# Patient Record
Sex: Female | Born: 1995 | Hispanic: Yes | Marital: Single | Smoking: Never smoker
Health system: Southern US, Community
[De-identification: ages and names within clinical notes are randomized; demographics above are authoritative.]

## PROBLEM LIST (undated history)

## (undated) ENCOUNTER — Inpatient Hospital Stay (HOSPITAL_COMMUNITY): Payer: Self-pay

## (undated) DIAGNOSIS — F209 Schizophrenia, unspecified: Secondary | ICD-10-CM

## (undated) DIAGNOSIS — E119 Type 2 diabetes mellitus without complications: Secondary | ICD-10-CM

## (undated) DIAGNOSIS — F32A Depression, unspecified: Secondary | ICD-10-CM

## (undated) DIAGNOSIS — J45909 Unspecified asthma, uncomplicated: Secondary | ICD-10-CM

## (undated) DIAGNOSIS — F4481 Dissociative identity disorder: Secondary | ICD-10-CM

## (undated) DIAGNOSIS — F419 Anxiety disorder, unspecified: Secondary | ICD-10-CM

## (undated) DIAGNOSIS — R519 Headache, unspecified: Secondary | ICD-10-CM

## (undated) DIAGNOSIS — L509 Urticaria, unspecified: Secondary | ICD-10-CM

## (undated) DIAGNOSIS — F329 Major depressive disorder, single episode, unspecified: Secondary | ICD-10-CM

## (undated) HISTORY — DX: Depression, unspecified: F32.A

## (undated) HISTORY — DX: Schizophrenia, unspecified: F20.9

## (undated) HISTORY — DX: Dissociative identity disorder: F44.81

## (undated) HISTORY — PX: WISDOM TOOTH EXTRACTION: SHX21

## (undated) HISTORY — DX: Urticaria, unspecified: L50.9

## (undated) HISTORY — PX: NO PAST SURGERIES: SHX2092

---
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
## 2018-11-17 ENCOUNTER — Other Ambulatory Visit: Payer: Self-pay

## 2018-11-17 ENCOUNTER — Encounter (HOSPITAL_COMMUNITY): Payer: Self-pay | Admitting: *Deleted

## 2018-11-17 ENCOUNTER — Emergency Department (HOSPITAL_COMMUNITY)
Admission: EM | Admit: 2018-11-17 | Discharge: 2018-11-17 | Disposition: A | Discharge status: Home / Self Care | Payer: Medicaid Other | Attending: Emergency Medicine | Admitting: Emergency Medicine

## 2018-11-17 DIAGNOSIS — E119 Type 2 diabetes mellitus without complications: Secondary | ICD-10-CM | POA: Insufficient documentation

## 2018-11-17 DIAGNOSIS — H60332 Swimmer's ear, left ear: Secondary | ICD-10-CM | POA: Diagnosis not present

## 2018-11-17 DIAGNOSIS — H9202 Otalgia, left ear: Secondary | ICD-10-CM | POA: Diagnosis present

## 2018-11-17 HISTORY — DX: Type 2 diabetes mellitus without complications: E11.9

## 2018-11-17 MED ORDER — OFLOXACIN 0.3 % OP SOLN
5.0000 [drp] | Freq: Every day | OPHTHALMIC | Status: DC
  Administered 2018-11-17: 5 [drp] via OTIC
  Filled 2018-11-17: qty 5

## 2018-11-17 MED ORDER — OFLOXACIN 0.3 % OP SOLN
5.0000 [drp] | Freq: Every day | OPHTHALMIC | Status: DC
  Filled 2018-11-17: qty 5

## 2018-11-17 NOTE — Discharge Instructions (Signed)
Use use 5 drops of ofloxacin in affected ear twice a day for the next 7 days. Use tylenol and ibuprofen for pain.

## 2018-11-17 NOTE — ED Triage Notes (Signed)
Left ear pain for several days.  

## 2018-11-17 NOTE — ED Provider Notes (Signed)
[facility] EMERGENCY DEPARTMENT Provider Note   CSN: [id number] Arrival date & time: 11/17/18  2120     History   Chief Complaint Chief Complaint  Patient presents with  . Otalgia    HPI Melissa Gilmore is a 23 y.o. female history of diabetes presents for 5 days of worsening left ear pain with decreased hearing and jaw pain.  Patient denies any otorrhea but states it hurts to touch her ear 2 days.  Patient states pain is a 6/10.  States this is more painful than prior cases of otitis externa but feels similar in quality.  Patient states pain is sharp and pulsatile in character worse with touching her ear and states that it hurts in the front of her ear as well.  Patient denies any discharge from your ear, recent swimming, Q-tip usage.  States she has had ear infections in the past that required eardrops felt similar.  Denies any recent antibiotic usage.  States her blood sugar is well controlled with metformin and she checked this morning blood sugar was 95.   Patient denies any fevers, chills, headache, pain behind her ear, nausea or vomiting.     HPI  Past Medical History:  Diagnosis Date  . Diabetes mellitus without complication ([place])     There are no active problems to display for this patient.   History reviewed. No pertinent surgical history.   OB History   No obstetric history on file.      Home Medications    Prior to Admission medications   Not on File    Family History No family history on file.  Social History Social History   Tobacco Use  . Smoking status: Never Smoker  Substance Use Topics  . Alcohol use: Not Currently  . Drug use: Not Currently     Allergies   Patient has no allergy information on record.   Review of Systems Review of Systems  All other systems reviewed and are negative.    Physical Exam Updated Vital Signs BP (!) 138/100   Pulse 85   Temp 99.1 F (37.3 C) (Oral)   Resp 20   SpO2 99%    Physical Exam Vitals signs and nursing note reviewed.  Constitutional:      General: She is not in acute distress. HENT:     Head: Normocephalic and atraumatic.     Right Ear: Tympanic membrane, ear canal and external ear normal.     Ears:     Comments: Left ear canal with edema, macerated skin and debris present.  TM intact without erythema.   Patient has tragal and pinna tenderness.  No mastoid tenderness to palpation or percussion.  No pre-or post auricular lymphadenopathy no cervical or supraclavicular lymphadenopathy.    Nose: Nose normal. No congestion.     Mouth/Throat:     Mouth: Mucous membranes are moist.     Pharynx: No oropharyngeal exudate or posterior oropharyngeal erythema.  Eyes:     General: No scleral icterus.       Right eye: No discharge.        Left eye: No discharge.     Conjunctiva/sclera: Conjunctivae normal.  Cardiovascular:     Rate and Rhythm: Normal rate and regular rhythm.  Pulmonary:     Effort: Pulmonary effort is normal.     Breath sounds: Normal breath sounds.  Abdominal:     Palpations: Abdomen is soft.     Tenderness: There is no abdominal tenderness.  Lymphadenopathy:     Cervical: No cervical adenopathy.  Skin:    General: Skin is warm and dry.  Neurological:     Mental Status: She is alert. Mental status is at baseline.  Psychiatric:        Mood and Affect: Mood normal.        Behavior: Behavior normal.      ED Treatments / Results  Labs (all labs ordered are listed, but only abnormal results are displayed) Labs Reviewed - No data to display  EKG None  Radiology No results found.  Procedures Procedures (including critical care time)  Medications Ordered in ED Medications - No data to display   Initial Impression / Assessment and Plan / ED Course  I have reviewed the triage vital signs and the nursing notes.  Pertinent labs & imaging results that were available during my care of the patient were reviewed by me and  considered in my medical decision making (see chart for details).       Patient is 23 year old female with history of diabetes presenting for left ear pain.  Patient has acute otitis externa of the left ear placed ear wick and administered ofloxacin eardrops.  Prescribed ofloxacin 5 drops twice daily for the next 7 days instructed use Tylenol ibuprofen for pain.  Also patient on avoiding Q-tips and swimming for the next week.  Ear hygiene discussed.  Patient does not appear to have malignant otitis externa or systemic symptoms of infection.  Patient will follow-up with primary care in the next week.  Will return for any new or concerning symptoms discussed strict return precautions patient understands nature of disease all is answered.      The patient appears reasonably screened and/or stabilized for discharge and I doubt any other medical condition or other [facility] requiring further screening, evaluation, or treatment in the ED at this time prior to discharge.  Patient is hemodynamically stable, in NAD, and able to ambulate in the ED. Pain has been managed or a plan has been made for home management and has no complaints prior to discharge. Patient is comfortable with above plan and is stable for discharge at this time. All questions were answered prior to disposition. Results from the ER workup discussed with the patient face to face and all questions answered to the best of my ability. The patient is safe for discharge with strict return precautions. Patient appears safe for discharge with appropriate follow-up.  Conveyed my impression with the patient and he voiced understanding and is agreeable to plan.   An After Visit Summary was printed and given to the patient.  Portions of this note were generated with Scientist, clinical (histocompatibility and immunogenetics). Dictation errors may occur despite best attempts at proofreading.    Final Clinical Impressions(s) / ED Diagnoses   Final diagnoses:  None    ED Discharge  Orders    None       Gailen Shelter, [place] 11/17/18 2319    Virgina Norfolk, DO 11/18/18 0007

## 2018-11-28 ENCOUNTER — Other Ambulatory Visit: Payer: Self-pay | Admitting: Cardiology

## 2018-11-28 DIAGNOSIS — Z20822 Contact with and (suspected) exposure to covid-19: Secondary | ICD-10-CM

## 2018-11-30 LAB — NOVEL CORONAVIRUS, NAA: SARS-CoV-2, NAA: DETECTED — AB

## 2018-12-02 ENCOUNTER — Telehealth: Payer: Self-pay | Admitting: Critical Care Medicine

## 2018-12-02 NOTE — Telephone Encounter (Signed)
I connected with this patient who is having only symptoms of fatigue.  The symptoms began on 21 October which is when she had her Covid test.  The patient understands she must stay in isolation through October 31.  She knows the health department may be in touch with her.  She had no other questions or concerns

## 2019-07-02 ENCOUNTER — Other Ambulatory Visit: Payer: Self-pay

## 2019-07-02 ENCOUNTER — Encounter: Payer: Self-pay | Admitting: Nurse Practitioner

## 2019-07-02 ENCOUNTER — Ambulatory Visit: Payer: Medicaid Other | Attending: Nurse Practitioner | Admitting: Nurse Practitioner

## 2019-07-02 VITALS — Ht 64.0 in | Wt 220.0 lb

## 2019-07-02 DIAGNOSIS — Z7984 Long term (current) use of oral hypoglycemic drugs: Secondary | ICD-10-CM | POA: Diagnosis not present

## 2019-07-02 DIAGNOSIS — Z7689 Persons encountering health services in other specified circumstances: Secondary | ICD-10-CM

## 2019-07-02 DIAGNOSIS — N760 Acute vaginitis: Secondary | ICD-10-CM | POA: Insufficient documentation

## 2019-07-02 DIAGNOSIS — F449 Dissociative and conversion disorder, unspecified: Secondary | ICD-10-CM | POA: Diagnosis not present

## 2019-07-02 DIAGNOSIS — E1165 Type 2 diabetes mellitus with hyperglycemia: Secondary | ICD-10-CM | POA: Diagnosis not present

## 2019-07-02 MED ORDER — METFORMIN HCL 500 MG PO TABS: 500.0000 mg | ORAL_TABLET | Freq: Two times a day (BID) | ORAL | 3 refills | Status: DC

## 2019-07-02 MED ORDER — MONISTAT 1 COMBO PACK 1200 & 2 MG & % VA KIT: 1.0000 | PACK | Freq: Once | VAGINAL | 0 refills | Status: AC

## 2019-07-02 NOTE — Progress Notes (Signed)
Virtual Visit via Telephone Note Due to national recommendations of social distancing due to COVID 19, telehealth visit is felt to be most appropriate for this patient at this time.  I discussed the limitations, risks, security and privacy concerns of performing an evaluation and management service by telephone and the availability of in person appointments. I also discussed with the patient that there may be a patient responsible charge related to this service. The patient expressed understanding and agreed to proceed.    I connected with Melissa Gilmore on 07/02/19  at   1:50 PM EDT  EDT by telephone and verified that I am speaking with the correct person using two identifiers.   Consent I discussed the limitations, risks, security and privacy concerns of performing an evaluation and management service by telephone and the availability of in person appointments. I also discussed with the patient that there may be a patient responsible charge related to this service. The patient expressed understanding and agreed to proceed.   Location of Patient: Private Residence   Location of Provider: [facility]    Persons participating in Telemedicine visit:   FNP-BC YY Bien CMA Annalaya Gilmore    History of Present Illness: Telemedicine visit for: Establish Care  Moved from [place] September 28th.   DM TYPE 2 Diagnosed with DM in 2018. Currently taking metformin 500 mg BID.  She is unable to recall any recent A1c readings.   BMI of 37.7.  She had previously been on Victoza which caused severe abdominal pain and this was stopped by her PCP. She lost her meter during her travel from [place]. Had previously been checking twice per day.  Average reading 130-1 70s.  Vaginitis Endorses vaginal irritation. Onset 2 weeks ago. Previously had dysuria which has now resolved however vaginitis symptoms of itching and irritation are still present.  She is not  sexually active.  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  States her disorder sometimes causes her to revert to a 6-year-old state and a an adult needs to be present.     Past Medical History:  Diagnosis Date  . Depression   . Diabetes mellitus without complication (HCC)   . 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HCC)   . Schizophrenia (HCC)     Past Surgical History:  Procedure Laterality Date  . NO PAST SURGERIES      Family History  Problem Relation Age of Onset  . Diabetes Maternal Grandmother   . Stomach cancer Maternal Grandmother     Social History   Socioeconomic History  . Marital status: Single    Spouse name: Not on file  . Number of children: Not on file  . Years of education: Not on file  . Highest education level: Not on file  Occupational History  . Not on file  Tobacco Use  . Smoking status: Never Smoker  . Smokeless tobacco: Never Used  Substance and Sexual Activity  . Alcohol use: Not Currently  . Drug use: Not Currently  . Sexual activity: Not Currently  Other Topics Concern  . Not on file  Social History Narrative  . Not on file   Social Determinants of Health   Financial Resource Strain:   . Difficulty of Paying Living Expenses:   Food Insecurity:   . Worried About Running Out of Food in the Last Year:   . Ran Out of Food in the Last Year:   Transportation   Needs:   . Lack of Transportation (Medical):   . Lack of Transportation (Non-Medical):   Physical Activity:   . Days of Exercise per Week:   . Minutes of Exercise per Session:   Stress:   . Feeling of Stress :   Social Connections:   . Frequency of Communication with Friends and Family:   . Frequency of Social Gatherings with Friends and Family:   . Attends Religious Services:   . Active Member of Clubs or Organizations:   . Attends Club or Organization Meetings:   . Marital Status:      Observations/Objective: Awake, alert  and oriented x 3   Review of Systems  Constitutional: Negative for fever, malaise/fatigue and weight loss.  HENT: Negative.  Negative for nosebleeds.   Eyes: Negative.  Negative for blurred vision, double vision and photophobia.  Respiratory: Negative.  Negative for cough and shortness of breath.   Cardiovascular: Negative.  Negative for chest pain, palpitations and leg swelling.  Gastrointestinal: Negative.  Negative for heartburn, nausea and vomiting.  Genitourinary: Negative for dysuria, flank pain, frequency, hematuria and urgency.       See HPI  Musculoskeletal: Negative.  Negative for myalgias.  Neurological: Negative.  Negative for dizziness, focal weakness, seizures and headaches.  Psychiatric/Behavioral: Negative for suicidal ideas.       See HPI    Assessment and Plan: [name] was seen today for new patient (initial visit).  Diagnoses and all orders for this visit:  Encounter to establish care  Type 2 diabetes mellitus with hyperglycemia, without long-term current use of insulin (HCC) -     metFORMIN (GLUCOPHAGE) 500 MG tablet; Take 1 tablet (500 mg total) by mouth 2 (two) times daily with a meal. -     Microalbumin / creatinine urine ratio; Future -     Hemoglobin A1c; Future -     CMP14+EGFR; Future Continue blood sugar control as discussed in office today, low carbohydrate diet, and regular physical exercise as tolerated, 150 minutes per week (30 min each day, 5 days per week, or 50 min 3 days per week). Keep blood sugar logs with fasting goal of 90-130 mg/dl, post prandial (after you eat) less than 180.  For Hypoglycemia: BS <60 and Hyperglycemia BS >400; contact the clinic ASAP. Annual eye exams and foot exams are recommended.   Acute vaginitis -     Cancel: Cervicovaginal ancillary only -     miconazole (MONISTAT 1 COMBO PACK) kit; Place 1 each vaginally once for 1 dose. -     Cervicovaginal ancillary only; Future  Dissociative disorder Follow-up with  behavioral health as previously planned  Follow Up Instructions Return in about 3 months (around 10/02/2019).     I discussed the assessment and treatment plan with the patient. The patient was provided an opportunity to ask questions and all were answered. The patient agreed with the plan and demonstrated an understanding of the instructions.   The patient was advised to call back or seek an in-person evaluation if the symptoms worsen or if the condition fails to improve as anticipated.  I provided 16 minutes of non-face-to-face time during this encounter including median intraservice time, reviewing previous notes, labs, imaging, medications and explaining diagnosis and management.   W , FNP-BC 

## 2019-07-09 ENCOUNTER — Other Ambulatory Visit (HOSPITAL_COMMUNITY)
Admission: RE | Admit: 2019-07-09 | Discharge: 2019-07-09 | Disposition: A | Discharge status: Home / Self Care | Payer: Medicaid Other | Source: Ambulatory Visit | Attending: Nurse Practitioner | Admitting: Nurse Practitioner

## 2019-07-09 ENCOUNTER — Other Ambulatory Visit: Payer: Self-pay

## 2019-07-09 ENCOUNTER — Ambulatory Visit: Payer: Medicaid Other | Attending: Nurse Practitioner

## 2019-07-09 DIAGNOSIS — E1165 Type 2 diabetes mellitus with hyperglycemia: Secondary | ICD-10-CM

## 2019-07-09 DIAGNOSIS — N760 Acute vaginitis: Secondary | ICD-10-CM

## 2019-07-10 LAB — CMP14+EGFR
ALT: 80 IU/L — ABNORMAL HIGH (ref 0–32)
AST: 49 IU/L — ABNORMAL HIGH (ref 0–40)
Albumin/Globulin Ratio: 1.6 (ref 1.2–2.2)
Albumin: 4.4 g/dL (ref 3.9–5.0)
Alkaline Phosphatase: 103 IU/L (ref 48–121)
BUN/Creatinine Ratio: 13 (ref 9–23)
BUN: 9 mg/dL (ref 6–20)
Bilirubin Total: 0.5 mg/dL (ref 0.0–1.2)
CO2: 25 mmol/L (ref 20–29)
Calcium: 9.9 mg/dL (ref 8.7–10.2)
Chloride: 95 mmol/L — ABNORMAL LOW (ref 96–106)
Creatinine, Ser: 0.72 mg/dL (ref 0.57–1.00)
GFR calc Af Amer: 137 mL/min/{1.73_m2} (ref >59)
GFR calc non Af Amer: 119 mL/min/{1.73_m2} (ref >59)
Globulin, Total: 2.7 g/dL (ref 1.5–4.5)
Glucose: 481 mg/dL — ABNORMAL HIGH (ref 65–99)
Potassium: 3.9 mmol/L (ref 3.5–5.2)
Sodium: 134 mmol/L (ref 134–144)
Total Protein: 7.1 g/dL (ref 6.0–8.5)

## 2019-07-10 LAB — MICROALBUMIN / CREATININE URINE RATIO
Creatinine, Urine: 9.1 mg/dL
Microalbumin, Urine: 10.2 ug/mL

## 2019-07-10 LAB — CERVICOVAGINAL ANCILLARY ONLY
Bacterial Vaginitis (gardnerella): NEGATIVE
Candida Glabrata: POSITIVE — AB
Candida Vaginitis: POSITIVE — AB
Chlamydia: NEGATIVE
Comment: NEGATIVE
Comment: NEGATIVE
Comment: NEGATIVE
Comment: NEGATIVE
Comment: NEGATIVE
Comment: NORMAL
Neisseria Gonorrhea: NEGATIVE
Trichomonas: NEGATIVE

## 2019-07-10 LAB — HEMOGLOBIN A1C
Est. average glucose Bld gHb Est-mCnc: 275 mg/dL
Hgb A1c MFr Bld: 11.2 % — ABNORMAL HIGH (ref 4.8–5.6)

## 2019-07-12 ENCOUNTER — Other Ambulatory Visit: Payer: Self-pay | Admitting: Nurse Practitioner

## 2019-07-12 DIAGNOSIS — E1165 Type 2 diabetes mellitus with hyperglycemia: Secondary | ICD-10-CM

## 2019-07-12 MED ORDER — ACCU-CHEK GUIDE VI STRP: ORAL_STRIP | 12 refills | Status: DC

## 2019-07-12 MED ORDER — VICTOZA 18 MG/3ML ~~LOC~~ SOPN: PEN_INJECTOR | SUBCUTANEOUS | 6 refills | Status: DC

## 2019-07-12 MED ORDER — BD PEN NEEDLE MINI U/F 31G X 5 MM MISC: 3 refills | Status: DC

## 2019-07-12 MED ORDER — ACCU-CHEK SOFTCLIX LANCETS MISC: 3 refills | Status: DC

## 2019-07-12 MED ORDER — LISINOPRIL 5 MG PO TABS: 5.0000 mg | ORAL_TABLET | Freq: Every day | ORAL | 3 refills | Status: DC

## 2019-07-12 MED ORDER — ACCU-CHEK GUIDE ME W/DEVICE KIT: 1.0000 | PACK | Freq: Once | 0 refills | Status: AC

## 2019-07-12 MED ORDER — FLUCONAZOLE 150 MG PO TABS: 150.0000 mg | ORAL_TABLET | Freq: Once | ORAL | 1 refills | Status: AC

## 2019-07-29 ENCOUNTER — Other Ambulatory Visit: Payer: Self-pay

## 2019-07-29 ENCOUNTER — Encounter: Payer: Self-pay | Admitting: Nurse Practitioner

## 2019-07-29 ENCOUNTER — Ambulatory Visit: Payer: Medicaid Other | Attending: Nurse Practitioner | Admitting: Nurse Practitioner

## 2019-07-29 VITALS — BP 126/87 | Temp 97.7°F | Ht 65.5 in | Wt 237.0 lb

## 2019-07-29 DIAGNOSIS — Z79899 Other long term (current) drug therapy: Secondary | ICD-10-CM | POA: Insufficient documentation

## 2019-07-29 DIAGNOSIS — Z794 Long term (current) use of insulin: Secondary | ICD-10-CM | POA: Insufficient documentation

## 2019-07-29 DIAGNOSIS — Z1159 Encounter for screening for other viral diseases: Secondary | ICD-10-CM | POA: Diagnosis not present

## 2019-07-29 DIAGNOSIS — F209 Schizophrenia, unspecified: Secondary | ICD-10-CM | POA: Diagnosis not present

## 2019-07-29 DIAGNOSIS — N939 Abnormal uterine and vaginal bleeding, unspecified: Secondary | ICD-10-CM | POA: Diagnosis not present

## 2019-07-29 DIAGNOSIS — E1165 Type 2 diabetes mellitus with hyperglycemia: Secondary | ICD-10-CM | POA: Insufficient documentation

## 2019-07-29 DIAGNOSIS — Z833 Family history of diabetes mellitus: Secondary | ICD-10-CM | POA: Diagnosis not present

## 2019-07-29 DIAGNOSIS — Z114 Encounter for screening for human immunodeficiency virus [HIV]: Secondary | ICD-10-CM | POA: Diagnosis not present

## 2019-07-29 LAB — GLUCOSE, POCT (MANUAL RESULT ENTRY): POC Glucose: 197 mg/dl — AB (ref 70–99)

## 2019-07-29 LAB — POCT URINE PREGNANCY: Preg Test, Ur: NEGATIVE

## 2019-07-29 MED ORDER — NORGESTIM-ETH ESTRAD TRIPHASIC 0.18/0.215/0.25 MG-35 MCG PO TABS: 1.0000 | ORAL_TABLET | Freq: Every day | ORAL | 11 refills | Status: DC

## 2019-07-29 MED ORDER — TOUJEO MAX SOLOSTAR 300 UNIT/ML ~~LOC~~ SOPN: 5.0000 [IU] | PEN_INJECTOR | Freq: Every day | SUBCUTANEOUS | 6 refills | Status: DC

## 2019-07-29 NOTE — Progress Notes (Signed)
Assessment & Plan:  Melissa Gilmore was seen today for medication problem.  Diagnoses and all orders for this visit:  Type 2 diabetes mellitus with hyperglycemia, without long-term current use of insulin (HCC) -     Glucose (CBG) -     insulin glargine, 2 Unit Dial, (TOUJEO MAX SOLOSTAR) 300 UNIT/ML Solostar Pen; Inject 5 Units into the skin at bedtime. Continue blood sugar control as discussed in office today, low carbohydrate diet, and regular physical exercise as tolerated, 150 minutes per week (30 min each day, 5 days per week, or 50 min 3 days per week). Keep blood sugar logs with fasting goal of 90-130 mg/dl, post prandial (after you eat) less than 180.  For Hypoglycemia: BS <60 and Hyperglycemia BS >400; contact the clinic ASAP. Annual eye exams and foot exams are recommended.   Diabetes is poorly controlled. Advised patient to keep a fasting blood sugar log fast, 2 hours post lunch and bedtime which will be reviewed at the next office visit.  Abnormal uterine bleeding (AUB) -     Thyroid Panel With TSH -     Norgestimate-Ethinyl Estradiol Triphasic (ORTHO TRI-CYCLEN, 28,) 0.18/0.215/0.25 MG-35 MCG tablet; Take 1 tablet by mouth daily. -     POCT urine pregnancy  Need for hepatitis C screening test -     Hepatitis C Antibody  Encounter for screening for HIV -     HIV antibody (with reflex)    Patient has been counseled on age-appropriate routine health concerns for screening and prevention. These are reviewed and up-to-date. Referrals have been placed accordingly. Immunizations are up-to-date or declined.    Subjective:   Chief Complaint  Patient presents with  . Medication Problem    Pt. stated the medication Metformin give her stomach pain and diarhhea.    HPI Melissa Gilmore 24 y.o. female presents to office today for follow up today.   DM TYPE 2 Could not tolerate the metformin due to GI upset. Only taking victoza at this time. Currently at 0.6mg  daily. She is aware  that she will increase weekly until she achieves the final dose of 1.8 mg daily. Will also start Toujeo as she continues to endorse readings in the upper 200s postprandial and fasting.  Lab Results  Component Value Date   HGBA1C 11.2 (H) 07/09/2019    AUB Patient describes symptoms of  metrorrhagia with periods sometimes only occuring a few times per year. She has been on OCPs in the past which helped to regulate her cycle. She is not sexually active.  First menstrual cycle at the age of 26. Her only 2 menstrual cycles this year were in March and April. Both periods lasting 4-5 days.   Review of Systems  Constitutional: Negative for fever, malaise/fatigue and weight loss.  HENT: Negative.  Negative for nosebleeds.   Eyes: Negative.  Negative for blurred vision, double vision and photophobia.  Respiratory: Negative.  Negative for cough and shortness of breath.   Cardiovascular: Negative.  Negative for chest pain, palpitations and leg swelling.  Gastrointestinal: Negative.  Negative for heartburn, nausea and vomiting.  Genitourinary:       AUB  Musculoskeletal: Negative.  Negative for myalgias.  Neurological: Negative.  Negative for dizziness, focal weakness, seizures and headaches.  Psychiatric/Behavioral: Positive for depression. Negative for suicidal ideas.    Past Medical History:  Diagnosis Date  . Depression   . Diabetes mellitus without complication (HCC)   . 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HCC)   . Schizophrenia (HCC)  Past Surgical History:  Procedure Laterality Date  . NO PAST SURGERIES      Family History  Problem Relation Age of Onset  . Diabetes Maternal Grandmother   . Stomach cancer Maternal Grandmother     Social History Reviewed with no changes to be made today.   Outpatient Medications Prior to Visit  Medication Sig Dispense Refill  . Accu-Chek Softclix Lancets lancets Use as instructed. Inject into the skin once daily E11.65 100 each 3  . glucose  blood (ACCU-CHEK GUIDE) test strip Use as instructed. Check blood glucose by fingerstick twice per day. 100 each 12  . Insulin Pen Needle (B-D UF III MINI PEN NEEDLES) 31G X 5 MM MISC Use as instructed. Inject into the skin once daily 100 each 3  . liraglutide (VICTOZA) 18 MG/3ML SOPN SubQ:  Inject 0.6 mg once daily into the skin. Week 2: increase to 1.2 mg once daily; week 3: increase to 1.8 mg once daily 3 pen 6  . lisinopril (ZESTRIL) 5 MG tablet Take 1 tablet (5 mg total) by mouth daily. 90 tablet 3  . metFORMIN (GLUCOPHAGE) 500 MG tablet Take 1 tablet (500 mg total) by mouth 2 (two) times daily with a meal. 60 tablet 3   No facility-administered medications prior to visit.    No Known Allergies     Objective:    BP 126/87 (BP Location: Left Arm, Patient Position: Sitting)   Temp 97.7 F (36.5 C) (Temporal)   Ht 5' 5.5" (1.664 m)   Wt 237 lb (107.5 kg)   LMP 06/30/2019   BMI 38.84 kg/m  Wt Readings from Last 3 Encounters:  07/29/19 237 lb (107.5 kg)  07/02/19 220 lb (99.8 kg)    Physical Exam Vitals and nursing note reviewed.  Constitutional:      Appearance: She is well-developed. She is obese. She is not ill-appearing.  HENT:     Head: Normocephalic and atraumatic.  Cardiovascular:     Rate and Rhythm: Normal rate and regular rhythm.     Heart sounds: Normal heart sounds. No murmur heard.  No friction rub. No gallop.   Pulmonary:     Effort: Pulmonary effort is normal. No tachypnea or respiratory distress.     Breath sounds: Normal breath sounds. No decreased breath sounds, wheezing, rhonchi or rales.  Chest:     Chest wall: No tenderness.  Abdominal:     General: Bowel sounds are normal.     Palpations: Abdomen is soft.  Musculoskeletal:        General: Normal range of motion.     Cervical back: Normal range of motion.  Skin:    General: Skin is warm and dry.  Neurological:     Mental Status: She is alert and oriented to person, place, and time.      Coordination: Coordination normal.  Psychiatric:        Behavior: Behavior normal. Behavior is cooperative.        Thought Content: Thought content normal.        Judgment: Judgment normal.          Patient has been counseled extensively about nutrition and exercise as well as the importance of adherence with medications and regular follow-up. The patient was given clear instructions to go to ER or return to medical center if symptoms don't improve, worsen or new problems develop. The patient verbalized understanding.   Follow-up: Return in about 6 weeks (around 09/09/2019) for METER CHECK WITH [facility] then see  me in September.   Gildardo Pounds, FNP-BC [facility] and [place], [place]   07/29/2019, 2:29 PM

## 2019-07-30 LAB — THYROID PANEL WITH TSH
Free Thyroxine Index: 1.7 (ref 1.2–4.9)
T3 Uptake Ratio: 24 % (ref 24–39)
T4, Total: 7.2 ug/dL (ref 4.5–12.0)
TSH: 1.51 u[IU]/mL (ref 0.450–4.500)

## 2019-07-30 LAB — HEPATITIS C ANTIBODY: Hep C Virus Ab: <0.1 s/co ratio (ref 0.0–0.9)

## 2019-07-30 LAB — HIV ANTIBODY (ROUTINE TESTING W REFLEX): HIV Screen 4th Generation wRfx: NONREACTIVE

## 2019-08-22 ENCOUNTER — Ambulatory Visit
Admission: RE | Admit: 2019-08-22 | Discharge: 2019-08-22 | Disposition: A | Discharge status: Home / Self Care | Payer: Medicaid Other | Source: Ambulatory Visit | Attending: Emergency Medicine | Admitting: Emergency Medicine

## 2019-08-22 ENCOUNTER — Other Ambulatory Visit: Payer: Self-pay

## 2019-08-22 VITALS — BP 125/88 | HR 107 | Temp 98.6°F | Resp 18

## 2019-08-22 DIAGNOSIS — H60392 Other infective otitis externa, left ear: Secondary | ICD-10-CM | POA: Diagnosis not present

## 2019-08-22 MED ORDER — OFLOXACIN 0.3 % OP SOLN: 4.0000 [drp] | Freq: Three times a day (TID) | OPHTHALMIC | 0 refills | Status: AC

## 2019-08-22 NOTE — ED Triage Notes (Signed)
Pt c/o left ear pain x 4 weeks, states pillow has drainage on it when she wakes up

## 2019-08-22 NOTE — Discharge Instructions (Signed)
Use eardrops as prescribed for the next week. Return for worsening ear pain, swelling, discharge, bleeding, decreased hearing, development of jaw pain/swelling, fever.  Do NOT use Q-tips as these can cause your ear wax to get stuck, the tips may break off and become a foreign body requiring additional medical care, or puncture your eardrum.  Helpful prevention tip: Use a solution of equal parts isopropyl (rubbing) alcohol and white vinegar (acetic acid) in both ears after swimming. 

## 2019-08-23 NOTE — ED Provider Notes (Signed)
[facility]    CSN: [id number] Arrival date & time: 08/22/19  1854      History   Chief Complaint Chief Complaint  Patient presents with  . Otalgia    HPI Melissa Gilmore is a 24 y.o. female with history of schizophrenia, diabetes presenting for left ear pain x4 weeks.  States she has noted nonbloody drainage.  No change in hearing, trauma, fever.   Past Medical History:  Diagnosis Date  . Depression   . Diabetes mellitus without complication (HCC)   . 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HCC)   . Schizophrenia (HCC)     There are no problems to display for this patient.   Past Surgical History:  Procedure Laterality Date  . NO PAST SURGERIES      OB History   No obstetric history on file.      Home Medications    Prior to Admission medications   Medication Sig Start Date End Date Taking? Authorizing Provider  Accu-Chek Softclix Lancets lancets Use as instructed. Inject into the skin once daily E11.65 07/12/19   Claiborne Rigg, NP  glucose blood (ACCU-CHEK GUIDE) test strip Use as instructed. Check blood glucose by fingerstick twice per day. 07/12/19   Claiborne Rigg, NP  insulin glargine, 2 Unit Dial, (TOUJEO MAX SOLOSTAR) 300 UNIT/ML Solostar Pen Inject 5 Units into the skin at bedtime. 07/29/19 10/27/19  Claiborne Rigg, NP  Insulin Pen Needle (B-D UF III MINI PEN NEEDLES) 31G X 5 MM MISC Use as instructed. Inject into the skin once daily 07/12/19   Claiborne Rigg, NP  liraglutide (VICTOZA) 18 MG/3ML SOPN SubQ:  Inject 0.6 mg once daily into the skin. Week 2: increase to 1.2 mg once daily; week 3: increase to 1.8 mg once daily 07/12/19   Claiborne Rigg, NP  lisinopril (ZESTRIL) 5 MG tablet Take 1 tablet (5 mg total) by mouth daily. 07/12/19   Claiborne Rigg, NP  Norgestimate-Ethinyl Estradiol Triphasic (ORTHO TRI-CYCLEN, 28,) 0.18/0.215/0.25 MG-35 MCG tablet Take 1 tablet by mouth daily. 07/29/19   Claiborne Rigg, NP  ofloxacin (OCUFLOX) 0.3 %  ophthalmic solution Place 4 drops into the left ear in the morning, at noon, and at bedtime for 7 days. 08/22/19 08/29/19  Hall-Potvin, [place], PA-C    Family History Family History  Problem Relation Age of Onset  . Diabetes Maternal Grandmother   . Stomach cancer Maternal Grandmother     Social History Social History   Tobacco Use  . Smoking status: Never Smoker  . Smokeless tobacco: Never Used  Substance Use Topics  . Alcohol use: Not Currently  . Drug use: Not Currently     Allergies   Patient has no known allergies.   Review of Systems As per HPI   Physical Exam Triage Vital Signs ED Triage Vitals  Enc Vitals Group     BP 08/22/19 1911 125/88     Pulse Rate 08/22/19 1911 (!) 107     Resp 08/22/19 1911 18     Temp 08/22/19 1911 98.6 F (37 C)     Temp src --      SpO2 08/22/19 1911 97 %     Weight --      Height --      Head Circumference --      Peak Flow --      Pain Score 08/22/19 1909 7     Pain Loc --      Pain Edu? --  Excl. in GC? --    No data found.  Updated Vital Signs BP 125/88   Pulse (!) 107   Temp 98.6 F (37 C)   Resp 18   LMP 08/22/2019   SpO2 97%   Visual Acuity Right Eye Distance:   Left Eye Distance:   Bilateral Distance:    Right Eye Near:   Left Eye Near:    Bilateral Near:     Physical Exam Constitutional:      General: She is not in acute distress. HENT:     Head: Normocephalic and atraumatic.     Jaw: There is normal jaw occlusion. No tenderness or pain on movement.     Right Ear: Hearing, tympanic membrane, ear canal and external ear normal. No tenderness. No mastoid tenderness.     Left Ear: Hearing and tympanic membrane normal. No tenderness. No mastoid tenderness.     Ears:     Comments: Left ear with positive tragal tenderness and EAC swelling, discharge.    Nose: No nasal deformity, septal deviation or nasal tenderness.     Right Turbinates: Not swollen or pale.     Left Turbinates: Not swollen or  pale.     Right Sinus: No maxillary sinus tenderness or frontal sinus tenderness.     Left Sinus: No maxillary sinus tenderness or frontal sinus tenderness.     Mouth/Throat:     Lips: Pink. No lesions.     Mouth: Mucous membranes are moist. No injury.     Pharynx: Oropharynx is clear. Uvula midline. No posterior oropharyngeal erythema or uvula swelling.     Comments: no tonsillar exudate or hypertrophy Cardiovascular:     Rate and Rhythm: Normal rate.  Pulmonary:     Effort: Pulmonary effort is normal.  Musculoskeletal:     Cervical back: Normal range of motion and neck supple. No muscular tenderness.  Lymphadenopathy:     Cervical: No cervical adenopathy.  Neurological:     Mental Status: She is alert and oriented to person, place, and time.      UC Treatments / Results  Labs (all labs ordered are listed, but only abnormal results are displayed) Labs Reviewed - No data to display  EKG   Radiology No results found.  Procedures Procedures (including critical care time)  Medications Ordered in UC Medications - No data to display  Initial Impression / Assessment and Plan / UC Course  I have reviewed the triage vital signs and the nursing notes.  Pertinent labs & imaging results that were available during my care of the patient were reviewed by me and considered in my medical decision making (see chart for details).     H&P consistent with otitis externa.  Will start ofloxacin as outlined below.  Patient has tolerated this well in the past.  Return precautions discussed, pt verbalized understanding and is agreeable to plan. Final Clinical Impressions(s) / UC Diagnoses   Final diagnoses:  Infective otitis externa of left ear     Discharge Instructions     Use eardrops as prescribed for the next week. Return for worsening ear pain, swelling, discharge, bleeding, decreased hearing, development of jaw pain/swelling, fever.  Do NOT use Q-tips as these can cause your  ear wax to get stuck, the tips may break off and become a foreign body requiring additional medical care, or puncture your eardrum.  Helpful prevention tip: Use a solution of equal parts isopropyl (rubbing) alcohol and white vinegar (acetic acid) in both ears  after swimming.    ED Prescriptions    Medication Sig Dispense Auth. Provider   ofloxacin (OCUFLOX) 0.3 % ophthalmic solution Place 4 drops into the left ear in the morning, at noon, and at bedtime for 7 days. 5 mL Hall-Potvin, [place], PA-C     PDMP not reviewed this encounter.   Odette Fraction [place], [place] 08/23/19 [phone]

## 2019-09-02 ENCOUNTER — Ambulatory Visit: Payer: Medicaid Other | Admitting: Pharmacist

## 2019-09-03 ENCOUNTER — Ambulatory Visit: Payer: Medicaid Other | Attending: Family Medicine | Admitting: Pharmacist

## 2019-09-03 ENCOUNTER — Encounter: Payer: Self-pay | Admitting: Pharmacist

## 2019-09-03 ENCOUNTER — Other Ambulatory Visit: Payer: Self-pay

## 2019-09-03 DIAGNOSIS — E1165 Type 2 diabetes mellitus with hyperglycemia: Secondary | ICD-10-CM

## 2019-09-03 LAB — GLUCOSE, POCT (MANUAL RESULT ENTRY): POC Glucose: 303 mg/dl — AB (ref 70–99)

## 2019-09-03 MED ORDER — LANTUS SOLOSTAR 100 UNIT/ML ~~LOC~~ SOPN: 10.0000 [IU] | PEN_INJECTOR | Freq: Every day | SUBCUTANEOUS | 2 refills | Status: DC

## 2019-09-03 NOTE — Progress Notes (Signed)
    S:     PCP: Bertram Denver, NP  Patient arrives in good spirits. Presents for diabetes evaluation, education, and management. Patient was referred and last seen by Primary Care Provider on 07/29/19. At that visit, patient started on Toujeo 5 units daily and continued on Victoza. Patient could not tolerate metformin due to GI upset.    Patient reports diabetes was diagnosed in 2018.   Today, patient reports she has not picked up Toujeo from the pharmacy due to insurance coverage issues, and has missed 3 doses of Victoza 1.8 mg within the last month including last night's dose; denies any side-effects. Reports sugars at home have been higher in the morning (120-190s fasting) and 150, 160 in the evenings.    Family/Social History: -FHx: Diabetes (MGP) -Tobacco user: never  Merchant navy officer affordability: Medicaid  Current diabetes medications include: Toujeo 5 units at bedtime (not taking), Victoza 1.8 mg daily; cannot tolerate metformin d/t GI side effects.  Current hyperlipidemia medications include: none  Patient denies hypoglycemic events.  Patient reported dietary habits: Eats 4 tortillas (down from 8 daily), rice, fruits, not a big of vegetables, occasionally eats red meats; drinks water, rarely drink soda, sometimes juice  Patient-reported exercise habits: not currently    Patient denies nocturia (nighttime urination).  Patient reports neuropathy (nerve pain). Patient denies visual changes.  O:  POCT: 303 (post-prandial)  Lab Results  Component Value Date   HGBA1C 11.2 (H) 07/09/2019   There were no vitals filed for this visit.  Lipid Panel  No results found for: CHOL, TRIG, HDL, CHOLHDL, VLDL, LDLCALC, LDLDIRECT  Home fasting blood sugars: 120-190s  2 hour post-meal/random blood sugars: 150, 160.  Clinical Atherosclerotic Cardiovascular Disease (ASCVD): No  The ASCVD Risk score Denman George DC Jr., et al., 2013) failed to calculate for the following  reasons:   The 2013 ASCVD risk score is only valid for ages 51 to 76   A/P: Diabetes longstanding currently uncontrolled with A1C 11.2%. Control is suboptimal due to medication non-adherence. Post-prandial POCT elevated at 303 mg/dL (missed victoza dose last night). However, home sugars have improved but are still above goal (not on Toujeo). Additionally, Toujeo is a non-preferred medication within medicaid formulary; will switch to Lantus (preferred). Discussed proper administration technique.   -Discontinued Toujeo -Started Lantus 10 units daily -Continue Victoza 1.8 mg daily -Extensively discussed pathophysiology of diabetes, recommended lifestyle interventions, dietary effects on blood sugar control -Counseled on s/sx of and management of hypoglycemia -Next A1C anticipated 10/2019.   Written patient instructions provided. Total time in face to face counseling 25 minutes.   Follow up Pharmacist Clinic Visit in 2 weeks.     Fabio Neighbors, PharmD PGY2 Ambulatory Care Resident [facility]  [facility]arm[facility]

## 2019-09-17 ENCOUNTER — Other Ambulatory Visit: Payer: Self-pay

## 2019-09-17 ENCOUNTER — Encounter: Payer: Self-pay | Admitting: Pharmacist

## 2019-09-17 ENCOUNTER — Ambulatory Visit: Payer: Medicaid Other | Attending: Nurse Practitioner | Admitting: Pharmacist

## 2019-09-17 DIAGNOSIS — Z794 Long term (current) use of insulin: Secondary | ICD-10-CM | POA: Diagnosis not present

## 2019-09-17 DIAGNOSIS — E1165 Type 2 diabetes mellitus with hyperglycemia: Secondary | ICD-10-CM

## 2019-09-17 NOTE — Progress Notes (Signed)
    S:     PCP: Bertram Denver, NP  Patient arrives in good spirits. Presents for diabetes evaluation, education, and management. Patient was referred and last seen by Primary Care Provider on 07/29/19. Pharmacy saw her 09/03/2019 and stopped Toujeo. Lantus was started d/t insurance preference.   Patient reports diabetes was diagnosed in 2018.   Today, patient reports that she is taking her medications daily. Denies missed doses since last visit. She reports that her mental health provider started an injectable medication last week. She is unable to name the medication but states she receives this monthly. Unfortunately, she developed insomnia and has not been checking her sugars this week.   Family/Social History: -FHx: Diabetes (MGP) -Tobacco user: never  Merchant navy officer affordability: Medicaid  Current diabetes medications include: Lantus 10 units at bedtime, Victoza 1.8 mg daily Current hyperlipidemia medications include: none  Patient denies hypoglycemic events.  Patient reported dietary habits: Eats 4 tortillas (down from 8 daily), rice, fruits, not a big of vegetables, occasionally eats red meats; drinks water, rarely drinks soda, sometimes juice  Patient-reported exercise habits: not currently    Patient denies nocturia (nighttime urination).  Patient denies neuropathy (nerve pain). Patient reports some blurred vision.  Patient denies visual changes.  O:  POCT: 133 (post-prandial)  Lab Results  Component Value Date   HGBA1C 11.2 (H) 07/09/2019   There were no vitals filed for this visit.  Lipid Panel  No results found for: CHOL, TRIG, HDL, CHOLHDL, VLDL, LDLCALC, LDLDIRECT  Home fasting blood sugars: not checking since last visit  2 hour post-meal/random blood sugars: not checking since last visit  Clinical Atherosclerotic Cardiovascular Disease (ASCVD): No  The ASCVD Risk score Denman George DC Jr., et al., 2013) failed to calculate for the following  reasons:   The 2013 ASCVD risk score is only valid for ages 72 to 9   A/P: Diabetes longstanding currently uncontrolled with A1C 11.2%. Sugar today in clinic at goal (down from >300 at previous visit). Patient endorses medication adherence but is not checking blood sugar at home. She was able to contact and discuss with her mental health provider her issue with her medication. She is amenable to begin checking blood sugar at home again. She will follow-up in 2-3 weeks with more home CBG data. For now, will hold off on making changes.  -Continue current regimen.  -Extensively discussed pathophysiology of diabetes, recommended lifestyle interventions, dietary effects on blood sugar control -Counseled on s/sx of and management of hypoglycemia -Next A1C anticipated 10/2019.   Written patient instructions provided. Total time in face to face counseling 25 minutes.   Follow up Pharmacist Clinic Visit in 2 weeks.     Butch Penny, PharmD, CPP Clinical Pharmacist [facility] & [facility] [phone]

## 2019-09-19 ENCOUNTER — Telehealth: Payer: Medicaid Other | Admitting: Nurse Practitioner

## 2019-10-20 ENCOUNTER — Ambulatory Visit: Payer: Medicaid Other | Attending: Nurse Practitioner | Admitting: Pharmacist

## 2019-10-20 ENCOUNTER — Encounter: Payer: Self-pay | Admitting: Pharmacist

## 2019-10-20 ENCOUNTER — Other Ambulatory Visit: Payer: Self-pay

## 2019-10-20 DIAGNOSIS — Z794 Long term (current) use of insulin: Secondary | ICD-10-CM

## 2019-10-20 DIAGNOSIS — E1165 Type 2 diabetes mellitus with hyperglycemia: Secondary | ICD-10-CM

## 2019-10-20 NOTE — Progress Notes (Signed)
   Virtual Visit via Telephone Note Due to national recommendations of social distancing due to COVID 19, telehealth visit is felt to be most appropriate for this patient at this time. I discussed the limitations, risks, security and privacy concerns of performing an evaluation and management service by telephone and the availability of in person appointments. I also discussed with the patient that there may be a patient responsible charge related to this service. The patient expressed understanding and agreed to proceed.   I connected withJessica Derrell Gilmore on 09/13/21at 2:30 PM EDTEDT by telephoneand verified that I am speaking with the correct person using two identifiers.  Consent I discussed the limitations, risks, security and privacy concerns of performing an evaluation and management service by telephone and the availability of in person appointments. I also discussed with the patient that there may be a patient responsible charge related to this service. The patient expressed understanding and agreed to proceed.  Location of Patient: PrivateResidence  [facility] Provider[facility]   Persons participating in Telemedicine visit: Butch Penny, PharmD, CPP Patient   S:     PCP: Bertram Denver, NP  Patient in good spirits. Presents for diabetes evaluation, education, and management. Patient was referred and last seen by Primary Care Provider on 07/29/19. Pharmacy saw her 09/17/2019. No changes were made.   Patient reports diabetes was diagnosed in 2018.   Today, patient reports that she is taking her medications daily. Denies missed doses since last visit.   Family/Social History: -FHx: Diabetes (MGP) -Tobacco user: never  Merchant navy officer affordability: Medicaid  Current diabetes medications include: Lantus 10 units at bedtime, Victoza 1.8 mg daily Current hyperlipidemia medications include: none  Patient  denies hypoglycemic events.  Patient reported dietary habits: Eats 2-3 tortillas (down from 4 daily), rice, fruits, not a lot of vegetables, occasionally eats red meats; drinks water  Patient-reported exercise habits: walking as a means of travel; also walks for exercise - at least 10 minutes sometimes up to an hour daily    Patient denies nocturia (nighttime urination).  Patient reports some neuropathy - endorses a feeling that her arm falls asleep. Happens occasionally.  Patient reports improvement blurred vision.  Patient denies visual changes.  O:   Lab Results  Component Value Date   HGBA1C 11.2 (H) 07/09/2019   There were no vitals filed for this visit.  Lipid Panel  No results found for: CHOL, TRIG, HDL, CHOLHDL, VLDL, LDLCALC, LDLDIRECT  Home fasting blood sugars: 100 - 125  2 hour post-meal/random blood sugars: not checking since last visit  Clinical Atherosclerotic Cardiovascular Disease (ASCVD): No  The ASCVD Risk score Denman George DC Jr., et al., 2013) failed to calculate for the following reasons:   The 2013 ASCVD risk score is only valid for ages 46 to 34   A/P: Diabetes longstanding currently uncontrolled with A1C 11.2%, however, reported fasting CBGs at home are at goal. Patient endorses medication compliance. She denies hypoglycemia but verbalizes appropriate management plan. Hyperglycemic symptoms are improving. Lifestyle improving.  -Continue current regimen.  -Extensively discussed pathophysiology of diabetes, recommended lifestyle interventions, dietary effects on blood sugar control -Counseled on s/sx of and management of hypoglycemia -Next A1C anticipated 10/2019.   Written patient instructions provided. Total time in face to face counseling 25 minutes.   Follow up PCP Clinic Visit 10/29/2019.     Butch Penny, PharmD, CPP Clinical Pharmacist [facility] [phone]

## 2019-10-29 ENCOUNTER — Ambulatory Visit: Payer: Medicaid Other | Attending: Nurse Practitioner | Admitting: Nurse Practitioner

## 2019-10-29 ENCOUNTER — Encounter: Payer: Self-pay | Admitting: Nurse Practitioner

## 2019-10-29 DIAGNOSIS — Z79899 Other long term (current) drug therapy: Secondary | ICD-10-CM | POA: Insufficient documentation

## 2019-10-29 DIAGNOSIS — E1165 Type 2 diabetes mellitus with hyperglycemia: Secondary | ICD-10-CM | POA: Insufficient documentation

## 2019-10-29 DIAGNOSIS — Z794 Long term (current) use of insulin: Secondary | ICD-10-CM | POA: Insufficient documentation

## 2019-10-29 DIAGNOSIS — Z833 Family history of diabetes mellitus: Secondary | ICD-10-CM | POA: Diagnosis not present

## 2019-10-29 DIAGNOSIS — F209 Schizophrenia, unspecified: Secondary | ICD-10-CM | POA: Insufficient documentation

## 2019-10-29 DIAGNOSIS — Z8742 Personal history of other diseases of the female genital tract: Secondary | ICD-10-CM | POA: Diagnosis not present

## 2019-10-29 NOTE — Addendum Note (Signed)
Addended byMemory Dance on: 10/29/2019 02:05 PM   Modules accepted: Orders

## 2019-10-29 NOTE — Progress Notes (Signed)
Virtual Visit via Telephone Note Due to national recommendations of social distancing due to COVID 19, telehealth visit is felt to be most appropriate for this patient at this time.  I discussed the limitations, risks, security and privacy concerns of performing an evaluation and management service by telephone and the availability of in person appointments. I also discussed with the patient that there may be a patient responsible charge related to this service. The patient expressed understanding and agreed to proceed.    I connected with Angle Dirusso on 10/29/19  at   1:30 PM EDT  EDT by telephone and verified that I am speaking with the correct person using two identifiers.   Consent I discussed the limitations, risks, security and privacy concerns of performing an evaluation and management service by telephone and the availability of in person appointments. I also discussed with the patient that there may be a patient responsible charge related to this service. The patient expressed understanding and agreed to proceed.   Location of Patient: Private Residence   [facility] of Provider: [facility]    Persons participating in Telemedicine visit: Bertram Denver FNP-BC [place] CMA Rhianna Raulerson    History of Present Illness: Telemedicine visit for: F/U  has a past medical history of Depression, Diabetes mellitus without complication [facility]), 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HCC), and Schizophrenia (HCC).   DM TYPE 2 She only checks her blood glucose levels in 130-140s fasting.  I recommended she also monitor her blood glucose levels postprandial daily.  Current medications include Lantus 10 units daily and Victoza 1.8 mg daily.  She is on a renal dose lisinopril at 5 mg.  Awaiting lipid panel results at this time to start statin. Denies chest pain, shortness of breath, palpitations, lightheadedness, dizziness, headaches or BLE edema.  Lab Results  Component  Value Date   HGBA1C 11.2 (H) 07/09/2019      Past Medical History:  Diagnosis Date  . Depression   . Diabetes mellitus without complication (HCC)   . 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HCC)   . Schizophrenia [facility])     Past Surgical History:  Procedure Laterality Date  . NO PAST SURGERIES      Family History  Problem Relation Age of Onset  . Diabetes Maternal Grandmother   . Stomach cancer Maternal Grandmother     Social History   Socioeconomic History  . Marital status: Single    Spouse name: Not on file  . Number of children: Not on file  . Years of education: Not on file  . Highest education level: Not on file  Occupational History  . Not on file  Tobacco Use  . Smoking status: Never Smoker  . Smokeless tobacco: Never Used  Substance and Sexual Activity  . Alcohol use: Not Currently  . Drug use: Not Currently  . Sexual activity: Not Currently  Other Topics Concern  . Not on file  Social History Narrative  . Not on file   Social Determinants of Health   Financial Resource Strain:   . Difficulty of Paying Living Expenses: Not on file  Food Insecurity:   . Worried About Programme researcher, broadcasting/film/video in the Last Year: Not on file  . Ran Out of Food in the Last Year: Not on file  Transportation Needs:   . Lack of Transportation (Medical): Not on file  . Lack of Transportation (Non-Medical): Not on file  Physical Activity:   . Days of Exercise per Week: Not on file  .  Minutes of Exercise per Session: Not on file  Stress:   . Feeling of Stress : Not on file  Social Connections:   . Frequency of Communication with Friends and Family: Not on file  . Frequency of Social Gatherings with Friends and Family: Not on file  . Attends Religious Services: Not on file  . Active Member of Clubs or Organizations: Not on file  . Attends Banker Meetings: Not on file  . Marital Status: Not on file     Observations/Objective: Awake, alert and oriented x 3   Review of  Systems  Constitutional: Negative for fever, malaise/fatigue and weight loss.  HENT: Negative.  Negative for nosebleeds.   Eyes: Negative.  Negative for blurred vision, double vision and photophobia.  Respiratory: Negative.  Negative for cough and shortness of breath.   Cardiovascular: Negative.  Negative for chest pain, palpitations and leg swelling.  Gastrointestinal: Negative.  Negative for heartburn, nausea and vomiting.  Musculoskeletal: Negative.  Negative for myalgias.  Neurological: Negative.  Negative for dizziness, focal weakness, seizures and headaches.  Psychiatric/Behavioral: Negative for suicidal ideas.    Assessment and Plan: Melissa Gilmore was seen today for follow-up.  Diagnoses and all orders for this visit:  Type 2 diabetes mellitus with hyperglycemia, with long-term current use of insulin (HCC) -     Hemoglobin A1c; Future -     Lipid panel; Future -     Basic metabolic panel; Future Continue blood sugar control as discussed in office today, low carbohydrate diet, and regular physical exercise as tolerated, 150 minutes per week (30 min each day, 5 days per week, or 50 min 3 days per week). Keep blood sugar logs with fasting goal of 90-130 mg/dl, post prandial (after you eat) less than 180.  For Hypoglycemia: BS <60 and Hyperglycemia BS >400; contact the clinic ASAP. Annual eye exams and foot exams are recommended.   History of abnormal uterine bleeding -     CBC; Future     Follow Up Instructions Return in about 3 months (around 01/28/2020).     I discussed the assessment and treatment plan with the patient. The patient was provided an opportunity to ask questions and all were answered. The patient agreed with the plan and demonstrated an understanding of the instructions.   The patient was advised to call back or seek an in-person evaluation if the symptoms worsen or if the condition fails to improve as anticipated.  I provided 13 minutes of non-face-to-face time  during this encounter including median intraservice time, reviewing previous notes, labs, imaging, medications and explaining diagnosis and management.  Claiborne Rigg, FNP-BC

## 2019-10-30 LAB — CBC
Hematocrit: 40.9 % (ref 34.0–46.6)
Hemoglobin: 14 g/dL (ref 11.1–15.9)
MCH: 29.7 pg (ref 26.6–33.0)
MCHC: 34.2 g/dL (ref 31.5–35.7)
MCV: 87 fL (ref 79–97)
Platelets: 282 10*3/uL (ref 150–450)
RBC: 4.72 x10E6/uL (ref 3.77–5.28)
RDW: 12.7 % (ref 11.7–15.4)
WBC: 8.7 10*3/uL (ref 3.4–10.8)

## 2019-10-30 LAB — LIPID PANEL
Chol/HDL Ratio: 5.4 ratio — ABNORMAL HIGH (ref 0.0–4.4)
Cholesterol, Total: 227 mg/dL — ABNORMAL HIGH (ref 100–199)
HDL: 42 mg/dL (ref >39)
LDL Chol Calc (NIH): 161 mg/dL — ABNORMAL HIGH (ref 0–99)
Triglycerides: 133 mg/dL (ref 0–149)
VLDL Cholesterol Cal: 24 mg/dL (ref 5–40)

## 2019-10-30 LAB — HEMOGLOBIN A1C
Est. average glucose Bld gHb Est-mCnc: 186 mg/dL
Hgb A1c MFr Bld: 8.1 % — ABNORMAL HIGH (ref 4.8–5.6)

## 2019-10-30 LAB — BASIC METABOLIC PANEL
BUN/Creatinine Ratio: 13 (ref 9–23)
BUN: 8 mg/dL (ref 6–20)
CO2: 25 mmol/L (ref 20–29)
Calcium: 9.7 mg/dL (ref 8.7–10.2)
Chloride: 99 mmol/L (ref 96–106)
Creatinine, Ser: 0.64 mg/dL (ref 0.57–1.00)
GFR calc Af Amer: 146 mL/min/{1.73_m2} (ref >59)
GFR calc non Af Amer: 127 mL/min/{1.73_m2} (ref >59)
Glucose: 129 mg/dL — ABNORMAL HIGH (ref 65–99)
Potassium: 4.4 mmol/L (ref 3.5–5.2)
Sodium: 138 mmol/L (ref 134–144)

## 2019-12-03 ENCOUNTER — Encounter: Payer: Self-pay | Admitting: Nurse Practitioner

## 2019-12-03 ENCOUNTER — Other Ambulatory Visit: Payer: Self-pay

## 2019-12-03 ENCOUNTER — Ambulatory Visit: Payer: Medicaid Other | Attending: Nurse Practitioner | Admitting: Nurse Practitioner

## 2019-12-03 VITALS — BP 115/74 | HR 83 | Temp 97.7°F | Ht 65.5 in | Wt 244.0 lb

## 2019-12-03 DIAGNOSIS — Z23 Encounter for immunization: Secondary | ICD-10-CM | POA: Diagnosis not present

## 2019-12-03 DIAGNOSIS — E1165 Type 2 diabetes mellitus with hyperglycemia: Secondary | ICD-10-CM | POA: Diagnosis not present

## 2019-12-03 DIAGNOSIS — Z0000 Encounter for general adult medical examination without abnormal findings: Secondary | ICD-10-CM

## 2019-12-03 DIAGNOSIS — L732 Hidradenitis suppurativa: Secondary | ICD-10-CM

## 2019-12-03 DIAGNOSIS — Z794 Long term (current) use of insulin: Secondary | ICD-10-CM

## 2019-12-03 LAB — POCT URINALYSIS DIP (CLINITEK)
Bilirubin, UA: NEGATIVE
Blood, UA: NEGATIVE
Glucose, UA: >=1000 mg/dL — AB
Ketones, POC UA: NEGATIVE mg/dL
Leukocytes, UA: NEGATIVE
Nitrite, UA: NEGATIVE
POC PROTEIN,UA: NEGATIVE
Spec Grav, UA: 1.01 (ref 1.010–1.025)
Urobilinogen, UA: 0.2 E.U./dL
pH, UA: 5.5 (ref 5.0–8.0)

## 2019-12-03 LAB — GLUCOSE, POCT (MANUAL RESULT ENTRY)
POC Glucose: 424 mg/dl — AB (ref 70–99)
POC Glucose: 430 mg/dl — AB (ref 70–99)

## 2019-12-03 MED ORDER — INSULIN ASPART 100 UNIT/ML ~~LOC~~ SOLN
20.0000 [IU] | Freq: Once | SUBCUTANEOUS | Status: AC
  Administered 2019-12-03: 20 [IU] via SUBCUTANEOUS

## 2019-12-03 MED ORDER — HIBICLENS 4 % EX LIQD: Freq: Every day | CUTANEOUS | 2 refills | Status: DC | PRN

## 2019-12-03 MED ORDER — CLINDAMYCIN PHOSPHATE 1 % EX SOLN: Freq: Two times a day (BID) | CUTANEOUS | 0 refills | Status: DC

## 2019-12-03 NOTE — Progress Notes (Signed)
Assessment & Plan:  Melissa Gilmore was seen today for annual exam.  Diagnoses and all orders for this visit:  Encounter for annual physical exam  Need for immunization against influenza -     Flu Vaccine QUAD 6+ mos PF IM (Fluarix Quad PF)  Type 2 diabetes mellitus with hyperglycemia, with long-term current use of insulin (HCC) -     Cancel: Glucose (CBG) -     insulin aspart (novoLOG) injection 20 Units -     Glucose (CBG) -     POCT URINALYSIS DIP (CLINITEK) -     Glucose (CBG) Diabetes is poorly controlled. Advised patient to keep a fasting blood sugar log fast, 2 hours post lunch and bedtime which will be reviewed at the next office visit.  Continue blood sugar control as discussed in office today, low carbohydrate diet, and regular physical exercise as tolerated, 150 minutes per week (30 min each day, 5 days per week, or 50 min 3 days per week). Keep blood sugar logs with fasting goal of 90-130 mg/dl, post prandial (after you eat) less than 180.  For Hypoglycemia: BS <60 and Hyperglycemia BS >400; contact the clinic ASAP. Annual eye exams and foot exams are recommended.   Hidradenitis suppurativa -     chlorhexidine (HIBICLENS) 4 % external liquid; Apply topically daily as needed. After clindamycin is completed -     clindamycin (CLEOCIN-T) 1 % external solution; Apply topically 2 (two) times daily. Apply to underarms as directed    Patient has been counseled on age-appropriate routine health concerns for screening and prevention. These are reviewed and up-to-date. Referrals have been placed accordingly. Immunizations are up-to-date or declined.    Subjective:   Chief Complaint  Patient presents with   Annual Exam    Pt. is here for a physical.   HPI Melissa Gilmore 24 y.o. female presents to office today for physical. Diabetes is poorly controlled. Glucose >400 today. Urine negative for ketones.  She is asymptomatic. I have encouraged her to get her diabetes under control  due to risk of long term complications. Encouraged her to work on her weight and diet.    Hidradenitis Located in both axilla regions. She has one lesion in the right axilla that has been draining purulent fluid. Tender to touch.    Review of Systems  Constitutional: Negative.  Negative for chills, fever, malaise/fatigue and weight loss.  HENT: Negative.  Negative for nosebleeds.   Eyes: Negative.  Negative for blurred vision, double vision and photophobia.  Respiratory: Negative.  Negative for cough, sputum production, shortness of breath and wheezing.   Cardiovascular: Negative.  Negative for chest pain, palpitations and leg swelling.  Gastrointestinal: Negative.  Negative for abdominal pain, blood in stool, constipation, diarrhea, heartburn, melena, nausea and vomiting.  Genitourinary: Negative.   Musculoskeletal: Negative.  Negative for myalgias.  Skin: Negative.  Negative for rash.  Neurological: Negative.  Negative for dizziness, tremors, speech change, focal weakness, seizures and headaches.  Psychiatric/Behavioral: Negative.  Negative for depression and suicidal ideas. The patient is not nervous/anxious and does not have insomnia.     Past Medical History:  Diagnosis Date   Depression    Diabetes mellitus without complication (HCC)    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HCC)    Schizophrenia (HCC)     Past Surgical History:  Procedure Laterality Date   NO PAST SURGERIES      Family History  Problem Relation Age of Onset   Diabetes Maternal Grandmother    Stomach  cancer Maternal Grandmother     Social History Reviewed with no changes to be made today.   Outpatient Medications Prior to Visit  Medication Sig Dispense Refill   Accu-Chek Softclix Lancets lancets Use as instructed. Inject into the skin once daily E11.65 100 each 3   glucose blood (ACCU-CHEK GUIDE) test strip Use as instructed. Check blood glucose by fingerstick twice per day. 100 each 12    insulin glargine (LANTUS SOLOSTAR) 100 UNIT/ML Solostar Pen Inject 10 Units into the skin daily. 15 mL 2   Insulin Pen Needle (B-D UF III MINI PEN NEEDLES) 31G X 5 MM MISC Use as instructed. Inject into the skin once daily 100 each 3   liraglutide (VICTOZA) 18 MG/3ML SOPN SubQ:  Inject 0.6 mg once daily into the skin. Week 2: increase to 1.2 mg once daily; week 3: increase to 1.8 mg once daily 3 pen 6   lisinopril (ZESTRIL) 5 MG tablet Take 1 tablet (5 mg total) by mouth daily. 90 tablet 3   Norgestimate-Ethinyl Estradiol Triphasic (ORTHO TRI-CYCLEN, 28,) 0.18/0.215/0.25 MG-35 MCG tablet Take 1 tablet by mouth daily. 28 tablet 11   No facility-administered medications prior to visit.    No Known Allergies     Objective:    BP 115/74 (BP Location: Left Arm, Patient Position: Sitting, Cuff Size: Large)    Pulse 83    Temp 97.7 F (36.5 C) (Temporal)    Ht 5' 5.5" (1.664 m)    Wt 244 lb (110.7 kg)    SpO2 97%    BMI 39.99 kg/m  Wt Readings from Last 3 Encounters:  12/03/19 244 lb (110.7 kg)  07/29/19 237 lb (107.5 kg)  07/02/19 220 lb (99.8 kg)    Physical Exam Constitutional:      Appearance: She is well-developed.  HENT:     Head: Normocephalic and atraumatic.     Right Ear: External ear normal.     Left Ear: External ear normal.     Nose: Nose normal.     Mouth/Throat:     Pharynx: No oropharyngeal exudate.  Eyes:     General: No scleral icterus.       Right eye: No discharge.     Conjunctiva/sclera: Conjunctivae normal.     Pupils: Pupils are equal, round, and reactive to light.  Neck:     Thyroid: No thyromegaly.     Trachea: No tracheal deviation.  Cardiovascular:     Rate and Rhythm: Normal rate and regular rhythm.     Heart sounds: Normal heart sounds. No murmur heard.  No friction rub.  Pulmonary:     Effort: Pulmonary effort is normal. No accessory muscle usage or respiratory distress.     Breath sounds: Normal breath sounds. No decreased breath sounds,  wheezing, rhonchi or rales.  Chest:     Breasts:        Left: No inverted nipple.    Abdominal:     General: Bowel sounds are normal. There is no distension.     Palpations: Abdomen is soft. There is no mass.     Tenderness: There is no abdominal tenderness. There is no guarding or rebound.  Musculoskeletal:        General: No tenderness or deformity. Normal range of motion.     Cervical back: Normal range of motion and neck supple.  Lymphadenopathy:     Cervical: No cervical adenopathy.  Skin:    General: Skin is warm and dry.  Findings: No erythema.  Neurological:     Mental Status: She is alert and oriented to person, place, and time.     Cranial Nerves: No cranial nerve deficit.     Coordination: Coordination normal.     Deep Tendon Reflexes: Reflexes are normal and symmetric.  Psychiatric:        Speech: Speech normal.        Behavior: Behavior normal.        Thought Content: Thought content normal.        Judgment: Judgment normal.          Patient has been counseled extensively about nutrition and exercise as well as the importance of adherence with medications and regular follow-up. The patient was given clear instructions to go to ER or return to medical center if symptoms don't improve, worsen or new problems develop. The patient verbalized understanding.   Follow-up: Return in about 9 weeks (around 02/04/2020).   Claiborne Rigg, FNP-BC [facility] [place], [place] [phone]   12/03/2019, 3:27 PM

## 2019-12-03 NOTE — Patient Instructions (Signed)
Hidradenitis Suppurativa Hidradenitis suppurativa is a long-term (chronic) skin disease. It is similar to a severe form of acne, but it affects areas of the body where acne would be unusual, especially areas of the body where skin rubs against skin and becomes moist. These include:  Underarms.  Groin.  Genital area.  Buttocks.  Upper thighs.  Breasts. Hidradenitis suppurativa may start out as small lumps or pimples caused by blocked sweat glands or hair follicles. Pimples may develop into deep sores that break open (rupture) and drain pus. Over time, affected areas of skin may thicken and become scarred. This condition is rare and does not spread from person to person (non-contagious). What are the causes? The exact cause of this condition is not known. It may be related to:  Female and female hormones.  An overactive disease-fighting system (immune system). The immune system may over-react to blocked hair follicles or sweat glands and cause swelling and pus-filled sores. What increases the risk? You are more likely to develop this condition if you:  Are female.  Are 11-55 years old.  Have a family history of hidradenitis suppurativa.  Have a personal history of acne.  Are overweight.  Smoke.  Take the medicine lithium. What are the signs or symptoms? The first symptoms are usually painful bumps in the skin, similar to pimples. The condition may get worse over time (progress), or it may only cause mild symptoms. If the disease progresses, symptoms may include:  Skin bumps getting bigger and growing deeper into the skin.  Bumps rupturing and draining pus.  Itchy, infected skin.  Skin getting thicker and scarred.  Tunnels under the skin (fistulas) where pus drains from a bump.  Pain during daily activities, such as pain during walking if your groin area is affected.  Emotional problems, such as stress or depression. This condition may affect your appearance and your  ability or willingness to wear certain clothes or do certain activities. How is this diagnosed? This condition is diagnosed by a health care provider who specializes in skin diseases (dermatologist). You may be diagnosed based on:  Your symptoms and medical history.  A physical exam.  Testing a pus sample for infection.  Blood tests. How is this treated? Your treatment will depend on how severe your symptoms are. The same treatment will not work for everybody with this condition. You may need to try several treatments to find what works best for you. Treatment may include:  Cleaning and bandaging (dressing) your wounds as needed.  Lifestyle changes, such as new skin care routines.  Taking medicines, such as: ? Antibiotics. ? Acne medicines. ? Medicines to reduce the activity of the immune system. ? A diabetes medicine (metformin). ? Birth control pills, for women. ? Steroids to reduce swelling and pain.  Working with a mental health care provider, if you experience emotional distress due to this condition. If you have severe symptoms that do not get better with medicine, you may need surgery. Surgery may involve:  Using a laser to clear the skin and remove hair follicles.  Opening and draining deep sores.  Removing the areas of skin that are diseased and scarred. Follow these instructions at home: Medicines   Take over-the-counter and prescription medicines only as told by your health care provider.  If you were prescribed an antibiotic medicine, take it as told by your health care provider. Do not stop taking the antibiotic even if your condition improves. Skin care  If you have open wounds, cover   them with a clean dressing as told by your health care provider. Keep wounds clean by washing them gently with soap and water when you bathe.  Do not shave the areas where you get hidradenitis suppurativa.  Do not wear deodorant.  Wear loose-fitting clothes.  Try to avoid  getting overheated or sweaty. If you get sweaty or wet, change into clean, dry clothes as soon as you can.  To help relieve pain and itchiness, cover sore areas with a warm, clean washcloth (warm compress) for 5-10 minutes as often as needed.  If told by your health care provider, take a bleach bath twice a week: ? Fill your bathtub halfway with water. ? Pour in  cup of unscented household bleach. ? Soak in the tub for 5-10 minutes. ? Only soak from the neck down. Avoid water on your face and hair. ? Shower to rinse off the bleach from your skin. General instructions  Learn as much as you can about your disease so that you have an active role in your treatment. Work closely with your health care provider to find treatments that work for you.  If you are overweight, work with your health care provider to lose weight as recommended.  Do not use any products that contain nicotine or tobacco, such as cigarettes and e-cigarettes. If you need help quitting, ask your health care provider.  If you struggle with living with this condition, talk with your health care provider or work with a mental health care provider as recommended.  Keep all follow-up visits as told by your health care provider. This is important. Where to find more information  Hidradenitis Suppurativa Foundation, Inc.: https://www.hs-foundation.org/ Contact a health care provider if you have:  A flare-up of hidradenitis suppurativa.  A fever or chills.  Trouble controlling your symptoms at home.  Trouble doing your daily activities because of your symptoms.  Trouble dealing with emotional problems related to your condition. Summary  Hidradenitis suppurativa is a long-term (chronic) skin disease. It is similar to a severe form of acne, but it affects areas of the body where acne would be unusual.  The first symptoms are usually painful bumps in the skin, similar to pimples. The condition may get worse over time  (progress), or it may only cause mild symptoms.  If you have open wounds, cover them with a clean dressing as told by your health care provider. Keep wounds clean by washing them gently with soap and water when you bathe.  Besides skin care, treatment may include medicines, laser treatment, and surgery. This information is not intended to replace advice given to you by your health care provider. Make sure you discuss any questions you have with your health care provider. Document Revised: 01/31/2017 Document Reviewed: 01/31/2017 Elsevier Patient Education  2020 [employer].  

## 2020-01-28 ENCOUNTER — Ambulatory Visit: Payer: Medicaid Other | Admitting: Nurse Practitioner

## 2020-06-23 ENCOUNTER — Encounter (HOSPITAL_COMMUNITY): Payer: Self-pay | Admitting: *Deleted

## 2020-06-23 ENCOUNTER — Emergency Department (HOSPITAL_COMMUNITY)
Admission: EM | Admit: 2020-06-23 | Discharge: 2020-06-23 | Disposition: A | Discharge status: Home / Self Care | Payer: Medicaid Other | Attending: Emergency Medicine | Admitting: Emergency Medicine

## 2020-06-23 ENCOUNTER — Other Ambulatory Visit: Payer: Self-pay

## 2020-06-23 DIAGNOSIS — E119 Type 2 diabetes mellitus without complications: Secondary | ICD-10-CM | POA: Insufficient documentation

## 2020-06-23 DIAGNOSIS — Z794 Long term (current) use of insulin: Secondary | ICD-10-CM | POA: Insufficient documentation

## 2020-06-23 DIAGNOSIS — J069 Acute upper respiratory infection, unspecified: Secondary | ICD-10-CM | POA: Insufficient documentation

## 2020-06-23 DIAGNOSIS — Z20822 Contact with and (suspected) exposure to covid-19: Secondary | ICD-10-CM | POA: Insufficient documentation

## 2020-06-23 DIAGNOSIS — R059 Cough, unspecified: Secondary | ICD-10-CM | POA: Diagnosis present

## 2020-06-23 LAB — RESP PANEL BY RT-PCR (FLU A&B, COVID) ARPGX2
Influenza A by PCR: NEGATIVE
Influenza B by PCR: NEGATIVE
SARS Coronavirus 2 by RT PCR: NEGATIVE

## 2020-06-23 NOTE — Discharge Instructions (Signed)
Home to quarantine pending COVID test results, follow up in your MyChart account.  Recheck with your doctor as needed, return to the ER for severe or concerning symptoms.

## 2020-06-23 NOTE — ED Triage Notes (Signed)
Pt reports having cough, sore throat and bodyaches for several days. Airway intact at triage.

## 2020-06-23 NOTE — ED Provider Notes (Signed)
[name]   CSN: [id number] Arrival date & time: 06/23/20  1701     History Chief Complaint  Patient presents with  . Cough    Melissa Gilmore is a 25 y.o. female.  25 yo female with sore throat onset Friday, cough, body aches, headaches. No fever, exposed to sister with similar symptoms.  Non smoker, had asthma as a younger child.  Has had COVID vaccines, no booster.  Melissa Gilmore was evaluated in Emergency Department on 06/23/2020 for the symptoms described in the history of present illness. She was evaluated in the context of the global COVID-19 pandemic, which necessitated consideration that the patient might be at risk for infection with the SARS-CoV-2 virus that causes COVID-19. Institutional protocols and algorithms that pertain to the evaluation of patients at risk for COVID-19 are in a state of rapid change based on information released by regulatory bodies including the CDC and federal and state organizations. These policies and algorithms were followed during the patient's care in the ED.         Past Medical History:  Diagnosis Date  . Depression   . Diabetes mellitus without complication (HCC)   . 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HCC)   . Schizophrenia (HCC)     There are no problems to display for this patient.   Past Surgical History:  Procedure Laterality Date  . NO PAST SURGERIES       OB History   No obstetric history on file.     Family History  Problem Relation Age of Onset  . Diabetes Maternal Grandmother   . Stomach cancer Maternal Grandmother     Social History   Tobacco Use  . Smoking status: Never Smoker  . Smokeless tobacco: Never Used  Substance Use Topics  . Alcohol use: Not Currently  . Drug use: Not Currently    Home Medications Prior to Admission medications   Medication Sig Start Date End Date Taking? Authorizing Provider  Accu-Chek Softclix Lancets lancets Use  as instructed. Inject into the skin once daily E11.65 07/12/19   Claiborne Rigg, NP  chlorhexidine (HIBICLENS) 4 % external liquid Apply topically daily as needed. After clindamycin is completed 12/03/19   Claiborne Rigg, NP  clindamycin (CLEOCIN-T) 1 % external solution Apply topically 2 (two) times daily. Apply to underarms as directed 12/03/19   Claiborne Rigg, NP  glucose blood (ACCU-CHEK GUIDE) test strip Use as instructed. Check blood glucose by fingerstick twice per day. 07/12/19   Claiborne Rigg, NP  insulin glargine (LANTUS SOLOSTAR) 100 UNIT/ML Solostar Pen Inject 10 Units into the skin daily. 09/03/19   Hoy Register, MD  Insulin Pen Needle (B-D UF III MINI PEN NEEDLES) 31G X 5 MM MISC Use as instructed. Inject into the skin once daily 07/12/19   Claiborne Rigg, NP  liraglutide (VICTOZA) 18 MG/3ML SOPN SubQ:  Inject 0.6 mg once daily into the skin. Week 2: increase to 1.2 mg once daily; week 3: increase to 1.8 mg once daily 07/12/19   Claiborne Rigg, NP  lisinopril (ZESTRIL) 5 MG tablet Take 1 tablet (5 mg total) by mouth daily. 07/12/19   Claiborne Rigg, NP  Norgestimate-Ethinyl Estradiol Triphasic (ORTHO TRI-CYCLEN, 28,) 0.18/0.215/0.25 MG-35 MCG tablet Take 1 tablet by mouth daily. 07/29/19   Claiborne Rigg, NP    Allergies    Patient has no known allergies.  Review of Systems   Review of Systems  Constitutional: Negative for  fever.  HENT: Positive for congestion and sore throat.   Eyes: Positive for redness.  Respiratory: Positive for cough.   Gastrointestinal: Negative for diarrhea, nausea and vomiting.  Genitourinary: Negative for dysuria.  Musculoskeletal: Positive for arthralgias and myalgias.  Skin: Negative for rash and wound.  Allergic/Immunologic: Positive for immunocompromised state.  Neurological: Negative for weakness.  Hematological: Negative for adenopathy.  Psychiatric/Behavioral: Negative for confusion.  All other systems reviewed and are  negative.   Physical Exam Updated Vital Signs BP (!) 156/102 (BP Location: Left Arm)   Pulse (!) 102   Temp 99.7 F (37.6 C) (Oral)   Resp 18   SpO2 100%   Physical Exam Vitals and nursing note reviewed.  Constitutional:      General: She is not in acute distress.    Appearance: She is well-developed. She is obese. She is not diaphoretic.  HENT:     Head: Normocephalic and atraumatic.     Right Ear: Tympanic membrane and ear canal normal.     Left Ear: Tympanic membrane and ear canal normal.     Nose: Congestion present.     Mouth/Throat:     Mouth: Mucous membranes are moist.     Pharynx: Posterior oropharyngeal erythema present. No oropharyngeal exudate.  Eyes:     General:        Right eye: No discharge.        Left eye: No discharge.     Conjunctiva/sclera:     Right eye: Right conjunctiva is injected.     Left eye: Left conjunctiva is injected.     Pupils: Pupils are equal, round, and reactive to light.  Cardiovascular:     Rate and Rhythm: Normal rate and regular rhythm.     Heart sounds: Normal heart sounds.  Pulmonary:     Effort: Pulmonary effort is normal.     Breath sounds: Normal breath sounds.  Musculoskeletal:     Cervical back: Neck supple. No tenderness.     Right lower leg: No edema.     Left lower leg: No edema.  Lymphadenopathy:     Cervical: No cervical adenopathy.  Skin:    General: Skin is warm and dry.     Findings: No erythema or rash.  Neurological:     Mental Status: She is alert and oriented to person, place, and time.     Gait: Gait normal.  Psychiatric:        Behavior: Behavior normal.     ED Results / Procedures / Treatments   Labs (all labs ordered are listed, but only abnormal results are displayed) Labs Reviewed  RESP PANEL BY RT-PCR (FLU A&B, COVID) [id number]    EKG None  Radiology No results found.  Procedures Procedures   Medications Ordered in ED Medications - No data to display  ED Course  I have reviewed  the triage vital signs and the nursing notes.  Pertinent labs & imaging results that were available during my care of the patient were reviewed by me and considered in my medical decision making (see chart for details).  Clinical Course as of 06/23/20 1902  Wed Jun 23, 2020  3156 25 year old female with history of diabetes presents with UR symptoms x 5 days, exposed to sister with similar symptoms. Patient is taking OTC cough medication at home. On exam, has frequent dry cough, mild conjunctival injection, lungs clear, respirations even and unlabored. Patient's blood pressure is mildly elevated at 156/102 on recheck, possibly related  to OTC cold medications. Advised to dc use and use Coricidin HBP instead. Will send out COVID test, if positive may treat with antiviral due to history of diabetes.  [LM]    Clinical Course User Index [LM] Alden Hipp   MDM Rules/Calculators/A&P                          Final Clinical Impression(s) / ED Diagnoses Final diagnoses:  Viral URI with cough    Rx / DC Orders ED Discharge Orders    None       Alden Hipp 06/23/20 1902    Koleen Distance, MD 06/23/20 2113

## 2020-08-06 ENCOUNTER — Ambulatory Visit: Payer: Medicaid Other | Attending: Nurse Practitioner | Admitting: Nurse Practitioner

## 2020-08-06 ENCOUNTER — Other Ambulatory Visit (HOSPITAL_COMMUNITY)
Admission: RE | Admit: 2020-08-06 | Discharge: 2020-08-06 | Disposition: A | Discharge status: Home / Self Care | Payer: Medicaid Other | Source: Ambulatory Visit | Attending: Nurse Practitioner | Admitting: Nurse Practitioner

## 2020-08-06 ENCOUNTER — Other Ambulatory Visit: Payer: Self-pay

## 2020-08-06 ENCOUNTER — Telehealth: Payer: Self-pay | Admitting: Nurse Practitioner

## 2020-08-06 ENCOUNTER — Encounter: Payer: Self-pay | Admitting: Nurse Practitioner

## 2020-08-06 VITALS — BP 133/83 | HR 70 | Ht 65.5 in | Wt 233.2 lb

## 2020-08-06 DIAGNOSIS — Z793 Long term (current) use of hormonal contraceptives: Secondary | ICD-10-CM | POA: Insufficient documentation

## 2020-08-06 DIAGNOSIS — N760 Acute vaginitis: Secondary | ICD-10-CM | POA: Diagnosis not present

## 2020-08-06 DIAGNOSIS — Z79899 Other long term (current) drug therapy: Secondary | ICD-10-CM | POA: Insufficient documentation

## 2020-08-06 DIAGNOSIS — Z114 Encounter for screening for human immunodeficiency virus [HIV]: Secondary | ICD-10-CM | POA: Diagnosis not present

## 2020-08-06 DIAGNOSIS — E785 Hyperlipidemia, unspecified: Secondary | ICD-10-CM

## 2020-08-06 DIAGNOSIS — Z8742 Personal history of other diseases of the female genital tract: Secondary | ICD-10-CM

## 2020-08-06 DIAGNOSIS — Z113 Encounter for screening for infections with a predominantly sexual mode of transmission: Secondary | ICD-10-CM | POA: Insufficient documentation

## 2020-08-06 DIAGNOSIS — Z794 Long term (current) use of insulin: Secondary | ICD-10-CM | POA: Diagnosis not present

## 2020-08-06 DIAGNOSIS — Z7251 High risk heterosexual behavior: Secondary | ICD-10-CM | POA: Insufficient documentation

## 2020-08-06 DIAGNOSIS — E1165 Type 2 diabetes mellitus with hyperglycemia: Secondary | ICD-10-CM

## 2020-08-06 DIAGNOSIS — F331 Major depressive disorder, recurrent, moderate: Secondary | ICD-10-CM | POA: Insufficient documentation

## 2020-08-06 DIAGNOSIS — Z3202 Encounter for pregnancy test, result negative: Secondary | ICD-10-CM

## 2020-08-06 LAB — POCT GLYCOSYLATED HEMOGLOBIN (HGB A1C): HbA1c, POC (controlled diabetic range): 8.9 % — AB (ref 0.0–7.0)

## 2020-08-06 LAB — POCT URINE PREGNANCY: Preg Test, Ur: NEGATIVE

## 2020-08-06 LAB — GLUCOSE, POCT (MANUAL RESULT ENTRY): POC Glucose: 150 mg/dl — AB (ref 70–99)

## 2020-08-06 MED ORDER — ACCU-CHEK GUIDE ME W/DEVICE KIT: PACK | 0 refills | Status: DC

## 2020-08-06 MED ORDER — ACCU-CHEK GUIDE VI STRP: ORAL_STRIP | 12 refills | Status: DC

## 2020-08-06 MED ORDER — ACCU-CHEK SOFTCLIX LANCETS MISC: 3 refills | Status: DC

## 2020-08-06 MED ORDER — BD PEN NEEDLE MINI U/F 31G X 5 MM MISC: 3 refills | Status: DC

## 2020-08-06 MED ORDER — ACCU-CHEK GUIDE ME W/DEVICE KIT: 1.0000 | PACK | Freq: Once | 0 refills | Status: DC

## 2020-08-06 MED ORDER — LANTUS SOLOSTAR 100 UNIT/ML ~~LOC~~ SOPN: 20.0000 [IU] | PEN_INJECTOR | Freq: Every day | SUBCUTANEOUS | 1 refills | Status: DC

## 2020-08-06 NOTE — Telephone Encounter (Signed)
Melissa Gilmore from [employer] in need of clarity regarding glucose blood (ACCU-CHEK GUIDE) test strip and Accu-Chek Softclix Lancets directions. Should patient test once or twice daily.  Please call pharmacist at [phone]

## 2020-08-06 NOTE — Telephone Encounter (Signed)
Pt should test twice daily. I have resent her supplies to indicate this.

## 2020-08-06 NOTE — Progress Notes (Signed)
Assessment & Plan:  Melissa Gilmore was seen today for diabetes.  Diagnoses and all orders for this visit:  Type 2 diabetes mellitus with hyperglycemia, with long-term current use of insulin (HCC) -     POCT glucose (manual entry) -     POCT glycosylated hemoglobin (Hb A1C) -     CMP14+EGFR -     insulin glargine (LANTUS SOLOSTAR) 100 UNIT/ML Solostar Pen; Inject 20 Units into the skin daily. -     Insulin Pen Needle (B-D UF III MINI PEN NEEDLES) 31G X 5 MM MISC; Use as instructed. Inject into the skin once daily  Dyslipidemia, goal LDL below 70 -     Lipid panel INSTRUCTIONS: Work on a low fat, heart healthy diet and participate in regular aerobic exercise program by working out at least 150 minutes per week; 5 days a week-30 minutes per day. Avoid red meat/beef/steak,  fried foods. junk foods, sodas, sugary drinks, unhealthy snacking, alcohol and smoking.  Drink at least 80 oz of water per day and monitor your carbohydrate intake daily.    History of abnormal uterine bleeding -     CBC  Encounter for screening for HIV -     HIV antibody (with reflex)  High risk heterosexual behavior -     Cervicovaginal ancillary only -     POCT urine pregnancy  Moderate episode of recurrent major depressive disorder ([place]) -     Ambulatory referral to Psychiatry.   Patient has been counseled on age-appropriate routine health concerns for screening and prevention. These are reviewed and up-to-date. Referrals have been placed accordingly. Immunizations are up-to-date or declined.    Subjective:   Chief Complaint  Patient presents with   Diabetes   HPI Melissa Gilmore 25 y.o. female presents to office today for follow up to DM.   DM 2 Lantus increased today to 20 units. Diabetes is poorly controlled. She will continue on victoza 1.8 mg daily. She is on renal dose ACE.  Lab Results  Component Value Date   HGBA1C 8.9 (A) 08/06/2020    Would like STD testing today. She is currently in a new  relationship and is sexually active. She is taking birth control pills as prescribed.    Depression and Anxiety She is currently seeing a therapist but does not feel her therapy is improving her depressive state. She has been seeing the same therapist for 2 years which has not been very beneficial.  Will place referral to new therapist today. Depression screen [facility] 2/9 08/06/2020 07/29/2019 07/02/2019  Decreased Interest 2 1 0  Down, Depressed, Hopeless _0 PHQ - 2 Score _1 Altered sleeping _2 Tired, decreased energy 3 2 0  Change in appetite 1 1 0  Feeling bad or failure about yourself  3 3 0  Trouble concentrating 0 0 0  Moving slowly or fidgety/restless 3 2 0  Suicidal thoughts 0 0 0  PHQ-9 Score _3 GAD 7 : Generalized Anxiety Score 08/06/2020 07/29/2019 07/02/2019  Nervous, Anxious, on Edge _4 Control/stop worrying _5 Worry too much - different things _6 Trouble relaxing _7 Restless 1 0 0  Easily annoyed or irritable _8 Afraid - awful might happen 3 1 0  Total GAD 7 Score _9 Review of Systems  Constitutional:  Negative for fever,  malaise/fatigue and weight loss.  HENT: Negative.  Negative for nosebleeds.   Eyes: Negative.  Negative for blurred vision, double vision and photophobia.  Respiratory: Negative.  Negative for cough and shortness of breath.   Cardiovascular: Negative.  Negative for chest pain, palpitations and leg swelling.  Gastrointestinal: Negative.  Negative for heartburn, nausea and vomiting.  Musculoskeletal: Negative.  Negative for myalgias.  Neurological: Negative.  Negative for dizziness, focal weakness, seizures and headaches.  Psychiatric/Behavioral:  Positive for depression. Negative for suicidal ideas. The patient is nervous/anxious.    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   Outpatient Medications Prior to Visit  Medication Sig Dispense Refill   chlorhexidine (HIBICLENS) 4 % external liquid Apply topically daily as needed. After clindamycin is completed 240 mL 2   clindamycin (CLEOCIN-T) 1 % external solution Apply topically 2 (two) times daily. Apply to underarms as directed 60 mL 0   liraglutide (VICTOZA) 18 MG/3ML SOPN SubQ:  Inject 0.6 mg once daily into the skin. Week 2: increase to 1.2 mg once daily; week 3: increase to 1.8 mg once daily 3 pen 6   lisinopril (ZESTRIL) 5 MG tablet Take 1 tablet (5 mg total) by mouth daily. 90 tablet 3   Norgestimate-Ethinyl Estradiol Triphasic (ORTHO TRI-CYCLEN, 28,) 0.18/0.215/0.25 MG-35 MCG tablet Take 1 tablet by mouth daily. 28 tablet 11   Accu-Chek Softclix Lancets lancets Use as instructed. Inject into the skin once daily E11.65 100 each 3   glucose blood (ACCU-CHEK GUIDE) test strip Use as instructed. Check blood glucose by fingerstick twice per day. 100 each 12   insulin glargine (LANTUS SOLOSTAR) 100 UNIT/ML Solostar Pen Inject 10 Units into the skin daily. 15 mL 2   Insulin Pen Needle (B-D UF III MINI PEN NEEDLES) 31G X 5 MM MISC Use as instructed. Inject into the skin once daily 100 each 3   No facility-administered medications prior to visit.    No Known Allergies     Objective:    BP 133/83   Pulse 70   Ht 5' 5.5" (1.664 m)   Wt 233 lb 3.2 oz (105.8 kg)   SpO2 100%   BMI 38.22 kg/m  Wt Readings from Last 3 Encounters:  08/06/20 233 lb 3.2 oz (105.8 kg)  12/03/19 244 lb (110.7 kg)  07/29/19 237 lb (107.5 kg)    Physical Exam Vitals and nursing note reviewed.  Constitutional:      Appearance: She is well-developed.  HENT:     Head:  Normocephalic and atraumatic.  Cardiovascular:     Rate and Rhythm: Normal rate and regular rhythm.     Heart sounds: Normal heart sounds. No murmur heard.   No friction rub. No gallop.  Pulmonary:     Effort: Pulmonary effort is normal. No tachypnea or respiratory distress.     Breath sounds: Normal breath sounds. No decreased breath sounds, wheezing, rhonchi or rales.  Chest:     Chest wall: No tenderness.  Abdominal:     General: Bowel sounds are normal.  Palpations: Abdomen is soft.  Musculoskeletal:        General: Normal range of motion.     Cervical back: Normal range of motion.  Skin:    General: Skin is warm and dry.  Neurological:     Mental Status: She is alert and oriented to person, place, and time.     Coordination: Coordination normal.  Psychiatric:        Behavior: Behavior normal. Behavior is cooperative.        Thought Content: Thought content normal.        Judgment: Judgment normal.         Patient has been counseled extensively about nutrition and exercise as well as the importance of adherence with medications and regular follow-up. The patient was given clear instructions to go to ER or return to medical center if symptoms don't improve, worsen or new problems develop. The patient verbalized understanding.   Follow-up: Return in about 3 months (around 11/06/2020).   Gildardo Pounds, FNP-BC [facility] [place], [place]   08/06/2020, 2:02 PM

## 2020-08-07 LAB — CMP14+EGFR
ALT: 27 IU/L (ref 0–32)
AST: 26 IU/L (ref 0–40)
Albumin/Globulin Ratio: 1.5 (ref 1.2–2.2)
Albumin: 4.3 g/dL (ref 3.9–5.0)
Alkaline Phosphatase: 81 IU/L (ref 44–121)
BUN/Creatinine Ratio: 15 (ref 9–23)
BUN: 9 mg/dL (ref 6–20)
Bilirubin Total: 0.3 mg/dL (ref 0.0–1.2)
CO2: 23 mmol/L (ref 20–29)
Calcium: 9.5 mg/dL (ref 8.7–10.2)
Chloride: 100 mmol/L (ref 96–106)
Creatinine, Ser: 0.59 mg/dL (ref 0.57–1.00)
Globulin, Total: 2.9 g/dL (ref 1.5–4.5)
Glucose: 144 mg/dL — ABNORMAL HIGH (ref 65–99)
Potassium: 4.2 mmol/L (ref 3.5–5.2)
Sodium: 137 mmol/L (ref 134–144)
Total Protein: 7.2 g/dL (ref 6.0–8.5)
eGFR: 130 mL/min/{1.73_m2} (ref >59)

## 2020-08-07 LAB — CBC
Hematocrit: 39.7 % (ref 34.0–46.6)
Hemoglobin: 13.3 g/dL (ref 11.1–15.9)
MCH: 29.1 pg (ref 26.6–33.0)
MCHC: 33.5 g/dL (ref 31.5–35.7)
MCV: 87 fL (ref 79–97)
Platelets: 313 10*3/uL (ref 150–450)
RBC: 4.57 x10E6/uL (ref 3.77–5.28)
RDW: 12.7 % (ref 11.7–15.4)
WBC: 8.9 10*3/uL (ref 3.4–10.8)

## 2020-08-07 LAB — LIPID PANEL
Chol/HDL Ratio: 4 ratio (ref 0.0–4.4)
Cholesterol, Total: 194 mg/dL (ref 100–199)
HDL: 49 mg/dL (ref >39)
LDL Chol Calc (NIH): 112 mg/dL — ABNORMAL HIGH (ref 0–99)
Triglycerides: 188 mg/dL — ABNORMAL HIGH (ref 0–149)
VLDL Cholesterol Cal: 33 mg/dL (ref 5–40)

## 2020-08-07 LAB — HIV ANTIBODY (ROUTINE TESTING W REFLEX): HIV Screen 4th Generation wRfx: NONREACTIVE

## 2020-08-08 ENCOUNTER — Other Ambulatory Visit: Payer: Self-pay | Admitting: Nurse Practitioner

## 2020-08-08 DIAGNOSIS — N939 Abnormal uterine and vaginal bleeding, unspecified: Secondary | ICD-10-CM

## 2020-08-08 NOTE — Telephone Encounter (Signed)
Requested Prescriptions  Pending Prescriptions Disp Refills  . TRI-SPRINTEC 0.18/0.215/0.25 MG-35 MCG tablet [Pharmacy Med Name: Tri-Sprintec 0.18/0.215/0.25 MG-35 MCG Oral Tablet] 84 tablet 3    Sig: Take 1 tablet by mouth daily     OB/GYN:  Contraceptives Passed - 08/08/2020  4:04 PM      Passed - Last BP in normal range    BP Readings from Last 1 Encounters:  08/06/20 133/83         Passed - Valid encounter within last 12 months    Recent Outpatient Visits          2 days ago Type 2 diabetes mellitus with hyperglycemia, with long-term current use of insulin [facility])   [place] [facility] [place], Shea Stakes, NP   8 months ago Encounter for annual physical exam   [facility] [place], [place] W, NP   9 months ago Type 2 diabetes mellitus with hyperglycemia, with long-term current use of insulin [facility])   [place] [facility] [place], [place] W, NP   9 months ago Type 2 diabetes mellitus with hyperglycemia, with long-term current use of insulin [facility])   [place] [facility] [place], Jeannett Senior L, RPH-CPP   10 months ago Type 2 diabetes mellitus with hyperglycemia, with long-term current use of insulin [facility])   [facility] [place], Cornelius Moras, RPH-CPP      Future Appointments            In 3 months Claiborne Rigg, NP [facility]           . liraglutide (VICTOZA) 18 MG/3ML SOPN [Pharmacy Med Name: Victoza 18 MG/3ML Subcutaneous Solution Pen-injector] 9 mL 0    Sig: WEEK 1: INJECT 0.6 MG ONCE DAILY INTO THE SKIN, DURING WEEK 2 :INCREASE TO 1.2 MG ONCE DAILY, WEEK 3: INCREASE TO 1.8 MG ONCE DAILY.     Endocrinology:  Diabetes - GLP-1 Receptor Agonists Failed - 08/08/2020  4:04 PM      Failed - HBA1C is between 0 and 7.9 and within 180 days    HbA1c, POC (controlled diabetic range)  Date Value Ref Range Status  08/06/2020 8.9 (A) 0.0 - 7.0  % Final         Passed - Valid encounter within last 6 months    Recent Outpatient Visits          2 days ago Type 2 diabetes mellitus with hyperglycemia, with long-term current use of insulin [facility]   [place] [facility] [place], Shea Stakes, NP   8 months ago Encounter for annual physical exam   [facility] [place], [place] W, NP   9 months ago Type 2 diabetes mellitus with hyperglycemia, with long-term current use of insulin [facility])   [place] [facility] [place], [place] W, NP   9 months ago Type 2 diabetes mellitus with hyperglycemia, with long-term current use of insulin [facility])   [place] [facility] [place], Jeannett Senior L, RPH-CPP   10 months ago Type 2 diabetes mellitus with hyperglycemia, with long-term current use of insulin [facility])   [facility] [place], Cornelius Moras, RPH-CPP      Future Appointments            In 3 months Claiborne Rigg, NP [employer] [facility]

## 2020-08-10 ENCOUNTER — Other Ambulatory Visit: Payer: Self-pay | Admitting: Nurse Practitioner

## 2020-08-10 DIAGNOSIS — N939 Abnormal uterine and vaginal bleeding, unspecified: Secondary | ICD-10-CM

## 2020-08-10 LAB — CERVICOVAGINAL ANCILLARY ONLY
Bacterial Vaginitis (gardnerella): NEGATIVE
Candida Glabrata: NEGATIVE
Candida Vaginitis: NEGATIVE
Chlamydia: NEGATIVE
Comment: NEGATIVE
Comment: NEGATIVE
Comment: NEGATIVE
Comment: NEGATIVE
Comment: NEGATIVE
Comment: NORMAL
Neisseria Gonorrhea: NEGATIVE
Trichomonas: NEGATIVE

## 2020-08-10 MED ORDER — VICTOZA 18 MG/3ML ~~LOC~~ SOPN: 1.8000 mg | PEN_INJECTOR | Freq: Every day | SUBCUTANEOUS | 2 refills | Status: DC

## 2020-08-10 NOTE — Telephone Encounter (Signed)
  Notes to clinic:  Needs PA    Requested Prescriptions  Pending Prescriptions Disp Refills   liraglutide (VICTOZA) 18 MG/3ML SOPN 9 mL 0    Sig: WEEK 1: INJECT 0.6 MG ONCE DAILY INTO THE SKIN, DURING WEEK 2 :INCREASE TO 1.2 MG ONCE DAILY, WEEK 3: INCREASE TO 1.8 MG ONCE DAILY.      Endocrinology:  Diabetes - GLP-1 Receptor Agonists Failed - 08/10/2020  9:39 AM      Failed - HBA1C is between 0 and 7.9 and within 180 days    HbA1c, POC (controlled diabetic range)  Date Value Ref Range Status  08/06/2020 8.9 (A) 0.0 - 7.0 % Final          Passed - Valid encounter within last 6 months    Recent Outpatient Visits           4 days ago Type 2 diabetes mellitus with hyperglycemia, with long-term current use of insulin [facility])   [place] [facility] [place], Shea Stakes, NP   8 months ago Encounter for annual physical exam   [facility] [place], [place] W, NP   9 months ago Type 2 diabetes mellitus with hyperglycemia, with long-term current use of insulin [facility])   [place] [facility] [place], [place] W, NP   9 months ago Type 2 diabetes mellitus with hyperglycemia, with long-term current use of insulin [facility])   [facility] [place], Jeannett Senior L, RPH-CPP   10 months ago Type 2 diabetes mellitus with hyperglycemia, with long-term current use of insulin [facility])   [facility] [place], Cornelius Moras, RPH-CPP       Future Appointments             In 3 months Claiborne Rigg, NP [employer] [facility]

## 2020-08-10 NOTE — Telephone Encounter (Signed)
Copied from CRM [id number]. Topic: Quick Communication - Rx Refill/Question >> Aug 10, 2020  9:33 AM Gaetana Michaelis A wrote: Medication: TRI-SPRINTEC 0.18/0.215/0.25 MG-35 MCG tablet   liraglutide (VICTOZA) 18 MG/3ML SOPN   Has the patient contacted their pharmacy? Yes.   (Agent: If no, request that the patient contact the pharmacy for the refill.) (Agent: If yes, when and what did the pharmacy advise?)  Preferred Pharmacy (with phone number or street name): [id number] - [place] ([place]), [place] - [address]  Phone:  [phone] Fax:  [phone]  Agent: Please be advised that RX refills may take up to 3 business days. We ask that you follow-up with your pharmacy.

## 2020-08-11 ENCOUNTER — Telehealth: Payer: Self-pay

## 2020-08-11 NOTE — Telephone Encounter (Signed)
PA for Victoza approved until 08/11/2021

## 2020-08-21 ENCOUNTER — Emergency Department (HOSPITAL_COMMUNITY)
Admission: EM | Admit: 2020-08-21 | Discharge: 2020-08-21 | Disposition: A | Discharge status: Home / Self Care | Payer: Medicaid Other | Attending: Emergency Medicine | Admitting: Emergency Medicine

## 2020-08-21 ENCOUNTER — Other Ambulatory Visit: Payer: Self-pay

## 2020-08-21 ENCOUNTER — Encounter (HOSPITAL_COMMUNITY): Payer: Self-pay | Admitting: Emergency Medicine

## 2020-08-21 DIAGNOSIS — E119 Type 2 diabetes mellitus without complications: Secondary | ICD-10-CM | POA: Insufficient documentation

## 2020-08-21 DIAGNOSIS — N939 Abnormal uterine and vaginal bleeding, unspecified: Secondary | ICD-10-CM

## 2020-08-21 DIAGNOSIS — Z794 Long term (current) use of insulin: Secondary | ICD-10-CM | POA: Insufficient documentation

## 2020-08-21 DIAGNOSIS — R1030 Lower abdominal pain, unspecified: Secondary | ICD-10-CM | POA: Diagnosis not present

## 2020-08-21 LAB — CBC WITH DIFFERENTIAL/PLATELET
Abs Immature Granulocytes: 0.07 10*3/uL (ref 0.00–0.07)
Basophils Absolute: 0.1 10*3/uL (ref 0.0–0.1)
Basophils Relative: 0 %
Eosinophils Absolute: 0.1 10*3/uL (ref 0.0–0.5)
Eosinophils Relative: 1 %
HCT: 37.7 % (ref 36.0–46.0)
Hemoglobin: 12.5 g/dL (ref 12.0–15.0)
Immature Granulocytes: 1 %
Lymphocytes Relative: 21 %
Lymphs Abs: 3 10*3/uL (ref 0.7–4.0)
MCH: 28.7 pg (ref 26.0–34.0)
MCHC: 33.2 g/dL (ref 30.0–36.0)
MCV: 86.5 fL (ref 80.0–100.0)
Monocytes Absolute: 0.7 10*3/uL (ref 0.1–1.0)
Monocytes Relative: 5 %
Neutro Abs: 10.6 10*3/uL — ABNORMAL HIGH (ref 1.7–7.7)
Neutrophils Relative %: 72 %
Platelets: 332 10*3/uL (ref 150–400)
RBC: 4.36 MIL/uL (ref 3.87–5.11)
RDW: 12.4 % (ref 11.5–15.5)
WBC: 14.5 10*3/uL — ABNORMAL HIGH (ref 4.0–10.5)
nRBC: 0 % (ref 0.0–0.2)

## 2020-08-21 LAB — COMPREHENSIVE METABOLIC PANEL
ALT: 26 U/L (ref 0–44)
AST: 25 U/L (ref 15–41)
Albumin: 3.7 g/dL (ref 3.5–5.0)
Alkaline Phosphatase: 67 U/L (ref 38–126)
Anion gap: 13 (ref 5–15)
BUN: 9 mg/dL (ref 6–20)
CO2: 23 mmol/L (ref 22–32)
Calcium: 9.4 mg/dL (ref 8.9–10.3)
Chloride: 101 mmol/L (ref 98–111)
Creatinine, Ser: 0.78 mg/dL (ref 0.44–1.00)
GFR, Estimated: >60 mL/min (ref >60)
Glucose, Bld: 198 mg/dL — ABNORMAL HIGH (ref 70–99)
Potassium: 3.8 mmol/L (ref 3.5–5.1)
Sodium: 137 mmol/L (ref 135–145)
Total Bilirubin: 0.5 mg/dL (ref 0.3–1.2)
Total Protein: 7 g/dL (ref 6.5–8.1)

## 2020-08-21 LAB — WET PREP, GENITAL
Clue Cells Wet Prep HPF POC: NONE SEEN
Sperm: NONE SEEN
Trich, Wet Prep: NONE SEEN
Yeast Wet Prep HPF POC: NONE SEEN

## 2020-08-21 LAB — RPR: RPR Ser Ql: NONREACTIVE

## 2020-08-21 LAB — I-STAT BETA HCG BLOOD, ED (MC, WL, AP ONLY): I-stat hCG, quantitative: <5 m[IU]/mL (ref <5)

## 2020-08-21 LAB — HIV ANTIBODY (ROUTINE TESTING W REFLEX): HIV Screen 4th Generation wRfx: NONREACTIVE

## 2020-08-21 MED ORDER — ONDANSETRON 4 MG PO TBDP
4.0000 mg | ORAL_TABLET | Freq: Once | ORAL | Status: AC
  Administered 2020-08-21: 4 mg via ORAL
  Filled 2020-08-21: qty 1

## 2020-08-21 MED ORDER — SODIUM CHLORIDE 0.9 % IV BOLUS
1000.0000 mL | Freq: Once | INTRAVENOUS | Status: AC
  Administered 2020-08-21: 1000 mL via INTRAVENOUS

## 2020-08-21 MED ORDER — KETOROLAC TROMETHAMINE 15 MG/ML IJ SOLN
15.0000 mg | Freq: Once | INTRAMUSCULAR | Status: AC
  Administered 2020-08-21: 15 mg via INTRAVENOUS
  Filled 2020-08-21: qty 1

## 2020-08-21 MED ORDER — NAPROXEN 500 MG PO TABS: 500.0000 mg | ORAL_TABLET | Freq: Two times a day (BID) | ORAL | 0 refills | Status: DC | PRN

## 2020-08-21 NOTE — Discharge Instructions (Addendum)
You were seen in the emergency department today for vaginal bleeding.  Your labs were overall reassuring.  Your pregnancy test was negative.  We are sending you home with naproxen to help with pain.  - Naproxen is a nonsteroidal anti-inflammatory medication that will help with pain and swelling. Be sure to take this medication as prescribed with food, 1 pill every 12 hours,  It should be taken with food, as it can cause stomach upset, and more seriously, stomach bleeding. Do not take other nonsteroidal anti-inflammatory medications with this such as Advil, Motrin, Aleve, Mobic, Goodie Powder, or Motrin.    You make take Tylenol per over the counter dosing with these medications.   We have prescribed you new medication(s) today. Discuss the medications prescribed today with your pharmacist as they can have adverse effects and interactions with your other medicines including over the counter and prescribed medications. Seek medical evaluation if you start to experience new or abnormal symptoms after taking one of these medicines, seek care immediately if you start to experience difficulty breathing, feeling of your throat closing, facial swelling, or rash as these could be indications of a more serious allergic reaction  Please follow-up with OB/GYN within 3 days for a recheck of your bleeding and your symptoms.  Return to the emergency department for new or worsening symptoms including but not limited to worsening bleeding, lightheadedness, dizziness, passing out, new or worsening pain, fever, or any other concerns.

## 2020-08-21 NOTE — ED Provider Notes (Signed)
[place] EMERGENCY DEPARTMENT Provider Note   CSN: [id number] Arrival date & time: 08/21/20  6384     History Chief Complaint  Patient presents with   Vaginal Bleeding    Melissa Gilmore is a 25 y.o. female with a history of schizophrenia, DJD, depression, and diabetes mellitus who presents to the emergency department with complaints of vaginal bleeding that began around 9 PM.  Patient states that the bleeding started shortly after having intercourse with her significant other, she started passing clots which concerned her.  She is having some associated generalized abdominal cramping.  No alleviating or aggravating factors to her symptoms.  She states that she has only had intercourse 3 times and has had bleeding each time afterwards, this was worse however though.  Her last menstrual period was at the beginning of this month, she is on birth control, she typically has very light periods with minimal bleeding.  She has a little bit of nausea.  She denies fever, chills, dizziness, syncope, vaginal discharge, or dysuria.  She denies concern for STDs.  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   Past Surgical History:  Procedure Laterality Date   NO PAST SURGERIES       OB History   No obstetric history on file.     Family History  Problem Relation Age of Onset   Diabetes Maternal Grandmother    Stomach cancer Maternal Grandmother     Social History   Tobacco Use   Smoking status: Never   Smokeless tobacco: Never  Substance Use Topics   Alcohol use: Not Currently   Drug use: Not Currently    Home Medications Prior to Admission medications   Medication Sig Start Date End Date Taking? Authorizing Provider  Accu-Chek Softclix Lancets lancets Use to check blood  sugar twice daily. Dx E11.65 08/06/20   Charlott Rakes, MD  Blood Glucose Monitoring Suppl (ACCU-CHEK GUIDE ME) w/Device KIT Use to check blood sugar twice daily. Dx E11.65 08/06/20   Charlott Rakes, MD  chlorhexidine (HIBICLENS) 4 % external liquid Apply topically daily as needed. After clindamycin is completed 12/03/19   Gildardo Pounds, NP  clindamycin (CLEOCIN-T) 1 % external solution Apply topically 2 (two) times daily. Apply to underarms as directed 12/03/19   Gildardo Pounds, NP  glucose blood (ACCU-CHEK GUIDE) test strip Use to check blood sugar twice daily. Dx E11.65 08/06/20   Charlott Rakes, MD  insulin glargine (LANTUS SOLOSTAR) 100 UNIT/ML Solostar Pen Inject 20 Units into the skin daily. 08/06/20 11/04/20  Gildardo Pounds, NP  Insulin Pen Needle (B-D UF III MINI PEN NEEDLES) 31G X 5 MM MISC Use as instructed. Inject into the skin once daily 08/06/20   Gildardo Pounds, NP  liraglutide (VICTOZA) 18 MG/3ML SOPN Inject 1.8 mg into the skin daily. 08/10/20   Charlott Rakes, MD  lisinopril (ZESTRIL) 5 MG tablet Take 1 tablet (5 mg total) by mouth daily. 07/12/19   Gildardo Pounds, NP  TRI-SPRINTEC 0.18/0.215/0.25 MG-35 MCG tablet Take 1 tablet by mouth daily 08/08/20   Gildardo Pounds, NP    Allergies    Patient has no known allergies.  Review of Systems   Review of Systems  Constitutional:  Negative for chills and fever.  Respiratory:  Negative for shortness of breath.   Cardiovascular:  Negative for chest pain.  Gastrointestinal:  Positive for abdominal pain and nausea. Negative for diarrhea and vomiting.  Genitourinary:  Positive for vaginal bleeding. Negative for vaginal discharge.  Neurological:  Negative for dizziness and syncope.  All other systems reviewed and are negative.  Physical Exam Updated Vital Signs BP 112/67   Pulse 87   Temp 98 F (36.7 C) (Oral)   Resp (!) 9   Ht '5\' 4"'  (1.626 m)   Wt 115 kg   LMP 08/07/2020   SpO2 98%   BMI 43.52 kg/m   Physical  Exam Vitals and nursing note reviewed. Exam conducted with a chaperone present.  Constitutional:      General: She is not in acute distress.    Appearance: She is well-developed. She is not toxic-appearing.  HENT:     Head: Normocephalic and atraumatic.  Eyes:     General:        Right eye: No discharge.        Left eye: No discharge.     Conjunctiva/sclera: Conjunctivae normal.  Cardiovascular:     Rate and Rhythm: Normal rate and regular rhythm.  Pulmonary:     Effort: Pulmonary effort is normal. No respiratory distress.     Breath sounds: Normal breath sounds. No wheezing, rhonchi or rales.  Abdominal:     General: There is no distension.     Palpations: Abdomen is soft.     Tenderness: There is no abdominal tenderness (mild suprapubic tenderness.). There is no guarding or rebound.  Genitourinary:    Comments: No external wounds noted. Speculum exam with blood clots present in vaginal canal.  There is no visible area of active bleeding.  No visualized wounds, specifically no large lacerations.  Exam is slightly limited with body habitus, however patient did not have significant pain or bleeding with speculum exam.  No cervical motion tenderness or palpable masses on bimanual exam. Musculoskeletal:     Cervical back: Neck supple.  Skin:    General: Skin is warm and dry.     Findings: No rash.  Neurological:     Mental Status: She is alert.     Comments: Clear speech.   Psychiatric:        Behavior: Behavior normal.    ED Results / Procedures / Treatments   Labs (all labs ordered are listed, but only abnormal results are displayed) Labs Reviewed  WET PREP, GENITAL - Abnormal; Notable for the following components:      Result Value   WBC, Wet Prep HPF POC FEW (*)    All other components within normal limits  CBC WITH DIFFERENTIAL/PLATELET - Abnormal; Notable for the following components:   WBC 14.5 (*)    Neutro Abs 10.6 (*)    All other components within normal limits   COMPREHENSIVE METABOLIC PANEL - Abnormal; Notable for the following components:   Glucose, Bld 198 (*)    All other components within normal limits  URINALYSIS, ROUTINE W REFLEX MICROSCOPIC  RPR  HIV ANTIBODY (ROUTINE TESTING W REFLEX)  I-STAT BETA HCG BLOOD, ED ([facility], [facility], [facility] ONLY)  GC/CHLAMYDIA PROBE AMP ([place]) NOT AT [facility]    EKG EKG Interpretation  Date/Time:  Saturday August 21 2020 01:06:42 EDT Ventricular Rate:  92 PR Interval:  166 QRS Duration: 81 QT Interval:  357 QTC Calculation: 442 R Axis:   83 Text Interpretation: Sinus rhythm Normal ECG No old tracing to  compare Confirmed by Delora Fuel ([id number]) on 08/21/2020 6:01:14 AM  Radiology No results found.  Procedures Procedures   Medications Ordered in ED Medications  ondansetron (ZOFRAN-ODT) disintegrating tablet 4 mg (4 mg Oral Given 08/21/20 0110)  sodium chloride 0.9 % bolus 1,000 mL (0 mLs Intravenous Stopped 08/21/20 0138)  ketorolac (TORADOL) 15 MG/ML injection 15 mg (15 mg Intravenous Given 08/21/20 0454)    ED Course  I have reviewed the triage vital signs and the nursing notes.  Pertinent labs & imaging results that were available during my care of the patient were reviewed by me and considered in my medical decision making (see chart for details).    MDM Rules/Calculators/A&P                          Patient presents to the ED with complaints of vaginal bleeding and abdominal cramping after intercourse tonight.  Patient is nontoxic, resting comfortably, vitals on my assessment are within normal limits.  Mild suprapubic tenderness.  Pelvic exam was performed with chaperone present, blood clots present in vaginal canal, without visible active bleeding, no visualized large lacerations or wounds.   Additional history obtained:  Additional history obtained from chart review & nursing note review.   Lab Tests:  I Ordered, reviewed, and interpreted labs, which included:  CBC: Mild leukocytosis.  No  significant anemia.  Hemoglobin and hematocrit are within normal limits. CMP: Hyperglycemia without acidosis or anion gap elevation. Pregnancy test: Negative Wet prep: Few WBCs, no sperm, clue cells, trichomoniasis, or yeast. STD testing pending.  ED Course:  Patient presents to the ED with complaints of vaginal bleeding and pelvic cramping after intercourse tonight.  This has happened previously.  On exam patient does have some clots present in her vaginal canal, however there is no active/brisk bleeding present.  No visualized large lacerations or pulsatile/arterial bleeding.  Pregnancy test is negative therefore do not suspect ectopic pregnancy or spontaneous abortion.  She has no urinary symptoms to raise concern for UTI therefore urinalysis was deferred.  No unilateral pain to raise concern for ovarian torsion.  No peritoneal signs on abdominal exam to raise concern for acute surgical abdomen.  Patient is feeling better following fluids and Toradol.  She states that the bleeding has significantly slowed down.  Vital signs are overall reassuring, she has 1 heart rate documented of 134, however since my assessment of the patient she has remained with heart rates in the 70s to 90s.  Will discharge home with naproxen and instructions to follow-up very closely with OB/GYN with strict return precautions.  I discussed results, treatment plan, need for follow-up, and return precautions with the patient. Provided opportunity for questions, patient confirmed understanding and is in agreement with plan.   Findings and plan of care discussed with supervising physician Dr. Roxanne Mins who has provided guidance & is in agreement.   Portions of this note were generated with [employer]. Dictation errors may occur despite best attempts at proofreading.  Final Clinical Impression(s) / ED Diagnoses Final diagnoses:  Vaginal bleeding    Rx / DC Orders ED Discharge Orders          Ordered     naproxen (NAPROSYN) 500 MG tablet  2 times daily PRN        08/21/20 0637             Amaryllis Dyke, PA-C 35/78/97 [id number]    Delora Fuel, MD 41/28/20 [id number]

## 2020-08-21 NOTE — ED Triage Notes (Signed)
Patient reports heavy vaginal bleeding this evening with low abdominal cramping , no emesis or diarrhea , denies fever or chills .

## 2020-08-23 LAB — GC/CHLAMYDIA PROBE AMP (~~LOC~~) NOT AT ARMC
Chlamydia: NEGATIVE
Comment: NEGATIVE
Comment: NORMAL
Neisseria Gonorrhea: NEGATIVE

## 2020-10-15 ENCOUNTER — Other Ambulatory Visit: Payer: Self-pay

## 2020-10-15 ENCOUNTER — Encounter: Payer: Self-pay | Admitting: Emergency Medicine

## 2020-10-15 ENCOUNTER — Ambulatory Visit
Admission: EM | Admit: 2020-10-15 | Discharge: 2020-10-15 | Disposition: A | Discharge status: Home / Self Care | Payer: Medicaid Other | Attending: Physician Assistant | Admitting: Physician Assistant

## 2020-10-15 DIAGNOSIS — R0981 Nasal congestion: Secondary | ICD-10-CM

## 2020-10-15 DIAGNOSIS — R059 Cough, unspecified: Secondary | ICD-10-CM

## 2020-10-15 DIAGNOSIS — J069 Acute upper respiratory infection, unspecified: Secondary | ICD-10-CM | POA: Diagnosis not present

## 2020-10-15 DIAGNOSIS — H9202 Otalgia, left ear: Secondary | ICD-10-CM

## 2020-10-15 DIAGNOSIS — Z20822 Contact with and (suspected) exposure to covid-19: Secondary | ICD-10-CM

## 2020-10-15 DIAGNOSIS — H6992 Unspecified Eustachian tube disorder, left ear: Secondary | ICD-10-CM

## 2020-10-15 MED ORDER — FLUTICASONE PROPIONATE 50 MCG/ACT NA SUSP: 1.0000 | Freq: Every day | NASAL | 0 refills | Status: DC

## 2020-10-15 NOTE — ED Triage Notes (Signed)
Left ear pain x 2 days. States she gets recurrent ear infections. Also complaining of nasal congestion, mild cough and sore throat starting at onset of ear pain.

## 2020-10-15 NOTE — ED Provider Notes (Signed)
[facility]    CSN: [id number] Arrival date & time: 10/15/20  1236      History   Chief Complaint Chief Complaint  Patient presents with   Otalgia   Sore Throat    HPI Melissa Gilmore is a 25 y.o. female.   Patient presents today with a 2-day history of URI symptoms.  Reports sore throat, nasal congestion, cough, left otalgia.  Denies any chest pain, fever, nausea, vomiting, shortness of breath.  Denies any known sick contacts.  She does have a history of recurrent ear infections and states current symptoms are similar to previous episodes of this condition.  Denies any antibiotic use in the past 3 months.  She has tried over-the-counter multisymptom medication without improvement of symptoms.  She is up-to-date on vaccinations and has had COVID vaccines but not yet had booster.  She denies history of asthma or allergies.   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   Past Surgical History:  Procedure Laterality Date   NO PAST SURGERIES      OB History   No obstetric history on file.      Home Medications    Prior to Admission medications   Medication Sig Start Date End Date Taking? Authorizing Provider  fluticasone (FLONASE) 50 MCG/ACT nasal spray Place 1 spray into both nostrils daily. 10/15/20  Yes Quadarius Henton K, PA-C  Accu-Chek Softclix Lancets lancets Use to check blood sugar twice daily. Dx E11.65 08/06/20   Charlott Rakes, MD  Blood Glucose Monitoring Suppl (ACCU-CHEK GUIDE ME) w/Device KIT Use to check blood sugar twice daily. Dx E11.65 08/06/20   Charlott Rakes, MD  chlorhexidine (HIBICLENS) 4 % external liquid Apply topically daily as needed. After clindamycin is completed 12/03/19   Gildardo Pounds, NP  clindamycin (CLEOCIN-T) 1 % external solution Apply topically 2 (two)  times daily. Apply to underarms as directed 12/03/19   Gildardo Pounds, NP  glucose blood (ACCU-CHEK GUIDE) test strip Use to check blood sugar twice daily. Dx E11.65 08/06/20   Charlott Rakes, MD  insulin glargine (LANTUS SOLOSTAR) 100 UNIT/ML Solostar Pen Inject 20 Units into the skin daily. 08/06/20 11/04/20  Gildardo Pounds, NP  Insulin Pen Needle (B-D UF III MINI PEN NEEDLES) 31G X 5 MM MISC Use as instructed. Inject into the skin once daily 08/06/20   Gildardo Pounds, NP  liraglutide (VICTOZA) 18 MG/3ML SOPN Inject 1.8 mg into the skin daily. 08/10/20   Charlott Rakes, MD  lisinopril (ZESTRIL) 5 MG tablet Take 1 tablet (5 mg total) by mouth daily. 07/12/19   Gildardo Pounds, NP  naproxen (NAPROSYN) 500 MG tablet Take 1 tablet (500 mg total) by mouth 2 (two) times daily as needed for moderate pain. 08/21/20   Petrucelli, Glynda Jaeger, PA-C  TRI-SPRINTEC 0.18/0.215/0.25 MG-35 MCG tablet Take 1 tablet by mouth daily 08/08/20   Gildardo Pounds, NP    Family History Family History  Problem Relation Age of Onset   Diabetes Maternal Grandmother    Stomach cancer Maternal Grandmother     Social History Social History   Tobacco Use   Smoking status: Never   Smokeless tobacco: Never  Substance Use Topics   Alcohol use: Not Currently   Drug use: Not Currently     Allergies  Patient has no known allergies.   Review of Systems Review of Systems  Constitutional:  Positive for activity change. Negative for appetite change, fatigue and fever.  HENT:  Positive for congestion, ear pain, postnasal drip and sore throat. Negative for sinus pressure and sneezing.   Respiratory:  Positive for cough. Negative for shortness of breath.   Cardiovascular:  Negative for chest pain.  Gastrointestinal:  Negative for abdominal pain, diarrhea, nausea and vomiting.  Musculoskeletal:  Negative for arthralgias and myalgias.  Neurological:  Positive for headaches. Negative for dizziness and light-headedness.     Physical Exam Triage Vital Signs ED Triage Vitals [10/15/20 1345]  Enc Vitals Group     BP (!) 151/90     Pulse Rate 74     Resp 16     Temp 98.4 F (36.9 C)     Temp Source Oral     SpO2 97 %     Weight      Height      Head Circumference      Peak Flow      Pain Score 7     Pain Loc      Pain Edu?      Excl. in [place]?    No data found.  Updated Vital Signs BP (!) 151/90 (BP Location: Left Arm)   Pulse 74   Temp 98.4 F (36.9 C) (Oral)   Resp 16   SpO2 97%   Visual Acuity Right Eye Distance:   Left Eye Distance:   Bilateral Distance:    Right Eye Near:   Left Eye Near:    Bilateral Near:     Physical Exam Vitals reviewed.  Constitutional:      General: She is awake. She is not in acute distress.    Appearance: Normal appearance. She is not ill-appearing.     Comments: Very pleasant female appears stated age no acute distress sitting comfortably in exam room  HENT:     Head: Normocephalic and atraumatic.     Right Ear: Tympanic membrane, ear canal and external ear normal. Tympanic membrane is not erythematous or bulging.     Left Ear: Ear canal and external ear normal. Tympanic membrane is retracted.     Nose:     Right Sinus: No maxillary sinus tenderness or frontal sinus tenderness.     Left Sinus: No maxillary sinus tenderness or frontal sinus tenderness.     Mouth/Throat:     Pharynx: Uvula midline. Posterior oropharyngeal erythema present. No oropharyngeal exudate.     Comments: Moderate erythema posterior pharynx Cardiovascular:     Rate and Rhythm: Normal rate and regular rhythm.     Heart sounds: Normal heart sounds, S1 normal and S2 normal. Heart sounds not distant. No murmur heard. Pulmonary:     Effort: Pulmonary effort is normal.     Breath sounds: Normal breath sounds. No wheezing, rhonchi or rales.     Comments: Clear to auscultation bilaterally Psychiatric:        Behavior: Behavior is cooperative.     UC Treatments / Results   Labs (all labs ordered are listed, but only abnormal results are displayed) Labs Reviewed  NOVEL CORONAVIRUS, NAA    EKG   Radiology No results found.  Procedures Procedures (including critical care time)  Medications Ordered in UC Medications - No data to display  Initial Impression / Assessment and Plan / UC Course  I have reviewed the triage vital signs and the nursing notes.  Pertinent labs & imaging results that were available during my care of the patient were reviewed by me and considered in my medical decision making (see chart for details).      No evidence of acute infection that warrant initiation of antibiotics at this time.  Discussed likely viral etiology.  COVID test is pending.  Patient was encouraged use over-the-counter medications including Mucinex, Tylenol, Flonase for additional symptom relief.  Discussed that ear does not appear to be infected and retracted position is most likely related to eustachian tube dysfunction.  Discussed the importance of using Flonase regularly to help manage this patient develops any worsening symptoms she should be reevaluated.  She was provided school excuse note with current CDC return to school guidelines.  Discussed that if she has any worsening or persistent symptoms she is to return for reevaluation.  Strict return precautions given to which patient expressed understanding.  Final Clinical Impressions(s) / UC Diagnoses   Final diagnoses:  Encounter for screening laboratory testing for COVID-19 virus  Upper respiratory tract infection, unspecified type  Nasal congestion  Cough  Acute otalgia, left  Disorder of left eustachian tube     Discharge Instructions      Your ear does not appear infected at this time.  I believe that you have a problem with your eustachian tube and so I think Flonase is the best treatment at this time.  I believe that you have a virus.  We will contact you if your COVID test is positive.   Please use over-the-counter medications including Tylenol, Mucinex, Flonase for symptom relief.  If anything worsens please return for reevaluation.  Follow-up with your primary care provider within a week to ensure symptom improvement.     ED Prescriptions     Medication Sig Dispense Auth. Provider   fluticasone (FLONASE) 50 MCG/ACT nasal spray Place 1 spray into both nostrils daily. 16 g Kenniya Westrich K, PA-C      PDMP not reviewed this encounter.   Terrilee Croak, PA-C 10/15/20 1406

## 2020-10-15 NOTE — Discharge Instructions (Addendum)
Your ear does not appear infected at this time.  I believe that you have a problem with your eustachian tube and so I think Flonase is the best treatment at this time.  I believe that you have a virus.  We will contact you if your COVID test is positive.  Please use over-the-counter medications including Tylenol, Mucinex, Flonase for symptom relief.  If anything worsens please return for reevaluation.  Follow-up with your primary care provider within a week to ensure symptom improvement.

## 2020-10-16 LAB — SARS-COV-2, NAA 2 DAY TAT

## 2020-10-16 LAB — NOVEL CORONAVIRUS, NAA: SARS-CoV-2, NAA: NOT DETECTED

## 2020-10-20 ENCOUNTER — Telehealth: Payer: Self-pay | Admitting: Emergency Medicine

## 2020-10-20 MED ORDER — FLUTICASONE PROPIONATE 50 MCG/ACT NA SUSP: 1.0000 | Freq: Every day | NASAL | 0 refills | Status: DC

## 2020-11-08 ENCOUNTER — Other Ambulatory Visit: Payer: Self-pay

## 2020-11-08 ENCOUNTER — Ambulatory Visit: Payer: Medicaid Other | Attending: Nurse Practitioner | Admitting: Nurse Practitioner

## 2020-11-08 ENCOUNTER — Encounter: Payer: Self-pay | Admitting: Nurse Practitioner

## 2020-11-08 VITALS — BP 159/100 | HR 81 | Ht 64.0 in | Wt 227.2 lb

## 2020-11-08 DIAGNOSIS — Z79899 Other long term (current) drug therapy: Secondary | ICD-10-CM | POA: Insufficient documentation

## 2020-11-08 DIAGNOSIS — I10 Essential (primary) hypertension: Secondary | ICD-10-CM

## 2020-11-08 DIAGNOSIS — Z794 Long term (current) use of insulin: Secondary | ICD-10-CM | POA: Diagnosis not present

## 2020-11-08 DIAGNOSIS — Z7985 Long-term (current) use of injectable non-insulin antidiabetic drugs: Secondary | ICD-10-CM | POA: Diagnosis not present

## 2020-11-08 DIAGNOSIS — Z833 Family history of diabetes mellitus: Secondary | ICD-10-CM | POA: Diagnosis not present

## 2020-11-08 DIAGNOSIS — D72829 Elevated white blood cell count, unspecified: Secondary | ICD-10-CM

## 2020-11-08 DIAGNOSIS — E1165 Type 2 diabetes mellitus with hyperglycemia: Secondary | ICD-10-CM | POA: Diagnosis present

## 2020-11-08 LAB — POCT GLYCOSYLATED HEMOGLOBIN (HGB A1C): Hemoglobin A1C: 8.1 % — AB (ref 4.0–5.6)

## 2020-11-08 LAB — GLUCOSE, POCT (MANUAL RESULT ENTRY): POC Glucose: 281 mg/dl — AB (ref 70–99)

## 2020-11-08 MED ORDER — LANTUS SOLOSTAR 100 UNIT/ML ~~LOC~~ SOPN: 20.0000 [IU] | PEN_INJECTOR | Freq: Every day | SUBCUTANEOUS | 1 refills | Status: DC

## 2020-11-08 MED ORDER — BD PEN NEEDLE MINI U/F 31G X 5 MM MISC: 3 refills | Status: DC

## 2020-11-08 MED ORDER — VICTOZA 18 MG/3ML ~~LOC~~ SOPN: 1.8000 mg | PEN_INJECTOR | Freq: Every day | SUBCUTANEOUS | 2 refills | Status: DC

## 2020-11-08 NOTE — Progress Notes (Signed)
Assessment & Plan:  Melissa Gilmore was seen today for diabetes.  Diagnoses and all orders for this visit:  Type 2 diabetes mellitus with hyperglycemia, with long-term current use of insulin (HCC) -     POCT glucose (manual entry) -     POCT glycosylated hemoglobin (Hb A1C) -     insulin glargine (LANTUS SOLOSTAR) 100 UNIT/ML Solostar Pen; Inject 20 Units into the skin daily. -     liraglutide (VICTOZA) 18 MG/3ML SOPN; Inject 1.8 mg into the skin daily. -     Insulin Pen Needle (B-D UF III MINI PEN NEEDLES) 31G X 5 MM MISC; Use as instructed. Inject into the skin once daily Continue blood sugar control as discussed in office today, low carbohydrate diet, and regular physical exercise as tolerated, 150 minutes per week (30 min each day, 5 days per week, or 50 min 3 days per week). Keep blood sugar logs with fasting goal of 90-130 mg/dl, post prandial (after you eat) less than 180.  For Hypoglycemia: BS <60 and Hyperglycemia BS >400; contact the clinic ASAP. Annual eye exams and foot exams are recommended.   Essential hypertension Will restart lisinopril 5 mg today   DASH/Mediterranean Diets are healthier choices for HTN.    Leukocytosis, unspecified type -     CBC with Differential   Patient has been counseled on age-appropriate routine health concerns for screening and prevention. These are reviewed and up-to-date. Referrals have been placed accordingly. Immunizations are up-to-date or declined.    Subjective:   Chief Complaint  Patient presents with   Diabetes   Diabetes Pertinent negatives for hypoglycemia include no dizziness, headaches or seizures. Pertinent negatives for diabetes include no blurred vision, no chest pain and no weight loss.  Melissa Gilmore 25 y.o. female presents to office today for follow up for DM and HTN.   She has a past medical history of Depression, Diabetes mellitus without complication, Dissociative identity disorder, and Schizophrenia.    Endorses  increased stress with school.   She is having trouble losing weight. Wants to know if she has PCOS. Currently taking OCPs.    DM is not well controlled. Her lantus is showing as expired. There may be a component of non adherence. She is also prescribed victoza 1.8 mg daily. BMI 39 LDL not at goal. Weight is down 6lbs since last office visit. She is walking daily but diet is not optimal as she is also missing meals during the day Lab Results  Component Value Date   HGBA1C 8.9 (A) 08/06/2020    Lab Results  Component Value Date   HGBA1C 8.9 (A) 08/06/2020    Lab Results  Component Value Date   LDLCALC 112 (H) 08/06/2020     She has not been taking lisinopril as prescribed. She has been instructed to start taking and will return in a few weeks for BP check. Denies chest pain, shortness of breath, palpitations, lightheadedness, dizziness, headaches or BLE edema.  Endorses feeling irritated with her mother today as she was supposed to bring her to her appointment today but instead she had to have an uber bring her because her mother forgot.  BP Readings from Last 3 Encounters:  11/08/20 (!) 159/100  10/15/20 (!) 151/90  08/21/20 129/65      Review of Systems  Constitutional:  Negative for fever, malaise/fatigue and weight loss.  HENT: Negative.  Negative for nosebleeds.   Eyes: Negative.  Negative for blurred vision, double vision and photophobia.  Respiratory: Negative.  Negative for cough and shortness of breath.   Cardiovascular: Negative.  Negative for chest pain, palpitations and leg swelling.  Gastrointestinal: Negative.  Negative for heartburn, nausea and vomiting.  Musculoskeletal: Negative.  Negative for myalgias.  Neurological: Negative.  Negative for dizziness, focal weakness, seizures and headaches.  Psychiatric/Behavioral: Negative.  Negative for suicidal ideas.    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   Outpatient Medications Prior to Visit  Medication Sig Dispense Refill   Accu-Chek Softclix Lancets lancets Use to check blood sugar twice daily. Dx E11.65 100 each 3   Blood Glucose Monitoring Suppl (ACCU-CHEK GUIDE ME) w/Device KIT Use to check blood sugar twice daily. Dx E11.65 1 kit 0   chlorhexidine (HIBICLENS) 4 % external liquid Apply topically daily as needed. After clindamycin is completed 240 mL 2   clindamycin (CLEOCIN-T) 1 % external solution Apply topically 2 (two) times daily. Apply to underarms as directed 60 mL 0   glucose blood (ACCU-CHEK GUIDE) test strip Use to check blood sugar twice daily. Dx E11.65 100 each 12   lisinopril (ZESTRIL) 5 MG tablet Take 1 tablet (5 mg total) by mouth daily. 90 tablet 3   naproxen (NAPROSYN) 500 MG tablet Take 1 tablet (500 mg total) by mouth 2 (two) times daily as needed for moderate pain. 15 tablet 0   TRI-SPRINTEC 0.18/0.215/0.25 MG-35 MCG tablet Take 1 tablet by mouth daily 84 tablet 3   Insulin Pen Needle (B-D UF III MINI PEN NEEDLES) 31G X 5 MM MISC Use as instructed. Inject into the skin once daily 100 each 3   liraglutide (VICTOZA) 18 MG/3ML SOPN Inject 1.8 mg into the skin daily. 9 mL 2   fluticasone (FLONASE) 50 MCG/ACT nasal spray Place 1 spray into both nostrils daily. (Patient not taking: Reported on 11/08/2020) 16 g 0   insulin glargine (LANTUS SOLOSTAR) 100 UNIT/ML Solostar Pen Inject 20 Units into the skin daily. 18 mL 1   No facility-administered medications prior to visit.    No Known Allergies     Objective:    BP (!) 159/100   Pulse 81   Ht '5\' 4"'  (1.626 m)   Wt 227 lb 4 oz (103.1 kg)   SpO2  100%   BMI 39.01 kg/m  Wt Readings from Last 3 Encounters:  11/08/20 227 lb 4 oz (103.1 kg)  08/21/20 253 lb 8.5 oz (115 kg)  08/06/20 233 lb 3.2 oz (105.8 kg)    Physical Exam Vitals and nursing note reviewed.  Constitutional:      Appearance: She is well-developed.  HENT:     Head: Normocephalic and atraumatic.  Cardiovascular:     Rate and Rhythm: Normal rate and regular rhythm.     Heart sounds: Normal heart sounds. No murmur heard.   No friction rub. No gallop.  Pulmonary:     Effort: Pulmonary effort is normal. No tachypnea or respiratory distress.     Breath sounds: Normal breath sounds. No decreased breath sounds, wheezing, rhonchi or rales.  Chest:  Chest wall: No tenderness.  Abdominal:     General: Bowel sounds are normal.     Palpations: Abdomen is soft.  Musculoskeletal:        General: Normal range of motion.     Cervical back: Normal range of motion.  Skin:    General: Skin is warm and dry.  Neurological:     Mental Status: She is alert and oriented to person, place, and time.     Coordination: Coordination normal.  Psychiatric:        Behavior: Behavior normal. Behavior is cooperative.        Thought Content: Thought content normal.        Judgment: Judgment normal.         Patient has been counseled extensively about nutrition and exercise as well as the importance of adherence with medications and regular follow-up. The patient was given clear instructions to go to ER or return to medical center if symptoms don't improve, worsen or new problems develop. The patient verbalized understanding.   Follow-up: Return in about 3 weeks (around 11/29/2020) for BP CHECK WITH LUKE.   Gildardo Pounds, FNP-BC [facility] [place], [place]   11/08/2020, 2:47 PM

## 2020-11-09 LAB — CBC WITH DIFFERENTIAL/PLATELET
Basophils Absolute: 0 10*3/uL (ref 0.0–0.2)
Basos: 1 %
EOS (ABSOLUTE): 0.1 10*3/uL (ref 0.0–0.4)
Eos: 2 %
Hematocrit: 38.3 % (ref 34.0–46.6)
Hemoglobin: 12.7 g/dL (ref 11.1–15.9)
Immature Grans (Abs): 0 10*3/uL (ref 0.0–0.1)
Immature Granulocytes: 0 %
Lymphocytes Absolute: 2.9 10*3/uL (ref 0.7–3.1)
Lymphs: 37 %
MCH: 27.9 pg (ref 26.6–33.0)
MCHC: 33.2 g/dL (ref 31.5–35.7)
MCV: 84 fL (ref 79–97)
Monocytes Absolute: 0.4 10*3/uL (ref 0.1–0.9)
Monocytes: 5 %
Neutrophils Absolute: 4.4 10*3/uL (ref 1.4–7.0)
Neutrophils: 55 %
Platelets: 271 10*3/uL (ref 150–450)
RBC: 4.56 x10E6/uL (ref 3.77–5.28)
RDW: 11.9 % (ref 11.7–15.4)
WBC: 7.8 10*3/uL (ref 3.4–10.8)

## 2020-11-26 ENCOUNTER — Ambulatory Visit
Admission: EM | Admit: 2020-11-26 | Discharge: 2020-11-26 | Disposition: A | Discharge status: Home / Self Care | Payer: Medicaid Other | Attending: Internal Medicine | Admitting: Internal Medicine

## 2020-11-26 ENCOUNTER — Other Ambulatory Visit: Payer: Self-pay

## 2020-11-26 DIAGNOSIS — L03213 Periorbital cellulitis: Secondary | ICD-10-CM | POA: Diagnosis not present

## 2020-11-26 MED ORDER — CEFDINIR 300 MG PO CAPS: 300.0000 mg | ORAL_CAPSULE | Freq: Two times a day (BID) | ORAL | 0 refills | Status: AC

## 2020-11-26 NOTE — ED Provider Notes (Signed)
[facility]    CSN: [id number] Arrival date & time: 11/26/20  2035      History   Chief Complaint Chief Complaint  Patient presents with   Eyelid Pain    HPI Melissa Gilmore is a 25 y.o. female.   Patient presents with right eyelid swelling, pain, erythema that started approximately 2 days ago.  Patient denies any blurry vision, trauma to the eye, any foreign bodies to the eye.  Denies any drainage from the eye.  Denies any fever or upper respiratory symptoms.  Denies any pain with extraocular movements.    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   Past Surgical History:  Procedure Laterality Date   NO PAST SURGERIES      OB History   No obstetric history on file.      Home Medications    Prior to Admission medications   Medication Sig Start Date End Date Taking? Authorizing Provider  cefdinir (OMNICEF) 300 MG capsule Take 1 capsule (300 mg total) by mouth 2 (two) times daily for 10 days. 11/26/20 12/06/20 Yes , Michele Rockers, FNP  Accu-Chek Softclix Lancets lancets Use to check blood sugar twice daily. Dx E11.65 08/06/20   Charlott Rakes, MD  Blood Glucose Monitoring Suppl (ACCU-CHEK GUIDE ME) w/Device KIT Use to check blood sugar twice daily. Dx E11.65 08/06/20   Charlott Rakes, MD  chlorhexidine (HIBICLENS) 4 % external liquid Apply topically daily as needed. After clindamycin is completed 12/03/19   Gildardo Pounds, NP  clindamycin (CLEOCIN-T) 1 % external solution Apply topically 2 (two) times daily. Apply to underarms as directed 12/03/19   Gildardo Pounds, NP  fluticasone [facility]) 50 MCG/ACT nasal spray Place 1 spray into both nostrils daily. Patient not taking: Reported on 11/08/2020 10/20/20   Raspet, Erin K, PA-C  glucose blood (ACCU-CHEK GUIDE) test strip Use to  check blood sugar twice daily. Dx E11.65 08/06/20   Charlott Rakes, MD  insulin glargine (LANTUS SOLOSTAR) 100 UNIT/ML Solostar Pen Inject 20 Units into the skin daily. 11/08/20 02/06/21  Gildardo Pounds, NP  Insulin Pen Needle (B-D UF III MINI PEN NEEDLES) 31G X 5 MM MISC Use as instructed. Inject into the skin once daily 11/08/20   Gildardo Pounds, NP  liraglutide (VICTOZA) 18 MG/3ML SOPN Inject 1.8 mg into the skin daily. 11/08/20   Gildardo Pounds, NP  lisinopril (ZESTRIL) 5 MG tablet Take 1 tablet (5 mg total) by mouth daily. 07/12/19   Gildardo Pounds, NP  naproxen (NAPROSYN) 500 MG tablet Take 1 tablet (500 mg total) by mouth 2 (two) times daily as needed for moderate pain. 08/21/20   Petrucelli, Glynda Jaeger, PA-C  TRI-SPRINTEC 0.18/0.215/0.25 MG-35 MCG tablet Take 1 tablet by mouth daily 08/08/20   Gildardo Pounds, NP    Family History Family History  Problem Relation Age of Onset   Diabetes Maternal Grandmother    Stomach cancer Maternal Grandmother     Social History Social History   Tobacco Use   Smoking status: Never   Smokeless tobacco: Never  Substance Use Topics   Alcohol use: Not Currently   Drug use: Not Currently     Allergies   Patient has no known allergies.   Review of Systems Review of Systems Per HPI  Physical Exam Triage Vital Signs ED Triage Vitals  Enc Vitals Group     BP 11/26/20 1022 (!) 147/96     Pulse Rate 11/26/20 1022 75     Resp 11/26/20 1022 14     Temp 11/26/20 1022 98.4 F (36.9 C)     Temp Source 11/26/20 1022 Oral     SpO2 11/26/20 1022 98 %     Weight --      Height --      Head Circumference --      Peak Flow --      Pain Score 11/26/20 1039 0     Pain Loc --      Pain Edu? --      Excl. in [place]? --    No data found.  Updated Vital Signs BP (!) 147/96 (BP Location: Right Arm)   Pulse 75   Temp 98.4 F (36.9 C) (Oral)   Resp 14   SpO2 98%   Visual Acuity Right Eye Distance: 20/50 Left Eye Distance: 20/70 Bilateral  Distance: 20/30  Right Eye Near:   Left Eye Near:    Bilateral Near:     Physical Exam Constitutional:      General: She is not in acute distress.    Appearance: Normal appearance. She is not toxic-appearing or diaphoretic.  HENT:     Head: Normocephalic and atraumatic.  Eyes:     General: Lids are everted, no foreign bodies appreciated. Vision grossly intact. Gaze aligned appropriately.        Right eye: No foreign body, discharge or hordeolum.        Left eye: No foreign body, discharge or hordeolum.     Extraocular Movements: Extraocular movements intact.     Conjunctiva/sclera: Conjunctivae normal.     Pupils: Pupils are equal, round, and reactive to light.     Comments: Mild erythema and swelling noted to right upper eyelid.  Pulmonary:     Effort: Pulmonary effort is normal.  Neurological:     General: No focal deficit present.     Mental Status: She is alert and oriented to person, place, and time. Mental status is at baseline.  Psychiatric:        Mood and Affect: Mood normal.        Behavior: Behavior normal.        Thought Content: Thought content normal.        Judgment: Judgment normal.     UC Treatments / Results  Labs (all labs ordered are listed, but only abnormal results are displayed) Labs Reviewed - No data to display  EKG   Radiology No results found.  Procedures Procedures (including critical care time)  Medications Ordered in UC Medications - No data to display  Initial Impression / Assessment and Plan / UC Course  I have reviewed the triage vital signs and the nursing notes.  Pertinent labs & imaging results that were available during my care of the patient were reviewed by me and considered in my medical decision making (see chart for details).     Physical exam is consistent with preseptal cellulitis.  Will treat with cefdinir x10 days.  Visual acuity is fairly normal on exam.  Do not think that abnormal visual acuity is related to  infection as unaffected eye is worse than the affected.  It appears the patient may need eyeglasses.  Advised patient to follow-up with optometrist for further evaluation and management.  No red flags seen on exam.Discussed  strict return precautions. Patient verbalized understanding and is agreeable with plan.  Final Clinical Impressions(s) / UC Diagnoses   Final diagnoses:  Preseptal cellulitis of right eye     Discharge Instructions      You have preseptal cellulitis which is an infection of your eyelid.  You have been prescribed cefdinir antibiotic to treat this.  Please take this with food.  Follow-up if symptoms persist or do not resolve.     ED Prescriptions     Medication Sig Dispense Auth. Provider   cefdinir (OMNICEF) 300 MG capsule Take 1 capsule (300 mg total) by mouth 2 (two) times daily for 10 days. 20 capsule Teodora Medici, [place]      PDMP not reviewed this encounter.   Teodora Medici, [place] 11/26/20 [id number]

## 2020-11-26 NOTE — ED Triage Notes (Signed)
Pt c/o right eyelid erythema and pain. Onset 2 days ago. Denies eye pain, itchy states only eye lid.

## 2020-11-26 NOTE — Discharge Instructions (Addendum)
You have preseptal cellulitis which is an infection of your eyelid.  You have been prescribed cefdinir antibiotic to treat this.  Please take this with food.  Follow-up if symptoms persist or do not resolve.

## 2020-11-28 ENCOUNTER — Other Ambulatory Visit: Payer: Self-pay | Admitting: Nurse Practitioner

## 2020-11-28 DIAGNOSIS — N939 Abnormal uterine and vaginal bleeding, unspecified: Secondary | ICD-10-CM

## 2020-11-29 ENCOUNTER — Telehealth: Payer: Self-pay | Admitting: *Deleted

## 2020-11-29 NOTE — Telephone Encounter (Signed)
Called patient to schedule f/u B/P check . No answer, left message on voicemail [phone]. To call clinic back to schedule appt. Last OV requesting f/u B/P check and for further refills of tri-sprintec.

## 2021-01-06 ENCOUNTER — Ambulatory Visit: Payer: Self-pay | Admitting: *Deleted

## 2021-01-06 NOTE — Telephone Encounter (Signed)
Reason for Disposition  Taking a deep breath makes pain worse  Answer Assessment - Initial Assessment Questions 1. LOCATION: "Where does it hurt?"       Center of chest  2. RADIATION: "Does the pain go anywhere else?" (e.g., into neck, jaw, arms, back)     na 3. ONSET: "When did the chest pain begin?" (Minutes, hours or days)      3 days ago  4. PATTERN "Does the pain come and go, or has it been constant since it started?"  "Does it get worse with exertion?"      Constant  5. DURATION: "How long does it last" (e.g., seconds, minutes, hours)     All of the time if laying down  6. SEVERITY: "How bad is the pain?"  (e.g., Scale 1-10; mild, moderate, or severe)    - MILD (1-3): doesn't interfere with normal activities     - MODERATE (4-7): interferes with normal activities or awakens from sleep    - SEVERE (8-10): excruciating pain, unable to do any normal activities       Uncomfortable wearing a bra pain when breathing in  7. CARDIAC RISK FACTORS: "Do you have any history of heart problems or risk factors for heart disease?" (e.g., angina, prior heart attack; diabetes, high blood pressure, high cholesterol, smoker, or strong family history of heart disease)     na 8. PULMONARY RISK FACTORS: "Do you have any history of lung disease?"  (e.g., blood clots in lung, asthma, emphysema, birth control pills)     na 9. CAUSE: "What do you think is causing the chest pain?"     "Bump" located lower left chest area 10. OTHER SYMPTOMS: "Do you have any other symptoms?" (e.g., dizziness, nausea, vomiting, sweating, fever, difficulty breathing, cough)       na 11. PREGNANCY: "Is there any chance you are pregnant?" "When was your last menstrual period?"       Regular menstrual cycle  Protocols used: Chest Pain-A-AH

## 2021-01-06 NOTE — Telephone Encounter (Signed)
C/o chest pain x 3 days . Constant pain to lower left chest area "bump" reported and causes pain. C/o pain when wearing a bra. Pain in same area happened years ago and stopped now is back. C/o taking breath in is painful. Denies difficulty breathing , sweating , dizziness . Recommended ED for evaluation due to pain taking deep breath in. Care advise given. Patient verbalized understanding of care advise and to go to ED .

## 2021-01-11 NOTE — Telephone Encounter (Signed)
Tried to reach out to patient but number on file not in service. If patient returns call offer mobile location or soonest appt.

## 2021-02-19 ENCOUNTER — Other Ambulatory Visit: Payer: Self-pay | Admitting: Nurse Practitioner

## 2021-02-19 DIAGNOSIS — N939 Abnormal uterine and vaginal bleeding, unspecified: Secondary | ICD-10-CM

## 2021-02-19 NOTE — Telephone Encounter (Signed)
Requested Prescriptions  Pending Prescriptions Disp Refills   TRI-SPRINTEC 0.18/0.215/0.25 MG-35 MCG tablet [Pharmacy Med Name: Tri-Sprintec 0.18/0.215/0.25 MG-35 MCG Oral Tablet] 84 tablet 0    Sig: Take 1 tablet by mouth once daily     OB/GYN:  Contraceptives Failed - 02/19/2021  2:08 PM      Failed - Last BP in normal range    BP Readings from Last 1 Encounters:  11/26/20 (!) 147/96         Passed - Valid encounter within last 12 months    Recent Outpatient Visits          3 months ago Type 2 diabetes mellitus with hyperglycemia, with long-term current use of insulin (HCC)   [facility] [place], [place] W, NP   6 months ago Type 2 diabetes mellitus with hyperglycemia, with long-term current use of insulin [facility])   [place] [facility] [place], Shea Stakes, NP   1 year ago Encounter for annual physical exam   [facility] [place], [place] W, NP   1 year ago Type 2 diabetes mellitus with hyperglycemia, with long-term current use of insulin [facility])   [place] [facility] [place], [place] W, NP   1 year ago Type 2 diabetes mellitus with hyperglycemia, with long-term current use of insulin [facility])   [facility] [place], Cornelius Moras, RPH-CPP

## 2021-04-25 ENCOUNTER — Ambulatory Visit: Payer: Self-pay

## 2021-04-25 NOTE — Telephone Encounter (Signed)
Pt called in wanting a same day appt due to not feeling well, pt needed advice on what to do since next opening was in April.  ? ? ?Chief Complaint: Sore throat ?Symptoms: Runny nose, fatigue ?Frequency: Started Friday ?Pertinent Negatives: Patient denies fever ?Disposition: [] ED /[] Urgent Care (no appt availability in office) / [] Appointment(In office/virtual)/ []  [place] [facility]/ [] Home Care/ [] Refused Recommended Disposition /[]  Mobile Bus/ []  Follow-up with PCP ?Additional Notes: Asking to be worked in today. Please advise.  ?Answer Assessment - Initial Assessment Questions ?1. ONSET: "When did the throat start hurting?" (Hours or days ago)  ?    Friday ?2. SEVERITY: "How bad is the sore throat?" (Scale 1-10; mild, moderate or severe) ?  - MILD (1-3):  doesn't interfere with eating or normal activities ?  - MODERATE (4-7): interferes with eating some solids and normal activities ?  - SEVERE (8-10):  excruciating pain, interferes with most normal activities ?  - SEVERE DYSPHAGIA: can't swallow liquids, drooling ?    Severe ?3. STREP EXPOSURE: "Has there been any exposure to strep within the past week?" If Yes, ask: "What type of contact occurred?"  ?    No ?4.  VIRAL SYMPTOMS: "Are there any symptoms of a cold, such as a runny nose, cough, hoarse voice or red eyes?"  ?    Runny nose,fatigue ?5. FEVER: "Do you have a fever?" If Yes, ask: "What is your temperature, how was it measured, and when did it start?" ?    No ?6. PUS ON THE TONSILS: "Is there pus on the tonsils in the back of your throat?" ?    No ?7. OTHER SYMPTOMS: "Do you have any other symptoms?" (e.g., difficulty breathing, headache, rash) ?    Cough, congestion ?8. PREGNANCY: "Is there any chance you are pregnant?" "When was your last menstrual period?" ?    No ? ?Protocols used: Sore Throat-A-AH ? ?

## 2021-04-25 NOTE — Telephone Encounter (Signed)
Scheduled apt for 04/26/2021 at 1500 ?

## 2021-04-25 NOTE — Telephone Encounter (Signed)
Called pt unable to reach , number not in service. ?

## 2021-04-26 ENCOUNTER — Telehealth (INDEPENDENT_AMBULATORY_CARE_PROVIDER_SITE_OTHER): Payer: Medicaid Other | Admitting: Physician Assistant

## 2021-04-26 ENCOUNTER — Encounter: Payer: Self-pay | Admitting: Physician Assistant

## 2021-04-26 DIAGNOSIS — R112 Nausea with vomiting, unspecified: Secondary | ICD-10-CM | POA: Diagnosis not present

## 2021-04-26 DIAGNOSIS — J0100 Acute maxillary sinusitis, unspecified: Secondary | ICD-10-CM

## 2021-04-26 MED ORDER — FLUTICASONE PROPIONATE 50 MCG/ACT NA SUSP: 2.0000 | Freq: Every day | NASAL | 6 refills | Status: DC

## 2021-04-26 MED ORDER — AZITHROMYCIN 250 MG PO TABS: ORAL_TABLET | ORAL | 0 refills | Status: DC

## 2021-04-26 MED ORDER — ONDANSETRON 8 MG PO TBDP: 8.0000 mg | ORAL_TABLET | Freq: Three times a day (TID) | ORAL | 0 refills | Status: DC | PRN

## 2021-04-26 NOTE — Patient Instructions (Signed)
?Melissa Gilmore, thank you for joining Kennieth Rad, PA-C for today's virtual visit.  While this provider is not your primary care provider (PCP), if your PCP is located in our provider database this encounter information will be shared with them immediately following your visit. ? ?Consent: ?(Patient) Melissa Gilmore provided verbal consent for this virtual visit at the beginning of the encounter. ? ?Current Medications: ? ?Current Outpatient Medications:  ?  azithromycin (ZITHROMAX) 250 MG tablet, Take 2 tabs PO day 1, then take 1 tab PO once daily, Disp: 6 tablet, Rfl: 0 ?  fluticasone (FLONASE) 50 MCG/ACT nasal spray, Place 2 sprays into both nostrils daily., Disp: 16 g, Rfl: 6 ?  ondansetron (ZOFRAN-ODT) 8 MG disintegrating tablet, Take 1 tablet (8 mg total) by mouth every 8 (eight) hours as needed for nausea or vomiting., Disp: 20 tablet, Rfl: 0 ?  Accu-Chek Softclix Lancets lancets, Use to check blood sugar twice daily. Dx E11.65, Disp: 100 each, Rfl: 3 ?  Blood Glucose Monitoring Suppl (ACCU-CHEK GUIDE ME) w/Device KIT, Use to check blood sugar twice daily. Dx E11.65, Disp: 1 kit, Rfl: 0 ?  chlorhexidine (HIBICLENS) 4 % external liquid, Apply topically daily as needed. After clindamycin is completed, Disp: 240 mL, Rfl: 2 ?  clindamycin (CLEOCIN-T) 1 % external solution, Apply topically 2 (two) times daily. Apply to underarms as directed, Disp: 60 mL, Rfl: 0 ?  glucose blood (ACCU-CHEK GUIDE) test strip, Use to check blood sugar twice daily. Dx E11.65, Disp: 100 each, Rfl: 12 ?  insulin glargine (LANTUS SOLOSTAR) 100 UNIT/ML Solostar Pen, Inject 20 Units into the skin daily., Disp: 18 mL, Rfl: 1 ?  Insulin Pen Needle (B-D UF III MINI PEN NEEDLES) 31G X 5 MM MISC, Use as instructed. Inject into the skin once daily, Disp: 100 each, Rfl: 3 ?  liraglutide (VICTOZA) 18 MG/3ML SOPN, Inject 1.8 mg into the skin daily., Disp: 9 mL, Rfl: 2 ?  lisinopril (ZESTRIL) 5 MG tablet, Take 1 tablet (5 mg total) by mouth  daily., Disp: 90 tablet, Rfl: 3 ?  naproxen (NAPROSYN) 500 MG tablet, Take 1 tablet (500 mg total) by mouth 2 (two) times daily as needed for moderate pain., Disp: 15 tablet, Rfl: 0 ?  TRI-SPRINTEC 0.18/0.215/0.25 MG-35 MCG tablet, Take 1 tablet by mouth once daily, Disp: 84 tablet, Rfl: 0  ? ?Medications ordered in this encounter:  ?Meds ordered this encounter  ?Medications  ? azithromycin (ZITHROMAX) 250 MG tablet  ?  Sig: Take 2 tabs PO day 1, then take 1 tab PO once daily  ?  Dispense:  6 tablet  ?  Refill:  0  ?  Order Specific Question:   Supervising Provider  ?  Answer:   Elsie Stain [[id number]]  ? ondansetron (ZOFRAN-ODT) 8 MG disintegrating tablet  ?  Sig: Take 1 tablet (8 mg total) by mouth every 8 (eight) hours as needed for nausea or vomiting.  ?  Dispense:  20 tablet  ?  Refill:  0  ?  Order Specific Question:   Supervising Provider  ?  Answer:   Elsie Stain [[id number]]  ? fluticasone (FLONASE) 50 MCG/ACT nasal spray  ?  Sig: Place 2 sprays into both nostrils daily.  ?  Dispense:  16 g  ?  Refill:  6  ?  Order Specific Question:   Supervising Provider  ?  Answer:   Elsie Stain [[id number]]  ?  ? ?*If you need refills on other medications prior  to your next appointment, please contact your pharmacy* ? ?Follow-Up: ?Call back or seek an in-person evaluation if the symptoms worsen or if the condition fails to improve as anticipated. ? ?Other Instructions ?I hope that you feel better soon, please let us know if there is anything else we can do for you ? ? ?Sinusitis, Adult ?Sinusitis is inflammation of your sinuses. Sinuses are hollow spaces in the bones around your face. Your sinuses are located: ?Around your eyes. ?In the middle of your forehead. ?Behind your nose. ?In your cheekbones. ?Mucus normally drains out of your sinuses. When your nasal tissues become inflamed or swollen, mucus can become trapped or blocked. This allows bacteria, viruses, and fungi to grow, which leads to infection. Most  infections of the sinuses are caused by a virus. ?Sinusitis can develop quickly. It can last for up to 4 weeks (acute) or for more than 12 weeks (chronic). Sinusitis often develops after a cold. ?What are the causes? ?This condition is caused by anything that creates swelling in the sinuses or stops mucus from draining. This includes: ?Allergies. ?Asthma. ?Infection from bacteria or viruses. ?Deformities or blockages in your nose or sinuses. ?Abnormal growths in the nose (nasal polyps). ?Pollutants, such as chemicals or irritants in the air. ?Infection from fungi (rare). ?What increases the risk? ?You are more likely to develop this condition if you: ?Have a weak body defense system (immune system). ?Do a lot of swimming or diving. ?Overuse nasal sprays. ?Smoke. ?What are the signs or symptoms? ?The main symptoms of this condition are pain and a feeling of pressure around the affected sinuses. Other symptoms include: ?Stuffy nose or congestion. ?Thick drainage from your nose. ?Swelling and warmth over the affected sinuses. ?Headache. ?Upper toothache. ?A cough that may get worse at night. ?Extra mucus that collects in the throat or the back of the nose (postnasal drip). ?Decreased sense of smell and taste. ?Fatigue. ?A fever. ?Sore throat. ?Bad breath. ?How is this diagnosed? ?This condition is diagnosed based on: ?Your symptoms. ?Your medical history. ?A physical exam. ?Tests to find out if your condition is acute or chronic. This may include: ?Checking your nose for nasal polyps. ?Viewing your sinuses using a device that has a light (endoscope). ?Testing for allergies or bacteria. ?Imaging tests, such as an MRI or CT scan. ?In rare cases, a bone biopsy may be done to rule out more serious types of fungal sinus disease. ?How is this treated? ?Treatment for sinusitis depends on the cause and whether your condition is chronic or acute. ?If caused by a virus, your symptoms should go away on their own within 10 days.  You may be given medicines to relieve symptoms. They include: ?Medicines that shrink swollen nasal passages (topical intranasal decongestants). ?Medicines that treat allergies (antihistamines). ?A spray that eases inflammation of the nostrils (topical intranasal corticosteroids). ?Rinses that help get rid of thick mucus in your nose (nasal saline washes). ?If caused by bacteria, your health care provider may recommend waiting to see if your symptoms improve. Most bacterial infections will get better without antibiotic medicine. You may be given antibiotics if you have: ?A severe infection. ?A weak immune system. ?If caused by narrow nasal passages or nasal polyps, you may need to have surgery. ?Follow these instructions at home: ?Medicines ?Take, use, or apply over-the-counter and prescription medicines only as told by your health care provider. These may include nasal sprays. ?If you were prescribed an antibiotic medicine, take it as told by your health care  provider. Do not stop taking the antibiotic even if you start to feel better. ?Hydrate and humidify ? ?Drink enough fluid to keep your urine pale yellow. Staying hydrated will help to thin your mucus. ?Use a cool mist humidifier to keep the humidity level in your home above 50%. ?Inhale steam for 10-15 minutes, 3-4 times a day, or as told by your health care provider. You can do this in the bathroom while a hot shower is running. ?Limit your exposure to cool or dry air. ?Rest ?Rest as much as possible. ?Sleep with your head raised (elevated). ?Make sure you get enough sleep each night. ?General instructions ? ?Apply a warm, moist washcloth to your face 3-4 times a day or as told by your health care provider. This will help with discomfort. ?Wash your hands often with soap and water to reduce your exposure to germs. If soap and water are not available, use hand sanitizer. ?Do not smoke. Avoid being around people who are smoking (secondhand smoke). ?Keep all  follow-up visits as told by your health care provider. This is important. ?Contact a health care provider if: ?You have a fever. ?Your symptoms get worse. ?Your symptoms do not improve within 10 days. ?Get help right

## 2021-04-26 NOTE — Progress Notes (Signed)
?Virtual Visit Consent  ? ?Montey Hora, you are scheduled for a virtual visit with a [facility] provider today.   ?  ?Just as with appointments in the office, your consent must be obtained to participate.  Your consent will be active for this visit and any virtual visit you may have with one of our providers in the next 365 days.   ?  ?If you have a MyChart account, a copy of this consent can be sent to you electronically.  All virtual visits are billed to your insurance company just like a traditional visit in the office.   ? ?As this is a virtual visit, video technology does not allow for your provider to perform a traditional examination.  This may limit your provider's ability to fully assess your condition.  If your provider identifies any concerns that need to be evaluated in person or the need to arrange testing (such as labs, EKG, etc.), we will make arrangements to do so.   ?  ?Although advances in technology are sophisticated, we cannot ensure that it will always work on either your end or our end.  If the connection with a video visit is poor, the visit may have to be switched to a telephone visit.  With either a video or telephone visit, we are not always able to ensure that we have a secure connection.    ? ?I need to obtain your verbal consent now.   Are you willing to proceed with your visit today?  ?  ?Omie Ferger has provided verbal consent on 04/26/2021 for a virtual visit (video or telephone). ?  ?Kennieth Rad, PA-C  ? ?Date: 04/26/2021 2:20 PM ? ? ?Virtual Visit via Video Note  ? ?I, Melissa Gilmore, connected with  Marina Boerner  ([id number], 11-06-1995) on 04/26/21 at  3:00 PM EDT by a video-enabled telemedicine application and verified that I am speaking with the correct person using two identifiers. ? ?Location: ?Patient: Virtual Visit Location Patient: Home ?Provider: Virtual Visit Location Provider: Office/Clinic ?  ?I discussed the limitations of evaluation and management by  telemedicine and the availability of in person appointments. The patient expressed understanding and agreed to proceed.   ? ?History of Present Illness: ?Alethia Melendrez is a 26 y.o. who identifies as a female who was assigned female at birth, and is being seen today for states that she has been experiencing a sore throat, nasal congestion with yellow discharge, pain and pressure in her sinuses, body aches, headaches, dry cough, nausea and vomiting.  States that her symptoms started Friday 5 days ago.  States that her mother and sister both had similar symptoms, states they are both currently hospitalized. ? ?States that she has not been able to keep much down, is trying to stay hydrated. ? ?States that she has been using Robitussin and NyQuil, ibuprofen without relief. ? ?States that she has not taken a home COVID test, states she does not have access to one ? ?Review of Systems  ?Constitutional:  Negative for chills and fever.  ?HENT:  Positive for congestion, sinus pain and sore throat. Negative for ear discharge.   ?Eyes: Negative.   ?Respiratory:  Positive for cough. Negative for sputum production, shortness of breath and wheezing.   ?Cardiovascular:  Negative for chest pain.  ?Gastrointestinal:  Positive for nausea and vomiting. Negative for diarrhea.  ?Genitourinary: Negative.   ?Musculoskeletal:  Positive for myalgias.  ?Skin: Negative.   ?Neurological:  Positive for headaches.  ?Endo/Heme/Allergies: Negative.   ?  Psychiatric/Behavioral: Negative.    ? ?Problems: There are no problems to display for this patient. ?  ?Allergies: No Known Allergies ?Medications:  ?Current Outpatient Medications:  ?  azithromycin (ZITHROMAX) 250 MG tablet, Take 2 tabs PO day 1, then take 1 tab PO once daily, Disp: 6 tablet, Rfl: 0 ?  fluticasone (FLONASE) 50 MCG/ACT nasal spray, Place 2 sprays into both nostrils daily., Disp: 16 g, Rfl: 6 ?  ondansetron (ZOFRAN-ODT) 8 MG disintegrating tablet, Take 1 tablet (8 mg total) by mouth  every 8 (eight) hours as needed for nausea or vomiting., Disp: 20 tablet, Rfl: 0 ?  Accu-Chek Softclix Lancets lancets, Use to check blood sugar twice daily. Dx E11.65, Disp: 100 each, Rfl: 3 ?  Blood Glucose Monitoring Suppl (ACCU-CHEK GUIDE ME) w/Device KIT, Use to check blood sugar twice daily. Dx E11.65, Disp: 1 kit, Rfl: 0 ?  chlorhexidine (HIBICLENS) 4 % external liquid, Apply topically daily as needed. After clindamycin is completed, Disp: 240 mL, Rfl: 2 ?  clindamycin (CLEOCIN-T) 1 % external solution, Apply topically 2 (two) times daily. Apply to underarms as directed, Disp: 60 mL, Rfl: 0 ?  glucose blood (ACCU-CHEK GUIDE) test strip, Use to check blood sugar twice daily. Dx E11.65, Disp: 100 each, Rfl: 12 ?  insulin glargine (LANTUS SOLOSTAR) 100 UNIT/ML Solostar Pen, Inject 20 Units into the skin daily., Disp: 18 mL, Rfl: 1 ?  Insulin Pen Needle (B-D UF III MINI PEN NEEDLES) 31G X 5 MM MISC, Use as instructed. Inject into the skin once daily, Disp: 100 each, Rfl: 3 ?  liraglutide (VICTOZA) 18 MG/3ML SOPN, Inject 1.8 mg into the skin daily., Disp: 9 mL, Rfl: 2 ?  lisinopril (ZESTRIL) 5 MG tablet, Take 1 tablet (5 mg total) by mouth daily., Disp: 90 tablet, Rfl: 3 ?  naproxen (NAPROSYN) 500 MG tablet, Take 1 tablet (500 mg total) by mouth 2 (two) times daily as needed for moderate pain., Disp: 15 tablet, Rfl: 0 ?  TRI-SPRINTEC 0.18/0.215/0.25 MG-35 MCG tablet, Take 1 tablet by mouth once daily, Disp: 84 tablet, Rfl: 0 ? ?Observations/Objective: ?Patient is well-developed, well-nourished in no acute distress.  ?Resting comfortably  at home.  ?Head is normocephalic, atraumatic.  ?No labored breathing.  ?Speech is clear and coherent with logical content.  ?Patient is alert and oriented at baseline.  ? ? ?Assessment and Plan: ?1. Acute maxillary sinusitis, recurrence not specified ?- azithromycin (ZITHROMAX) 250 MG tablet; Take 2 tabs PO day 1, then take 1 tab PO once daily  Dispense: 6 tablet; Refill: 0 ?-  fluticasone (FLONASE) 50 MCG/ACT nasal spray; Place 2 sprays into both nostrils daily.  Dispense: 16 g; Refill: 6 ? ?2. Nausea and vomiting, unspecified vomiting type ?- ondansetron (ZOFRAN-ODT) 8 MG disintegrating tablet; Take 1 tablet (8 mg total) by mouth every 8 (eight) hours as needed for nausea or vomiting.  Dispense: 20 tablet; Refill: 0 ? ?1. Acute maxillary sinusitis, recurrence not specified ?Trial azithromycin, Flonase.  Patient education given on supportive care.  Red flags for prompt reevaluation ?- azithromycin (ZITHROMAX) 250 MG tablet; Take 2 tabs PO day 1, then take 1 tab PO once daily  Dispense: 6 tablet; Refill: 0 ?- fluticasone (FLONASE) 50 MCG/ACT nasal spray; Place 2 sprays into both nostrils daily.  Dispense: 16 g; Refill: 6 ? ?2. Nausea and vomiting, unspecified vomiting type ?Trial Zofran ?- ondansetron (ZOFRAN-ODT) 8 MG disintegrating tablet; Take 1 tablet (8 mg total) by mouth every 8 (eight) hours as needed for nausea  or vomiting.  Dispense: 20 tablet; Refill: 0 ? ? ?Follow Up Instructions: ?I discussed the assessment and treatment plan with the patient. The patient was provided an opportunity to ask questions and all were answered. The patient agreed with the plan and demonstrated an understanding of the instructions.  A copy of instructions were sent to the patient via MyChart unless otherwise noted below.  ? ? ? ?The patient was advised to call back or seek an in-person evaluation if the symptoms worsen or if the condition fails to improve as anticipated. ? ?Time:  ?I spent 12 minutes with the patient via telehealth technology discussing the above problems/concerns.   ? ?Gerrad Welker S Mayers, PA-C ? ?

## 2021-05-03 ENCOUNTER — Other Ambulatory Visit: Payer: Self-pay | Admitting: Nurse Practitioner

## 2021-05-03 DIAGNOSIS — N939 Abnormal uterine and vaginal bleeding, unspecified: Secondary | ICD-10-CM

## 2021-05-10 ENCOUNTER — Encounter: Payer: Self-pay | Admitting: Nurse Practitioner

## 2021-05-10 ENCOUNTER — Telehealth (HOSPITAL_BASED_OUTPATIENT_CLINIC_OR_DEPARTMENT_OTHER): Payer: Medicaid Other | Admitting: Nurse Practitioner

## 2021-05-10 DIAGNOSIS — N939 Abnormal uterine and vaginal bleeding, unspecified: Secondary | ICD-10-CM

## 2021-05-10 MED ORDER — NORGESTIM-ETH ESTRAD TRIPHASIC 0.18/0.215/0.25 MG-35 MCG PO TABS: 1.0000 | ORAL_TABLET | Freq: Every day | ORAL | 3 refills | Status: DC

## 2021-05-10 NOTE — Progress Notes (Signed)
Virtual Visit via Telephone Note ?Due to national recommendations of social distancing due to COVID 19, telehealth visit is felt to be most appropriate for this patient at this time.  I discussed the limitations, risks, security and privacy concerns of performing an evaluation and management service by telephone and the availability of in person appointments. I also discussed with the patient that there may be a patient responsible charge related to this service. The patient expressed understanding and agreed to proceed.  ? ? ?I connected with Melissa Gilmore on 05/10/21  at  10:50 AM EDT  EDT by telephone and verified that I am speaking with the correct person using two identifiers. ? ?Location of Patient: ?Private Residence ?  ?Location of Provider: ?[employer] and [employer] [facility]  ?  ?Persons participating in Telemedicine visit: ?Bertram Denver FNP-BC ?Sherrin Stahle  ?  ?History of Present Illness: ?Telemedicine visit for: refill birth control ?She has a past medical history of Depression, DM2, Dissociative identity disorder, and Schizophrenia.  ? ?She is requesting refill of her OCPs today. She endorses adherence with taking and has not missed any doses. She is overdue for her appointment to follow up to DM and HTN. I have offered to schedule her an appt today however she states she will call the office at a later time to schedule.  ? ? ? ?Past Medical History:  ?Diagnosis Date  ? Depression   ? Diabetes mellitus without complication (HCC)   ? Dissociative identity disorder [facility])   ? Schizophrenia (HCC)   ?  ?Past Surgical History:  ?Procedure Laterality Date  ? NO PAST SURGERIES    ?  ?Family History  ?Problem Relation Age of Onset  ? Diabetes Maternal Grandmother   ? Stomach cancer Maternal Grandmother   ?  ?Social History  ? ?Socioeconomic History  ? Marital status: Single  ?  Spouse name: Not on file  ? Number of children: Not on file  ? Years of education: Not on file  ? Highest education  level: Not on file  ?Occupational History  ? Not on file  ?Tobacco Use  ? Smoking status: Never  ? Smokeless tobacco: Never  ?Substance and Sexual Activity  ? Alcohol use: Not Currently  ? Drug use: Not Currently  ? Sexual activity: Not Currently  ?Other Topics Concern  ? Not on file  ?Social History Narrative  ? Not on file  ? ?Social Determinants of Health  ? ?Financial Resource Strain: Not on file  ?Food Insecurity: Not on file  ?Transportation Needs: Not on file  ?Physical Activity: Not on file  ?Stress: Not on file  ?Social Connections: Not on file  ?  ? ?Observations/Objective: ?Awake, alert and oriented x 3 ? ? ?Review of Systems  ?Constitutional:  Negative for fever, malaise/fatigue and weight loss.  ?HENT: Negative.  Negative for nosebleeds.   ?Eyes: Negative.  Negative for blurred vision, double vision and photophobia.  ?Respiratory: Negative.  Negative for cough and shortness of breath.   ?Cardiovascular: Negative.  Negative for chest pain, palpitations and leg swelling.  ?Gastrointestinal: Negative.  Negative for heartburn, nausea and vomiting.  ?Musculoskeletal: Negative.  Negative for myalgias.  ?Neurological: Negative.  Negative for dizziness, focal weakness, seizures and headaches.  ?Psychiatric/Behavioral: Negative.  Negative for suicidal ideas.    ?Assessment and Plan: ?Diagnoses and all orders for this visit: ? ?Abnormal uterine bleeding (AUB) ?-     Norgestimate-Ethinyl Estradiol Triphasic (TRI-SPRINTEC) 0.18/0.215/0.25 MG-35 MCG tablet; Take 1 tablet by mouth daily. ? ?  ? ?  Follow Up Instructions ?Return for DM/HTN.  ? ?  ?I discussed the assessment and treatment plan with the patient. The patient was provided an opportunity to ask questions and all were answered. The patient agreed with the plan and demonstrated an understanding of the instructions. ?  ?The patient was advised to call back or seek an in-person evaluation if the symptoms worsen or if the condition fails to improve as  anticipated. ? ?I provided 11 minutes of non-face-to-face time during this encounter including median intraservice time, reviewing previous notes, labs, imaging, medications and explaining diagnosis and management. ? ?Claiborne Rigg, FNP-BC  ?

## 2021-07-14 ENCOUNTER — Ambulatory Visit
Admission: RE | Admit: 2021-07-14 | Discharge: 2021-07-14 | Disposition: A | Discharge status: Home / Self Care | Payer: Medicaid Other | Source: Ambulatory Visit | Attending: Internal Medicine | Admitting: Internal Medicine

## 2021-07-14 ENCOUNTER — Encounter (HOSPITAL_COMMUNITY): Payer: Self-pay

## 2021-07-14 ENCOUNTER — Other Ambulatory Visit: Payer: Self-pay

## 2021-07-14 ENCOUNTER — Emergency Department (HOSPITAL_COMMUNITY)
Admission: EM | Admit: 2021-07-14 | Discharge: 2021-07-15 | Disposition: A | Discharge status: Home / Self Care | Payer: Medicaid Other | Attending: Emergency Medicine | Admitting: Emergency Medicine

## 2021-07-14 VITALS — BP 145/96 | HR 80 | Temp 98.6°F | Resp 18

## 2021-07-14 DIAGNOSIS — K8020 Calculus of gallbladder without cholecystitis without obstruction: Secondary | ICD-10-CM | POA: Diagnosis not present

## 2021-07-14 DIAGNOSIS — R8290 Unspecified abnormal findings in urine: Secondary | ICD-10-CM | POA: Diagnosis not present

## 2021-07-14 DIAGNOSIS — R0981 Nasal congestion: Secondary | ICD-10-CM | POA: Diagnosis not present

## 2021-07-14 DIAGNOSIS — R197 Diarrhea, unspecified: Secondary | ICD-10-CM | POA: Diagnosis not present

## 2021-07-14 DIAGNOSIS — Z20822 Contact with and (suspected) exposure to covid-19: Secondary | ICD-10-CM | POA: Diagnosis not present

## 2021-07-14 DIAGNOSIS — R112 Nausea with vomiting, unspecified: Secondary | ICD-10-CM

## 2021-07-14 DIAGNOSIS — R109 Unspecified abdominal pain: Secondary | ICD-10-CM | POA: Diagnosis not present

## 2021-07-14 LAB — CBC
HCT: 38.2 % (ref 36.0–46.0)
Hemoglobin: 13.1 g/dL (ref 12.0–15.0)
MCH: 28.9 pg (ref 26.0–34.0)
MCHC: 34.3 g/dL (ref 30.0–36.0)
MCV: 84.3 fL (ref 80.0–100.0)
Platelets: 261 10*3/uL (ref 150–400)
RBC: 4.53 MIL/uL (ref 3.87–5.11)
RDW: 12.7 % (ref 11.5–15.5)
WBC: 6.3 10*3/uL (ref 4.0–10.5)
nRBC: 0 % (ref 0.0–0.2)

## 2021-07-14 MED ORDER — CETIRIZINE HCL 10 MG PO TABS: 10.0000 mg | ORAL_TABLET | Freq: Every day | ORAL | 0 refills | Status: DC

## 2021-07-14 MED ORDER — FLUTICASONE PROPIONATE 50 MCG/ACT NA SUSP: 1.0000 | Freq: Every day | NASAL | 0 refills | Status: DC

## 2021-07-14 NOTE — ED Triage Notes (Signed)
Pt presents to ED from home with c/o abdominal pain with N/V x 2 weeks and nasal congestion with cough that began x 2-3 days ago. Pt seen at UC earlier today.

## 2021-07-14 NOTE — ED Provider Notes (Signed)
[facility]    CSN: [id number] Arrival date & time: 07/14/21  1555      History   Chief Complaint Chief Complaint  Patient presents with   Ear Injury    Ear infection - Entered by patient   Nausea   Abdominal Pain    HPI Melissa Gilmore is a 26 y.o. female.   Patient presents with several different chief complaints today.  Patient reports lower abdominal pain, nausea, vomiting, diarrhea that has been present for approximately 2 weeks.  Patient reports approximately 4 episodes of nonbloody emesis daily since symptoms started.  Patient had 1 episode of diarrhea that was nonbloody as well.  Patient is currently having normal bowel movements now.  Last normal bowel movement was yesterday.  Abdominal pain is located across the lower abdomen and patient reports that it is crampy in nature and is severe.  Patient currently rates the pain 8/10 on pain scale but reports that it has been 10/10 on pain scale.  Denies fever, body aches, chills.  Denies any recent travel outside the [place].  Patient reports that she took several pregnancy tests at home that were all negative.  Denies any urinary symptoms including burning, frequency, vaginal discharge, hematuria.  Patient also reporting that she has had some nasal congestion and some bilateral ear pain that has been present for approximately 2 days.  Denies any known sick contacts.  Denies fever, bodies, chills, cough, sore throat.  Patient has taken Tylenol for symptoms with minimal improvement.   Abdominal Pain   Past Medical History:  Diagnosis Date   Depression    Diabetes mellitus without complication ([place])    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[place])    Schizophrenia ([place])     There are no problems to display for this patient.   Past Surgical History:  Procedure Laterality Date   NO PAST SURGERIES      OB History   No obstetric history on file.      Home Medications    Prior to Admission medications    Medication Sig Start Date End Date Taking? Authorizing Provider  cetirizine (ZYRTEC) 10 MG tablet Take 1 tablet (10 mg total) by mouth daily. 07/14/21  Yes , Michele Rockers, FNP  fluticasone (FLONASE) 50 MCG/ACT nasal spray Place 1 spray into both nostrils daily for 3 days. 07/14/21 07/17/21 Yes , Michele Rockers, FNP  Accu-Chek Softclix Lancets lancets Use to check blood sugar twice daily. Dx E11.65 08/06/20   Charlott Rakes, MD  azithromycin (ZITHROMAX) 250 MG tablet Take 2 tabs PO day 1, then take 1 tab PO once daily Patient not taking: Reported on 07/14/2021 04/26/21   Mayers, Cari S, PA-C  Blood Glucose Monitoring Suppl (ACCU-CHEK GUIDE ME) w/Device KIT Use to check blood sugar twice daily. Dx E11.65 08/06/20   Charlott Rakes, MD  chlorhexidine (HIBICLENS) 4 % external liquid Apply topically daily as needed. After clindamycin is completed 12/03/19   Gildardo Pounds, NP  clindamycin (CLEOCIN-T) 1 % external solution Apply topically 2 (two) times daily. Apply to underarms as directed Patient not taking: Reported on 07/14/2021 12/03/19   Gildardo Pounds, NP  glucose blood (ACCU-CHEK GUIDE) test strip Use to check blood sugar twice daily. Dx E11.65 08/06/20   Charlott Rakes, MD  insulin glargine (LANTUS SOLOSTAR) 100 UNIT/ML Solostar Pen Inject 20 Units into the skin daily. 11/08/20 02/06/21  Gildardo Pounds, NP  Insulin Pen Needle (B-D UF III MINI PEN NEEDLES) 31G X 5 MM MISC Use  as instructed. Inject into the skin once daily 11/08/20   Gildardo Pounds, NP  liraglutide (VICTOZA) 18 MG/3ML SOPN Inject 1.8 mg into the skin daily. 11/08/20   Gildardo Pounds, NP  lisinopril (ZESTRIL) 5 MG tablet Take 1 tablet (5 mg total) by mouth daily. 07/12/19   Gildardo Pounds, NP  naproxen (NAPROSYN) 500 MG tablet Take 1 tablet (500 mg total) by mouth 2 (two) times daily as needed for moderate pain. 08/21/20   Petrucelli, Samantha R, PA-C  Norgestimate-Ethinyl Estradiol Triphasic (TRI-SPRINTEC) 0.18/0.215/0.25 MG-35 MCG tablet Take  1 tablet by mouth daily. 05/10/21   Gildardo Pounds, NP  ondansetron (ZOFRAN-ODT) 8 MG disintegrating tablet Take 1 tablet (8 mg total) by mouth every 8 (eight) hours as needed for nausea or vomiting. 04/26/21   Mayers, Loraine Grip, PA-C    Family History Family History  Problem Relation Age of Onset   Diabetes Maternal Grandmother    Stomach cancer Maternal Grandmother     Social History Social History   Tobacco Use   Smoking status: Never   Smokeless tobacco: Never  Substance Use Topics   Alcohol use: Not Currently   Drug use: Not Currently     Allergies   Patient has no known allergies.   Review of Systems Review of Systems Per HPI  Physical Exam Triage Vital Signs ED Triage Vitals  Enc Vitals Group     BP 07/14/21 1627 (!) 145/96     Pulse Rate 07/14/21 1631 80     Resp 07/14/21 1627 18     Temp 07/14/21 1627 98.6 F (37 C)     Temp src --      SpO2 07/14/21 1627 98 %     Weight --      Height --      Head Circumference --      Peak Flow --      Pain Score 07/14/21 1625 8     Pain Loc --      Pain Edu? --      Excl. in [place]? --    No data found.  Updated Vital Signs BP (!) 145/96 (BP Location: Right Arm)   Pulse 80   Temp 98.6 F (37 C)   Resp 18   LMP 06/21/2021 (Exact Date)   SpO2 98%   Visual Acuity Right Eye Distance:   Left Eye Distance:   Bilateral Distance:    Right Eye Near:   Left Eye Near:    Bilateral Near:     Physical Exam Constitutional:      General: She is not in acute distress.    Appearance: Normal appearance. She is not toxic-appearing or diaphoretic.  HENT:     Head: Normocephalic and atraumatic.     Right Ear: Ear canal normal. A middle ear effusion is present. Tympanic membrane is not perforated, erythematous or bulging.     Left Ear: Ear canal normal. A middle ear effusion is present. Tympanic membrane is not perforated, erythematous or bulging.     Nose: Congestion present.     Mouth/Throat:     Mouth: Mucous  membranes are moist.     Pharynx: No posterior oropharyngeal erythema.  Eyes:     Extraocular Movements: Extraocular movements intact.     Conjunctiva/sclera: Conjunctivae normal.     Pupils: Pupils are equal, round, and reactive to light.  Cardiovascular:     Rate and Rhythm: Normal rate and regular rhythm.     Pulses: Normal  pulses.     Heart sounds: Normal heart sounds.  Pulmonary:     Effort: Pulmonary effort is normal. No respiratory distress.     Breath sounds: Normal breath sounds. No stridor. No wheezing, rhonchi or rales.  Abdominal:     General: Abdomen is flat. Bowel sounds are normal.     Palpations: Abdomen is soft.     Tenderness: There is abdominal tenderness in the right upper quadrant, right lower quadrant, suprapubic area and left lower quadrant.  Musculoskeletal:        General: Normal range of motion.     Cervical back: Normal range of motion.  Skin:    General: Skin is warm and dry.  Neurological:     General: No focal deficit present.     Mental Status: She is alert and oriented to person, place, and time. Mental status is at baseline.  Psychiatric:        Mood and Affect: Mood normal.        Behavior: Behavior normal.      UC Treatments / Results  Labs (all labs ordered are listed, but only abnormal results are displayed) Labs Reviewed - No data to display  EKG   Radiology No results found.  Procedures Procedures (including critical care time)  Medications Ordered in UC Medications - No data to display  Initial Impression / Assessment and Plan / UC Course  I have reviewed the triage vital signs and the nursing notes.  Pertinent labs & imaging results that were available during my care of the patient were reviewed by me and considered in my medical decision making (see chart for details).     Patient's upper respiratory symptoms appear viral in nature versus allergic rhinitis.  Will treat with cetirizine and Flonase as these medications  should be safe given the patient reports that she only takes insulin daily.  Discussed supportive care and symptom manage with patient for upper respiratory symptoms.  Patient reported severe abdominal pain and patient's symptoms have also been present for 2 weeks.  I do think this warrants further evaluation and management at the hospital as she may need CT imaging of the abdomen.  Patient is very tender on palpation to physical exam.  Patient was advised that she need to go the hospital for further evaluation and management for abdominal symptoms given limited capabilities for evaluation here in urgent care. Do not think that upper respiratory symptoms are related. Patient was agreeable with plan.  Vital sign stable at discharge. Agree with patient self transfer to the hospital. Final Clinical Impressions(s) / UC Diagnoses   Final diagnoses:  Nasal congestion  Continuous severe abdominal pain  Nausea vomiting and diarrhea     Discharge Instructions      Please go to the emergency department for further evaluation and management of your severe abdominal pain, nausea, vomiting, diarrhea.  You have been prescribed 2 medications to alleviate your upper respiratory symptoms and ear discomfort.    ED Prescriptions     Medication Sig Dispense Auth. Provider   cetirizine (ZYRTEC) 10 MG tablet Take 1 tablet (10 mg total) by mouth daily. 30 tablet [place], [place] [address], [place]   fluticasone [facility]) 50 MCG/ACT nasal spray Place 1 spray into both nostrils daily for 3 days. 16 g Teodora Medici, [place]      PDMP not reviewed this encounter.   Teodora Medici, [place] 07/14/21 [id number]

## 2021-07-14 NOTE — Discharge Instructions (Signed)
Please go to the emergency department for further evaluation and management of your severe abdominal pain, nausea, vomiting, diarrhea.  You have been prescribed 2 medications to alleviate your upper respiratory symptoms and ear discomfort.

## 2021-07-14 NOTE — ED Triage Notes (Addendum)
Patient presents to Urgent Care with complaints of abdominal pain, nausea and vomiting and diarrhea for two weeks, pt reports diarrhea is intermittent, normal bm yesterday. Pt also repost new onset sinus pressure and otalgia since 2 days ago. Patient reports tylenol for pain, recent trip swimming.  Pt reports neg pregnancy test at home and 8.10- cramping lower abdominal pain

## 2021-07-14 NOTE — ED Notes (Signed)
Patient is being discharged from the Urgent Care and sent to the Emergency Department via pov . Per mound np, patient is in need of higher level of care due to severe abd pain. Patient is aware and verbalizes understanding of plan of care.  Vitals:   07/14/21 1627 07/14/21 1631  BP: (!) 145/96   Pulse:  80  Resp: 18   Temp: 98.6 F (37 C)   SpO2: 98%

## 2021-07-15 ENCOUNTER — Emergency Department (HOSPITAL_COMMUNITY): Payer: Medicaid Other

## 2021-07-15 LAB — URINALYSIS, ROUTINE W REFLEX MICROSCOPIC
Bilirubin Urine: NEGATIVE
Glucose, UA: >=500 mg/dL — AB
Hgb urine dipstick: NEGATIVE
Ketones, ur: 5 mg/dL — AB
Nitrite: NEGATIVE
Protein, ur: 30 mg/dL — AB
Specific Gravity, Urine: 1.024 (ref 1.005–1.030)
pH: 7 (ref 5.0–8.0)

## 2021-07-15 LAB — COMPREHENSIVE METABOLIC PANEL
ALT: 25 U/L (ref 0–44)
AST: 33 U/L (ref 15–41)
Albumin: 3.5 g/dL (ref 3.5–5.0)
Alkaline Phosphatase: 62 U/L (ref 38–126)
Anion gap: 7 (ref 5–15)
BUN: 8 mg/dL (ref 6–20)
CO2: 26 mmol/L (ref 22–32)
Calcium: 8.9 mg/dL (ref 8.9–10.3)
Chloride: 105 mmol/L (ref 98–111)
Creatinine, Ser: 0.61 mg/dL (ref 0.44–1.00)
GFR, Estimated: >60 mL/min (ref >60)
Glucose, Bld: 236 mg/dL — ABNORMAL HIGH (ref 70–99)
Potassium: 3.5 mmol/L (ref 3.5–5.1)
Sodium: 138 mmol/L (ref 135–145)
Total Bilirubin: 0.2 mg/dL — ABNORMAL LOW (ref 0.3–1.2)
Total Protein: 7.1 g/dL (ref 6.5–8.1)

## 2021-07-15 LAB — LIPASE, BLOOD: Lipase: 39 U/L (ref 11–51)

## 2021-07-15 LAB — RESP PANEL BY RT-PCR (FLU A&B, COVID) ARPGX2
Influenza A by PCR: NEGATIVE
Influenza B by PCR: NEGATIVE
SARS Coronavirus 2 by RT PCR: NEGATIVE

## 2021-07-15 LAB — I-STAT BETA HCG BLOOD, ED (MC, WL, AP ONLY): I-stat hCG, quantitative: <5 m[IU]/mL (ref <5)

## 2021-07-15 MED ORDER — ONDANSETRON 4 MG PO TBDP: 4.0000 mg | ORAL_TABLET | Freq: Three times a day (TID) | ORAL | 0 refills | Status: DC | PRN

## 2021-07-15 MED ORDER — HYDROMORPHONE HCL 1 MG/ML IJ SOLN
0.5000 mg | Freq: Once | INTRAMUSCULAR | Status: AC
  Administered 2021-07-15: 0.5 mg via INTRAVENOUS
  Filled 2021-07-15: qty 1

## 2021-07-15 MED ORDER — ONDANSETRON HCL 4 MG/2ML IJ SOLN
4.0000 mg | Freq: Once | INTRAMUSCULAR | Status: AC
  Administered 2021-07-15: 4 mg via INTRAVENOUS
  Filled 2021-07-15: qty 2

## 2021-07-15 MED ORDER — HYDROCODONE-ACETAMINOPHEN 5-325 MG PO TABS: 1.0000 | ORAL_TABLET | Freq: Four times a day (QID) | ORAL | 0 refills | Status: DC | PRN

## 2021-07-15 MED ORDER — CEPHALEXIN 500 MG PO CAPS
500.0000 mg | ORAL_CAPSULE | Freq: Once | ORAL | Status: AC
  Administered 2021-07-15: 500 mg via ORAL
  Filled 2021-07-15: qty 1

## 2021-07-15 MED ORDER — CEPHALEXIN 500 MG PO CAPS: 500.0000 mg | ORAL_CAPSULE | Freq: Two times a day (BID) | ORAL | 0 refills | Status: DC

## 2021-07-15 NOTE — ED Notes (Signed)
US at bedside

## 2021-07-15 NOTE — ED Provider Notes (Signed)
[facility] Emergency Department Provider Note MRN:  [id number]  Arrival date & time: 07/15/21     Chief Complaint   Abdominal Pain   History of Present Illness   Melissa Gilmore is a 26 y.o. year-old female presents to the ED with chief complaint of abdominal pain.  She reports associated nausea and vomiting.  States she has been having symptoms for the past 2 weeks, but symptoms have been gradually worsening.  She also reports some dysuria.  Denies fevers or chills.  Was seen earlier at urgent care and sent to the ED.  Denies prior abdominal surgeries.  Denies pregnancy or breast-feeding.  History provided by patient.   Review of Systems  Pertinent review of systems noted in HPI.    Physical Exam   Vitals:   07/15/21 0145 07/15/21 0219  BP: (!) 148/99   Pulse: 65   Resp: 17   Temp:  98.5 F (36.9 C)  SpO2: 100%     CONSTITUTIONAL: Nontoxic-appearing, NAD NEURO:  Alert and oriented x 3, CN 3-12 grossly intact EYES:  eyes equal and reactive ENT/NECK:  Supple, no stridor  CARDIO:  normal rate, regular rhythm, appears well-perfused  PULM:  No respiratory distress, CTAB GI/GU:  non-distended, RUQ TTP MSK/SPINE:  No gross deformities, no edema, moves all extremities  SKIN:  no rash, atraumatic   *Additional and/or pertinent findings included in MDM below  Diagnostic and Interventional Summary    EKG Interpretation  Date/Time:    Ventricular Rate:    PR Interval:    QRS Duration:   QT Interval:    QTC Calculation:   R Axis:     Text Interpretation:         Labs Reviewed  COMPREHENSIVE METABOLIC PANEL - Abnormal; Notable for the following components:      Result Value   Glucose, Bld 236 (*)    Total Bilirubin 0.2 (*)    All other components within normal limits  URINALYSIS, ROUTINE W REFLEX MICROSCOPIC - Abnormal; Notable for the following components:   APPearance HAZY (*)    Glucose, UA >=500 (*)    Ketones, ur 5 (*)     Protein, ur 30 (*)    Leukocytes,Ua MODERATE (*)    Bacteria, UA RARE (*)    All other components within normal limits  RESP PANEL BY RT-PCR (FLU A&B, COVID) ARPGX2  LIPASE, BLOOD  CBC  I-STAT BETA HCG BLOOD, ED ([facility], [facility], [facility] ONLY)    US Abdomen Limited  Final Result      Medications  ondansetron (ZOFRAN) injection 4 mg (4 mg Intravenous Given 07/15/21 0017)  HYDROmorphone (DILAUDID) injection 0.5 mg (0.5 mg Intravenous Given 07/15/21 0017)  cephALEXin (KEFLEX) capsule 500 mg (500 mg Oral Given 07/15/21 0216)     Procedures  /  Critical Care Procedures  ED Course and Medical Decision Making  I have reviewed the triage vital signs, the nursing notes, and pertinent available records from the EMR.  Social Determinants Affecting Complexity of Care: Patient has no clinically significant social determinants affecting this chief complaint..   ED Course:   Patient here with abdominal pain and nausea and vomiting.  Top differential diagnoses include gallbladder, UTI, KS, appy. Medical Decision Making Patient here with right upper abdominal pain that has been intermittent for the past 2 weeks.  Concern for gallbladder disease.  Right upper quadrant ultrasound shows gallstones, but no sign of acute cholecystitis.  She is afebrile and has reassuring labs.  Pain is well controlled.  We will plan for outpatient general surgery follow-up.  We will also cover with Keflex for abnormal urinalysis.  Problems Addressed: Abnormal urinalysis: acute illness or injury Calculus of gallbladder without cholecystitis without obstruction: acute illness or injury  Amount and/or Complexity of Data Reviewed Labs: ordered.    Details: No leukocytosis, normal LFTs, pregnancy test negative.  Urinalysis is abnormal, based on patient's urinary frequency/dysuria, will cover with Keflex. Radiology: ordered and independent interpretation performed.    Details: Right upper quadrant ultrasound notable for  cholelithiasis, but without signs of cholecystitis  Risk Prescription drug management. Parenteral controlled substances.     Consultants: No consultations were needed in caring for this patient.   Treatment and Plan: Emergency department workup does not suggest an emergent condition requiring admission or immediate intervention beyond  what has been performed at this time. The patient is safe for discharge and has  been instructed to return immediately for worsening symptoms, change in  symptoms or any other concerns    Final Clinical Impressions(s) / ED Diagnoses     ICD-10-CM   1. Calculus of gallbladder without cholecystitis without obstruction  K80.20     2. Abnormal urinalysis  R82.90       ED Discharge Orders          Ordered    HYDROcodone-acetaminophen (NORCO/VICODIN) 5-325 MG tablet  Every 6 hours PRN        07/15/21 0212    ondansetron (ZOFRAN-ODT) 4 MG disintegrating tablet  Every 8 hours PRN        07/15/21 0212    cephALEXin (KEFLEX) 500 MG capsule  2 times daily        07/15/21 0212              Discharge Instructions Discussed with and Provided to Patient:     Discharge Instructions      You need to follow-up with the general surgery office.    Return to the ER if your symptoms change or worsen.       Roxy Horseman, PA-C 07/15/21 [id number]    Nicanor Alcon, April, MD 07/15/21 2841

## 2021-07-15 NOTE — Discharge Instructions (Addendum)
You need to follow-up with the general surgery office.    Return to the ER if your symptoms change or worsen.

## 2021-07-15 NOTE — ED Notes (Signed)
Pt ambulated to restroom independently.

## 2021-07-16 ENCOUNTER — Encounter (HOSPITAL_COMMUNITY): Payer: Self-pay | Admitting: Anesthesiology

## 2021-07-16 ENCOUNTER — Emergency Department (HOSPITAL_COMMUNITY): Payer: Medicaid Other

## 2021-07-16 ENCOUNTER — Encounter (HOSPITAL_COMMUNITY): Payer: Self-pay | Admitting: Emergency Medicine

## 2021-07-16 ENCOUNTER — Observation Stay (HOSPITAL_COMMUNITY)
Admission: EM | Admit: 2021-07-16 | Discharge: 2021-07-18 | Disposition: A | Discharge status: Home / Self Care | Payer: Medicaid Other | Attending: Surgery | Admitting: Surgery

## 2021-07-16 ENCOUNTER — Other Ambulatory Visit: Payer: Self-pay

## 2021-07-16 DIAGNOSIS — E119 Type 2 diabetes mellitus without complications: Secondary | ICD-10-CM | POA: Diagnosis not present

## 2021-07-16 DIAGNOSIS — F209 Schizophrenia, unspecified: Secondary | ICD-10-CM

## 2021-07-16 DIAGNOSIS — K8012 Calculus of gallbladder with acute and chronic cholecystitis without obstruction: Secondary | ICD-10-CM | POA: Diagnosis not present

## 2021-07-16 DIAGNOSIS — F4481 Dissociative identity disorder: Secondary | ICD-10-CM

## 2021-07-16 DIAGNOSIS — K8020 Calculus of gallbladder without cholecystitis without obstruction: Secondary | ICD-10-CM

## 2021-07-16 DIAGNOSIS — K812 Acute cholecystitis with chronic cholecystitis: Secondary | ICD-10-CM | POA: Diagnosis present

## 2021-07-16 DIAGNOSIS — K760 Fatty (change of) liver, not elsewhere classified: Secondary | ICD-10-CM | POA: Diagnosis not present

## 2021-07-16 DIAGNOSIS — E785 Hyperlipidemia, unspecified: Secondary | ICD-10-CM

## 2021-07-16 DIAGNOSIS — Z794 Long term (current) use of insulin: Secondary | ICD-10-CM | POA: Diagnosis not present

## 2021-07-16 DIAGNOSIS — Z8742 Personal history of other diseases of the female genital tract: Secondary | ICD-10-CM

## 2021-07-16 DIAGNOSIS — R1010 Upper abdominal pain, unspecified: Secondary | ICD-10-CM | POA: Diagnosis present

## 2021-07-16 DIAGNOSIS — Z6835 Body mass index (BMI) 35.0-35.9, adult: Secondary | ICD-10-CM

## 2021-07-16 DIAGNOSIS — F32A Depression, unspecified: Secondary | ICD-10-CM

## 2021-07-16 HISTORY — DX: Acute cholecystitis with chronic cholecystitis: K81.2

## 2021-07-16 LAB — COMPREHENSIVE METABOLIC PANEL
ALT: 21 U/L (ref 0–44)
AST: 26 U/L (ref 15–41)
Albumin: 3.4 g/dL — ABNORMAL LOW (ref 3.5–5.0)
Alkaline Phosphatase: 81 U/L (ref 38–126)
Anion gap: 7 (ref 5–15)
BUN: 6 mg/dL (ref 6–20)
CO2: 22 mmol/L (ref 22–32)
Calcium: 8.7 mg/dL — ABNORMAL LOW (ref 8.9–10.3)
Chloride: 105 mmol/L (ref 98–111)
Creatinine, Ser: 0.68 mg/dL (ref 0.44–1.00)
GFR, Estimated: >60 mL/min (ref >60)
Glucose, Bld: 256 mg/dL — ABNORMAL HIGH (ref 70–99)
Potassium: 3.8 mmol/L (ref 3.5–5.1)
Sodium: 134 mmol/L — ABNORMAL LOW (ref 135–145)
Total Bilirubin: 0.3 mg/dL (ref 0.3–1.2)
Total Protein: 6.7 g/dL (ref 6.5–8.1)

## 2021-07-16 LAB — URINALYSIS, ROUTINE W REFLEX MICROSCOPIC
Bilirubin Urine: NEGATIVE
Glucose, UA: >=500 mg/dL — AB
Hgb urine dipstick: NEGATIVE
Ketones, ur: NEGATIVE mg/dL
Leukocytes,Ua: NEGATIVE
Nitrite: NEGATIVE
Protein, ur: NEGATIVE mg/dL
Specific Gravity, Urine: 1.022 (ref 1.005–1.030)
pH: 8 (ref 5.0–8.0)

## 2021-07-16 LAB — CBG MONITORING, ED
Glucose-Capillary: 252 mg/dL — ABNORMAL HIGH (ref 70–99)
Glucose-Capillary: 118 mg/dL — ABNORMAL HIGH (ref 70–99)

## 2021-07-16 LAB — CBC
HCT: 39 % (ref 36.0–46.0)
Hemoglobin: 13.5 g/dL (ref 12.0–15.0)
MCH: 29.3 pg (ref 26.0–34.0)
MCHC: 34.6 g/dL (ref 30.0–36.0)
MCV: 84.8 fL (ref 80.0–100.0)
Platelets: 263 10*3/uL (ref 150–400)
RBC: 4.6 MIL/uL (ref 3.87–5.11)
RDW: 12.5 % (ref 11.5–15.5)
WBC: 7.5 10*3/uL (ref 4.0–10.5)
nRBC: 0 % (ref 0.0–0.2)

## 2021-07-16 LAB — GLUCOSE, CAPILLARY
Glucose-Capillary: 141 mg/dL — ABNORMAL HIGH (ref 70–99)
Glucose-Capillary: 160 mg/dL — ABNORMAL HIGH (ref 70–99)

## 2021-07-16 LAB — I-STAT BETA HCG BLOOD, ED (MC, WL, AP ONLY): I-stat hCG, quantitative: <5 m[IU]/mL (ref <5)

## 2021-07-16 LAB — LIPASE, BLOOD: Lipase: 128 U/L — ABNORMAL HIGH (ref 11–51)

## 2021-07-16 MED ORDER — FAMOTIDINE IN NACL 20-0.9 MG/50ML-% IV SOLN
20.0000 mg | Freq: Once | INTRAVENOUS | Status: AC
  Administered 2021-07-16: 20 mg via INTRAVENOUS
  Filled 2021-07-16: qty 50

## 2021-07-16 MED ORDER — POTASSIUM CHLORIDE IN NACL 20-0.9 MEQ/L-% IV SOLN
INTRAVENOUS | Status: DC
Start: 2021-07-16 — End: 2021-07-18
  Filled 2021-07-16 (×3): qty 1000

## 2021-07-16 MED ORDER — ONDANSETRON 4 MG PO TBDP: 4.0000 mg | ORAL_TABLET | Freq: Four times a day (QID) | ORAL | Status: DC | PRN

## 2021-07-16 MED ORDER — ENOXAPARIN SODIUM 60 MG/0.6ML IJ SOSY
50.0000 mg | PREFILLED_SYRINGE | INTRAMUSCULAR | Status: DC
  Administered 2021-07-16 – 2021-07-17 (×2): 50 mg via SUBCUTANEOUS
  Filled 2021-07-16 (×2): qty 0.6
  Filled 2021-07-16: qty 0.5

## 2021-07-16 MED ORDER — IOHEXOL 300 MG/ML  SOLN
100.0000 mL | Freq: Once | INTRAMUSCULAR | Status: AC | PRN
  Administered 2021-07-16: 100 mL via INTRAVENOUS

## 2021-07-16 MED ORDER — HYDROMORPHONE HCL 1 MG/ML IJ SOLN
0.5000 mg | Freq: Once | INTRAMUSCULAR | Status: AC
  Administered 2021-07-16: 0.5 mg via INTRAVENOUS
  Filled 2021-07-16: qty 1

## 2021-07-16 MED ORDER — DOCUSATE SODIUM 100 MG PO CAPS
100.0000 mg | ORAL_CAPSULE | Freq: Two times a day (BID) | ORAL | Status: DC
  Administered 2021-07-16 – 2021-07-18 (×3): 100 mg via ORAL
  Filled 2021-07-16 (×3): qty 1

## 2021-07-16 MED ORDER — DIPHENHYDRAMINE HCL 12.5 MG/5ML PO ELIX: 12.5000 mg | ORAL_SOLUTION | Freq: Four times a day (QID) | ORAL | Status: DC | PRN

## 2021-07-16 MED ORDER — ACETAMINOPHEN 325 MG PO TABS
650.0000 mg | ORAL_TABLET | Freq: Four times a day (QID) | ORAL | Status: DC | PRN
  Administered 2021-07-16: 650 mg via ORAL
  Filled 2021-07-16: qty 2

## 2021-07-16 MED ORDER — ONDANSETRON 4 MG PO TBDP
4.0000 mg | ORAL_TABLET | Freq: Once | ORAL | Status: AC | PRN
  Administered 2021-07-16: 4 mg via ORAL
  Filled 2021-07-16: qty 1

## 2021-07-16 MED ORDER — ACETAMINOPHEN 650 MG RE SUPP: 650.0000 mg | Freq: Four times a day (QID) | RECTAL | Status: DC | PRN

## 2021-07-16 MED ORDER — INSULIN ASPART 100 UNIT/ML IJ SOLN
0.0000 [IU] | Freq: Three times a day (TID) | INTRAMUSCULAR | Status: DC
  Administered 2021-07-16: 3 [IU] via SUBCUTANEOUS
  Administered 2021-07-17 (×2): 7 [IU] via SUBCUTANEOUS
  Administered 2021-07-18: 4 [IU] via SUBCUTANEOUS
  Administered 2021-07-18: 11 [IU] via SUBCUTANEOUS

## 2021-07-16 MED ORDER — MORPHINE SULFATE (PF) 4 MG/ML IV SOLN
4.0000 mg | Freq: Once | INTRAVENOUS | Status: AC
  Administered 2021-07-16: 4 mg via INTRAVENOUS
  Filled 2021-07-16: qty 1

## 2021-07-16 MED ORDER — INSULIN ASPART 100 UNIT/ML IJ SOLN
5.0000 [IU] | Freq: Once | INTRAMUSCULAR | Status: AC
  Administered 2021-07-16: 5 [IU] via SUBCUTANEOUS

## 2021-07-16 MED ORDER — SIMETHICONE 80 MG PO CHEW
40.0000 mg | CHEWABLE_TABLET | Freq: Four times a day (QID) | ORAL | Status: DC | PRN
  Administered 2021-07-17 – 2021-07-18 (×2): 40 mg via ORAL
  Filled 2021-07-16 (×3): qty 1

## 2021-07-16 MED ORDER — DIPHENHYDRAMINE HCL 25 MG PO CAPS
25.0000 mg | ORAL_CAPSULE | Freq: Once | ORAL | Status: AC
  Administered 2021-07-16: 25 mg via ORAL
  Filled 2021-07-16: qty 1

## 2021-07-16 MED ORDER — OXYCODONE HCL 5 MG PO TABS
5.0000 mg | ORAL_TABLET | ORAL | Status: DC | PRN
  Administered 2021-07-16 – 2021-07-17 (×3): 10 mg via ORAL
  Administered 2021-07-18: 5 mg via ORAL
  Administered 2021-07-18: 10 mg via ORAL
  Filled 2021-07-16: qty 2
  Filled 2021-07-16: qty 1
  Filled 2021-07-16 (×3): qty 2

## 2021-07-16 MED ORDER — MORPHINE SULFATE (PF) 2 MG/ML IV SOLN
1.0000 mg | INTRAVENOUS | Status: DC | PRN
  Administered 2021-07-16 – 2021-07-17 (×5): 2 mg via INTRAVENOUS
  Filled 2021-07-16 (×5): qty 1

## 2021-07-16 MED ORDER — METOCLOPRAMIDE HCL 5 MG/ML IJ SOLN
10.0000 mg | Freq: Once | INTRAMUSCULAR | Status: AC
Start: 2021-07-16 — End: 2021-07-16
  Administered 2021-07-16: 10 mg via INTRAVENOUS
  Filled 2021-07-16: qty 2

## 2021-07-16 MED ORDER — SODIUM CHLORIDE 0.9 % IV SOLN
2.0000 g | INTRAVENOUS | Status: DC
  Administered 2021-07-16 – 2021-07-17 (×2): 2 g via INTRAVENOUS
  Filled 2021-07-16 (×2): qty 20

## 2021-07-16 MED ORDER — DIPHENHYDRAMINE HCL 50 MG/ML IJ SOLN: 12.5000 mg | Freq: Four times a day (QID) | INTRAMUSCULAR | Status: DC | PRN

## 2021-07-16 MED ORDER — SODIUM CHLORIDE 0.9 % IV BOLUS
1000.0000 mL | Freq: Once | INTRAVENOUS | Status: AC
  Administered 2021-07-16: 1000 mL via INTRAVENOUS

## 2021-07-16 MED ORDER — ONDANSETRON HCL 4 MG/2ML IJ SOLN
4.0000 mg | Freq: Four times a day (QID) | INTRAMUSCULAR | Status: DC | PRN
  Administered 2021-07-16: 4 mg via INTRAVENOUS
  Filled 2021-07-16: qty 2

## 2021-07-16 NOTE — H&P (Addendum)
CC: right sided pain  Requesting provider: n/a  HPI: Melissa Gilmore is an 26 y.o. female who is here for persistent right-sided abdominal pain.  She states that she started having symptoms about 2 weeks ago.  Initially it was intermittent but for the past week it has been fairly constant.  She complains of right-sided pain rating to her side.  Associated with nausea and most recently vomiting.  No fever or chills.  She came to the emergency room on June 8 and was just found to have symptomatic cholelithiasis and was given an outpatient appointment with our office.  She came back early this morning because of persistent pain.  She has not found anything that makes the pain better or worse.  She has been having bowel movements.  No prior abdominal surgery.  No frequent NSAID use.  Is a diabetic and takes 2 diabetes medications.  No dysuria or hematuria.  No hematemesis.  Past Medical History:  Diagnosis Date   Depression    Diabetes mellitus without complication (HCC)    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HCC)    Schizophrenia (HCC)     Past Surgical History:  Procedure Laterality Date   NO PAST SURGERIES      Family History  Problem Relation Age of Onset   Diabetes Maternal Grandmother    Stomach cancer Maternal Grandmother     Social:  reports that she has never smoked. She has never used smokeless tobacco. She reports that she does not currently use alcohol. She reports that she does not currently use drugs.  Allergies: No Known Allergies  Medications: I have reviewed the patient's current medications.   ROS - all of the below systems have been reviewed with the patient and positives are indicated with bold text General: chills, fever or night sweats Eyes: blurry vision or double vision ENT: epistaxis or sore throat Allergy/Immunology: itchy/watery eyes or nasal congestion Hematologic/Lymphatic: bleeding problems, blood clots or swollen lymph nodes Endocrine:  temperature intolerance or unexpected weight changes Breast: new or changing breast lumps or nipple discharge Resp: cough, shortness of breath, or wheezing CV: chest pain or dyspnea on exertion GI: as per HPI GU: dysuria, trouble voiding, or hematuria MSK: joint pain or joint stiffness Neuro: TIA or stroke symptoms Derm: pruritus and skin lesion changes Psych: anxiety and depression  PE Blood pressure 135/74, pulse 71, temperature 98.1 F (36.7 C), temperature source Oral, resp. rate 16, last menstrual period 06/21/2021, SpO2 98 %. Constitutional: NAD; conversant; no deformities; severe obesity Eyes: Moist conjunctiva; no lid lag; anicteric; PERRL Neck: Trachea midline; no thyromegaly Lungs: Normal respiratory effort; no tactile fremitus CV: RRR; no palpable thrills; no pitting edema GI: Abd soft, nondistended, tender to palpation right upper quadrant, no guarding; no palpable hepatosplenomegaly MSK: Normal gait; no clubbing/cyanosis Psychiatric: Appropriate affect; alert and oriented x3 Lymphatic: No palpable cervical or axillary lymphadenopathy Skin: No rash, lesions or jaundice  Results for orders placed or performed during the hospital encounter of 07/16/21 (from the past 48 hour(s))  CBG monitoring, ED     Status: Abnormal   Collection Time: 07/16/21  4:47 AM  Result Value Ref Range   Glucose-Capillary 252 (H) 70 - 99 mg/dL    Comment: Glucose reference range applies only to samples taken after fasting for at least 8 hours.  Lipase, blood     Status: Abnormal   Collection Time: 07/16/21  4:56 AM  Result Value Ref Range   Lipase 128 (H) 11 - 51 U/L  Comment: Performed at [facility], [address] [place], [place] [address]  Comprehensive metabolic panel     Status: Abnormal   Collection Time: 07/16/21  4:56 AM  Result Value Ref Range   Sodium 134 (L) 135 - 145 mmol/L   Potassium 3.8 3.5 - 5.1 mmol/L   Chloride 105 98 - 111 mmol/L   CO2 22 22 - 32 mmol/L    Glucose, Bld 256 (H) 70 - 99 mg/dL    Comment: Glucose reference range applies only to samples taken after fasting for at least 8 hours.   BUN 6 6 - 20 mg/dL   Creatinine, Ser 6.040.68 0.44 - 1.00 mg/dL   Calcium 8.7 (L) 8.9 - 10.3 mg/dL   Total Protein 6.7 6.5 - 8.1 g/dL   Albumin 3.4 (L) 3.5 - 5.0 g/dL   AST 26 15 - 41 U/L   ALT 21 0 - 44 U/L   Alkaline Phosphatase 81 38 - 126 U/L   Total Bilirubin 0.3 0.3 - 1.2 mg/dL    Comment: REPEATED TO VERIFY   GFR, Estimated >60 >60 mL/min    Comment: (NOTE) Calculated using the CKD-EPI Creatinine Equation (2021)    Anion gap 7 5 - 15    Comment: Performed at [facility], [address] [place], [place] [address]  CBC     Status: None   Collection Time: 07/16/21  4:56 AM  Result Value Ref Range   WBC 7.5 4.0 - 10.5 K/uL   RBC 4.60 3.87 - 5.11 MIL/uL   Hemoglobin 13.5 12.0 - 15.0 g/dL   HCT 11.939.0 14.736.0 - 82.946.0 %   MCV 84.8 80.0 - 100.0 fL   MCH 29.3 26.0 - 34.0 pg   MCHC 34.6 30.0 - 36.0 g/dL   RDW 56.212.5 13.011.5 - 86.515.5 %   Platelets 263 150 - 400 K/uL   nRBC 0.0 0.0 - 0.2 %    Comment: Performed at [facility], [address] [place], [place] [address]  Urinalysis, Routine w reflex microscopic Urine, Clean Catch     Status: Abnormal   Collection Time: 07/16/21  5:15 AM  Result Value Ref Range   Color, Urine STRAW (A) YELLOW   APPearance CLEAR CLEAR   Specific Gravity, Urine 1.022 1.005 - 1.030   pH 8.0 5.0 - 8.0   Glucose, UA >=500 (A) NEGATIVE mg/dL   Hgb urine dipstick NEGATIVE NEGATIVE   Bilirubin Urine NEGATIVE NEGATIVE   Ketones, ur NEGATIVE NEGATIVE mg/dL   Protein, ur NEGATIVE NEGATIVE mg/dL   Nitrite NEGATIVE NEGATIVE   Leukocytes,Ua NEGATIVE NEGATIVE   RBC / HPF 0-5 0 - 5 RBC/hpf   WBC, UA 0-5 0 - 5 WBC/hpf   Bacteria, UA RARE (A) NONE SEEN   Squamous Epithelial / LPF 0-5 0 - 5    Comment: Performed at [facility], [address] [place], [place] [address]  I-Stat beta hCG blood, ED     Status: None    Collection Time: 07/16/21  5:32 AM  Result Value Ref Range   I-stat hCG, quantitative <5.0 <5 mIU/mL   Comment 3            Comment:   GEST. AGE      CONC.  (mIU/mL)   <=1 WEEK        5 - 50     2 WEEKS       50 - 500     3 WEEKS  100 - 10,000     4 WEEKS     1,000 - 30,000        FEMALE AND NON-PREGNANT FEMALE:     LESS THAN 5 mIU/mL     CT ABDOMEN PELVIS W CONTRAST  Addendum Date: 07/16/2021   ADDENDUM REPORT: 07/16/2021 10:51 ADDENDUM: 4 mm right lower lobe nodule. In this age group, this can be considered benign with no follow-up recommended. Electronically Signed   By: Amie Portland M.D.   On: 07/16/2021 10:51   Result Date: 07/16/2021 CLINICAL DATA:  Epigastric pain. EXAM: CT ABDOMEN AND PELVIS WITH CONTRAST TECHNIQUE: Multidetector CT imaging of the abdomen and pelvis was performed using the standard protocol following bolus administration of intravenous contrast. RADIATION DOSE REDUCTION: This exam was performed according to the departmental dose-optimization program which includes automated exposure control, adjustment of the mA and/or kV according to patient size and/or use of iterative reconstruction technique. CONTRAST:  OMNIPAQUE IOHEXOL 300 MG/ML  SOLN COMPARISON:  Current right upper quadrant ultrasound. FINDINGS: Lower chest: No acute findings. 4 mm nodule, right lower lobe, image 29, series 5. Hepatobiliary: Decreased liver attenuation consistent with fatty infiltration. No liver mass or focal lesion. Normal gallbladder. Stones noted on ultrasound are not defined on CT. No bile duct dilation. Pancreas: Unremarkable. No pancreatic ductal dilatation or surrounding inflammatory changes. Spleen: Normal in size without focal abnormality. Adrenals/Urinary Tract: Adrenal glands are unremarkable. Kidneys are normal, without renal calculi, focal lesion, or hydronephrosis. Bladder is unremarkable. Stomach/Bowel: Stomach is within normal limits. Appendix appears normal. No evidence  of bowel wall thickening, distention, or inflammatory changes. Vascular/Lymphatic: No significant vascular findings are present. No enlarged abdominal or pelvic lymph nodes. Reproductive: Uterus and bilateral adnexa are unremarkable. Other: No abdominal wall hernia or abnormality. No abdominopelvic ascites. Musculoskeletal: No acute or significant osseous findings. IMPRESSION: 1. No acute findings within the abdomen or pelvis. No findings to account for epigastric pain. 2. Gallstones noted on the current ultrasound are insufficiently radiopaque to be visualized by CT. 3. Hepatic steatosis. Electronically Signed: By: Amie Portland M.D. On: 07/16/2021 10:32   US Abdomen Limited RUQ (LIVER/GB)  Result Date: 07/16/2021 CLINICAL DATA:  Right upper quadrant pain EXAM: ULTRASOUND ABDOMEN LIMITED RIGHT UPPER QUADRANT COMPARISON:  Ultrasound 07/15/2021 FINDINGS: Gallbladder: Cholelithiasis with multiple intraluminal stones largest measuring up to 0.9 cm. The gallbladder is nondilated. No wall thickening or pericholecystic fluid. There is a reportedly positive sonographic Murphy sign, however the gallbladder is nondilated and thus this is nonspecific. Common bile duct: Diameter: 4.8 mm Liver: Diffusely increased liver echogenicity. Portal vein is patent on color Doppler imaging with normal direction of blood flow towards the liver. Other: None. IMPRESSION: Cholelithiasis with multiple intraluminal stones largest measuring up to 0.9 cm. No evidence of cholecystitis. Reportedly positive sonographic Murphy's is nonspecific in the setting of a nondilated gallbladder. Diffuse hepatic steatosis. Electronically Signed   By: Caprice Renshaw M.D.   On: 07/16/2021 10:17   US Abdomen Limited  Result Date: 07/15/2021 CLINICAL DATA:  Right upper quadrant pain EXAM: ULTRASOUND ABDOMEN LIMITED RIGHT UPPER QUADRANT COMPARISON:  None Available. FINDINGS: Gallbladder: Well distended with gallstones within. Negative sonographic Murphy's sign  is elicited. Common bile duct: Diameter: 4 mm. Liver: Diffusely increased in echogenicity consistent with fatty infiltration. No focal mass is noted. Portal vein is patent on color Doppler imaging with normal direction of blood flow towards the liver. Other: None. IMPRESSION: Cholelithiasis without complicating factors. Fatty liver. No other focal abnormality is noted. Electronically  Signed   By: Alcide Clever M.D.   On: 07/15/2021 01:22    Imaging: Personally reviewed  A/P: Melissa Gilmore Clotilde Dieter is an 26 y.o. female with  Symptomatic cholelithiasis possible early acute cholecystitis Elevated lipase Severe obesity Diabetes mellitus type 2  I believe the patient's symptoms are consistent with gallbladder disease.  I think the patient has failed outpatient management.  I believe she also may developing early acute cholecystitis given her persistent tenderness on exam despite being here for multiple hours.  Therefore I recommended admission, IV antibiotics and cholecystectomy  I discussed laparoscopic cholecystectomy with IOC in detail.   We discussed the risks and benefits of a laparoscopic cholecystectomy including, but not limited to bleeding, infection, injury to surrounding structures such as the intestine or liver, bile leak, retained gallstones, need to convert to an open procedure, prolonged diarrhea, blood clots such as  DVT, common bile duct injury, anesthesia risks, and possible need for additional procedures.  We discussed the typical post-operative recovery course. I explained that the likelihood of improvement of their symptoms is good.  We discussed that we will tentatively plan her surgery for tomorrow morning but if we get multiple higher acuity surgical issues we may have to bump her surgery to Monday  Clear liquid diet today N.p.o. except meds after midnight Chemical VTE prophylaxis IV antibiotic for mild cholecystitis Sliding scale insulin Repeat labs in am  Moderate  level of medical decision making.  She has worsening symptomatic cholelithiasis that is failed outpatient management.  Decision to operate.  Her severe obesity increases her risk for perioperative complications.  Mary Sella. Andrey Campanile, MD, FACS General, Bariatric, & Minimally Invasive Surgery [facility], [place]

## 2021-07-16 NOTE — Anesthesia Preprocedure Evaluation (Deleted)
Anesthesia Evaluation    Airway        Dental   Pulmonary           Cardiovascular      Neuro/Psych Depression Schizophrenia    GI/Hepatic   Endo/Other  diabetes, Insulin DependentMorbid obesity  Renal/GU      Musculoskeletal   Abdominal   Peds  Hematology   Anesthesia Other Findings   Reproductive/Obstetrics                             Anesthesia Physical Anesthesia Plan  ASA: 3  Anesthesia Plan: General   Post-op Pain Management: Tylenol PO (pre-op)*   Induction: Intravenous  PONV Risk Score and Plan: 3 and Ondansetron, Dexamethasone and Scopolamine patch - Pre-op  Airway Management Planned: Oral ETT  Additional Equipment: None  Intra-op Plan:   Post-operative Plan: Extubation in OR  Informed Consent:   Plan Discussed with:   Anesthesia Plan Comments:         Anesthesia Quick Evaluation

## 2021-07-16 NOTE — ED Provider Notes (Signed)
[facility] EMERGENCY DEPARTMENT Provider Note   CSN: [id number] Arrival date & time: 07/16/21  0442     History  Chief Complaint  Patient presents with   Abdominal Pain    Melissa Gilmore is a 26 y.o. female.   Abdominal Pain A 26 year old female presented emergency room today for abdominal pain.  She has a past medical history significant for DM 2, on insulin  Patient is presenting to the emergency room with complaint of upper abdominal pain.  She states that she has been having symptoms intermittently for the past 2 weeks but states that they have been gradually worsening.  She was seen overnight 6/8-6/9 and diagnosed with gallstones seems that her pain was controlled and she had no evidence of cholecystitis and was discharged home with treatment for UTI given that she had urinary symptoms at the time.  She is back today with continued upper abdominal pain.     Home Medications Prior to Admission medications   Medication Sig Start Date End Date Taking? Authorizing Provider  Accu-Chek Softclix Lancets lancets Use to check blood sugar twice daily. Dx E11.65 08/06/20   Charlott Rakes, MD  Blood Glucose Monitoring Suppl (ACCU-CHEK GUIDE ME) w/Device KIT Use to check blood sugar twice daily. Dx E11.65 08/06/20   Charlott Rakes, MD  cephALEXin (KEFLEX) 500 MG capsule Take 1 capsule (500 mg total) by mouth 2 (two) times daily. 07/15/21   Montine Circle, PA-C  cetirizine (ZYRTEC) 10 MG tablet Take 1 tablet (10 mg total) by mouth daily. 07/14/21   Teodora Medici, FNP  fluticasone (FLONASE) 50 MCG/ACT nasal spray Place 1 spray into both nostrils daily for 3 days. 07/14/21 07/17/21  Teodora Medici, FNP  glucose blood (ACCU-CHEK GUIDE) test strip Use to check blood sugar twice daily. Dx E11.65 08/06/20   Charlott Rakes, MD  HYDROcodone-acetaminophen (NORCO/VICODIN) 5-325 MG tablet Take 1-2 tablets by mouth every 6 (six) hours as needed. 07/15/21   Montine Circle, PA-C   insulin glargine (LANTUS SOLOSTAR) 100 UNIT/ML Solostar Pen Inject 20 Units into the skin daily. 11/08/20 07/16/22  Gildardo Pounds, NP  Insulin Pen Needle (B-D UF III MINI PEN NEEDLES) 31G X 5 MM MISC Use as instructed. Inject into the skin once daily 11/08/20   Gildardo Pounds, NP  liraglutide (VICTOZA) 18 MG/3ML SOPN Inject 1.8 mg into the skin daily. 11/08/20   Gildardo Pounds, NP  naproxen (NAPROSYN) 500 MG tablet Take 1 tablet (500 mg total) by mouth 2 (two) times daily as needed for moderate pain. 08/21/20   Petrucelli, Samantha R, PA-C  Norgestimate-Ethinyl Estradiol Triphasic (TRI-SPRINTEC) 0.18/0.215/0.25 MG-35 MCG tablet Take 1 tablet by mouth daily. 05/10/21   Gildardo Pounds, NP  ondansetron (ZOFRAN-ODT) 4 MG disintegrating tablet Take 1 tablet (4 mg total) by mouth every 8 (eight) hours as needed for nausea or vomiting. 07/15/21   Montine Circle, PA-C      Allergies    Patient has no known allergies.    Review of Systems   Review of Systems  Gastrointestinal:  Positive for abdominal pain.    Physical Exam Updated Vital Signs BP 116/75   Pulse 65   Temp 98.5 F (36.9 C) (Oral)   Resp 15   LMP 06/21/2021 (Exact Date)   SpO2 98%  Physical Exam Vitals and nursing note reviewed.  Constitutional:      General: She is not in acute distress. HENT:     Head: Normocephalic and atraumatic.  Nose: Nose normal.  Eyes:     General: No scleral icterus. Cardiovascular:     Rate and Rhythm: Normal rate and regular rhythm.     Pulses: Normal pulses.     Heart sounds: Normal heart sounds.  Pulmonary:     Effort: Pulmonary effort is normal. No respiratory distress.     Breath sounds: No wheezing.  Abdominal:     Palpations: Abdomen is soft.     Tenderness: There is abdominal tenderness in the right upper quadrant and epigastric area. Positive signs include Murphy's sign.  Musculoskeletal:     Cervical back: Normal range of motion.     Right lower leg: No edema.     Left  lower leg: No edema.  Skin:    General: Skin is warm and dry.     Capillary Refill: Capillary refill takes less than 2 seconds.  Neurological:     Mental Status: She is alert. Mental status is at baseline.  Psychiatric:        Mood and Affect: Mood normal.        Behavior: Behavior normal.     ED Results / Procedures / Treatments   Labs (all labs ordered are listed, but only abnormal results are displayed) Labs Reviewed  LIPASE, BLOOD - Abnormal; Notable for the following components:      Result Value   Lipase 128 (*)    All other components within normal limits  COMPREHENSIVE METABOLIC PANEL - Abnormal; Notable for the following components:   Sodium 134 (*)    Glucose, Bld 256 (*)    Calcium 8.7 (*)    Albumin 3.4 (*)    All other components within normal limits  URINALYSIS, ROUTINE W REFLEX MICROSCOPIC - Abnormal; Notable for the following components:   Color, Urine STRAW (*)    Glucose, UA >=500 (*)    Bacteria, UA RARE (*)    All other components within normal limits  CBG MONITORING, ED - Abnormal; Notable for the following components:   Glucose-Capillary 252 (*)    All other components within normal limits  CBC  I-STAT BETA HCG BLOOD, ED ([facility], [facility], [facility] ONLY)    EKG None  Radiology CT ABDOMEN PELVIS W CONTRAST  Addendum Date: 07/16/2021   ADDENDUM REPORT: 07/16/2021 10:51 ADDENDUM: 4 mm right lower lobe nodule. In this age group, this can be considered benign with no follow-up recommended. Electronically Signed   By: Lajean Manes M.D.   On: 07/16/2021 10:51   Result Date: 07/16/2021 CLINICAL DATA:  Epigastric pain. EXAM: CT ABDOMEN AND PELVIS WITH CONTRAST TECHNIQUE: Multidetector CT imaging of the abdomen and pelvis was performed using the standard protocol following bolus administration of intravenous contrast. RADIATION DOSE REDUCTION: This exam was performed according to the departmental dose-optimization program which includes automated exposure control,  adjustment of the mA and/or kV according to patient size and/or use of iterative reconstruction technique. CONTRAST:  195m OMNIPAQUE IOHEXOL 300 MG/ML  SOLN COMPARISON:  Current right upper quadrant ultrasound. FINDINGS: Lower chest: No acute findings. 4 mm nodule, right lower lobe, image 29, series 5. Hepatobiliary: Decreased liver attenuation consistent with fatty infiltration. No liver mass or focal lesion. Normal gallbladder. Stones noted on ultrasound are not defined on CT. No bile duct dilation. Pancreas: Unremarkable. No pancreatic ductal dilatation or surrounding inflammatory changes. Spleen: Normal in size without focal abnormality. Adrenals/Urinary Tract: Adrenal glands are unremarkable. Kidneys are normal, without renal calculi, focal lesion, or hydronephrosis. Bladder is unremarkable. Stomach/Bowel: Stomach is  within normal limits. Appendix appears normal. No evidence of bowel wall thickening, distention, or inflammatory changes. Vascular/Lymphatic: No significant vascular findings are present. No enlarged abdominal or pelvic lymph nodes. Reproductive: Uterus and bilateral adnexa are unremarkable. Other: No abdominal wall hernia or abnormality. No abdominopelvic ascites. Musculoskeletal: No acute or significant osseous findings. IMPRESSION: 1. No acute findings within the abdomen or pelvis. No findings to account for epigastric pain. 2. Gallstones noted on the current ultrasound are insufficiently radiopaque to be visualized by CT. 3. Hepatic steatosis. Electronically Signed: By: Lajean Manes M.D. On: 07/16/2021 10:32   US Abdomen Limited RUQ (LIVER/GB)  Result Date: 07/16/2021 CLINICAL DATA:  Right upper quadrant pain EXAM: ULTRASOUND ABDOMEN LIMITED RIGHT UPPER QUADRANT COMPARISON:  Ultrasound 07/15/2021 FINDINGS: Gallbladder: Cholelithiasis with multiple intraluminal stones largest measuring up to 0.9 cm. The gallbladder is nondilated. No wall thickening or pericholecystic fluid. There is a  reportedly positive sonographic Murphy sign, however the gallbladder is nondilated and thus this is nonspecific. Common bile duct: Diameter: 4.8 mm Liver: Diffusely increased liver echogenicity. Portal vein is patent on color Doppler imaging with normal direction of blood flow towards the liver. Other: None. IMPRESSION: Cholelithiasis with multiple intraluminal stones largest measuring up to 0.9 cm. No evidence of cholecystitis. Reportedly positive sonographic Murphy's is nonspecific in the setting of a nondilated gallbladder. Diffuse hepatic steatosis. Electronically Signed   By: Maurine Simmering M.D.   On: 07/16/2021 10:17   US Abdomen Limited  Result Date: 07/15/2021 CLINICAL DATA:  Right upper quadrant pain EXAM: ULTRASOUND ABDOMEN LIMITED RIGHT UPPER QUADRANT COMPARISON:  None Available. FINDINGS: Gallbladder: Well distended with gallstones within. Negative sonographic Murphy's sign is elicited. Common bile duct: Diameter: 4 mm. Liver: Diffusely increased in echogenicity consistent with fatty infiltration. No focal mass is noted. Portal vein is patent on color Doppler imaging with normal direction of blood flow towards the liver. Other: None. IMPRESSION: Cholelithiasis without complicating factors. Fatty liver. No other focal abnormality is noted. Electronically Signed   By: Inez Catalina M.D.   On: 07/15/2021 01:22    Procedures Procedures    Medications Ordered in ED Medications  ondansetron (ZOFRAN-ODT) disintegrating tablet 4 mg (4 mg Oral Given 07/16/21 0459)  morphine (PF) 4 MG/ML injection 4 mg (4 mg Intravenous Given 07/16/21 0946)  sodium chloride 0.9 % bolus 1,000 mL (1,000 mLs Intravenous Bolus 07/16/21 0945)  metoCLOPramide (REGLAN) injection 10 mg (10 mg Intravenous Given 07/16/21 0947)  diphenhydrAMINE (BENADRYL) capsule 25 mg (25 mg Oral Given 07/16/21 0943)  famotidine (PEPCID) IVPB 20 mg premix (0 mg Intravenous Stopped 07/16/21 1019)  insulin aspart (novoLOG) injection 5 Units (5 Units  Subcutaneous Given 07/16/21 1121)  iohexol (OMNIPAQUE) 300 MG/ML solution 100 mL (100 mLs Intravenous Contrast Given 07/16/21 1019)  HYDROmorphone (DILAUDID) injection 0.5 mg (0.5 mg Intravenous Given 07/16/21 1121)    ED Course/ Medical Decision Making/ A&P Clinical Course as of 07/16/21 1412  Sat Jul 16, 2021  1013 Panc/choledoco - imaging [WF]  1045 1. No acute findings within the abdomen or pelvis. No findings to account for epigastric pain. 2. Gallstones noted on the current ultrasound are insufficiently radiopaque to be visualized by CT. 3. Hepatic steatosis.   [WF]  1045 IMPRESSION: Cholelithiasis with multiple intraluminal stones largest measuring up to 0.9 cm. No evidence of cholecystitis. Reportedly positive sonographic Murphy's is nonspecific in the setting of a nondilated gallbladder.  Diffuse hepatic steatosis.   Electronically Signed   By: Maurine Simmering M.D.   On: 07/16/2021 10:17   [  WF]  1100 Pain meds / re-eval [WF]  1250 Discussed with Greer Pickerel of general surgery.  He will come evaluate pt. Pt updated [WF]    Clinical Course User Index [WF] Tedd Sias, [place]                           Medical Decision Making Amount and/or Complexity of Data Reviewed Labs: ordered. Radiology: ordered.  Risk Prescription drug management. Decision regarding hospitalization.   This patient presents to the ED for concern of upper quad abdominal pain, this involves a number of treatment options, and is a complaint that carries with it a moderate to high risk of complications and morbidity.    The causes of generalized abdominal pain include but are not limited to AAA, mesenteric ischemia, appendicitis, diverticulitis, DKA, gastritis, gastroenteritis, AMI, nephrolithiasis, pancreatitis, peritonitis, adrenal insufficiency,lead poisoning, iron toxicity, intestinal ischemia, constipation, UTI,SBO/LBO, splenic rupture, biliary disease, IBD, IBS, PUD, or hepatitis. Ectopic  pregnancy, ovarian torsion, PID.   Co morbidities: Discussed in HPI   Brief History:  A 26 year old female presented emergency room today for abdominal pain.  She has a past medical history significant for DM 2, on insulin  Patient is presenting to the emergency room with complaint of upper abdominal pain.  She states that she has been having symptoms intermittently for the past 2 weeks but states that they have been gradually worsening.  She was seen overnight 6/8-6/9 and diagnosed with gallstones seems that her pain was controlled and she had no evidence of cholecystitis and was discharged home with treatment for UTI given that she had urinary symptoms at the time.  She is back today with continued upper abdominal pain.   Given the patient is quite tender in the right upper quadrant, has known gallstones and now has a mild elevation in lipase I do have some concern for choledocholithiasis.  Or gallstone pancreatitis.  She states that she does not drink alcohol regularly last drink 7 days ago and only had 2 drinks.    EMR reviewed including pt PMHx, past surgical history and past visits to ER.   See HPI for more details   Lab Tests:   I ordered and independently interpreted labs. Labs notable for leukocytosis.  CMP with hyperglycemia otherwise unremarkable.  As hCG negative for pregnancy.  Urinalysis unremarkable.  Lipase elevated in the context of known Coley lithiasis some concern for gallstone pancreatitis versus choledocholithiasis.   Imaging Studies:  Right upper quadrant ultrasound and CT abdomen pelvis with contrast ordered and pending  Abnormal findings. I personally reviewed all imaging studies. Imaging notable for gallstones noted on right upper quadrant ultrasound.  Positive sonographic Murphy sign. Largest intraluminal gallstone was 0.9 cm.  No evidence of pancreatitis on CT abdomen pelvis with contrast   Cardiac Monitoring:  NA NA   Medicines ordered:  I  ordered medication including insulin, Pepcid, Benadryl, Reglan, morphine, 1 L normal saline for pain, nausea, dehydration Reevaluation of the patient after these medicines showed that the patient improved I have reviewed the patients home medicines and have made adjustments as needed   Critical Interventions:     Consults/Attending Physician   I discussed this case with my attending physician who cosigned this note including patient's presenting symptoms, physical exam, and planned diagnostics and interventions. Attending physician stated agreement with plan or made changes to plan which were implemented.   Reevaluation:  After the interventions noted above I re-evaluated patient and found  that they have :improved   Social Determinants of Health:      Problem List / ED Course:  Right upper quadrant abdominal pain with Percell Miller sign no other indications the patient is experiencing cholecystitis seems to be symptomatic cholelithiasis.  CT and ultrasound reviewed personally by me.  See images section for review.  Patient has multiple gallbladder stones.  Common bile duct is normal size.  No pericholecystic fluid.  No leukocytosis.  I discussed with general surgery Dr. Redmond Pulling given that this is the patient's second visit within 24 hours he is planning on evaluating patient and likely admitting.   Dispostion:  After consideration of the diagnostic results and the patients response to treatment, I feel that the patent would benefit from admission  General surgery will admit.   Final Clinical Impression(s) / ED Diagnoses Final diagnoses:  Calculus of gallbladder without cholecystitis without obstruction  Symptomatic cholelithiasis    Rx / DC Orders ED Discharge Orders     None         Tedd Sias, [place] 07/16/21 1413    Godfrey Pick, MD 07/17/21 1706

## 2021-07-16 NOTE — ED Triage Notes (Signed)
Pt brought to ED by [facility] for evaluation of RUQ abdominal pain. Pt was recently diagnosed with gallstones yesterday and states she could not afford to get medications prescribed to her. Also endorses nausea.

## 2021-07-17 ENCOUNTER — Encounter (HOSPITAL_COMMUNITY)
Admission: EM | Disposition: A | Discharge status: Home / Self Care | Payer: Self-pay | Source: Home / Self Care | Attending: Emergency Medicine

## 2021-07-17 ENCOUNTER — Encounter (HOSPITAL_COMMUNITY): Payer: Self-pay

## 2021-07-17 ENCOUNTER — Observation Stay (HOSPITAL_COMMUNITY): Payer: Medicaid Other

## 2021-07-17 ENCOUNTER — Observation Stay (HOSPITAL_BASED_OUTPATIENT_CLINIC_OR_DEPARTMENT_OTHER): Payer: Medicaid Other | Admitting: Anesthesiology

## 2021-07-17 ENCOUNTER — Other Ambulatory Visit: Payer: Self-pay

## 2021-07-17 ENCOUNTER — Observation Stay (HOSPITAL_COMMUNITY): Payer: Medicaid Other | Admitting: Anesthesiology

## 2021-07-17 DIAGNOSIS — K819 Cholecystitis, unspecified: Secondary | ICD-10-CM

## 2021-07-17 DIAGNOSIS — F4481 Dissociative identity disorder: Secondary | ICD-10-CM

## 2021-07-17 DIAGNOSIS — E785 Hyperlipidemia, unspecified: Secondary | ICD-10-CM

## 2021-07-17 DIAGNOSIS — E119 Type 2 diabetes mellitus without complications: Secondary | ICD-10-CM

## 2021-07-17 DIAGNOSIS — Z794 Long term (current) use of insulin: Secondary | ICD-10-CM

## 2021-07-17 DIAGNOSIS — K8012 Calculus of gallbladder with acute and chronic cholecystitis without obstruction: Secondary | ICD-10-CM | POA: Diagnosis not present

## 2021-07-17 DIAGNOSIS — F209 Schizophrenia, unspecified: Secondary | ICD-10-CM

## 2021-07-17 DIAGNOSIS — Z8742 Personal history of other diseases of the female genital tract: Secondary | ICD-10-CM

## 2021-07-17 DIAGNOSIS — F32A Depression, unspecified: Secondary | ICD-10-CM

## 2021-07-17 DIAGNOSIS — Z6835 Body mass index (BMI) 35.0-35.9, adult: Secondary | ICD-10-CM

## 2021-07-17 HISTORY — PX: CHOLECYSTECTOMY: SHX55

## 2021-07-17 HISTORY — DX: Hyperlipidemia, unspecified: E78.5

## 2021-07-17 HISTORY — DX: Personal history of other diseases of the female genital tract: Z87.42

## 2021-07-17 LAB — COMPREHENSIVE METABOLIC PANEL
ALT: 23 U/L (ref 0–44)
AST: 36 U/L (ref 15–41)
Albumin: 3 g/dL — ABNORMAL LOW (ref 3.5–5.0)
Alkaline Phosphatase: 47 U/L (ref 38–126)
Anion gap: 7 (ref 5–15)
BUN: 5 mg/dL — ABNORMAL LOW (ref 6–20)
CO2: 24 mmol/L (ref 22–32)
Calcium: 8 mg/dL — ABNORMAL LOW (ref 8.9–10.3)
Chloride: 107 mmol/L (ref 98–111)
Creatinine, Ser: 0.59 mg/dL (ref 0.44–1.00)
GFR, Estimated: >60 mL/min (ref >60)
Glucose, Bld: 115 mg/dL — ABNORMAL HIGH (ref 70–99)
Potassium: 3.6 mmol/L (ref 3.5–5.1)
Sodium: 138 mmol/L (ref 135–145)
Total Bilirubin: 0.4 mg/dL (ref 0.3–1.2)
Total Protein: 6.3 g/dL — ABNORMAL LOW (ref 6.5–8.1)

## 2021-07-17 LAB — GLUCOSE, CAPILLARY
Glucose-Capillary: 198 mg/dL — ABNORMAL HIGH (ref 70–99)
Glucose-Capillary: 234 mg/dL — ABNORMAL HIGH (ref 70–99)
Glucose-Capillary: 209 mg/dL — ABNORMAL HIGH (ref 70–99)
Glucose-Capillary: 174 mg/dL — ABNORMAL HIGH (ref 70–99)

## 2021-07-17 LAB — HEMOGLOBIN A1C
Hgb A1c MFr Bld: 8.3 % — ABNORMAL HIGH (ref 4.8–5.6)
Mean Plasma Glucose: 191.51 mg/dL

## 2021-07-17 LAB — LIPASE, BLOOD: Lipase: 31 U/L (ref 11–51)

## 2021-07-17 SURGERY — LAPAROSCOPIC CHOLECYSTECTOMY WITH INTRAOPERATIVE CHOLANGIOGRAM
Anesthesia: General | Site: Abdomen

## 2021-07-17 SURGERY — LAPAROSCOPIC CHOLECYSTECTOMY WITH INTRAOPERATIVE CHOLANGIOGRAM
Anesthesia: General

## 2021-07-17 MED ORDER — ROCURONIUM BROMIDE 10 MG/ML (PF) SYRINGE
PREFILLED_SYRINGE | INTRAVENOUS | Status: DC | PRN
  Administered 2021-07-17: 20 mg via INTRAVENOUS
  Administered 2021-07-17: 60 mg via INTRAVENOUS

## 2021-07-17 MED ORDER — ONDANSETRON HCL 4 MG/2ML IJ SOLN
INTRAMUSCULAR | Status: AC
  Filled 2021-07-17: qty 2

## 2021-07-17 MED ORDER — PROCHLORPERAZINE EDISYLATE 10 MG/2ML IJ SOLN: 5.0000 mg | INTRAMUSCULAR | Status: DC | PRN

## 2021-07-17 MED ORDER — LIDOCAINE HCL (PF) 2 % IJ SOLN
INTRAMUSCULAR | Status: AC
  Filled 2021-07-17: qty 5

## 2021-07-17 MED ORDER — TRAMADOL HCL 50 MG PO TABS
50.0000 mg | ORAL_TABLET | Freq: Four times a day (QID) | ORAL | Status: DC | PRN
  Administered 2021-07-18: 100 mg via ORAL
  Filled 2021-07-17: qty 2

## 2021-07-17 MED ORDER — BUPIVACAINE-EPINEPHRINE 0.25% -1:200000 IJ SOLN
INTRAMUSCULAR | Status: DC | PRN
  Administered 2021-07-17: 30 mL

## 2021-07-17 MED ORDER — PROPOFOL 10 MG/ML IV BOLUS
INTRAVENOUS | Status: AC
  Filled 2021-07-17: qty 20

## 2021-07-17 MED ORDER — FENTANYL CITRATE (PF) 100 MCG/2ML IJ SOLN
INTRAMUSCULAR | Status: DC | PRN
  Administered 2021-07-17 (×2): 50 ug via INTRAVENOUS
  Administered 2021-07-17: 100 ug via INTRAVENOUS

## 2021-07-17 MED ORDER — SODIUM CHLORIDE 0.9% FLUSH: 3.0000 mL | INTRAVENOUS | Status: DC | PRN

## 2021-07-17 MED ORDER — LIDOCAINE HCL (PF) 2 % IJ SOLN
INTRAMUSCULAR | Status: AC
  Filled 2021-07-17: qty 10

## 2021-07-17 MED ORDER — ENALAPRILAT 1.25 MG/ML IV SOLN: 0.6250 mg | Freq: Four times a day (QID) | INTRAVENOUS | Status: DC | PRN

## 2021-07-17 MED ORDER — SUGAMMADEX SODIUM 500 MG/5ML IV SOLN
INTRAVENOUS | Status: AC
  Filled 2021-07-17: qty 5

## 2021-07-17 MED ORDER — PROPOFOL 10 MG/ML IV BOLUS
INTRAVENOUS | Status: DC | PRN
  Administered 2021-07-17: 200 mg via INTRAVENOUS

## 2021-07-17 MED ORDER — FENTANYL CITRATE PF 50 MCG/ML IJ SOSY
PREFILLED_SYRINGE | INTRAMUSCULAR | Status: AC
  Filled 2021-07-17: qty 3

## 2021-07-17 MED ORDER — MIDAZOLAM HCL 2 MG/2ML IJ SOLN
INTRAMUSCULAR | Status: AC
  Filled 2021-07-17: qty 2

## 2021-07-17 MED ORDER — ONDANSETRON HCL 4 MG/2ML IJ SOLN
INTRAMUSCULAR | Status: DC | PRN
  Administered 2021-07-17: 4 mg via INTRAVENOUS

## 2021-07-17 MED ORDER — SODIUM CHLORIDE (PF) 0.9 % IJ SOLN
INTRAMUSCULAR | Status: DC | PRN
  Administered 2021-07-17: 10 mL

## 2021-07-17 MED ORDER — FENTANYL CITRATE PF 50 MCG/ML IJ SOSY
25.0000 ug | PREFILLED_SYRINGE | INTRAMUSCULAR | Status: DC | PRN
  Administered 2021-07-17 (×3): 50 ug via INTRAVENOUS

## 2021-07-17 MED ORDER — TRAMADOL HCL 50 MG PO TABS: 50.0000 mg | ORAL_TABLET | Freq: Four times a day (QID) | ORAL | 0 refills | Status: DC | PRN

## 2021-07-17 MED ORDER — BUPIVACAINE-EPINEPHRINE (PF) 0.25% -1:200000 IJ SOLN
INTRAMUSCULAR | Status: AC
  Filled 2021-07-17: qty 30

## 2021-07-17 MED ORDER — SODIUM CHLORIDE 0.9% FLUSH
3.0000 mL | Freq: Two times a day (BID) | INTRAVENOUS | Status: DC
  Administered 2021-07-17: 3 mL via INTRAVENOUS

## 2021-07-17 MED ORDER — DEXMEDETOMIDINE (PRECEDEX) IN NS 20 MCG/5ML (4 MCG/ML) IV SYRINGE
PREFILLED_SYRINGE | INTRAVENOUS | Status: DC | PRN
  Administered 2021-07-17: 20 ug via INTRAVENOUS

## 2021-07-17 MED ORDER — SCOPOLAMINE 1 MG/3DAYS TD PT72
MEDICATED_PATCH | TRANSDERMAL | Status: AC
  Filled 2021-07-17: qty 1

## 2021-07-17 MED ORDER — MENTHOL 3 MG MT LOZG: 1.0000 | LOZENGE | OROMUCOSAL | Status: DC | PRN

## 2021-07-17 MED ORDER — 0.9 % SODIUM CHLORIDE (POUR BTL) OPTIME
TOPICAL | Status: DC | PRN
  Administered 2021-07-17: 1000 mL

## 2021-07-17 MED ORDER — ALUM & MAG HYDROXIDE-SIMETH 200-200-20 MG/5ML PO SUSP: 30.0000 mL | Freq: Four times a day (QID) | ORAL | Status: DC | PRN

## 2021-07-17 MED ORDER — PHENOL 1.4 % MT LIQD: 2.0000 | OROMUCOSAL | Status: DC | PRN

## 2021-07-17 MED ORDER — MIDAZOLAM HCL 5 MG/5ML IJ SOLN
INTRAMUSCULAR | Status: DC | PRN
  Administered 2021-07-17: 2 mg via INTRAVENOUS

## 2021-07-17 MED ORDER — BUPIVACAINE LIPOSOME 1.3 % IJ SUSP
INTRAMUSCULAR | Status: AC
  Filled 2021-07-17: qty 20

## 2021-07-17 MED ORDER — SCOPOLAMINE 1 MG/3DAYS TD PT72
1.0000 | MEDICATED_PATCH | TRANSDERMAL | Status: DC
  Administered 2021-07-17: 1.5 mg via TRANSDERMAL
  Filled 2021-07-17: qty 1

## 2021-07-17 MED ORDER — BUPIVACAINE LIPOSOME 1.3 % IJ SUSP
INTRAMUSCULAR | Status: DC | PRN
  Administered 2021-07-17: 20 mL

## 2021-07-17 MED ORDER — PROPOFOL 10 MG/ML IV BOLUS
INTRAVENOUS | Status: AC
Start: 2021-07-17 — End: ?
  Filled 2021-07-17: qty 20

## 2021-07-17 MED ORDER — LIDOCAINE 2% (20 MG/ML) 5 ML SYRINGE
INTRAMUSCULAR | Status: DC | PRN
  Administered 2021-07-17: 100 mg via INTRAVENOUS

## 2021-07-17 MED ORDER — ONDANSETRON 4 MG PO TBDP: 4.0000 mg | ORAL_TABLET | Freq: Three times a day (TID) | ORAL | 2 refills | Status: DC | PRN

## 2021-07-17 MED ORDER — SODIUM CHLORIDE 0.9 % IV SOLN: 250.0000 mL | INTRAVENOUS | Status: DC | PRN

## 2021-07-17 MED ORDER — METOPROLOL TARTRATE 5 MG/5ML IV SOLN: 5.0000 mg | Freq: Four times a day (QID) | INTRAVENOUS | Status: DC | PRN

## 2021-07-17 MED ORDER — METHOCARBAMOL 1000 MG/10ML IJ SOLN: 1000.0000 mg | Freq: Four times a day (QID) | INTRAVENOUS | Status: DC | PRN

## 2021-07-17 MED ORDER — LACTATED RINGERS IV BOLUS: 1000.0000 mL | Freq: Three times a day (TID) | INTRAVENOUS | Status: DC | PRN

## 2021-07-17 MED ORDER — ACETAMINOPHEN 500 MG PO TABS
500.0000 mg | ORAL_TABLET | Freq: Four times a day (QID) | ORAL | Status: DC
  Administered 2021-07-17 – 2021-07-18 (×5): 500 mg via ORAL
  Filled 2021-07-17 (×5): qty 1

## 2021-07-17 MED ORDER — STERILE WATER FOR IRRIGATION IR SOLN
Status: DC | PRN
  Administered 2021-07-17: 1000 mL

## 2021-07-17 MED ORDER — LACTATED RINGERS IV SOLN: INTRAVENOUS | Status: DC | PRN

## 2021-07-17 MED ORDER — ROCURONIUM BROMIDE 10 MG/ML (PF) SYRINGE
PREFILLED_SYRINGE | INTRAVENOUS | Status: AC
  Filled 2021-07-17: qty 10

## 2021-07-17 MED ORDER — FENTANYL CITRATE (PF) 250 MCG/5ML IJ SOLN
INTRAMUSCULAR | Status: AC
  Filled 2021-07-17: qty 5

## 2021-07-17 MED ORDER — DEXAMETHASONE SODIUM PHOSPHATE 10 MG/ML IJ SOLN
INTRAMUSCULAR | Status: DC | PRN
  Administered 2021-07-17: 4 mg via INTRAVENOUS

## 2021-07-17 MED ORDER — CALCIUM POLYCARBOPHIL 625 MG PO TABS
625.0000 mg | ORAL_TABLET | Freq: Two times a day (BID) | ORAL | Status: DC
  Administered 2021-07-17 – 2021-07-18 (×3): 625 mg via ORAL
  Filled 2021-07-17 (×3): qty 1

## 2021-07-17 MED ORDER — BISACODYL 10 MG RE SUPP: 10.0000 mg | Freq: Two times a day (BID) | RECTAL | Status: DC | PRN

## 2021-07-17 MED ORDER — MAGIC MOUTHWASH
15.0000 mL | Freq: Four times a day (QID) | ORAL | Status: DC | PRN
  Filled 2021-07-17: qty 15

## 2021-07-17 MED ORDER — LIP MEDEX EX OINT
TOPICAL_OINTMENT | Freq: Two times a day (BID) | CUTANEOUS | Status: DC
  Filled 2021-07-17: qty 7

## 2021-07-17 MED ORDER — DEXAMETHASONE SODIUM PHOSPHATE 10 MG/ML IJ SOLN
INTRAMUSCULAR | Status: AC
  Filled 2021-07-17: qty 1

## 2021-07-17 MED ORDER — METHOCARBAMOL 500 MG PO TABS
1000.0000 mg | ORAL_TABLET | Freq: Four times a day (QID) | ORAL | Status: DC | PRN
  Administered 2021-07-17 – 2021-07-18 (×2): 1000 mg via ORAL
  Filled 2021-07-17 (×2): qty 2

## 2021-07-17 SURGICAL SUPPLY — 49 items
APL PRP STRL LF DISP 70% ISPRP (MISCELLANEOUS) ×1
APPLIER CLIP 5 13 M/L LIGAMAX5 (MISCELLANEOUS) ×2
APR CLP MED LRG 5 ANG JAW (MISCELLANEOUS) ×1
BAG COUNTER SPONGE SURGICOUNT (BAG) IMPLANT
BAG SPEC RTRVL 10 TROC 200 (ENDOMECHANICALS) ×1
BAG SPNG CNTER NS LX DISP (BAG)
CABLE HIGH FREQUENCY MONO STRZ (ELECTRODE) ×3 IMPLANT
CHLORAPREP W/TINT 26 (MISCELLANEOUS) ×3 IMPLANT
CLIP APPLIE 5 13 M/L LIGAMAX5 (MISCELLANEOUS) ×2 IMPLANT
COVER MAYO STAND STRL (DRAPES) ×3 IMPLANT
COVER SURGICAL LIGHT HANDLE (MISCELLANEOUS) ×3 IMPLANT
DRAIN CHANNEL 19F RND (DRAIN) IMPLANT
DRAPE C-ARM 42X120 X-RAY (DRAPES) ×3 IMPLANT
DRAPE WARM FLUID 44X44 (DRAPES) ×3 IMPLANT
DRSG TEGADERM 4X4.75 (GAUZE/BANDAGES/DRESSINGS) ×3 IMPLANT
ELECT REM PT RETURN 15FT ADLT (MISCELLANEOUS) ×3 IMPLANT
ENDOLOOP SUT PDS II  0 18 (SUTURE)
ENDOLOOP SUT PDS II 0 18 (SUTURE) IMPLANT
EVACUATOR SILICONE 100CC (DRAIN) IMPLANT
GAUZE SPONGE 2X2 8PLY STRL LF (GAUZE/BANDAGES/DRESSINGS) ×2 IMPLANT
GLOVE ECLIPSE 8.0 STRL XLNG CF (GLOVE) ×3 IMPLANT
GLOVE INDICATOR 8.0 STRL GRN (GLOVE) ×3 IMPLANT
GOWN STRL REUS W/ TWL XL LVL3 (GOWN DISPOSABLE) ×6 IMPLANT
GOWN STRL REUS W/TWL XL LVL3 (GOWN DISPOSABLE) ×6
IRRIG SUCT STRYKERFLOW 2 WTIP (MISCELLANEOUS) ×2
IRRIGATION SUCT STRKRFLW 2 WTP (MISCELLANEOUS) ×2 IMPLANT
KIT BASIN OR (CUSTOM PROCEDURE TRAY) ×3 IMPLANT
KIT TURNOVER KIT A (KITS) IMPLANT
NDL BIOPSY 14X6 SOFT TISS (NEEDLE) IMPLANT
NEEDLE BIOPSY 14X6 SOFT TISS (NEEDLE) ×2 IMPLANT
PAD POSITIONING PINK XL (MISCELLANEOUS) ×3 IMPLANT
PENCIL SMOKE EVACUATOR (MISCELLANEOUS) IMPLANT
POUCH RETRIEVAL ECOSAC 10 (ENDOMECHANICALS) IMPLANT
POUCH RETRIEVAL ECOSAC 10MM (ENDOMECHANICALS) ×2
PROTECTOR NERVE ULNAR (MISCELLANEOUS) IMPLANT
SCISSORS LAP 5X35 DISP (ENDOMECHANICALS) ×3 IMPLANT
SET CHOLANGIOGRAPH MIX (MISCELLANEOUS) ×3 IMPLANT
SET TUBE SMOKE EVAC HIGH FLOW (TUBING) ×3 IMPLANT
SHEARS HARMONIC ACE PLUS 36CM (ENDOMECHANICALS) ×3 IMPLANT
SPIKE FLUID TRANSFER (MISCELLANEOUS) ×3 IMPLANT
SPONGE GAUZE 2X2 STER 10/PKG (GAUZE/BANDAGES/DRESSINGS) ×1
SUT MNCRL AB 4-0 PS2 18 (SUTURE) ×3 IMPLANT
SUT PDS AB 1 CT1 27 (SUTURE) ×6 IMPLANT
SYR 20ML LL LF (SYRINGE) ×3 IMPLANT
TOWEL OR 17X26 10 PK STRL BLUE (TOWEL DISPOSABLE) ×3 IMPLANT
TOWEL OR NON WOVEN STRL DISP B (DISPOSABLE) ×3 IMPLANT
TRAY LAPAROSCOPIC (CUSTOM PROCEDURE TRAY) ×3 IMPLANT
TROCAR BLADELESS OPT 5 150 (ENDOMECHANICALS) ×3 IMPLANT
TROCAR Z-THREAD OPTICAL 5X100M (TROCAR) ×3 IMPLANT

## 2021-07-17 NOTE — Anesthesia Preprocedure Evaluation (Signed)
Anesthesia Evaluation  Patient identified by MRN, date of birth, ID band Patient awake    Reviewed: Allergy & Precautions, NPO status , Patient's Chart, lab work & pertinent test results  Airway Mallampati: II  TM Distance: >3 FB Neck ROM: Full    Dental no notable dental hx.    Pulmonary neg pulmonary ROS,    Pulmonary exam normal        Cardiovascular negative cardio ROS   Rhythm:Regular Rate:Normal     Neuro/Psych Depression Schizophrenia negative neurological ROS     GI/Hepatic Neg liver ROS, Symptomatic stones   Endo/Other  diabetes, Insulin DependentMorbid obesity  Renal/GU negative Renal ROS     Musculoskeletal   Abdominal Normal abdominal exam  (+)   Peds  Hematology Lab Results      Component                Value               Date                      WBC                      7.5                 07/16/2021                HGB                      13.5                07/16/2021                HCT                      39.0                07/16/2021                MCV                      84.8                07/16/2021                PLT                      263                 07/16/2021           Lab Results      Component                Value               Date                      NA                       138                 07/17/2021                K                        3.6  07/17/2021                CO2                      24                  07/17/2021                GLUCOSE                  115 (H)             07/17/2021                BUN                      5 (L)               07/17/2021                CREATININE               0.59                07/17/2021                CALCIUM                  8.0 (L)             07/17/2021                EGFR                     130                 08/06/2020                GFRNONAA                 >60                 07/17/2021              Anesthesia Other Findings   Reproductive/Obstetrics                             Anesthesia Physical  Anesthesia Plan  ASA: 3  Anesthesia Plan: General   Post-op Pain Management: Tylenol PO (pre-op)*   Induction: Intravenous  PONV Risk Score and Plan: 3 and Ondansetron, Dexamethasone and Scopolamine patch - Pre-op  Airway Management Planned: Oral ETT  Additional Equipment: None  Intra-op Plan:   Post-operative Plan: Extubation in OR  Informed Consent: I have reviewed the patients History and Physical, chart, labs and discussed the procedure including the risks, benefits and alternatives for the proposed anesthesia with the patient or authorized representative who has indicated his/her understanding and acceptance.     Dental advisory given  Plan Discussed with: CRNA  Anesthesia Plan Comments:         Anesthesia Quick Evaluation

## 2021-07-17 NOTE — Anesthesia Procedure Notes (Signed)
Procedure Name: Intubation Date/Time: 07/17/2021 8:49 AM  Performed by: Lavina Hamman, CRNAPre-anesthesia Checklist: Patient identified, Emergency Drugs available, Suction available, Patient being monitored and Timeout performed Patient Re-evaluated:Patient Re-evaluated prior to induction Oxygen Delivery Method: Circle system utilized Preoxygenation: Pre-oxygenation with 100% oxygen Induction Type: IV induction Ventilation: Mask ventilation without difficulty Laryngoscope Size: Mac and 3 Grade View: Grade I Tube type: Oral Tube size: 7.0 mm Number of attempts: 1 Airway Equipment and Method: Stylet Placement Confirmation: ETT inserted through vocal cords under direct vision, positive ETCO2, CO2 detector and breath sounds checked- equal and bilateral Secured at: 21 cm Tube secured with: Tape Dental Injury: Teeth and Oropharynx as per pre-operative assessment  Comments: ATOI

## 2021-07-17 NOTE — Interval H&P Note (Signed)
History and Physical Interval Note:  07/17/2021 7:45 AM  Melissa Gilmore  has presented today for surgery, with the diagnosis of SYMPTOMATIC CHOLECYSTITIS.  The various methods of treatment have been discussed with the patient and family. After consideration of risks, benefits and other options for treatment, the patient has consented to  Procedure(s): LAPAROSCOPIC CHOLECYSTECTOMY WITH INTRAOPERATIVE CHOLANGIOGRAM (N/A) as a surgical intervention.  The patient's history has been reviewed, patient examined, no change in status, stable for surgery.  I have reviewed the patient's chart and labs.  Questions were answered to the patient's satisfaction.    I have re-reviewed the the patient's records, history, medications, and allergies.  I have re-examined the patient.  I again discussed intraoperative plans and goals of post-operative recovery.  The patient agrees to proceed.  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[place])    Schizophrenia ([place])     Past Surgical History:  Procedure Laterality Date   NO PAST SURGERIES      Social History   Socioeconomic History   Marital status: Single    Spouse name: Not on file   Number of children: Not on file   Years of education: Not on file   Highest education level: Not on file  Occupational History   Not on file  Tobacco Use   Smoking status: Never   Smokeless tobacco: Never  Substance and Sexual Activity   Alcohol use: Not Currently   Drug use: Not Currently   Sexual activity: Not Currently  Other Topics Concern   Not on file  Social History Narrative   Not on file   Social Determinants of Health   Financial Resource Strain: Not on file   Food Insecurity: Not on file  Transportation Needs: Not on file  Physical Activity: Not on file  Stress: Not on file  Social Connections: Not on file  Intimate Partner Violence: Not on file    Family History  Problem Relation Age of Onset   Diabetes Maternal Grandmother    Stomach cancer Maternal Grandmother     Medications Prior to Admission  Medication Sig Dispense Refill Last Dose   acetaminophen (TYLENOL) 325 MG tablet Take 650 mg by mouth every 6 (six) hours as needed for mild pain.   07/15/2021   insulin glargine (LANTUS SOLOSTAR) 100 UNIT/ML Solostar Pen Inject 20 Units into the skin daily. 18 mL 1 Past Week   liraglutide (VICTOZA) 18 MG/3ML SOPN Inject 1.8 mg into the skin daily. 9 mL 2 Past Week   Norgestimate-Ethinyl Estradiol Triphasic (TRI-SPRINTEC) 0.18/0.215/0.25 MG-35 MCG tablet Take 1 tablet by mouth daily. 84 tablet 3 07/15/2021   Accu-Chek Softclix Lancets lancets Use to check blood sugar twice daily. Dx E11.65 100 each 3    Blood Glucose Monitoring Suppl (ACCU-CHEK GUIDE ME) w/Device KIT Use to check blood sugar twice daily. Dx E11.65 1 kit 0    cephALEXin (KEFLEX) 500 MG capsule Take 1 capsule (500 mg total) by mouth 2 (two) times daily. (Patient not taking: Reported on 07/16/2021) 14 capsule 0 Not Taking   cetirizine (ZYRTEC) 10 MG tablet Take 1 tablet (10 mg total) by mouth daily. (Patient not taking: Reported on 07/16/2021) 30 tablet 0 Not Taking  fluticasone (FLONASE) 50 MCG/ACT nasal spray Place 1 spray into both nostrils daily for 3 days. (Patient not taking: Reported on 07/16/2021) 16 g 0 Not Taking   glucose blood (ACCU-CHEK GUIDE) test strip Use to check blood sugar twice daily. Dx E11.65 100 each 12    HYDROcodone-acetaminophen (NORCO/VICODIN) 5-325 MG tablet Take 1-2 tablets by mouth every 6 (six) hours as needed. (Patient not taking: Reported on 07/16/2021) 10 tablet 0 Not Taking   Insulin Pen Needle (B-D UF III MINI PEN NEEDLES) 31G X 5 MM MISC Use as  instructed. Inject into the skin once daily 100 each 3    naproxen (NAPROSYN) 500 MG tablet Take 1 tablet (500 mg total) by mouth 2 (two) times daily as needed for moderate pain. (Patient not taking: Reported on 07/16/2021) 15 tablet 0 Not Taking   ondansetron (ZOFRAN-ODT) 4 MG disintegrating tablet Take 1 tablet (4 mg total) by mouth every 8 (eight) hours as needed for nausea or vomiting. (Patient not taking: Reported on 07/16/2021) 10 tablet 0 Not Taking    Current Facility-Administered Medications  Medication Dose Route Frequency Provider Last Rate Last Admin   0.9 % NaCl with KCl 20 mEq/ L  infusion   Intravenous Continuous Greer Pickerel, MD 50 mL/hr at 07/16/21 1630 New Bag at 07/16/21 1630   acetaminophen (TYLENOL) tablet 650 mg  650 mg Oral Q6H PRN Greer Pickerel, MD   650 mg at 07/16/21 2100   Or   acetaminophen (TYLENOL) suppository 650 mg  650 mg Rectal Q6H PRN Greer Pickerel, MD       cefTRIAXone (ROCEPHIN) 2 g in sodium chloride 0.9 % 100 mL IVPB  2 g Intravenous Q24H Greer Pickerel, MD 200 mL/hr at 07/16/21 1632 2 g at 07/16/21 1632   diphenhydrAMINE (BENADRYL) 12.5 MG/5ML elixir 12.5 mg  12.5 mg Oral Q6H PRN Greer Pickerel, MD       Or   diphenhydrAMINE (BENADRYL) injection 12.5 mg  12.5 mg Intravenous Q6H PRN Greer Pickerel, MD       docusate sodium (COLACE) capsule 100 mg  100 mg Oral BID Greer Pickerel, MD   100 mg at 07/16/21 1653   enoxaparin (LOVENOX) injection 50 mg  50 mg Subcutaneous Q24H Greer Pickerel, MD   50 mg at 07/16/21 1653   insulin aspart (novoLOG) injection 0-20 Units  0-20 Units Subcutaneous TID WC Greer Pickerel, MD   3 Units at 07/16/21 1740   morphine (PF) 2 MG/ML injection 1-2 mg  1-2 mg Intravenous Q2H PRN Greer Pickerel, MD   2 mg at 07/17/21 0605   ondansetron (ZOFRAN-ODT) disintegrating tablet 4 mg  4 mg Oral Q6H PRN Greer Pickerel, MD       Or   ondansetron [facility]) injection 4 mg  4 mg Intravenous Q6H PRN Greer Pickerel, MD   4 mg at 07/16/21 2326   oxyCODONE (Oxy  IR/ROXICODONE) immediate release tablet 5-10 mg  5-10 mg Oral Q4H PRN Greer Pickerel, MD   10 mg at 07/16/21 2100   simethicone (MYLICON) chewable tablet 40 mg  40 mg Oral Q6H PRN Greer Pickerel, MD         No Known Allergies  BP (!) 148/99 (BP Location: Left Arm)   Pulse 79   Temp 98.7 F (37.1 C) (Oral)   Resp 18   Ht '5\' 4"'  (1.626 m)   Wt 105.4 kg   LMP 06/21/2021 (Exact Date)   SpO2 100%   BMI 39.89 kg/m   Labs: Results for orders  placed or performed during the hospital encounter of 07/16/21 (from the past 48 hour(s))  CBG monitoring, ED     Status: Abnormal   Collection Time: 07/16/21  4:47 AM  Result Value Ref Range   Glucose-Capillary 252 (H) 70 - 99 mg/dL    Comment: Glucose reference range applies only to samples taken after fasting for at least 8 hours.  Lipase, blood     Status: Abnormal   Collection Time: 07/16/21  4:56 AM  Result Value Ref Range   Lipase 128 (H) 11 - 51 U/L    Comment: Performed at [facility], [place] [address], [place], [place] [address]  Comprehensive metabolic panel     Status: Abnormal   Collection Time: 07/16/21  4:56 AM  Result Value Ref Range   Sodium 134 (L) 135 - 145 mmol/L   Potassium 3.8 3.5 - 5.1 mmol/L   Chloride 105 98 - 111 mmol/L   CO2 22 22 - 32 mmol/L   Glucose, Bld 256 (H) 70 - 99 mg/dL    Comment: Glucose reference range applies only to samples taken after fasting for at least 8 hours.   BUN 6 6 - 20 mg/dL   Creatinine, Ser 0.68 0.44 - 1.00 mg/dL   Calcium 8.7 (L) 8.9 - 10.3 mg/dL   Total Protein 6.7 6.5 - 8.1 g/dL   Albumin 3.4 (L) 3.5 - 5.0 g/dL   AST 26 15 - 41 U/L   ALT 21 0 - 44 U/L   Alkaline Phosphatase 81 38 - 126 U/L   Total Bilirubin 0.3 0.3 - 1.2 mg/dL    Comment: REPEATED TO VERIFY   GFR, Estimated >60 >60 mL/min    Comment: (NOTE) Calculated using the CKD-EPI Creatinine Equation (2021)    Anion gap 7 5 - 15    Comment: Performed at [place] [address], [place], [place] [address]  CBC      Status: None   Collection Time: 07/16/21  4:56 AM  Result Value Ref Range   WBC 7.5 4.0 - 10.5 K/uL   RBC 4.60 3.87 - 5.11 MIL/uL   Hemoglobin 13.5 12.0 - 15.0 g/dL   HCT 39.0 36.0 - 46.0 %   MCV 84.8 80.0 - 100.0 fL   MCH 29.3 26.0 - 34.0 pg   MCHC 34.6 30.0 - 36.0 g/dL   RDW 12.5 11.5 - 15.5 %   Platelets 263 150 - 400 K/uL   nRBC 0.0 0.0 - 0.2 %    Comment: Performed at [facility], [place] [address], [place], [place] [address]  Urinalysis, Routine w reflex microscopic Urine, Clean Catch     Status: Abnormal   Collection Time: 07/16/21  5:15 AM  Result Value Ref Range   Color, Urine STRAW (A) YELLOW   APPearance CLEAR CLEAR   Specific Gravity, Urine 1.022 1.005 - 1.030   pH 8.0 5.0 - 8.0   Glucose, UA >=500 (A) NEGATIVE mg/dL   Hgb urine dipstick NEGATIVE NEGATIVE   Bilirubin Urine NEGATIVE NEGATIVE   Ketones, ur NEGATIVE NEGATIVE mg/dL   Protein, ur NEGATIVE NEGATIVE mg/dL   Nitrite NEGATIVE NEGATIVE   Leukocytes,Ua NEGATIVE NEGATIVE   RBC / HPF 0-5 0 - 5 RBC/hpf   WBC, UA 0-5 0 - 5 WBC/hpf   Bacteria, UA RARE (A) NONE SEEN   Squamous Epithelial / LPF 0-5 0 - 5    Comment: Performed at [facility], [address],  [place] [address]  I-Stat beta hCG blood, ED     Status: None   Collection Time: 07/16/21  5:32 AM  Result Value Ref Range   I-stat hCG, quantitative <5.0 <5 mIU/mL   Comment 3            Comment:   GEST. AGE      CONC.  (mIU/mL)   <=1 WEEK        5 - 50     2 WEEKS       50 - 500     3 WEEKS       100 - 10,000     4 WEEKS     1,000 - 30,000        FEMALE AND NON-PREGNANT FEMALE:     LESS THAN 5 mIU/mL   CBG monitoring, ED     Status: Abnormal   Collection Time: 07/16/21  2:19 PM  Result Value Ref Range   Glucose-Capillary 118 (H) 70 - 99 mg/dL    Comment: Glucose reference range applies only to samples taken after fasting for at least 8 hours.  Glucose, capillary     Status: Abnormal   Collection Time: 07/16/21  4:56 PM  Result  Value Ref Range   Glucose-Capillary 141 (H) 70 - 99 mg/dL    Comment: Glucose reference range applies only to samples taken after fasting for at least 8 hours.  Glucose, capillary     Status: Abnormal   Collection Time: 07/16/21  9:08 PM  Result Value Ref Range   Glucose-Capillary 160 (H) 70 - 99 mg/dL    Comment: Glucose reference range applies only to samples taken after fasting for at least 8 hours.  Comprehensive metabolic panel     Status: Abnormal   Collection Time: 07/17/21  5:05 AM  Result Value Ref Range   Sodium 138 135 - 145 mmol/L   Potassium 3.6 3.5 - 5.1 mmol/L   Chloride 107 98 - 111 mmol/L   CO2 24 22 - 32 mmol/L   Glucose, Bld 115 (H) 70 - 99 mg/dL    Comment: Glucose reference range applies only to samples taken after fasting for at least 8 hours.   BUN 5 (L) 6 - 20 mg/dL   Creatinine, Ser 0.59 0.44 - 1.00 mg/dL   Calcium 8.0 (L) 8.9 - 10.3 mg/dL   Total Protein 6.3 (L) 6.5 - 8.1 g/dL   Albumin 3.0 (L) 3.5 - 5.0 g/dL   AST 36 15 - 41 U/L   ALT 23 0 - 44 U/L   Alkaline Phosphatase 47 38 - 126 U/L   Total Bilirubin 0.4 0.3 - 1.2 mg/dL   GFR, Estimated >60 >60 mL/min    Comment: (NOTE) Calculated using the CKD-EPI Creatinine Equation (2021)    Anion gap 7 5 - 15    Comment: Performed at [facility], [place] [address], [place], [place] [address]  Lipase, blood     Status: None   Collection Time: 07/17/21  5:05 AM  Result Value Ref Range   Lipase 31 11 - 51 U/L    Comment: Performed at [facility], [place] [address], [place], [place] [address]    Imaging / Studies: CT ABDOMEN PELVIS W CONTRAST  Addendum Date: 07/16/2021   ADDENDUM REPORT: 07/16/2021 10:51 ADDENDUM: 4 mm right lower lobe nodule. In this age group, this can be considered benign with no follow-up recommended. Electronically Signed   By: Dedra Skeens.D.  On: 07/16/2021 10:51   Result Date: 07/16/2021 CLINICAL DATA:  Epigastric pain. EXAM: CT ABDOMEN AND PELVIS  WITH CONTRAST TECHNIQUE: Multidetector CT imaging of the abdomen and pelvis was performed using the standard protocol following bolus administration of intravenous contrast. RADIATION DOSE REDUCTION: This exam was performed according to the departmental dose-optimization program which includes automated exposure control, adjustment of the mA and/or kV according to patient size and/or use of iterative reconstruction technique. CONTRAST:  145m OMNIPAQUE IOHEXOL 300 MG/ML  SOLN COMPARISON:  Current right upper quadrant ultrasound. FINDINGS: Lower chest: No acute findings. 4 mm nodule, right lower lobe, image 29, series 5. Hepatobiliary: Decreased liver attenuation consistent with fatty infiltration. No liver mass or focal lesion. Normal gallbladder. Stones noted on ultrasound are not defined on CT. No bile duct dilation. Pancreas: Unremarkable. No pancreatic ductal dilatation or surrounding inflammatory changes. Spleen: Normal in size without focal abnormality. Adrenals/Urinary Tract: Adrenal glands are unremarkable. Kidneys are normal, without renal calculi, focal lesion, or hydronephrosis. Bladder is unremarkable. Stomach/Bowel: Stomach is within normal limits. Appendix appears normal. No evidence of bowel wall thickening, distention, or inflammatory changes. Vascular/Lymphatic: No significant vascular findings are present. No enlarged abdominal or pelvic lymph nodes. Reproductive: Uterus and bilateral adnexa are unremarkable. Other: No abdominal wall hernia or abnormality. No abdominopelvic ascites. Musculoskeletal: No acute or significant osseous findings. IMPRESSION: 1. No acute findings within the abdomen or pelvis. No findings to account for epigastric pain. 2. Gallstones noted on the current ultrasound are insufficiently radiopaque to be visualized by CT. 3. Hepatic steatosis. Electronically Signed: By: DLajean ManesM.D. On: 07/16/2021 10:32   UKoreaAbdomen Limited RUQ (LIVER/GB)  Result Date:  07/16/2021 CLINICAL DATA:  Right upper quadrant pain EXAM: ULTRASOUND ABDOMEN LIMITED RIGHT UPPER QUADRANT COMPARISON:  Ultrasound 07/15/2021 FINDINGS: Gallbladder: Cholelithiasis with multiple intraluminal stones largest measuring up to 0.9 cm. The gallbladder is nondilated. No wall thickening or pericholecystic fluid. There is a reportedly positive sonographic Murphy sign, however the gallbladder is nondilated and thus this is nonspecific. Common bile duct: Diameter: 4.8 mm Liver: Diffusely increased liver echogenicity. Portal vein is patent on color Doppler imaging with normal direction of blood flow towards the liver. Other: None. IMPRESSION: Cholelithiasis with multiple intraluminal stones largest measuring up to 0.9 cm. No evidence of cholecystitis. Reportedly positive sonographic Murphy's is nonspecific in the setting of a nondilated gallbladder. Diffuse hepatic steatosis. Electronically Signed   By: JMaurine SimmeringM.D.   On: 07/16/2021 10:17   [place]oreaAbdomen Limited  Result Date: 07/15/2021 CLINICAL DATA:  Right upper quadrant pain EXAM: ULTRASOUND ABDOMEN LIMITED RIGHT UPPER QUADRANT COMPARISON:  None Available. FINDINGS: Gallbladder: Well distended with gallstones within. Negative sonographic Murphy's sign is elicited. Common bile duct: Diameter: 4 mm. Liver: Diffusely increased in echogenicity consistent with fatty infiltration. No focal mass is noted. Portal vein is patent on color Doppler imaging with normal direction of blood flow towards the liver. Other: None. IMPRESSION: Cholelithiasis without complicating factors. Fatty liver. No other focal abnormality is noted. Electronically Signed   By: MInez CatalinaM.D.   On: 07/15/2021 01:22     .SAdin Hector M.D., F.A.C.S. [facility] [address] [place] [phone] / Paging  07/17/2021 7:45 AM    SAdin Hector

## 2021-07-17 NOTE — Transfer of Care (Signed)
Immediate Anesthesia Transfer of Care Note  Patient: Melissa Gilmore  Procedure(s) Performed: Procedure(s): SINGLE SITE LAPAROSCOPIC CHOLECYSTECTOMY WITH INTRAOPERATIVE CHOLANGIOGRAM (N/A)  Patient Location: PACU  Anesthesia Type:General  Level of Consciousness:  sedated, patient cooperative and responds to stimulation  Airway & Oxygen Therapy:Patient Spontanous Breathing and Patient connected to face mask oxgen  Post-op Assessment:  Report given to PACU RN and Post -op Vital signs reviewed and stable  Post vital signs:  Reviewed and stable  Last Vitals:  Vitals:   07/17/21 0547 07/17/21 0951  BP: (!) 148/99 129/76  Pulse: 79 83  Resp: 18 18  Temp: 37.1 C 37 C  SpO2: 100% 95%    Complications: No apparent anesthesia complications

## 2021-07-17 NOTE — Discharge Instructions (Signed)
################################################################  LAPAROSCOPIC SURGERY: POST OP INSTRUCTIONS  ######################################################################  EAT Gradually transition to a high fiber diet with a fiber supplement over the next few weeks after discharge.  Start with a pureed / full liquid diet (see below)  WALK Walk an hour a day.  Control your pain to do that.    CONTROL PAIN Control pain so that you can walk, sleep, tolerate sneezing/coughing, go up/down stairs.  HAVE A BOWEL MOVEMENT DAILY Keep your bowels regular to avoid problems.  OK to try a laxative to override constipation.  OK to use an antidairrheal to slow down diarrhea.  Call if not better after 2 tries  CALL IF YOU HAVE PROBLEMS/CONCERNS Call if you are still struggling despite following these instructions. Call if you have concerns not answered by these instructions  ######################################################################    DIET: Follow a light bland diet & liquids the first 24 hours after arrival home, such as soup, liquids, starches, etc.  Be sure to drink plenty of fluids.  Quickly advance to a usual solid diet within a few days.  Avoid fast food or heavy meals as your are more likely to get nauseated or have irregular bowels.  A low-fat, high-fiber diet for the rest of your life is ideal.  Take your usually prescribed home medications unless otherwise directed.  PAIN CONTROL: Pain is best controlled by a usual combination of three different methods TOGETHER: Ice/Heat Over the counter pain medication Prescription pain medication Most patients will experience some swelling and bruising around the incisions.  Ice packs or heating pads (30-60 minutes up to 6 times a day) will help. Use ice for the first few days to help decrease swelling and bruising, then switch to heat to help relax tight/sore spots and speed recovery.  Some people prefer to use ice alone, heat  alone, alternating between ice & heat.  Experiment to what works for you.  Swelling and bruising can take several weeks to resolve.   It is helpful to take an over-the-counter pain medication regularly for the first few weeks.  Choose one of the following that works best for you: Naproxen (Aleve, etc)  Two 220mg tabs twice a day Ibuprofen (Advil, etc) Three 200mg tabs four times a day (every meal & bedtime) Acetaminophen (Tylenol, etc) 500-650mg four times a day (every meal & bedtime) A  prescription for pain medication (such as oxycodone, hydrocodone, tramadol, gabapentin, methocarbamol, etc) should be given to you upon discharge.  Take your pain medication as prescribed.  If you are having problems/concerns with the prescription medicine (does not control pain, nausea, vomiting, rash, itching, etc), please call us [phone] to see if we need to switch you to a different pain medicine that will work better for you and/or control your side effect better. If you need a refill on your pain medication, please give us 48 hour notice.  contact your pharmacy.  They will contact our office to request authorization. Prescriptions will not be filled after 5 pm or on week-ends  Avoid getting constipated.   Between the surgery and the pain medications, it is common to experience some constipation.   Increasing fluid intake and taking a fiber supplement (such as Metamucil, Citrucel, FiberCon, MiraLax, etc) 1-2 times a day regularly will usually help prevent this problem from occurring.   A mild laxative (prune juice, Milk of Magnesia, MiraLax, etc) should be taken according to package directions if there are no bowel movements after 48 hours.   Watch out for diarrhea.     If you have many loose bowel movements, simplify your diet to bland foods & liquids for a few days.   Stop any stool softeners and decrease your fiber supplement.   Switching to mild anti-diarrheal medications (Kayopectate, Pepto Bismol) can  help.   If this worsens or does not improve, please call us.  Wash / shower every day.  You may shower over the dressings as they are waterproof.  Continue to shower over incision(s) after the dressing is off.  It is good for closed incisions and even open wounds to be washed every day.  Shower every day.  Short baths are fine.  Wash the incisions and wounds clean with soap & water.    You may leave closed incisions open to air if it is dry.   You may cover the incision with clean gauze & replace it after your daily shower for comfort.  TEGADERM:  You have clear gauze band-aid dressings over your closed incision(s).  Remove the dressings 3 days after surgery.    ACTIVITIES as tolerated:   You may resume regular (light) daily activities beginning the next day--such as daily self-care, walking, climbing stairs--gradually increasing activities as tolerated.  If you can walk 30 minutes without difficulty, it is safe to try more intense activity such as jogging, treadmill, bicycling, low-impact aerobics, swimming, etc. Save the most intensive and strenuous activity for last such as sit-ups, heavy lifting, contact sports, etc  Refrain from any heavy lifting or straining until you are off narcotics for pain control.   DO NOT PUSH THROUGH PAIN.  Let pain be your guide: If it hurts to do something, don't do it.  Pain is your body warning you to avoid that activity for another week until the pain goes down. You may drive when you are no longer taking prescription pain medication, you can comfortably wear a seatbelt, and you can safely maneuver your car and apply brakes. You may have sexual intercourse when it is comfortable.  FOLLOW UP in our office Please call [facility] at [phone] to set up an appointment to see your surgeon in the office for a follow-up appointment approximately 2-3 weeks after your surgery. Make sure that you call for this appointment the day you arrive home to insure a convenient  appointment time.  10. IF YOU HAVE DISABILITY OR FAMILY LEAVE FORMS, BRING THEM TO THE OFFICE FOR PROCESSING.  DO NOT GIVE THEM TO YOUR DOCTOR.   WHEN TO CALL US [phone]: Poor pain control Reactions / problems with new medications (rash/itching, nausea, etc)  Fever over 101.5 F (38.5 C) Inability to urinate Nausea and/or vomiting Worsening swelling or bruising Continued bleeding from incision. Increased pain, redness, or drainage from the incision   The clinic staff is available to answer your questions during regular business hours (8:30am-5pm).  Please don't hesitate to call and ask to speak to one of our nurses for clinical concerns.   If you have a medical emergency, go to the nearest emergency room or call 911.  A surgeon from [facility] is always on call at the hospitals   [facility] [address], [address], [place], [place]  [address] ? MAIN: [phone] ? TOLL FREE: [phone] ?  FAX [phone] [email or web address]  ##############################################################  

## 2021-07-17 NOTE — Op Note (Signed)
07/17/2021  PATIENT:  Melissa Gilmore  26 y.o. female  Patient Care Team: Pcp, No as PCP - General  PRE-OPERATIVE DIAGNOSIS:    Acute on Chronic Calculus Cholecystitis  POST-OPERATIVE DIAGNOSIS:   Acute on Chronic Calculus Cholecystitis Fatty steatohepatitis  PROCEDURE:  Core Liver Biopsy (CPT code 31517) & SINGLE SITE Laparoscopic cholecystectomy with intraoperative cholangiogram (CPT code 61607)  SURGEON:  Ardeth Sportsman, MD, FACS.  ASSISTANT: RNFA   ANESTHESIA:    General with endotracheal intubation Local anesthetic as a field block  EBL:  (See Anesthesia Intraoperative Record) Total I/O In: 700 [I.V.:700] Out: -   Delay start of Pharmacological VTE agent (>24hrs) due to surgical blood loss or risk of bleeding:  no  DRAINS: None   SPECIMEN: Gallbladder & Core liver biopsies    DISPOSITION OF SPECIMEN:  PATHOLOGY  COUNTS:  YES  PLAN OF CARE: Admit for overnight observation  PATIENT DISPOSITION:  PACU - hemodynamically stable.  INDICATION: Pleasant woman with episode of upper abdominal pain.  Story suspicious for biliary colic.  Persistent.  Patient concerns or condition made for cholecystectomy.  The anatomy & physiology of hepatobiliary & pancreatic function was discussed.  The pathophysiology of gallbladder dysfunction was discussed.  Natural history risks without surgery was discussed.   I feel the risks of no intervention will lead to serious problems that outweigh the operative risks; therefore, I recommended cholecystectomy to remove the pathology.  I explained laparoscopic techniques with possible need for an open approach.  Probable cholangiogram to evaluate the bilary tract was explained as well.    Risks such as bleeding, infection, abscess, leak, injury to other organs, need for further treatment, heart attack, death, and other risks were discussed.  I noted a good likelihood this will help address the problem.  Possibility that this will not  correct all abdominal symptoms was explained.  Goals of post-operative recovery were discussed as well.  We will work to minimize complications.  An educational handout further explaining the pathology and treatment options was given as well.  Questions were answered.  The patient expresses understanding & wishes to proceed with surgery.  OR FINDINGS: Some early gallbladder wall edema suspicious for early acute cholecystitis.  Numerous small stones.  Narrowed cystic duct with some stricturing at the infundibular/cystic junction.  However no strong evidence of choledocholithiasis.  Normal biliary system and pancreatic duct.  No leak or obstruction.  Liver: Probable fatty change strongly suspected.  Core liver biopsies done  DESCRIPTION:   The patient was identified & brought in the operating room. The patient was positioned supine with arms tucked. SCDs were active during the entire case. The patient underwent general anesthesia without any difficulty.  The abdomen was prepped and draped in a sterile fashion. A Surgical Timeout confirmed our plan.  I made a transverse curvilinear incision through the superior umbilical fold.  I placed a 22mm long port through the supraumbilical fascia using a modified Hassan cutdown technique with umbilical stalk fascial countertraction. I began carbon dioxide insufflation.  No change in end tidal CO2 measurement.   Camera inspection revealed no injury. There were no adhesions to the anterior abdominal wall supraumbilically.  I proceeded to continue with single site technique. I placed a #5 port in left upper aspect of the wound. I placed a 5 mm atraumatic grasper in the right inferior aspect of the wound.  I turned attention to the right upper quadrant.  Operative findings as noted above.  The gallbladder fundus was elevated  cephalad. I freed adhesions to the ventral surface of the gallbladder off carefully.  I freed the peritoneal coverings between the gallbladder and  the liver on the posteriolateral and anteriomedial walls. I alternated between Harmonic & blunt Maryland dissection to help get a good critical view of the cystic artery and cystic duct.  did further dissection to free 80%of the gallbladder off the liver bed to get a good critical view of the infundibulum and cystic duct. I dissected out the cystic artery; and, after getting a good 360 view, ligated the anterior & posterior branches of the cystic artery close on the infundibulum using the Harmonic ultrasonic dissection.  I skeletonized the cystic duct.  I placed a clip on the infundibulum. I did a partial cystic duct-otomy and ensured patency. I placed a 5 [place] cholangiocatheter through a puncture site at the right subcostal ridge of the abdominal wall and directed it into the cystic duct.  We ran a cholangiogram with dilute radio-opaque contrast and continuous fluoroscopy. Contrast flowed from a side branch consistent with cystic duct cannulization. Contrast flowed up the common hepatic duct into the right and left intrahepatic chains out to secondary radicals. Contrast flowed down the common bile duct easily across the normal ampulla into the duodenum.  This was consistent with a normal cholangiogram.  I removed the cholangiocatheter. I placed clips on the cystic duct x4.  I completed cystic duct transection. I freed the gallbladder from its remaining attachments to the liver. I ensured hemostasis on the gallbladder fossa of the liver and elsewhere.  Given the fact that she had a thickened bulky liver with probable steatohepatitis, decided proceed with liver biopsy.  Did 14-gauge Tru-Cut needle biopsies of the liver.  I did 5 passes and got 3 excellent cores.  Hemostasis ensured.  I inspected the rest of the abdomen & detected no injury nor bleeding elsewhere.  I placed the gallbladder inside and EcoSac and removed the gallbladder out the supraumbilical fascia. I closed the fascia transversely using  #1  PDS interrupted stitches. I closed the skin using 4-0 monocryl stitch.  Sterile dressing was applied. The patient was extubated & arrived in the PACU in stable condition..  I had discussed postoperative care with the patient in the holding area. I discussed operative findings, updated the patient's status, discussed probable steps to recovery, and gave postoperative recommendations to the  patient's mother .  Recommendations were made.  Questions were answered.  She expressed understanding & appreciation.  Ardeth Sportsman, M.D., F.A.C.S. Gastrointestinal and Minimally Invasive Surgery [facility] [address] [place], [place] [address] [phone] Main / Paging  07/17/2021 9:57 AM

## 2021-07-18 ENCOUNTER — Encounter (HOSPITAL_COMMUNITY): Payer: Self-pay | Admitting: Surgery

## 2021-07-18 DIAGNOSIS — K8012 Calculus of gallbladder with acute and chronic cholecystitis without obstruction: Secondary | ICD-10-CM | POA: Diagnosis not present

## 2021-07-18 LAB — GLUCOSE, CAPILLARY
Glucose-Capillary: 195 mg/dL — ABNORMAL HIGH (ref 70–99)
Glucose-Capillary: 279 mg/dL — ABNORMAL HIGH (ref 70–99)

## 2021-07-18 NOTE — Discharge Summary (Signed)
[facility] Discharge Summary   Patient ID: Makai Agostinelli MRN: [id number] DOB/AGE: Aug 12, 1995 26 y.o.  Admit date: 07/16/2021 Discharge date: 07/18/2021  Admitting Diagnosis: Acute cholecystitis   Discharge Diagnosis Acute cholecystitis s/p laparoscopic cholecystectomy   Consultants None   Imaging: DG Cholangiogram Operative  Result Date: 07/17/2021 CLINICAL DATA:  Status post cholecystectomy. EXAM: INTRAOPERATIVE CHOLANGIOGRAM TECHNIQUE: Cholangiographic images from the C-arm fluoroscopic device were submitted for interpretation post-operatively. Please see the procedural report for the amount of contrast and the fluoroscopy time utilized. FLUOROSCOPY: Radiation Exposure Index (as provided by the fluoroscopic device): 5.15 mGy Kerma COMPARISON:  CT AP 07/16/2021 FINDINGS: Postoperative change from cholecystectomy. The common bile duct appears patent with contrast flowing into the common bile duct with reflux into the main pancreatic duct and drainage into the duodenum. No suspicious filling defects identified within the common bile duct. No signs of contrast extravasation. IMPRESSION: 1. Patent common bile duct without suspicious intraluminal filling defects. 2. No extravasation of contrast material identified to suggest bowel leakage. Electronically Signed   By: Kerby Moors M.D.   On: 07/17/2021 10:48   CT ABDOMEN PELVIS W CONTRAST  Addendum Date: 07/16/2021   ADDENDUM REPORT: 07/16/2021 10:51 ADDENDUM: 4 mm right lower lobe nodule. In this age group, this can be considered benign with no follow-up recommended. Electronically Signed   By: Lajean Manes M.D.   On: 07/16/2021 10:51   Result Date: 07/16/2021 CLINICAL DATA:  Epigastric pain. EXAM: CT ABDOMEN AND PELVIS WITH CONTRAST TECHNIQUE: Multidetector CT imaging of the abdomen and pelvis was performed using the standard protocol following bolus administration of intravenous contrast. RADIATION DOSE REDUCTION:  This exam was performed according to the departmental dose-optimization program which includes automated exposure control, adjustment of the mA and/or kV according to patient size and/or use of iterative reconstruction technique. CONTRAST:  180m OMNIPAQUE IOHEXOL 300 MG/ML  SOLN COMPARISON:  Current right upper quadrant ultrasound. FINDINGS: Lower chest: No acute findings. 4 mm nodule, right lower lobe, image 29, series 5. Hepatobiliary: Decreased liver attenuation consistent with fatty infiltration. No liver mass or focal lesion. Normal gallbladder. Stones noted on ultrasound are not defined on CT. No bile duct dilation. Pancreas: Unremarkable. No pancreatic ductal dilatation or surrounding inflammatory changes. Spleen: Normal in size without focal abnormality. Adrenals/Urinary Tract: Adrenal glands are unremarkable. Kidneys are normal, without renal calculi, focal lesion, or hydronephrosis. Bladder is unremarkable. Stomach/Bowel: Stomach is within normal limits. Appendix appears normal. No evidence of bowel wall thickening, distention, or inflammatory changes. Vascular/Lymphatic: No significant vascular findings are present. No enlarged abdominal or pelvic lymph nodes. Reproductive: Uterus and bilateral adnexa are unremarkable. Other: No abdominal wall hernia or abnormality. No abdominopelvic ascites. Musculoskeletal: No acute or significant osseous findings. IMPRESSION: 1. No acute findings within the abdomen or pelvis. No findings to account for epigastric pain. 2. Gallstones noted on the current ultrasound are insufficiently radiopaque to be visualized by CT. 3. Hepatic steatosis. Electronically Signed: By: DLajean ManesM.D. On: 07/16/2021 10:32   UKoreaAbdomen Limited RUQ (LIVER/GB)  Result Date: 07/16/2021 CLINICAL DATA:  Right upper quadrant pain EXAM: ULTRASOUND ABDOMEN LIMITED RIGHT UPPER QUADRANT COMPARISON:  Ultrasound 07/15/2021 FINDINGS: Gallbladder: Cholelithiasis with multiple intraluminal stones  largest measuring up to 0.9 cm. The gallbladder is nondilated. No wall thickening or pericholecystic fluid. There is a reportedly positive sonographic Murphy sign, however the gallbladder is nondilated and thus this is nonspecific. Common bile duct: Diameter: 4.8 mm Liver: Diffusely increased liver echogenicity. Portal vein is patent on  color Doppler imaging with normal direction of blood flow towards the liver. Other: None. IMPRESSION: Cholelithiasis with multiple intraluminal stones largest measuring up to 0.9 cm. No evidence of cholecystitis. Reportedly positive sonographic Murphy's is nonspecific in the setting of a nondilated gallbladder. Diffuse hepatic steatosis. Electronically Signed   By: Maurine Simmering M.D.   On: 07/16/2021 10:17    Procedures Dr. Michael Boston (07/17/21) - Single Site Laparoscopic Cholecystectomy with IOC, Core liver biopsy  Hospital Course:  Patient is a 26 year old female who presented to the ED with abdominal pain.  Workup showed acute cholecystitis.  Patient was admitted and underwent procedure listed above.  Tolerated procedure well and was transferred to the floor.  Diet was advanced as tolerated.  On POD1, the patient was voiding well, tolerating diet, ambulating well, pain well controlled, vital signs stable, incisions c/d/i and felt stable for discharge home.  Patient will follow up in our office in 3-4 weeks and knows to call with questions or concerns.  She will call to confirm appointment date/time.    Physical Exam: General:  Alert, NAD, pleasant, comfortable Abd:  Soft, ND, mild tenderness, incision C/D/I  I or a member of my team have reviewed this patient in the Controlled Substance Database.   Allergies as of 07/18/2021   No Known Allergies      Medication List     STOP taking these medications    cephALEXin 500 MG capsule Commonly known as: KEFLEX   HYDROcodone-acetaminophen 5-325 MG tablet Commonly known as: NORCO/VICODIN   naproxen 500 MG  tablet Commonly known as: NAPROSYN       TAKE these medications    Accu-Chek Guide Me w/Device Kit Use to check blood sugar twice daily. Dx E11.65   Accu-Chek Guide test strip Generic drug: glucose blood Use to check blood sugar twice daily. Dx E11.65   Accu-Chek Softclix Lancets lancets Use to check blood sugar twice daily. Dx E11.65   acetaminophen 325 MG tablet Commonly known as: TYLENOL Take 650 mg by mouth every 6 (six) hours as needed for mild pain.   B-D UF III MINI PEN NEEDLES 31G X 5 MM Misc Generic drug: Insulin Pen Needle Use as instructed. Inject into the skin once daily   cetirizine 10 MG tablet Commonly known as: ZYRTEC Take 1 tablet (10 mg total) by mouth daily.   fluticasone 50 MCG/ACT nasal spray Commonly known as: FLONASE Place 1 spray into both nostrils daily for 3 days.   Lantus SoloStar 100 UNIT/ML Solostar Pen Generic drug: insulin glargine Inject 20 Units into the skin daily.   Norgestimate-Ethinyl Estradiol Triphasic 0.18/0.215/0.25 MG-35 MCG tablet Commonly known as: Tri-Sprintec Take 1 tablet by mouth daily.   ondansetron 4 MG disintegrating tablet Commonly known as: ZOFRAN-ODT Take 1 tablet (4 mg total) by mouth every 8 (eight) hours as needed for nausea or vomiting.   traMADol 50 MG tablet Commonly known as: ULTRAM Take 1-2 tablets (50-100 mg total) by mouth every 6 (six) hours as needed for moderate pain or severe pain.   Victoza 18 MG/3ML Sopn Generic drug: liraglutide Inject 1.8 mg into the skin daily.               Discharge Care Instructions  (From admission, onward)           Start     Ordered   07/17/21 0000  Discharge wound care:       Comments: It is good for closed incisions and even open wounds  to be washed every day.  Shower every day.  Short baths are fine.  Wash the incisions and wounds clean with soap & water.    You may leave closed incisions open to air if it is dry.   You may cover the incision with  clean gauze & replace it after your daily shower for comfort.  TEGADERM:  You have clear gauze band-aid dressings over your closed incision(s).  Remove the dressings 3 days after surgery.   07/17/21 1009              Follow-up Information     [facility], [place]. Go on 08/16/2021.   Specialty: General Surgery Why: 8:30 AM with Malachi Pro, PA-C. Please arrive 30 min prior to appointment time. Have ID and insurance card with you. Contact information: [address] [place] [address] [phone]                 Signed: Norm Parcel , [facility] 07/18/2021, 9:20 AM Please see Amion for pager number during day hours 7:00am-4:30pm

## 2021-07-18 NOTE — Progress Notes (Signed)
Patient was given discharge instructions, and all questions were answered.  Patient was stable for discharge and was taken to the main exit by wheelchair. 

## 2021-07-18 NOTE — Progress Notes (Signed)
Transition of Care [facility]) Screening Note  Patient Details  Name: Melissa Gilmore Date of Birth: [birth year]  Transition of [facility]) CM/SW Contact:    Ewing Schlein, LCSW Phone Number: 07/18/2021, 9:48 AM  Transition of Care Department [facility]) has reviewed patient and no TOC needs have been identified at this time. We will continue to monitor patient advancement through interdisciplinary progression rounds. If new patient transition needs arise, please place a TOC consult.

## 2021-07-19 NOTE — Anesthesia Postprocedure Evaluation (Signed)
Anesthesia Post Note  Patient: Melissa Gilmore  Procedure(s) Performed: SINGLE SITE LAPAROSCOPIC CHOLECYSTECTOMY WITH INTRAOPERATIVE CHOLANGIOGRAM; LIVER BIOPSY (Abdomen)     Patient location during evaluation: PACU Anesthesia Type: General Level of consciousness: awake and alert Pain management: pain level controlled Vital Signs Assessment: post-procedure vital signs reviewed and stable Respiratory status: spontaneous breathing, nonlabored ventilation, respiratory function stable and patient connected to nasal cannula oxygen Cardiovascular status: blood pressure returned to baseline and stable Postop Assessment: no apparent nausea or vomiting Anesthetic complications: no   No notable events documented.  Last Vitals:  Vitals:   07/18/21 0942 07/18/21 1227  BP: (!) 155/100 (!) 136/94  Pulse: 64 64  Resp: 14   Temp: (!) 36.4 C   SpO2: 100%     Last Pain:  Vitals:   07/18/21 1241  TempSrc:   PainSc: 4                  Carmencita Cusic P Miriya Cloer

## 2021-07-20 ENCOUNTER — Telehealth: Payer: Self-pay

## 2021-07-20 NOTE — Telephone Encounter (Signed)
Transition Care Management Follow-up Telephone Call Date of discharge and from where: [facility] 07/16/21-07/18/21 How have you been since you were released from the hospital? " I am still sore and I continue to be nauseated.  The  Zofran that was ordered doesn't seem to be helping. I can't eat but I have been able to able to keep fluids down". Any questions or concerns? Yes- Discussed patients complaints of nausea and not being able to tolerate food.  Patient is taking Zofran, without good effect. Recommended patient contact [facility] to discuss with them.  Provided phone number.  Incision is clean and dry per patient.  Items Reviewed: Did the pt receive and understand the discharge instructions provided? Yes  Medications obtained and verified? Yes  Other? No  Any new allergies since your discharge? No  Dietary orders reviewed? Yes Do you have support at home? Yes - mother and sister are helping patient  Home Care and Equipment/Supplies: Were home health services ordered? no If so, what is the name of the agency? N/A  Has the agency set up a time to come to the patient's home? not applicable Were any new equipment or medical supplies ordered?  No What is the name of the medical supply agency? N/A Were you able to get the supplies/equipment? no Do you have any questions related to the use of the equipment or supplies? No  Functional Questionnaire: (I = Independent and D = Dependent) ADLs: I  Bathing/Dressing- I  Meal Prep- I  Eating- I  Maintaining continence- I  Transferring/Ambulation- I  Managing Meds- I  Follow up appointments reviewed:  PCP Hospital f/u appt confirmed? Yes  Scheduled to see Georganna Skeans on 08/02/21 @ 10:40. Specialist Hospital f/u appt confirmed? Yes  Scheduled to see Saunders Glance, PA-C on 08/16/21 @ 08:30. Are transportation arrangements needed? No  If their condition worsens, is the pt aware to call PCP or go to the Emergency Dept.? Yes Was the  patient provided with contact information for the PCP's office or ED? Yes Was to pt encouraged to call back with questions or concerns? Yes Jodelle Gross, RN, BSN, CCM Care Management Coordinator Phone: [phone]/Fax: [phone]

## 2021-07-21 ENCOUNTER — Emergency Department (HOSPITAL_COMMUNITY)
Admission: EM | Admit: 2021-07-21 | Discharge: 2021-07-21 | Disposition: A | Discharge status: Home / Self Care | Payer: Medicaid Other | Attending: Emergency Medicine | Admitting: Emergency Medicine

## 2021-07-21 ENCOUNTER — Encounter (HOSPITAL_COMMUNITY): Payer: Self-pay | Admitting: Emergency Medicine

## 2021-07-21 ENCOUNTER — Other Ambulatory Visit: Payer: Self-pay

## 2021-07-21 ENCOUNTER — Emergency Department (HOSPITAL_COMMUNITY): Payer: Medicaid Other

## 2021-07-21 DIAGNOSIS — Z794 Long term (current) use of insulin: Secondary | ICD-10-CM | POA: Insufficient documentation

## 2021-07-21 DIAGNOSIS — G8918 Other acute postprocedural pain: Secondary | ICD-10-CM

## 2021-07-21 DIAGNOSIS — E119 Type 2 diabetes mellitus without complications: Secondary | ICD-10-CM | POA: Diagnosis not present

## 2021-07-21 DIAGNOSIS — R112 Nausea with vomiting, unspecified: Secondary | ICD-10-CM | POA: Insufficient documentation

## 2021-07-21 DIAGNOSIS — Z9889 Other specified postprocedural states: Secondary | ICD-10-CM | POA: Insufficient documentation

## 2021-07-21 LAB — COMPREHENSIVE METABOLIC PANEL
ALT: 67 U/L — ABNORMAL HIGH (ref 0–44)
AST: 68 U/L — ABNORMAL HIGH (ref 15–41)
Albumin: 3.6 g/dL (ref 3.5–5.0)
Alkaline Phosphatase: 113 U/L (ref 38–126)
Anion gap: 6 (ref 5–15)
BUN: <5 mg/dL — ABNORMAL LOW (ref 6–20)
CO2: 27 mmol/L (ref 22–32)
Calcium: 8.8 mg/dL — ABNORMAL LOW (ref 8.9–10.3)
Chloride: 103 mmol/L (ref 98–111)
Creatinine, Ser: 0.57 mg/dL (ref 0.44–1.00)
GFR, Estimated: >60 mL/min (ref >60)
Glucose, Bld: 140 mg/dL — ABNORMAL HIGH (ref 70–99)
Potassium: 3.9 mmol/L (ref 3.5–5.1)
Sodium: 136 mmol/L (ref 135–145)
Total Bilirubin: 0.6 mg/dL (ref 0.3–1.2)
Total Protein: 7.5 g/dL (ref 6.5–8.1)

## 2021-07-21 LAB — CBC WITH DIFFERENTIAL/PLATELET
Abs Immature Granulocytes: 0.05 10*3/uL (ref 0.00–0.07)
Basophils Absolute: 0 10*3/uL (ref 0.0–0.1)
Basophils Relative: 0 %
Eosinophils Absolute: 0.2 10*3/uL (ref 0.0–0.5)
Eosinophils Relative: 2 %
HCT: 42 % (ref 36.0–46.0)
Hemoglobin: 14.3 g/dL (ref 12.0–15.0)
Immature Granulocytes: 1 %
Lymphocytes Relative: 33 %
Lymphs Abs: 2.4 10*3/uL (ref 0.7–4.0)
MCH: 29.2 pg (ref 26.0–34.0)
MCHC: 34 g/dL (ref 30.0–36.0)
MCV: 85.9 fL (ref 80.0–100.0)
Monocytes Absolute: 0.5 10*3/uL (ref 0.1–1.0)
Monocytes Relative: 7 %
Neutro Abs: 4.2 10*3/uL (ref 1.7–7.7)
Neutrophils Relative %: 57 %
Platelets: 302 10*3/uL (ref 150–400)
RBC: 4.89 MIL/uL (ref 3.87–5.11)
RDW: 12.7 % (ref 11.5–15.5)
WBC: 7.3 10*3/uL (ref 4.0–10.5)
nRBC: 0 % (ref 0.0–0.2)

## 2021-07-21 LAB — LIPASE, BLOOD: Lipase: 29 U/L (ref 11–51)

## 2021-07-21 LAB — SURGICAL PATHOLOGY

## 2021-07-21 MED ORDER — SODIUM CHLORIDE 0.9 % IV BOLUS
1000.0000 mL | Freq: Once | INTRAVENOUS | Status: AC
  Administered 2021-07-21: 1000 mL via INTRAVENOUS

## 2021-07-21 MED ORDER — IOHEXOL 300 MG/ML  SOLN
100.0000 mL | Freq: Once | INTRAMUSCULAR | Status: AC | PRN
  Administered 2021-07-21: 100 mL via INTRAVENOUS

## 2021-07-21 MED ORDER — SODIUM CHLORIDE (PF) 0.9 % IJ SOLN
INTRAMUSCULAR | Status: AC
  Filled 2021-07-21: qty 50

## 2021-07-21 MED ORDER — ONDANSETRON HCL 4 MG/2ML IJ SOLN
4.0000 mg | Freq: Once | INTRAMUSCULAR | Status: AC
  Administered 2021-07-21: 4 mg via INTRAVENOUS
  Filled 2021-07-21: qty 2

## 2021-07-21 MED ORDER — METOCLOPRAMIDE HCL 10 MG PO TABS: 10.0000 mg | ORAL_TABLET | Freq: Four times a day (QID) | ORAL | 0 refills | Status: DC | PRN

## 2021-07-21 MED ORDER — FENTANYL CITRATE PF 50 MCG/ML IJ SOSY
50.0000 ug | PREFILLED_SYRINGE | Freq: Once | INTRAMUSCULAR | Status: AC
  Administered 2021-07-21: 50 ug via INTRAVENOUS
  Filled 2021-07-21: qty 1

## 2021-07-21 MED ORDER — HYDROCODONE-ACETAMINOPHEN 5-325 MG PO TABS: 1.0000 | ORAL_TABLET | Freq: Four times a day (QID) | ORAL | 0 refills | Status: DC | PRN

## 2021-07-21 NOTE — ED Provider Notes (Signed)
[facility] DEPT Provider Note: Georgena Spurling, MD, FACEP  CSN: [id number] MRN: [id number] ARRIVAL: 07/21/21 at [place]: RESB/RESB   CHIEF COMPLAINT  Post-op Problem   HISTORY OF PRESENT ILLNESS  07/21/21 5:30 AM Melissa Gilmore is a 26 y.o. female who underwent a laparoscopic cholecystectomy on 07/17/2021.  She was discharged home the next day.  Since discharge she has had nausea and vomiting and not been able to keep any food down.  She can keep liquids down but not solids.  This is causing her to feel weak.  She is having associated sharp right upper quadrant pain which she rates as an 8 out of 10.  She was prescribed Phenergan but it has not been helping.  She was also prescribed tramadol and it has not been helping.  She also states the laparoscopy incision at her umbilicus burns.   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[place])    Schizophrenia ([place])     Past Surgical History:  Procedure Laterality Date   CHOLECYSTECTOMY N/A 07/17/2021   Procedure: SINGLE SITE LAPAROSCOPIC CHOLECYSTECTOMY WITH INTRAOPERATIVE CHOLANGIOGRAM; LIVER BIOPSY;  Surgeon: Michael Boston, MD;  Location: [facility];  Service: General;  Laterality: N/A;   NO PAST SURGERIES      Family History  Problem Relation Age of Onset   Diabetes Maternal Grandmother    Stomach cancer Maternal Grandmother     Social History   Tobacco Use   Smoking status: Never   Smokeless tobacco: Never  Substance Use Topics   Alcohol use: Not Currently   Drug use: Not Currently    Prior to Admission medications   Medication Sig Start Date End Date Taking? Authorizing Provider  HYDROcodone-acetaminophen (NORCO) 5-325 MG tablet Take 1-2 tablets by mouth every 6 (six) hours as needed for severe pain. 07/21/21  Yes Alekxander Isola, Jenny Reichmann, MD  metoCLOPramide (REGLAN) 10 MG tablet Take 1 tablet (10 mg total) by mouth every 6 (six)  hours as needed for nausea or vomiting. 07/21/21  Yes Fabian Coca, MD  Accu-Chek Softclix Lancets lancets Use to check blood sugar twice daily. Dx E11.65 08/06/20   Charlott Rakes, MD  Blood Glucose Monitoring Suppl (ACCU-CHEK GUIDE ME) w/Device KIT Use to check blood sugar twice daily. Dx E11.65 08/06/20   Charlott Rakes, MD  glucose blood (ACCU-CHEK GUIDE) test strip Use to check blood sugar twice daily. Dx E11.65 08/06/20   Charlott Rakes, MD  insulin glargine (LANTUS SOLOSTAR) 100 UNIT/ML Solostar Pen Inject 20 Units into the skin daily. 11/08/20 07/16/22  Gildardo Pounds, NP  Insulin Pen Needle (B-D UF III MINI PEN NEEDLES) 31G X 5 MM MISC Use as instructed. Inject into the skin once daily 11/08/20   Gildardo Pounds, NP  liraglutide (VICTOZA) 18 MG/3ML SOPN Inject 1.8 mg into the skin daily. 11/08/20   Gildardo Pounds, NP  Norgestimate-Ethinyl Estradiol Triphasic (TRI-SPRINTEC) 0.18/0.215/0.25 MG-35 MCG tablet Take 1 tablet by mouth daily. 05/10/21   Gildardo Pounds, NP  ondansetron (ZOFRAN-ODT) 4 MG disintegrating tablet Take 1 tablet (4 mg total) by mouth every 8 (eight) hours as needed for nausea or vomiting. 07/17/21   Michael Boston, MD    Allergies Patient has no known allergies.   REVIEW OF SYSTEMS  Negative except as noted here or in the History of Present Illness.   PHYSICAL EXAMINATION  Initial Vital Signs Blood pressure (!) 135/99, pulse 72, temperature  98.9 F (37.2 C), temperature source Oral, resp. rate 18, height '5\' 4"'  (1.626 m), weight 105.2 kg, last menstrual period 06/21/2021, SpO2 98 %.  Examination General: Well-developed, high BMI female in no acute distress; appearance consistent with age of record HENT: normocephalic; atraumatic Eyes: pupils equal, round and reactive to light; extraocular muscles intact Neck: supple Heart: regular rate and rhythm Lungs: clear to auscultation bilaterally Abdomen: soft; nondistended; right upper quadrant tenderness; well-healing  laparoscopy incision at umbilicus; bowel sounds present Extremities: No deformity; full range of motion; pulses normal Neurologic: Awake, alert; motor function intact in all extremities and symmetric; no facial droop Skin: Warm and dry Psychiatric: Normal mood and affect   RESULTS  Summary of this visit's results, reviewed and interpreted by myself:   EKG Interpretation  Date/Time:    Ventricular Rate:    PR Interval:    QRS Duration:   QT Interval:    QTC Calculation:   R Axis:     Text Interpretation:         Laboratory Studies: Results for orders placed or performed during the hospital encounter of 07/21/21 (from the past 24 hour(s))  CBC with Differential     Status: None   Collection Time: 07/21/21  1:10 AM  Result Value Ref Range   WBC 7.3 4.0 - 10.5 K/uL   RBC 4.89 3.87 - 5.11 MIL/uL   Hemoglobin 14.3 12.0 - 15.0 g/dL   HCT 42.0 36.0 - 46.0 %   MCV 85.9 80.0 - 100.0 fL   MCH 29.2 26.0 - 34.0 pg   MCHC 34.0 30.0 - 36.0 g/dL   RDW 12.7 11.5 - 15.5 %   Platelets 302 150 - 400 K/uL   nRBC 0.0 0.0 - 0.2 %   Neutrophils Relative % 57 %   Neutro Abs 4.2 1.7 - 7.7 K/uL   Lymphocytes Relative 33 %   Lymphs Abs 2.4 0.7 - 4.0 K/uL   Monocytes Relative 7 %   Monocytes Absolute 0.5 0.1 - 1.0 K/uL   Eosinophils Relative 2 %   Eosinophils Absolute 0.2 0.0 - 0.5 K/uL   Basophils Relative 0 %   Basophils Absolute 0.0 0.0 - 0.1 K/uL   Immature Granulocytes 1 %   Abs Immature Granulocytes 0.05 0.00 - 0.07 K/uL  Comprehensive metabolic panel     Status: Abnormal   Collection Time: 07/21/21  1:10 AM  Result Value Ref Range   Sodium 136 135 - 145 mmol/L   Potassium 3.9 3.5 - 5.1 mmol/L   Chloride 103 98 - 111 mmol/L   CO2 27 22 - 32 mmol/L   Glucose, Bld 140 (H) 70 - 99 mg/dL   BUN <5 (L) 6 - 20 mg/dL   Creatinine, Ser 0.57 0.44 - 1.00 mg/dL   Calcium 8.8 (L) 8.9 - 10.3 mg/dL   Total Protein 7.5 6.5 - 8.1 g/dL   Albumin 3.6 3.5 - 5.0 g/dL   AST 68 (H) 15 - 41 U/L    ALT 67 (H) 0 - 44 U/L   Alkaline Phosphatase 113 38 - 126 U/L   Total Bilirubin 0.6 0.3 - 1.2 mg/dL   GFR, Estimated >60 >60 mL/min   Anion gap 6 5 - 15  Lipase, blood     Status: None   Collection Time: 07/21/21  1:10 AM  Result Value Ref Range   Lipase 29 11 - 51 U/L   Imaging Studies: CT ABDOMEN PELVIS W CONTRAST  Result Date: 07/21/2021 CLINICAL DATA:  26 year old  female with history of right upper quadrant abdominal pain, nausea and vomiting. Status post cholecystectomy on 07/17/2021. EXAM: CT ABDOMEN AND PELVIS WITH CONTRAST TECHNIQUE: Multidetector CT imaging of the abdomen and pelvis was performed using the standard protocol following bolus administration of intravenous contrast. RADIATION DOSE REDUCTION: This exam was performed according to the departmental dose-optimization program which includes automated exposure control, adjustment of the mA and/or kV according to patient size and/or use of iterative reconstruction technique. CONTRAST:  186m OMNIPAQUE IOHEXOL 300 MG/ML  SOLN COMPARISON:  CT the abdomen and pelvis 07/16/2021. FINDINGS: Lower chest: Trace bilateral pleural effusions lying dependently. Patchy areas of ground-glass attenuation and ill-defined consolidative opacities along with septal thickening noted in the lung bases. Hepatobiliary: There is a scalloped appearance adjacent to segments 4 A and 4B in the liver, the appearance of which is suggestive of a trace amount of subcapsular fluid which is low-attenuation, potentially small subcapsular seromas. The largest of these measures up to 3.1 x 0.7 cm (axial image 18 of series 2). No other suspicious hepatic lesions. No intra or extrahepatic biliary ductal dilatation. Status post cholecystectomy. Trace amount of soft tissue stranding in the fat within the gallbladder fossa, not unexpected given the recent surgery. No fluid collection in the gallbladder fossa. Pancreas: No pancreatic mass. No pancreatic ductal dilatation. No  pancreatic or peripancreatic fluid collections or inflammatory changes. Spleen: Unremarkable. Adrenals/Urinary Tract: Bilateral kidneys and adrenal glands are normal in appearance. No hydroureteronephrosis. Urinary bladder is normal in appearance. Stomach/Bowel: The appearance of the stomach is normal. There is no pathologic dilatation of small bowel or colon. Normal appendix. Vascular/Lymphatic: No significant atherosclerotic disease, aneurysm or dissection noted in the abdominal or pelvic vasculature. No lymphadenopathy noted in the abdomen or pelvis. Reproductive: Uterus and ovaries are unremarkable in appearance. Other: Trace volume of free fluid in the cul-de-sac, likely physiologic in this young female patient. No larger volume of ascites. No pneumoperitoneum. Musculoskeletal: Small amount of soft tissue thickening and haziness in the adjacent subcutaneous fat in and around the umbilicus, presumably related to recent laparoscopy port access. No well-defined fluid collection in this region to suggest postoperative abscess. There are no aggressive appearing lytic or blastic lesions noted in the visualized portions of the skeleton. IMPRESSION: 1. Postoperative findings of recent cholecystectomy. No unexpected postoperative fluid collection in the gallbladder fossa. However, there does appear to be a small amount of postoperative fluid in the subcapsular aspect of segments 4A and 4B of the liver. This may represent some small postoperative subcapsular seromas, however, the sterility of these collections can not be ascertained on today's examination and clinical correlation for signs and symptoms of infection is recommended, as the possibility of early subcapsular abscesses is not excluded. 2. Trace bilateral pleural effusions lying dependently. There also patchy opacities in the lung bases, which likely largely reflect resolving postoperative atelectasis, although mild aspiration pneumonitis could have a similar  appearance. 3. Trace volume of free fluid in the cul-de-sac, presumably physiologic in this young female patient. Electronically Signed   By: DVinnie LangtonM.D.   On: 07/21/2021 06:32    ED COURSE and MDM  Nursing notes, initial and subsequent vitals signs, including pulse oximetry, reviewed and interpreted by myself.  Vitals:   07/21/21 0200 07/21/21 0215 07/21/21 0538 07/21/21 0630  BP: (!) 135/95 (!) 135/99 (!) 137/94 132/86  Pulse: 73 72 85 80  Resp: '18 18 18 17  ' Temp:   97.9 F (36.6 C)   TempSrc:   Oral  SpO2: 99% 98% 99% 98%  Weight:      Height:       Medications  fentaNYL (SUBLIMAZE) injection 50 mcg (has no administration in time range)  sodium chloride 0.9 % bolus 1,000 mL (1,000 mLs Intravenous New Bag/Given 07/21/21 0555)  ondansetron (ZOFRAN) injection 4 mg (4 mg Intravenous Given 07/21/21 0556)  fentaNYL (SUBLIMAZE) injection 50 mcg (50 mcg Intravenous Given 07/21/21 0557)  sodium chloride (PF) 0.9 % injection (  Given by Other 07/21/21 0601)  iohexol (OMNIPAQUE) 300 MG/ML solution 100 mL (100 mLs Intravenous Contrast Given 07/21/21 6153)   6:37 AM CT scan and radiologist interpretation reviewed.  As the patient is afebrile and has no leukocytosis I doubt these represent infections seen in the liver.  I think the more likely explanation is that the patient is being treated with tramadol postoperatively which is a poor analgesic.  She also was not responding to the Phenergan she was given.  She did respond to the Zofran in the ED and we will treat her with Zofran and a stronger analgesic.  She should return for worsening symptoms or fever.   PROCEDURES  Procedures   ED DIAGNOSES     ICD-10-CM   1. Postoperative pain  G89.18     2. Postoperative nausea and vomiting  R11.2    Z98.890          Shanon Rosser, MD 07/21/21 [id number]

## 2021-07-21 NOTE — ED Triage Notes (Signed)
  Patient comes in with RUQ and emesis that has been going on for a few days.  Patient states she had her gallbladder removed on Sunday and was discharged on Monday afternoon.  Patient states since then she has been unable to tolerate any food.  Can tolerate liquids but started feeling weak.  Given prescription for phenergan but states it does not help.  Pain 8/10, sharp pain RUQ.

## 2021-08-02 ENCOUNTER — Ambulatory Visit: Payer: Medicaid Other | Admitting: Family Medicine

## 2021-08-03 ENCOUNTER — Ambulatory Visit: Payer: Medicaid Other | Admitting: Family Medicine

## 2021-09-14 ENCOUNTER — Ambulatory Visit (INDEPENDENT_AMBULATORY_CARE_PROVIDER_SITE_OTHER): Payer: Medicaid Other | Admitting: Family Medicine

## 2021-09-14 ENCOUNTER — Encounter: Payer: Self-pay | Admitting: Family Medicine

## 2021-09-14 VITALS — BP 131/86 | HR 74 | Temp 98.1°F | Resp 16 | Ht 64.0 in | Wt 223.2 lb

## 2021-09-14 DIAGNOSIS — R300 Dysuria: Secondary | ICD-10-CM | POA: Diagnosis not present

## 2021-09-14 DIAGNOSIS — Z794 Long term (current) use of insulin: Secondary | ICD-10-CM | POA: Diagnosis not present

## 2021-09-14 DIAGNOSIS — E119 Type 2 diabetes mellitus without complications: Secondary | ICD-10-CM

## 2021-09-14 DIAGNOSIS — N760 Acute vaginitis: Secondary | ICD-10-CM | POA: Diagnosis not present

## 2021-09-14 LAB — POCT URINALYSIS DIP (CLINITEK)
Glucose, UA: NEGATIVE mg/dL
Ketones, POC UA: NEGATIVE mg/dL
Nitrite, UA: NEGATIVE
POC PROTEIN,UA: 100 — AB
Spec Grav, UA: >=1.03 — AB (ref 1.010–1.025)
Urobilinogen, UA: 0.2 E.U./dL
pH, UA: 5.5 (ref 5.0–8.0)

## 2021-09-14 LAB — POCT GLYCOSYLATED HEMOGLOBIN (HGB A1C): Hemoglobin A1C: 8.3 % — AB (ref 4.0–5.6)

## 2021-09-14 MED ORDER — NITROFURANTOIN MONOHYD MACRO 100 MG PO CAPS: 100.0000 mg | ORAL_CAPSULE | Freq: Two times a day (BID) | ORAL | 0 refills | Status: DC

## 2021-09-14 MED ORDER — FLUCONAZOLE 150 MG PO TABS: 150.0000 mg | ORAL_TABLET | Freq: Once | ORAL | 0 refills | Status: AC

## 2021-09-14 MED ORDER — METRONIDAZOLE 500 MG PO TABS: 500.0000 mg | ORAL_TABLET | Freq: Two times a day (BID) | ORAL | 0 refills | Status: AC

## 2021-09-14 NOTE — Progress Notes (Signed)
Established Patient Office Visit  Subjective    Patient ID: Melissa Gilmore, female    DOB: 1995-09-11  Age: 26 y.o. MRN: [id number]  CC: No chief complaint on file.   HPI [name] presents for follow up of diabetes. She also complaints of burning with urination and vaginal discharge.    Outpatient Encounter Medications as of 09/14/2021  Medication Sig   Accu-Chek Softclix Lancets lancets Use to check blood sugar twice daily. Dx E11.65   Blood Glucose Monitoring Suppl (ACCU-CHEK GUIDE ME) w/Device KIT Use to check blood sugar twice daily. Dx E11.65   fluconazole (DIFLUCAN) 150 MG tablet Take 1 tablet (150 mg total) by mouth once for 1 dose.   glucose blood (ACCU-CHEK GUIDE) test strip Use to check blood sugar twice daily. Dx E11.65   HYDROcodone-acetaminophen (NORCO) 5-325 MG tablet Take 1-2 tablets by mouth every 6 (six) hours as needed for severe pain.   insulin glargine (LANTUS SOLOSTAR) 100 UNIT/ML Solostar Pen Inject 20 Units into the skin daily.   Insulin Pen Needle (B-D UF III MINI PEN NEEDLES) 31G X 5 MM MISC Use as instructed. Inject into the skin once daily   liraglutide (VICTOZA) 18 MG/3ML SOPN Inject 1.8 mg into the skin daily.   metoCLOPramide (REGLAN) 10 MG tablet Take 1 tablet (10 mg total) by mouth every 6 (six) hours as needed for nausea or vomiting.   metroNIDAZOLE (FLAGYL) 500 MG tablet Take 1 tablet (500 mg total) by mouth 2 (two) times daily for 7 days.   nitrofurantoin, macrocrystal-monohydrate, (MACROBID) 100 MG capsule Take 1 capsule (100 mg total) by mouth 2 (two) times daily.   Norgestimate-Ethinyl Estradiol Triphasic (TRI-SPRINTEC) 0.18/0.215/0.25 MG-35 MCG tablet Take 1 tablet by mouth daily.   ondansetron (ZOFRAN-ODT) 4 MG disintegrating tablet Take 1 tablet (4 mg total) by mouth every 8 (eight) hours as needed for nausea or vomiting.   No facility-administered encounter medications on file as of 09/14/2021.    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[place])    Schizophrenia ([place])     Past Surgical History:  Procedure Laterality Date   CHOLECYSTECTOMY N/A 07/17/2021   Procedure: SINGLE SITE LAPAROSCOPIC CHOLECYSTECTOMY WITH INTRAOPERATIVE CHOLANGIOGRAM; LIVER BIOPSY;  Surgeon: Michael Boston, MD;  Location: [facility];  Service: General;  Laterality: N/A;   NO PAST SURGERIES      Family History  Problem Relation Age of Onset   Diabetes Maternal Grandmother    Stomach cancer Maternal Grandmother     Social History   Socioeconomic History   Marital status: Single    Spouse name: Not on file   Number of children: Not on file   Years of education: Not on file   Highest education level: Not on file  Occupational History   Not on file  Tobacco Use   Smoking status: Never   Smokeless tobacco: Never  Substance and Sexual Activity   Alcohol use: Not Currently   Drug use: Not Currently   Sexual activity: Not Currently  Other Topics Concern   Not on file  Social History Narrative   Not on file   Social Determinants of Health   Financial Resource Strain: Not on file  Food Insecurity: Not on file  Transportation Needs: Not on file  Physical Activity: Not on file  Stress: Not on file  Social Connections: Not on file  Intimate Partner Violence: Not on  file    Review of Systems  Genitourinary:  Positive for dysuria.  All other systems reviewed and are negative.       Objective    BP 131/86   Pulse 74   Temp 98.1 F (36.7 C) (Oral)   Resp 16   Ht _0  (1.626 m)   Wt 223 lb 3.2 oz (101.2 kg)   SpO2 98%   BMI 38.31 kg/m   Physical Exam Vitals and nursing note reviewed.  Constitutional:      General: She is not in acute distress. Cardiovascular:     Rate and Rhythm: Normal rate and regular rhythm.  Pulmonary:     Effort: Pulmonary effort is normal.     Breath sounds: Normal breath sounds.   Abdominal:     Palpations: Abdomen is soft.     Tenderness: There is no abdominal tenderness.  Musculoskeletal:     Right lower leg: No edema.     Left lower leg: No edema.  Neurological:     General: No focal deficit present.     Mental Status: She is alert and oriented to person, place, and time.         Assessment & Plan:   1. Insulin-requiring or dependent type II diabetes mellitus (HCC) Improved A1c and at goal. Continue and monitor - POCT glycosylated hemoglobin (Hb A1C) - Microalbumin / creatinine urine ratio  2. Dysuria Macrobid prescribed - POCT URINALYSIS DIP (CLINITEK)  3. Vaginitis and vulvovaginitis Flagyl nd diflucan prescribed    No follow-ups on file.   Becky Sax, MD

## 2021-09-15 LAB — MICROALBUMIN / CREATININE URINE RATIO
Creatinine, Urine: 318.6 mg/dL
Microalb/Creat Ratio: 40 mg/g creat — ABNORMAL HIGH (ref 0–29)
Microalbumin, Urine: 126.9 ug/mL

## 2021-09-26 ENCOUNTER — Ambulatory Visit: Payer: Medicaid Other | Admitting: Nurse Practitioner

## 2021-12-15 ENCOUNTER — Ambulatory Visit (INDEPENDENT_AMBULATORY_CARE_PROVIDER_SITE_OTHER): Payer: Medicaid Other | Admitting: Family Medicine

## 2021-12-15 ENCOUNTER — Encounter: Payer: Self-pay | Admitting: Family Medicine

## 2021-12-15 VITALS — BP 128/90 | HR 89 | Temp 98.1°F | Resp 16 | Wt 223.8 lb

## 2021-12-15 DIAGNOSIS — E119 Type 2 diabetes mellitus without complications: Secondary | ICD-10-CM

## 2021-12-15 DIAGNOSIS — Z30013 Encounter for initial prescription of injectable contraceptive: Secondary | ICD-10-CM | POA: Diagnosis not present

## 2021-12-15 DIAGNOSIS — Z794 Long term (current) use of insulin: Secondary | ICD-10-CM

## 2021-12-15 DIAGNOSIS — F411 Generalized anxiety disorder: Secondary | ICD-10-CM

## 2021-12-15 DIAGNOSIS — Z23 Encounter for immunization: Secondary | ICD-10-CM

## 2021-12-15 MED ORDER — MEDROXYPROGESTERONE ACETATE 150 MG/ML IM SUSP
150.0000 mg | Freq: Once | INTRAMUSCULAR | Status: AC
  Administered 2021-12-15: 150 mg via INTRAMUSCULAR

## 2021-12-15 NOTE — Progress Notes (Signed)
Date last injection given: Thu 15 Dec 2021  Date next injection due (12 weeks): Thu 09 Mar 2022    Earliest possible date (10 weeks): Thu 23 Feb 2022  Very latest date possible (12 weeks + 5 days): Tue 14 Mar 2022

## 2021-12-18 ENCOUNTER — Telehealth: Payer: Medicaid Other

## 2021-12-19 ENCOUNTER — Ambulatory Visit
Admission: EM | Admit: 2021-12-19 | Discharge: 2021-12-19 | Disposition: A | Discharge status: Home / Self Care | Payer: Medicaid Other | Attending: Urgent Care | Admitting: Urgent Care

## 2021-12-19 ENCOUNTER — Encounter: Payer: Self-pay | Admitting: Family Medicine

## 2021-12-19 DIAGNOSIS — U071 COVID-19: Secondary | ICD-10-CM | POA: Diagnosis not present

## 2021-12-19 MED ORDER — NIRMATRELVIR/RITONAVIR (PAXLOVID)TABLET: 3.0000 | ORAL_TABLET | Freq: Two times a day (BID) | ORAL | 0 refills | Status: AC

## 2021-12-19 NOTE — Discharge Instructions (Signed)
Because of your positive COVID test and your known history of diabetes, you are considered to be high risk.  I have prescribed you Paxlovid, the antiviral. Please read the attached handout prior to starting Paxlovid to understand its risks, benefits, and indications. You must be out of work for 5 days total, may return after a 5-day quarantine wearing an N95 mask. Should you develop severe shortness of breath, chest pain, or uncontrollable fever, please had to the emergency room.

## 2021-12-19 NOTE — ED Triage Notes (Signed)
Pt c/o sore throat, body aches onset yesterday states tested covid(+) yesterday at home.

## 2021-12-19 NOTE — ED Provider Notes (Signed)
[facility]    CSN: [id number] Arrival date & time: 12/19/21  1156      History   Chief Complaint Chief Complaint  Patient presents with   covid(+)    HPI Melissa Gilmore is a 26 y.o. female.   26 year old female with a known history of diabetes mellitus presents today due to concerns of a positive home COVID test.  States that 1 week ago her mom tested positive.  She and her sister both started feeling ill yesterday and took a COVID test last evening.  They were both positive.  Patient states her current symptoms are fatigue, weakness, nasal congestion, sore throat, body aches.  Denies fever.  States she feels some tightness when taking a deep breath, but denies shortness of breath.  Denies chest pain or palpitations.  Has not taken any medication for symptoms.  States that her sugars are well controlled and are not abnormal despite her illness.  Had [place] in 2020 and states her only symptoms at that time were anosmia.      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[place]) 07/17/2021   Schizophrenia ([place]) 07/17/2021   Depression 07/17/2021   Insulin-requiring or dependent type II diabetes mellitus ([place]) 07/17/2021   Body mass index (BMI) 35.0-35.9, adult 07/17/2021   Dyslipidemia 07/17/2021   History of abnormal uterine bleeding 07/17/2021   Acute cholecystitis with chronic cholecystitis 07/16/2021    Past Surgical History:  Procedure Laterality Date   CHOLECYSTECTOMY N/A 07/17/2021   Procedure: SINGLE SITE LAPAROSCOPIC CHOLECYSTECTOMY WITH INTRAOPERATIVE CHOLANGIOGRAM; LIVER BIOPSY;  Surgeon: Michael Boston, MD;  Location: [facility];  Service: General;  Laterality: N/A;   NO PAST SURGERIES      OB History   No obstetric history on file.       Home Medications    Prior to Admission medications   Medication Sig Start Date End Date Taking? Authorizing Provider  nirmatrelvir/ritonavir EUA (PAXLOVID) 20 x 150 MG & 10 x 100MG TABS Take 3 tablets by mouth 2 (two) times daily for 5 days. Patient GFR is 60. Take nirmatrelvir (150 mg) two tablets twice daily for 5 days and ritonavir (100 mg) one tablet twice daily for 5 days. 12/19/21 12/24/21 Yes Asmaa Tirpak L, PA  Accu-Chek Softclix Lancets lancets Use to check blood sugar twice daily. Dx E11.65 08/06/20   Charlott Rakes, MD  Blood Glucose Monitoring Suppl (ACCU-CHEK GUIDE ME) w/Device KIT Use to check blood sugar twice daily. Dx E11.65 08/06/20   Charlott Rakes, MD  glucose blood (ACCU-CHEK GUIDE) test strip Use to check blood sugar twice daily. Dx E11.65 08/06/20   Charlott Rakes, MD  HYDROcodone-acetaminophen (NORCO) 5-325 MG tablet Take 1-2 tablets by mouth every 6 (six) hours as needed for severe pain. 07/21/21   Molpus, John, MD  insulin glargine (LANTUS SOLOSTAR) 100 UNIT/ML Solostar Pen Inject 20 Units into the skin daily. 11/08/20 07/16/22  Gildardo Pounds, NP  Insulin Pen Needle (B-D UF III MINI PEN NEEDLES) 31G X 5 MM MISC Use as instructed. Inject into the skin once daily 11/08/20   Gildardo Pounds, NP  liraglutide (VICTOZA) 18 MG/3ML SOPN Inject 1.8 mg into the skin daily. 11/08/20   Gildardo Pounds, NP  metoCLOPramide (REGLAN) 10 MG tablet  Take 1 tablet (10 mg total) by mouth every 6 (six) hours as needed for nausea or vomiting. 07/21/21   Molpus, Jenny Reichmann, MD  nitrofurantoin, macrocrystal-monohydrate, (MACROBID) 100 MG capsule Take 1 capsule (100 mg total) by mouth 2 (two) times daily. 09/14/21   Dorna Mai, MD  Norgestimate-Ethinyl Estradiol Triphasic (TRI-SPRINTEC) 0.18/0.215/0.25 MG-35 MCG tablet Take 1 tablet by mouth daily. 05/10/21   Gildardo Pounds, NP  ondansetron (ZOFRAN-ODT) 4 MG disintegrating tablet Take 1 tablet (4 mg total) by mouth every 8 (eight) hours as needed  for nausea or vomiting. 07/17/21   Michael Boston, MD    Family History Family History  Problem Relation Age of Onset   Diabetes Maternal Grandmother    Stomach cancer Maternal Grandmother     Social History Social History   Tobacco Use   Smoking status: Never   Smokeless tobacco: Never  Substance Use Topics   Alcohol use: Not Currently   Drug use: Not Currently     Allergies   Patient has no known allergies.   Review of Systems Review of Systems   Physical Exam Triage Vital Signs ED Triage Vitals  Enc Vitals Group     BP 12/19/21 1255 135/81     Pulse Rate 12/19/21 1254 100     Resp 12/19/21 1254 18     Temp 12/19/21 1254 98.9 F (37.2 C)     Temp Source 12/19/21 1254 Oral     SpO2 12/19/21 1254 97 %     Weight --      Height --      Head Circumference --      Peak Flow --      Pain Score 12/19/21 1254 9     Pain Loc --      Pain Edu? --      Excl. in [place]? --    No data found.  Updated Vital Signs BP 135/81   Pulse 100   Temp 98.9 F (37.2 C) (Oral)   Resp 18   SpO2 97%   Visual Acuity Right Eye Distance:   Left Eye Distance:   Bilateral Distance:    Right Eye Near:   Left Eye Near:    Bilateral Near:     Physical Exam   UC Treatments / Results  Labs (all labs ordered are listed, but only abnormal results are displayed) Labs Reviewed - No data to display  EKG   Radiology No results found.  Procedures Procedures (including critical care time)  Medications Ordered in UC Medications - No data to display  Initial Impression / Assessment and Plan / UC Course  I have reviewed the triage vital signs and the nursing notes.  Pertinent labs & imaging results that were available during my care of the patient were reviewed by me and considered in my medical decision making (see chart for details).     *** Final Clinical Impressions(s) / UC Diagnoses   Final diagnoses:  COVID-19     Discharge Instructions      Because of your  positive COVID test and your known history of diabetes, you are considered to be high risk.  I have prescribed you Paxlovid, the antiviral. Please read the attached handout prior to starting Paxlovid to understand its risks, benefits, and indications. You must be out of work for 5 days total, may return after a 5-day quarantine wearing an N95 mask. Should you develop severe shortness of breath, chest pain, or uncontrollable fever, please had to the emergency  room.    ED Prescriptions     Medication Sig Dispense Auth. Provider   nirmatrelvir/ritonavir EUA (PAXLOVID) 20 x 150 MG & 10 x 100MG TABS Take 3 tablets by mouth 2 (two) times daily for 5 days. Patient GFR is 60. Take nirmatrelvir (150 mg) two tablets twice daily for 5 days and ritonavir (100 mg) one tablet twice daily for 5 days. 30 tablet Edy Belt L, [place]      PDMP not reviewed this encounter.

## 2021-12-19 NOTE — Progress Notes (Signed)
 Established Patient Office Visit  Subjective    Patient ID: Melissa Gilmore, female    DOB: 10/18/1995  Age: 26 y.o. MRN: [id number]  CC:  Chief Complaint  Patient presents with   Follow-up    HPI Melissa Gilmore presents for contraception counseling. Patient is wanting an alternative to OCP 2/2 difficulty with compliance.    Outpatient Encounter Medications as of 12/15/2021  Medication Sig   Accu-Chek Softclix Lancets lancets Use to check blood sugar twice daily. Dx E11.65   Blood Glucose Monitoring Suppl (ACCU-CHEK GUIDE ME) w/Device KIT Use to check blood sugar twice daily. Dx E11.65   glucose blood (ACCU-CHEK GUIDE) test strip Use to check blood sugar twice daily. Dx E11.65   HYDROcodone-acetaminophen (NORCO) 5-325 MG tablet Take 1-2 tablets by mouth every 6 (six) hours as needed for severe pain.   insulin glargine (LANTUS SOLOSTAR) 100 UNIT/ML Solostar Pen Inject 20 Units into the skin daily.   Insulin Pen Needle (B-D UF III MINI PEN NEEDLES) 31G X 5 MM MISC Use as instructed. Inject into the skin once daily   liraglutide (VICTOZA) 18 MG/3ML SOPN Inject 1.8 mg into the skin daily.   metoCLOPramide (REGLAN) 10 MG tablet Take 1 tablet (10 mg total) by mouth every 6 (six) hours as needed for nausea or vomiting.   nitrofurantoin, macrocrystal-monohydrate, (MACROBID) 100 MG capsule Take 1 capsule (100 mg total) by mouth 2 (two) times daily.   Norgestimate-Ethinyl Estradiol Triphasic (TRI-SPRINTEC) 0.18/0.215/0.25 MG-35 MCG tablet Take 1 tablet by mouth daily.   ondansetron (ZOFRAN-ODT) 4 MG disintegrating tablet Take 1 tablet (4 mg total) by mouth every 8 (eight) hours as needed for nausea or vomiting.   [EXPIRED] medroxyPROGESTERone (DEPO-PROVERA) injection 150 mg    No facility-administered encounter medications on file as of 12/15/2021.    Past Medical History:  Diagnosis Date   Depression    Diabetes mellitus without complication (HCC)    Dissociative  identity disorder (HCC)    Schizophrenia (HCC)     Past Surgical History:  Procedure Laterality Date   CHOLECYSTECTOMY N/A 07/17/2021   Procedure: SINGLE SITE LAPAROSCOPIC CHOLECYSTECTOMY WITH INTRAOPERATIVE CHOLANGIOGRAM; LIVER BIOPSY;  Surgeon: Gross, Steven, MD;  Location: [facility];  Service: General;  Laterality: N/A;   NO PAST SURGERIES      Family History  Problem Relation Age of Onset   Diabetes Maternal Grandmother    Stomach cancer Maternal Grandmother     Social History   Socioeconomic History   Marital status: Single    Spouse name: Not on file   Number of children: Not on file   Years of education: Not on file   Highest education level: Not on file  Occupational History   Not on file  Tobacco Use   Smoking status: Never   Smokeless tobacco: Never  Substance and Sexual Activity   Alcohol use: Not Currently   Drug use: Not Currently   Sexual activity: Not Currently  Other Topics Concern   Not on file  Social History Narrative   Not on file   Social Determinants of Health   Financial Resource Strain: Not on file  Food Insecurity: Not on file  Transportation Needs: Not on file  Physical Activity: Not on file  Stress: Not on file  Social Connections: Not on file  Intimate Partner Violence: Not on file    Review of Systems  All other systems reviewed and are negative.       Objective      BP (!) 128/90   Pulse 89   Temp 98.1 F (36.7 C)   Resp 16   Wt 223 lb 12.8 oz (101.5 kg)   SpO2 97%   BMI 38.42 kg/m   Physical Exam Vitals and nursing note reviewed.  Constitutional:      General: She is not in acute distress. Cardiovascular:     Rate and Rhythm: Normal rate and regular rhythm.  Pulmonary:     Effort: Pulmonary effort is normal.     Breath sounds: Normal breath sounds.  Abdominal:     Palpations: Abdomen is soft.     Tenderness: There is no abdominal tenderness.  Musculoskeletal:     Right lower leg: No edema.     Left lower leg:  No edema.  Neurological:     General: No focal deficit present.     Mental Status: She is alert and oriented to person, place, and time.         Assessment & Plan:   1. Encounter for initial prescription of injectable contraceptive Patient will switch from OCP to depo-provera. moniotor - medroxyPROGESTERone (DEPO-PROVERA) injection 150 mg  2. Anxiety state Appears stable - continue  3. Insulin-requiring or dependent type II diabetes mellitus (HCC) Present management as per consultant  4. Need for immunization against influenza  - Flu Vaccine QUAD 6mo+IM (Fluarix, Fluzone & Alfiuria Quad PF)    Return in about 3 months (around 03/17/2022) for follow up.   ,  P, MD   

## 2022-01-11 ENCOUNTER — Ambulatory Visit
Admission: RE | Admit: 2022-01-11 | Discharge: 2022-01-11 | Disposition: A | Discharge status: Home / Self Care | Payer: Medicaid Other | Source: Ambulatory Visit | Attending: Emergency Medicine | Admitting: Emergency Medicine

## 2022-01-11 VITALS — BP 137/92 | HR 67 | Temp 98.8°F | Resp 18

## 2022-01-11 DIAGNOSIS — E1165 Type 2 diabetes mellitus with hyperglycemia: Secondary | ICD-10-CM | POA: Diagnosis present

## 2022-01-11 DIAGNOSIS — Z794 Long term (current) use of insulin: Secondary | ICD-10-CM | POA: Diagnosis present

## 2022-01-11 DIAGNOSIS — E119 Type 2 diabetes mellitus without complications: Secondary | ICD-10-CM | POA: Diagnosis present

## 2022-01-11 DIAGNOSIS — N760 Acute vaginitis: Secondary | ICD-10-CM | POA: Insufficient documentation

## 2022-01-11 DIAGNOSIS — I10 Essential (primary) hypertension: Secondary | ICD-10-CM | POA: Diagnosis present

## 2022-01-11 LAB — POCT URINE PREGNANCY: Preg Test, Ur: NEGATIVE

## 2022-01-11 LAB — POCT URINALYSIS DIP (MANUAL ENTRY)
Bilirubin, UA: NEGATIVE
Blood, UA: NEGATIVE
Glucose, UA: NEGATIVE mg/dL
Ketones, POC UA: NEGATIVE mg/dL
Leukocytes, UA: NEGATIVE
Nitrite, UA: NEGATIVE
Protein Ur, POC: 30 mg/dL — AB
Spec Grav, UA: 1.025 (ref 1.010–1.025)
Urobilinogen, UA: 0.2 E.U./dL
pH, UA: 5.5 (ref 5.0–8.0)

## 2022-01-11 LAB — POCT FASTING CBG KUC MANUAL ENTRY: POCT Glucose (KUC): 169 mg/dL — AB (ref 70–99)

## 2022-01-11 NOTE — Discharge Instructions (Addendum)
Your blood sugar is not well controlled which increases your risk of vaginal yeast infections.  Please be sure that you are taking all of your diabetes medications exactly as prescribed, exercising daily and following a low carbohydrate diet.  I believe that your elevated blood sugar and your elevated blood pressure are the most likely causes of your dizziness at this time.  Please follow-up with your primary care provider to discuss this issue further.  The results of your vaginal swab test which screens for BV, yeast, gonorrhea, chlamydia and trichomonas will be made posted to your MyChart account once it is complete.  This typically takes 2 to 4 days.  Please abstain from sexual intercourse of any kind, vaginal, oral or anal, until you have received the results of your STD testing.     If any of your results are abnormal, you will receive a phone call regarding treatment.  Prescriptions, if any are needed, will be provided for you at your pharmacy.     Your urine pregnancy test today is negative.   Our point-of-care analysis of your urine sample today was normal and did not reveal any concern for urinary tract infection.   If you have not had complete resolution of your symptoms after completing any needed treatment, please return for repeat evaluation.   Thank you for visiting urgent care today.  I appreciate the opportunity to participate in your care.

## 2022-01-11 NOTE — ED Triage Notes (Signed)
Pt c/o dizziness and vaginal itching that began about a week ago.   Home interventions: monistat

## 2022-01-11 NOTE — ED Provider Notes (Signed)
[facility]    CSN: [id number] Arrival date & time: 01/11/22  1850    HISTORY   Chief Complaint  Patient presents with   Vaginal Itching   Dizziness   HPI Melissa Gilmore [facility] is a pleasant, 26 y.o. female who presents to urgent care today. Patient complains of dizziness and vaginal itching that began about a week ago.  Patient states she has been using over-the-counter Monistat cream without relief of her itching.  Patient has elevated blood pressure on arrival today, vital signs are otherwise normal.  Patient ambulated independently into the clinic and from her exam room to the bathroom and back without difficulty.  First day of last menstrual period was December 14, 2021, patient was seen by her primary care provider on December 15, 2021 for initiation of injectable contraceptive.  Patient also has a history of poorly controlled insulin-dependent type 2 diabetes, A1c was 8.1 on September 14, 2021, her family medicine MD reported she was at goal.  Patient had an active prescription for Victoza at the time however her prescription expired October 2023 and has not been renewed.  Patient's blood pressure was elevated during her visit in August and it is also elevated today, patient has not been prescribed an ACE or ARB for renal protection in the setting of type 2 diabetes.  The history is provided by the patient.   Past Medical History:  Diagnosis Date   Depression    Diabetes mellitus without complication ([place])    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[place])    Schizophrenia [facility])    Patient Active Problem List   Diagnosis Date Noted   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[place]) 07/17/2021   Schizophrenia ([place]) 07/17/2021   Depression 07/17/2021   Insulin-requiring or dependent type II diabetes mellitus ([place]) 07/17/2021   Body mass index (BMI) 35.0-35.9, adult 07/17/2021   Dyslipidemia 07/17/2021   History of abnormal uterine bleeding 07/17/2021   Acute cholecystitis with chronic  cholecystitis 07/16/2021   Past Surgical History:  Procedure Laterality Date   CHOLECYSTECTOMY N/A 07/17/2021   Procedure: SINGLE SITE LAPAROSCOPIC CHOLECYSTECTOMY WITH INTRAOPERATIVE CHOLANGIOGRAM; LIVER BIOPSY;  Surgeon: Michael Boston, MD;  Location: [facility];  Service: General;  Laterality: N/A;   NO PAST SURGERIES     OB History   No obstetric history on file.    Home Medications    Prior to Admission medications   Medication Sig Start Date End Date Taking? Authorizing Provider  Accu-Chek Softclix Lancets lancets Use to check blood sugar twice daily. Dx E11.65 08/06/20   Charlott Rakes, MD  Blood Glucose Monitoring Suppl (ACCU-CHEK GUIDE ME) w/Device KIT Use to check blood sugar twice daily. Dx E11.65 08/06/20   Charlott Rakes, MD  glucose blood (ACCU-CHEK GUIDE) test strip Use to check blood sugar twice daily. Dx E11.65 08/06/20   Charlott Rakes, MD  HYDROcodone-acetaminophen (NORCO) 5-325 MG tablet Take 1-2 tablets by mouth every 6 (six) hours as needed for severe pain. 07/21/21   Molpus, John, MD  insulin glargine (LANTUS SOLOSTAR) 100 UNIT/ML Solostar Pen Inject 20 Units into the skin daily. 11/08/20 07/16/22  Gildardo Pounds, NP  Insulin Pen Needle (B-D UF III MINI PEN NEEDLES) 31G X 5 MM MISC Use as instructed. Inject into the skin once daily 11/08/20   Gildardo Pounds, NP  liraglutide (VICTOZA) 18 MG/3ML SOPN Inject 1.8 mg into the skin daily. 11/08/20   Gildardo Pounds, NP  metoCLOPramide (REGLAN) 10 MG tablet Take 1 tablet (10 mg total) by mouth  every 6 (six) hours as needed for nausea or vomiting. 07/21/21   Molpus, Jenny Reichmann, MD  nitrofurantoin, macrocrystal-monohydrate, (MACROBID) 100 MG capsule Take 1 capsule (100 mg total) by mouth 2 (two) times daily. 09/14/21   Dorna Mai, MD  Norgestimate-Ethinyl Estradiol Triphasic (TRI-SPRINTEC) 0.18/0.215/0.25 MG-35 MCG tablet Take 1 tablet by mouth daily. 05/10/21   Gildardo Pounds, NP  ondansetron (ZOFRAN-ODT) 4 MG disintegrating tablet Take 1  tablet (4 mg total) by mouth every 8 (eight) hours as needed for nausea or vomiting. 07/17/21   Michael Boston, MD    Family History Family History  Problem Relation Age of Onset   Diabetes Maternal Grandmother    Stomach cancer Maternal Grandmother    Social History Social History   Tobacco Use   Smoking status: Never   Smokeless tobacco: Never  Substance Use Topics   Alcohol use: Not Currently   Drug use: Not Currently   Allergies   Patient has no known allergies.  Review of Systems Review of Systems Pertinent findings revealed after performing a 14 point review of systems has been noted in the history of present illness.  Physical Exam Triage Vital Signs ED Triage Vitals  Enc Vitals Group     BP 12/03/20 0827 (!) 147/82     Pulse Rate 12/03/20 0827 72     Resp 12/03/20 0827 18     Temp 12/03/20 0827 98.3 F (36.8 C)     Temp Source 12/03/20 0827 Oral     SpO2 12/03/20 0827 98 %     Weight --      Height --      Head Circumference --      Peak Flow --      Pain Score 12/03/20 0826 5     Pain Loc --      Pain Edu? --      Excl. in [place]? --   No data found.  Updated Vital Signs BP (!) 137/92 (BP Location: Left Arm)   Pulse 67   Temp 98.8 F (37.1 C) (Oral)   Resp 18   LMP 12/14/2021   SpO2 99%   Physical Exam Vitals and nursing note reviewed.  Constitutional:      General: She is not in acute distress.    Appearance: Normal appearance. She is not ill-appearing.  HENT:     Head: Normocephalic and atraumatic.  Eyes:     General: Lids are normal.        Right eye: No discharge.        Left eye: No discharge.     Extraocular Movements: Extraocular movements intact.     Conjunctiva/sclera: Conjunctivae normal.     Right eye: Right conjunctiva is not injected.     Left eye: Left conjunctiva is not injected.  Neck:     Trachea: Trachea and phonation normal.  Cardiovascular:     Rate and Rhythm: Normal rate and regular rhythm.     Pulses: Normal pulses.      Heart sounds: Normal heart sounds. No murmur heard.    No friction rub. No gallop.  Pulmonary:     Effort: Pulmonary effort is normal. No accessory muscle usage, prolonged expiration or respiratory distress.     Breath sounds: Normal breath sounds. No stridor, decreased air movement or transmitted upper airway sounds. No decreased breath sounds, wheezing, rhonchi or rales.  Chest:     Chest wall: No tenderness.  Genitourinary:    Comments: Patient politely declines pelvic exam today,  patient provided a vaginal swab for testing. Musculoskeletal:        General: Normal range of motion.     Cervical back: Normal range of motion and neck supple. Normal range of motion.  Lymphadenopathy:     Cervical: No cervical adenopathy.  Skin:    General: Skin is warm and dry.     Findings: No erythema or rash.  Neurological:     General: No focal deficit present.     Mental Status: She is alert and oriented to person, place, and time.  Psychiatric:        Mood and Affect: Mood normal.        Behavior: Behavior normal.     Visual Acuity Right Eye Distance:   Left Eye Distance:   Bilateral Distance:    Right Eye Near:   Left Eye Near:    Bilateral Near:     UC Couse / Diagnostics / Procedures:     Radiology No results found.  Procedures Procedures (including critical care time) EKG  Pending results:  Labs Reviewed  POCT URINALYSIS DIP (MANUAL ENTRY) - Abnormal; Notable for the following components:      Result Value   Clarity, UA cloudy (*)    Protein Ur, POC =30 (*)    All other components within normal limits  POCT FASTING CBG [facility] MANUAL ENTRY - Abnormal; Notable for the following components:   POCT Glucose ([facility]) 169 (*)    All other components within normal limits  POCT URINE PREGNANCY  CERVICOVAGINAL ANCILLARY ONLY    Medications Ordered in UC: Medications - No data to display  UC Diagnoses / Final Clinical Impressions(s)   I have reviewed the triage vital signs  and the nursing notes.  Pertinent labs & imaging results that were available during my care of the patient were reviewed by me and considered in my medical decision making (see chart for details).    Final diagnoses:  Acute vaginitis  Insulin dependent type 2 diabetes mellitus ([place])  Uncontrolled type 2 diabetes mellitus with hyperglycemia, with long-term current use of insulin (HCC)  Essential hypertension   CBG was 169.  Blood pressure 137/92.  Patient is not currently taking ACE/ARB for renal protection type 2 diabetes.  Type 2 diabetes is not well controlled, last A1c 8.1, patient no longer taking Victoza.  Patient advised to follow-up with PCP regarding dizziness. STD screening was performed, patient advised that the results be posted to their MyChart and if any of the results are positive, they will be notified by phone, further treatment will be provided as indicated based on results of STD screening. Patient was advised to abstain from sexual intercourse until that they receive the results of their STD testing.  Patient was also advised to use condoms to protect themselves from STD exposure. Urinalysis today was normal. Urine pregnancy test was negative. Return precautions advised.     ED Prescriptions   None    PDMP not reviewed this encounter.  Disposition Upon Discharge:  Condition: stable for discharge home  Patient presented with concern for an acute illness with associated systemic symptoms and significant discomfort requiring urgent management. In my opinion, this is a condition that a prudent lay person (someone who possesses an average knowledge of health and medicine) may potentially expect to result in complications if not addressed urgently such as respiratory distress, impairment of bodily function or dysfunction of bodily organs.   As such, the patient has been evaluated and assessed, work-up was  performed and treatment was provided in alignment with urgent care  protocols and evidence based medicine.  Patient/parent/caregiver has been advised that the patient may require follow up for further testing and/or treatment if the symptoms continue in spite of treatment, as clinically indicated and appropriate.  Routine symptom specific, illness specific and/or disease specific instructions were discussed with the patient and/or caregiver at length.  Prevention strategies for avoiding STD exposure were also discussed.  The patient will follow up with their current PCP if and as advised. If the patient does not currently have a PCP we will assist them in obtaining one.   The patient may need specialty follow up if the symptoms continue, in spite of conservative treatment and management, for further workup, evaluation, consultation and treatment as clinically indicated and appropriate.  Patient/parent/caregiver verbalized understanding and agreement of plan as discussed.  All questions were addressed during visit.  Please see discharge instructions below for further details of plan.  Discharge Instructions:   Discharge Instructions      Your blood sugar is not well controlled which increases your risk of vaginal yeast infections.  Please be sure that you are taking all of your diabetes medications exactly as prescribed, exercising daily and following a low carbohydrate diet.  I believe that your elevated blood sugar and your elevated blood pressure are the most likely causes of your dizziness at this time.  Please follow-up with your primary care provider to discuss this issue further.  The results of your vaginal swab test which screens for BV, yeast, gonorrhea, chlamydia and trichomonas will be made posted to your MyChart account once it is complete.  This typically takes 2 to 4 days.  Please abstain from sexual intercourse of any kind, vaginal, oral or anal, until you have received the results of your STD testing.     If any of your results are abnormal, you  will receive a phone call regarding treatment.  Prescriptions, if any are needed, will be provided for you at your pharmacy.     Your urine pregnancy test today is negative.   Our point-of-care analysis of your urine sample today was normal and did not reveal any concern for urinary tract infection.   If you have not had complete resolution of your symptoms after completing any needed treatment, please return for repeat evaluation.   Thank you for visiting urgent care today.  I appreciate the opportunity to participate in your care.       This office note has been dictated using Museum/gallery curator.  Unfortunately, this method of dictation can sometimes lead to typographical or grammatical errors.  I apologize for your inconvenience in advance if this occurs.  Please do not hesitate to reach out to me if clarification is needed.       Lynden Oxford Scales, PA-C 01/11/22 2008

## 2022-01-12 ENCOUNTER — Ambulatory Visit (HOSPITAL_COMMUNITY): Payer: Medicaid Other

## 2022-01-13 ENCOUNTER — Telehealth: Payer: Self-pay | Admitting: Emergency Medicine

## 2022-01-13 LAB — CERVICOVAGINAL ANCILLARY ONLY
Bacterial Vaginitis (gardnerella): NEGATIVE
Candida Glabrata: NEGATIVE
Candida Vaginitis: POSITIVE — AB
Chlamydia: NEGATIVE
Comment: NEGATIVE
Comment: NEGATIVE
Comment: NEGATIVE
Comment: NEGATIVE
Comment: NEGATIVE
Comment: NORMAL
Neisseria Gonorrhea: NEGATIVE
Trichomonas: NEGATIVE

## 2022-01-13 MED ORDER — FLUCONAZOLE 200 MG PO TABS: 200.0000 mg | ORAL_TABLET | Freq: Every day | ORAL | 0 refills | Status: AC

## 2022-02-11 ENCOUNTER — Telehealth: Payer: Medicaid Other | Admitting: Family Medicine

## 2022-02-11 DIAGNOSIS — B3731 Acute candidiasis of vulva and vagina: Secondary | ICD-10-CM

## 2022-02-11 MED ORDER — FLUCONAZOLE 150 MG PO TABS: 150.0000 mg | ORAL_TABLET | Freq: Once | ORAL | 0 refills | Status: AC

## 2022-02-11 NOTE — Patient Instructions (Signed)

## 2022-02-11 NOTE — Progress Notes (Signed)
Virtual Visit Consent   Melissa Gilmore, you are scheduled for a virtual visit with a [facility] provider today. Just as with appointments in the office, your consent must be obtained to participate. Your consent will be active for this visit and any virtual visit you may have with one of our providers in the next 365 days. If you have a MyChart account, a copy of this consent can be sent to you electronically.  As this is a virtual visit, video technology does not allow for your provider to perform a traditional examination. This may limit your provider's ability to fully assess your condition. If your provider identifies any concerns that need to be evaluated in person or the need to arrange testing (such as labs, EKG, etc.), we will make arrangements to do so. Although advances in technology are sophisticated, we cannot ensure that it will always work on either your end or our end. If the connection with a video visit is poor, the visit may have to be switched to a telephone visit. With either a video or telephone visit, we are not always able to ensure that we have a secure connection.  By engaging in this virtual visit, you consent to the provision of healthcare and authorize for your insurance to be billed (if applicable) for the services provided during this visit. Depending on your insurance coverage, you may receive a charge related to this service.  I need to obtain your verbal consent now. Are you willing to proceed with your visit today? Melissa Gilmore has provided verbal consent on 02/11/2022 for a virtual visit (video or telephone). Melissa Curio, FNP  Date: 02/11/2022 8:02 AM  Virtual Visit via Video Note   I, Melissa Gilmore, connected with  Melissa Gilmore [place]  ([id number], 1995-10-25) on 02/11/22 at  8:00 AM EST by a video-enabled telemedicine application and verified that I am speaking with the correct person using two identifiers.  Location: Patient: Virtual  Visit Location Patient: Home Provider: Virtual Visit Location Provider: Home Office   I discussed the limitations of evaluation and management by telemedicine and the availability of in person appointments. The patient expressed understanding and agreed to proceed.    History of Present Illness: Melissa Gilmore is a 27 y.o. who identifies as a female who was assigned female at birth, and is being seen today for thick white discharge and itching vaginally. No odor. No abd pain or fever. Marland Kitchen  HPI: HPI  Problems:  Patient Active Problem List   Diagnosis Date Noted   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/2021   Schizophrenia (HCC) 07/17/2021   Depression 07/17/2021   Insulin-requiring or dependent type II diabetes mellitus (HCC) 07/17/2021   Body mass index (BMI) 35.0-35.9, adult 07/17/2021   Dyslipidemia 07/17/2021   History of abnormal uterine bleeding 07/17/2021   Acute cholecystitis with chronic cholecystitis 07/16/2021    Allergies: No Known Allergies Medications:  Current Outpatient Medications:    fluconazole (DIFLUCAN) 150 MG tablet, Take 1 tablet (150 mg total) by mouth once for 1 dose., Disp: 1 tablet, Rfl: 0   Accu-Chek Softclix Lancets lancets, Use to check blood sugar twice daily. Dx E11.65, Disp: 100 each, Rfl: 3   Blood Glucose Monitoring Suppl (ACCU-CHEK GUIDE ME) w/Device KIT, Use to check blood sugar twice daily. Dx E11.65, Disp: 1 kit, Rfl: 0   glucose blood (ACCU-CHEK GUIDE) test strip, Use to check blood sugar twice daily. Dx E11.65, Disp: 100 each, Rfl: 12  insulin glargine (LANTUS SOLOSTAR) 100 UNIT/ML Solostar Pen, Inject 20 Units into the skin daily., Disp: 18 mL, Rfl: 1   Insulin Pen Needle (B-D UF III MINI PEN NEEDLES) 31G X 5 MM MISC, Use as instructed. Inject into the skin once daily, Disp: 100 each, Rfl: 3   liraglutide (VICTOZA) 18 MG/3ML SOPN, Inject 1.8 mg into the skin daily., Disp: 9 mL, Rfl: 2   metoCLOPramide (REGLAN) 10 MG tablet, Take 1  tablet (10 mg total) by mouth every 6 (six) hours as needed for nausea or vomiting., Disp: 12 tablet, Rfl: 0   Norgestimate-Ethinyl Estradiol Triphasic (TRI-SPRINTEC) 0.18/0.215/0.25 MG-35 MCG tablet, Take 1 tablet by mouth daily., Disp: 84 tablet, Rfl: 3   ondansetron (ZOFRAN-ODT) 4 MG disintegrating tablet, Take 1 tablet (4 mg total) by mouth every 8 (eight) hours as needed for nausea or vomiting., Disp: 5 tablet, Rfl: 2  Observations/Objective: Patient is well-developed, well-nourished in no acute distress.  Resting comfortably  at home.  Head is normocephalic, atraumatic.  No labored breathing.  Speech is clear and coherent with logical content.  Patient is alert and oriented at baseline.    Assessment and Plan: 1. Candidiasis, vagina  Stay clean cool and dry, follow up with pcp as needed.   Follow Up Instructions: I discussed the assessment and treatment plan with the patient. The patient was provided an opportunity to ask questions and all were answered. The patient agreed with the plan and demonstrated an understanding of the instructions.  A copy of instructions were sent to the patient via MyChart unless otherwise noted below.     The patient was advised to call back or seek an in-person evaluation if the symptoms worsen or if the condition fails to improve as anticipated.  Time:  I spent 6 minutes with the patient via telehealth technology discussing the above problems/concerns.    Dellia Nims, FNP

## 2022-03-14 ENCOUNTER — Encounter (HOSPITAL_COMMUNITY): Payer: Self-pay

## 2022-03-14 ENCOUNTER — Emergency Department (HOSPITAL_COMMUNITY): Payer: Medicaid Other

## 2022-03-14 ENCOUNTER — Emergency Department (HOSPITAL_COMMUNITY)
Admission: EM | Admit: 2022-03-14 | Discharge: 2022-03-14 | Disposition: A | Discharge status: Home / Self Care | Payer: Medicaid Other | Attending: Emergency Medicine | Admitting: Emergency Medicine

## 2022-03-14 ENCOUNTER — Ambulatory Visit: Payer: Self-pay

## 2022-03-14 ENCOUNTER — Ambulatory Visit: Payer: Medicaid Other | Admitting: Family Medicine

## 2022-03-14 ENCOUNTER — Other Ambulatory Visit: Payer: Self-pay

## 2022-03-14 DIAGNOSIS — N3000 Acute cystitis without hematuria: Secondary | ICD-10-CM | POA: Diagnosis not present

## 2022-03-14 DIAGNOSIS — M545 Low back pain, unspecified: Secondary | ICD-10-CM | POA: Insufficient documentation

## 2022-03-14 DIAGNOSIS — E119 Type 2 diabetes mellitus without complications: Secondary | ICD-10-CM | POA: Diagnosis not present

## 2022-03-14 DIAGNOSIS — R55 Syncope and collapse: Secondary | ICD-10-CM | POA: Insufficient documentation

## 2022-03-14 DIAGNOSIS — Z794 Long term (current) use of insulin: Secondary | ICD-10-CM | POA: Insufficient documentation

## 2022-03-14 DIAGNOSIS — R42 Dizziness and giddiness: Secondary | ICD-10-CM | POA: Diagnosis present

## 2022-03-14 LAB — CBC WITH DIFFERENTIAL/PLATELET
Abs Immature Granulocytes: 0.03 10*3/uL (ref 0.00–0.07)
Basophils Absolute: 0 10*3/uL (ref 0.0–0.1)
Basophils Relative: 0 %
Eosinophils Absolute: 0.1 10*3/uL (ref 0.0–0.5)
Eosinophils Relative: 1 %
HCT: 40.6 % (ref 36.0–46.0)
Hemoglobin: 13.9 g/dL (ref 12.0–15.0)
Immature Granulocytes: 0 %
Lymphocytes Relative: 38 %
Lymphs Abs: 2.8 10*3/uL (ref 0.7–4.0)
MCH: 29 pg (ref 26.0–34.0)
MCHC: 34.2 g/dL (ref 30.0–36.0)
MCV: 84.6 fL (ref 80.0–100.0)
Monocytes Absolute: 0.4 10*3/uL (ref 0.1–1.0)
Monocytes Relative: 5 %
Neutro Abs: 4.2 10*3/uL (ref 1.7–7.7)
Neutrophils Relative %: 56 %
Platelets: 265 10*3/uL (ref 150–400)
RBC: 4.8 MIL/uL (ref 3.87–5.11)
RDW: 12.2 % (ref 11.5–15.5)
WBC: 7.6 10*3/uL (ref 4.0–10.5)
nRBC: 0 % (ref 0.0–0.2)

## 2022-03-14 LAB — COMPREHENSIVE METABOLIC PANEL
ALT: 25 U/L (ref 0–44)
AST: 21 U/L (ref 15–41)
Albumin: 4.2 g/dL (ref 3.5–5.0)
Alkaline Phosphatase: 70 U/L (ref 38–126)
Anion gap: 9 (ref 5–15)
BUN: 9 mg/dL (ref 6–20)
CO2: 23 mmol/L (ref 22–32)
Calcium: 8.9 mg/dL (ref 8.9–10.3)
Chloride: 104 mmol/L (ref 98–111)
Creatinine, Ser: 0.62 mg/dL (ref 0.44–1.00)
GFR, Estimated: >60 mL/min (ref >60)
Glucose, Bld: 262 mg/dL — ABNORMAL HIGH (ref 70–99)
Potassium: 3.7 mmol/L (ref 3.5–5.1)
Sodium: 136 mmol/L (ref 135–145)
Total Bilirubin: 0.6 mg/dL (ref 0.3–1.2)
Total Protein: 7.5 g/dL (ref 6.5–8.1)

## 2022-03-14 LAB — URINALYSIS, ROUTINE W REFLEX MICROSCOPIC
Bilirubin Urine: NEGATIVE
Glucose, UA: >=500 mg/dL — AB
Hgb urine dipstick: NEGATIVE
Ketones, ur: 5 mg/dL — AB
Nitrite: NEGATIVE
Protein, ur: NEGATIVE mg/dL
Specific Gravity, Urine: 1.039 — ABNORMAL HIGH (ref 1.005–1.030)
pH: 7 (ref 5.0–8.0)

## 2022-03-14 LAB — TROPONIN I (HIGH SENSITIVITY)
Troponin I (High Sensitivity): <2 ng/L (ref <18)
Troponin I (High Sensitivity): <2 ng/L (ref <18)

## 2022-03-14 LAB — I-STAT BETA HCG BLOOD, ED (MC, WL, AP ONLY): I-stat hCG, quantitative: <5 m[IU]/mL (ref <5)

## 2022-03-14 LAB — LIPASE, BLOOD: Lipase: 41 U/L (ref 11–51)

## 2022-03-14 LAB — PREGNANCY, URINE: Preg Test, Ur: NEGATIVE

## 2022-03-14 MED ORDER — SULFAMETHOXAZOLE-TRIMETHOPRIM 800-160 MG PO TABS: 1.0000 | ORAL_TABLET | Freq: Two times a day (BID) | ORAL | 0 refills | Status: AC

## 2022-03-14 MED ORDER — CYCLOBENZAPRINE HCL 10 MG PO TABS
10.0000 mg | ORAL_TABLET | Freq: Once | ORAL | Status: AC
  Administered 2022-03-14: 10 mg via ORAL
  Filled 2022-03-14: qty 1

## 2022-03-14 MED ORDER — SODIUM CHLORIDE 0.9 % IV BOLUS
1000.0000 mL | Freq: Once | INTRAVENOUS | Status: AC
  Administered 2022-03-14: 1000 mL via INTRAVENOUS

## 2022-03-14 MED ORDER — KETOROLAC TROMETHAMINE 30 MG/ML IJ SOLN
30.0000 mg | Freq: Once | INTRAMUSCULAR | Status: AC
  Administered 2022-03-14: 30 mg via INTRAVENOUS
  Filled 2022-03-14: qty 1

## 2022-03-14 MED ORDER — ONDANSETRON HCL 4 MG/2ML IJ SOLN
4.0000 mg | Freq: Once | INTRAMUSCULAR | Status: AC
  Administered 2022-03-14: 4 mg via INTRAVENOUS
  Filled 2022-03-14: qty 2

## 2022-03-14 MED ORDER — IOHEXOL 300 MG/ML  SOLN
100.0000 mL | Freq: Once | INTRAMUSCULAR | Status: AC | PRN
  Administered 2022-03-14: 100 mL via INTRAVENOUS

## 2022-03-14 MED ORDER — LIDOCAINE 5 % EX PTCH
1.0000 | MEDICATED_PATCH | CUTANEOUS | Status: DC
  Administered 2022-03-14: 1 via TRANSDERMAL
  Filled 2022-03-14: qty 1

## 2022-03-14 MED ORDER — ONDANSETRON HCL 4 MG PO TABS: 4.0000 mg | ORAL_TABLET | Freq: Three times a day (TID) | ORAL | 0 refills | Status: DC | PRN

## 2022-03-14 MED ORDER — LIDOCAINE 5 % EX PTCH: 1.0000 | MEDICATED_PATCH | Freq: Two times a day (BID) | CUTANEOUS | 0 refills | Status: DC

## 2022-03-14 MED ORDER — CYCLOBENZAPRINE HCL 10 MG PO TABS: 10.0000 mg | ORAL_TABLET | Freq: Two times a day (BID) | ORAL | 0 refills | Status: DC | PRN

## 2022-03-14 NOTE — ED Provider Notes (Signed)
[facility] Provider Note   CSN: [id number] Arrival date & time: 03/14/22  1300     History  Chief Complaint  Patient presents with   Abdominal Pain   Dizziness    Melissa Gilmore is a 27 y.o. female with PMH diabetes, depression, schizophrenia, DID presenting with 4 days of worsening back pain, abdominal pain, dizziness, and syncope starting this morning.  She states that she does have chronic back pain but that has gotten worse over the past few days.  Her pain is localized over her spine and the lumbosacral area and radiates across her flanks to her suprapubic abdomen.  She did not have any preceding trauma or other notable precipitating events.  She says her pain started suddenly and is worse when she lays flat or sits for long periods of time.  She had a PCP visit today but came to the ED because she said the pain was unbearable.  She has associated nausea but has not had any vomiting.  This morning after coming to the ED, she also started having episodes of syncope.  She states that before she passes out she feels warmth and flushing.  Her boyfriend is with her and states that this has happened approximately 10-15 times since they came to the ED this morning.  She has not had any head trauma with syncope.  She denies any dysuria, changes to urinary frequency, hematuria, vaginal discharge, diarrhea, fevers or chills, melena or other stool changes, dyspnea, chest pain.  She takes Lantus and Victoza at home for diabetes but denies taking any other medicines.  She did have multiple episodes of syncope during my evaluation which were precipitated by movement or changes in position.  When this happened, her had mild to one side.  She did not have any ear myoclonus, tongue biting, bowel or bladder incontinence or tonic posturing.  She would still respond to questions, specifically endorsing tenderness to palpation and abdomen or back.  After a  few seconds, she would regain baseline alertness.  Home Medications Prior to Admission medications   Medication Sig Start Date End Date Taking? Authorizing Provider  cyclobenzaprine (FLEXERIL) 10 MG tablet Take 1 tablet (10 mg total) by mouth 2 (two) times daily as needed for muscle spasms. 03/14/22  Yes Linus Galas, MD  lidocaine (LIDODERM) 5 % Place 1 patch onto the skin every 12 (twelve) hours. Remove & Discard patch within 12 hours or as directed by MD 03/14/22 03/14/23 Yes Linus Galas, MD  ondansetron (ZOFRAN) 4 MG tablet Take 1 tablet (4 mg total) by mouth every 8 (eight) hours as needed for nausea or vomiting. 03/14/22 03/14/23 Yes Linus Galas, MD  sulfamethoxazole-trimethoprim (BACTRIM DS) 800-160 MG tablet Take 1 tablet by mouth 2 (two) times daily for 3 days. 03/14/22 03/17/22 Yes Linus Galas, MD  Accu-Chek Softclix Lancets lancets Use to check blood sugar twice daily. Dx E11.65 08/06/20   Charlott Rakes, MD  Blood Glucose Monitoring Suppl (ACCU-CHEK GUIDE ME) w/Device KIT Use to check blood sugar twice daily. Dx E11.65 08/06/20   Charlott Rakes, MD  glucose blood (ACCU-CHEK GUIDE) test strip Use to check blood sugar twice daily. Dx E11.65 08/06/20   Charlott Rakes, MD  insulin glargine (LANTUS SOLOSTAR) 100 UNIT/ML Solostar Pen Inject 20 Units into the skin daily. 11/08/20 07/16/22  Gildardo Pounds, NP  Insulin Pen Needle (B-D UF III MINI PEN NEEDLES) 31G X 5 MM MISC Use as instructed. Inject into the  skin once daily 11/08/20   Gildardo Pounds, NP  liraglutide (VICTOZA) 18 MG/3ML SOPN Inject 1.8 mg into the skin daily. 11/08/20   Gildardo Pounds, NP  metoCLOPramide (REGLAN) 10 MG tablet Take 1 tablet (10 mg total) by mouth every 6 (six) hours as needed for nausea or vomiting. 07/21/21   Molpus, Jenny Reichmann, MD  Norgestimate-Ethinyl Estradiol Triphasic (TRI-SPRINTEC) 0.18/0.215/0.25 MG-35 MCG tablet Take 1 tablet by mouth daily. 05/10/21   Gildardo Pounds, NP       Allergies    Patient has no known allergies.    Review of Systems   See HPI  Physical Exam Updated Vital Signs BP (!) 140/115   Pulse 92   Temp 98 F (36.7 C) (Oral)   Resp (!) 28   Ht 5\' 4"  (1.626 m)   Wt 99.8 kg   SpO2 100%   BMI 37.76 kg/m  Physical Exam Constitutional:      General: She is not in acute distress. HENT:     Head: Normocephalic and atraumatic.  Eyes:     Extraocular Movements: Extraocular movements intact.     Pupils: Pupils are equal, round, and reactive to light.  Cardiovascular:     Rate and Rhythm: Normal rate and regular rhythm.     Heart sounds: Normal heart sounds.  Pulmonary:     Effort: Pulmonary effort is normal. No respiratory distress.     Breath sounds: Normal breath sounds.  Abdominal:     General: There is no distension.     Palpations: Abdomen is soft.     Tenderness: There is abdominal tenderness in the suprapubic area. There is no right CVA tenderness or left CVA tenderness.  Musculoskeletal:     Lumbar back: Tenderness present.       Back:  Neurological:     General: No focal deficit present.     Mental Status: She is alert.  Psychiatric:        Mood and Affect: Mood is anxious.     ED Results / Procedures / Treatments   Labs (all labs ordered are listed, but only abnormal results are displayed) Labs Reviewed  COMPREHENSIVE METABOLIC PANEL - Abnormal; Notable for the following components:      Result Value   Glucose, Bld 262 (*)    All other components within normal limits  URINALYSIS, ROUTINE W REFLEX MICROSCOPIC - Abnormal; Notable for the following components:   Specific Gravity, Urine 1.039 (*)    Glucose, UA >=500 (*)    Ketones, ur 5 (*)    Leukocytes,Ua LARGE (*)    Bacteria, UA RARE (*)    All other components within normal limits  CBC WITH DIFFERENTIAL/PLATELET  LIPASE, BLOOD  PREGNANCY, URINE  I-STAT BETA HCG BLOOD, ED ([facility], [facility], [facility] ONLY)  TROPONIN I (HIGH SENSITIVITY)  TROPONIN I (HIGH SENSITIVITY)     EKG EKG Interpretation  Date/Time:  Tuesday March 14 2022 13:40:44 EST Ventricular Rate:  69 PR Interval:  165 QRS Duration: 87 QT Interval:  378 QTC Calculation: 405 R Axis:   75 Text Interpretation: Sinus rhythm No significant change since last tracing Confirmed by Wandra Arthurs [phone]) on 03/14/2022 5:18:42 PM  Radiology CT HEAD WO CONTRAST (5MM)  Result Date: 03/14/2022 CLINICAL DATA:  Mental status change of unknown cause. Dizziness over the last 3 days. EXAM: CT HEAD WITHOUT CONTRAST TECHNIQUE: Contiguous axial images were obtained from the base of the skull through the vertex without intravenous contrast. RADIATION DOSE REDUCTION: This  exam was performed according to the departmental dose-optimization program which includes automated exposure control, adjustment of the mA and/or kV according to patient size and/or use of iterative reconstruction technique. COMPARISON:  None Available. FINDINGS: Brain: The brain shows a normal appearance without evidence of malformation, atrophy, old or acute small or large vessel infarction, mass lesion, hemorrhage, hydrocephalus or extra-axial collection. Vascular: No hyperdense vessel. No evidence of atherosclerotic calcification. Skull: Normal.  No traumatic finding.  No focal bone lesion. Sinuses/Orbits: Sinuses are clear. Orbits appear normal. Mastoids are clear. Other: None significant IMPRESSION: Normal head CT. Electronically Signed   By: Nelson Chimes M.D.   On: 03/14/2022 18:42   CT ABDOMEN PELVIS W CONTRAST  Result Date: 03/14/2022 CLINICAL DATA:  Lower back and abdominal pain, dizziness x3 days. EXAM: CT ABDOMEN AND PELVIS WITH CONTRAST TECHNIQUE: Multidetector CT imaging of the abdomen and pelvis was performed using the standard protocol following bolus administration of intravenous contrast. RADIATION DOSE REDUCTION: This exam was performed according to the departmental dose-optimization program which includes automated exposure control,  adjustment of the mA and/or kV according to patient size and/or use of iterative reconstruction technique. CONTRAST:  137mL OMNIPAQUE IOHEXOL 300 MG/ML  SOLN COMPARISON:  Multiple priors including most recent CT July 21, 2021. FINDINGS: Lower chest: Hypoventilatory change in the dependent lungs. Hepatobiliary: No suspicious hepatic lesion. Gallbladder surgically absent. No biliary ductal dilation. Pancreas: No pancreatic ductal dilation or evidence of acute inflammation. Spleen: No splenomegaly. Adrenals/Urinary Tract: Bilateral adrenal glands appear normal. No hydronephrosis. Kidneys demonstrate symmetric enhancement. Urinary bladder is unremarkable for degree of distension. Stomach/Bowel: Stomach is unremarkable for degree of distension. No pathologic dilation of small or large bowel. Normal appendix and terminal ileum. No evidence of acute bowel inflammation. Vascular/Lymphatic: Normal caliber abdominal aorta. Smooth IVC contours. No pathologically enlarged abdominal or pelvic lymph nodes. Reproductive: Uterus and bilateral adnexa are unremarkable. Other: No significant abdominopelvic free fluid Musculoskeletal: No acute osseous abnormality IMPRESSION: 1. No acute abnormality identified in the abdomen or pelvis. Electronically Signed   By: Dahlia Bailiff M.D.   On: 03/14/2022 18:39    Procedures Procedures    Medications Ordered in ED Medications  lidocaine (LIDODERM) 5 % 1 patch (1 patch Transdermal Patch Applied 03/14/22 2029)  sodium chloride 0.9 % bolus 1,000 mL (0 mLs Intravenous Stopped 03/14/22 1927)  ketorolac (TORADOL) 30 MG/ML injection 30 mg (30 mg Intravenous Given 03/14/22 1733)  ondansetron (ZOFRAN) injection 4 mg (4 mg Intravenous Given 03/14/22 1732)  iohexol (OMNIPAQUE) 300 MG/ML solution 100 mL (100 mLs Intravenous Contrast Given 03/14/22 1805)  cyclobenzaprine (FLEXERIL) tablet 10 mg (10 mg Oral Given 03/14/22 2029)    ED Course/ Medical Decision Making/ A&P    Medical Decision  Making Amount and/or Complexity of Data Reviewed Radiology: ordered. ECG/medicine tests: ordered.  Risk Prescription drug management.   27 year old past medical history of diabetes, depression, schizophrenia, DID presenting with 4 days of back pain radiating to suprapubic abdomen, nausea, syncope starting this morning.  She has suprapubic tenderness and lumbosacral paraspinal tenderness on exam and also had multiple syncopal episodes during evaluation.  Initial DDx for back/abdominal pain is broad and includes musculoskeletal pain, pancreatitis, nephrolithiasis, UTI or pyelonephritis.  She has had a prior presentation with vaginal candidiasis, but low concern for STI or vaginal candidiasis at this point given lack of vaginal discharge or other symptoms.  New onset syncope sounds vasovagal based off of history, possibly psychogenic given frequency of episodes during evaluation, presentation, and extensive untreated psychiatric  history.    Update: Patient symptomatically feels better after initial fluid resuscitation and treatment of pain with Toradol, Flexeril, lidocaine patch.  CT abdomen was negative for any clear etiologies.  Believe her back pain is likely musculoskeletal in nature.  Of note, she does have some leukocytes and rare bacteria in urine, but low suspicion of pyelonephritis at this time with negative CT and no CVA tenderness on exam.  Discussed UA findings with her and we will opt to go ahead and treat with short course of bactrim at this time.  As discussed above, believe her syncope is likely vasovagal or psychogenic in nature.  No cardiac abnormalities on EKG during evaluation here and no cardiac symptoms to suggest further immediate workup.  Low suspicion for stroke with no residual neurological deficits or seizure without clear seizure-like activity during episodes or postictal state.  She might benefit from cardiac monitoring in the outpatient setting and further outpatient  evaluation if she continues to have further episodes of syncope after symptomatic improvement.  Discussed these options with her and she is amenable for discharge and further outpatient follow-up.  Of note, she is hyperglycemic but without Acidosis, can continue working on diabetes treatment in the outpatient setting.    Final Clinical Impression(s) / ED Diagnoses Final diagnoses:  Vasovagal syncope  Acute cystitis without hematuria  Lumbar back pain    Rx / DC Orders ED Discharge Orders          Ordered    sulfamethoxazole-trimethoprim (BACTRIM DS) 800-160 MG tablet  2 times daily        03/14/22 2142    ondansetron (ZOFRAN) 4 MG tablet  Every 8 hours PRN        03/14/22 2142    cyclobenzaprine (FLEXERIL) 10 MG tablet  2 times daily PRN        03/14/22 2142    lidocaine (LIDODERM) 5 %  Every 12 hours        03/14/22 2142              Linus Galas, MD 03/14/22 2146    Wynona Dove A, DO 04/19/22 1507

## 2022-03-14 NOTE — ED Provider Triage Note (Signed)
Emergency Medicine Provider Triage Evaluation Note  Melissa Gilmore [facility] , a 27 y.o. female  was evaluated in triage.  Pt complains of lower back pain that radiates around to bilateral lower abdomen for the last week.  Patient states that she has a longstanding chronic history of lower back pain that has never been evaluated before.  She denies any known injury to the back or any fall.  States that she had an appointment today with her PCP for evaluation of the back pain but with her new symptoms she came to the ED to be evaluated instead.  New symptoms are associated with nausea and lightheadedness that started 2 days ago.  Patient states that she constantly feels lightheaded like she is going to pass out.  States that she has been tolerating p.o. intake well.  No vomiting, diarrhea, fever, chills, urinary symptoms, vaginal bleeding or discharge.  States that once in the past she had similar symptoms, however, at that time she was having a lot of vaginal bleeding and symptoms were attributed to anemia..  Review of Systems  Positive: See HPI Negative: See HPI  Physical Exam  BP (!) 139/103   Pulse 70   Temp 98.5 F (36.9 C) (Oral)   Resp 16   Ht '5\' 4"'$  (1.626 m)   Wt 99.8 kg   SpO2 100%   BMI 37.76 kg/m  Gen:   Awake, no distress   Resp:  Normal effort lungs clear to auscultation MSK:   Moves extremities without difficulty no lower extremity edema Other:  Mild diffuse abdominal tenderness, no rebound or guarding, abdomen is soft, no CVA tenderness  Medical Decision Making  Medically screening exam initiated at 1:38 PM.  Appropriate orders placed.  [name] [facility] was informed that the remainder of the evaluation will be completed by another provider, this initial triage assessment does not replace that evaluation, and the importance of remaining in the ED until their evaluation is complete.     [name], PA-C 03/14/22 1340

## 2022-03-14 NOTE — Telephone Encounter (Signed)
    Chief Complaint: Dizzy, nausea, abdominal pain. "I'm going to THE ED, I want the OV cancelled." Symptoms: Above Frequency: 3 days ago Pertinent Negatives: Patient denies fever Disposition: [x] ED /[] Urgent Care (no appt availability in office) / [] Appointment(In office/virtual)/ []  [facility]/ [] Home Care/ [] Refused Recommended Disposition /[] [facility]/ [x]  Follow-up with PCP Additional Notes:   Reason for Disposition  [1] MILD-MODERATE pain AND [2] constant AND [3] present > 2 hours  Answer Assessment - Initial Assessment Questions 1. LOCATION: "Where does it hurt?"      Low abdomen and back 2. RADIATION: "Does the pain shoot anywhere else?" (e.g., chest, back)     Back 3. ONSET: "When did the pain begin?" (e.g., minutes, hours or days ago)      3 days ago 4. SUDDEN: "Gradual or sudden onset?"     Gradual 5. PATTERN "Does the pain come and go, or is it constant?"    - If it comes and goes: "How long does it last?" "Do you have pain now?"     (Note: Comes and goes means the pain is intermittent. It goes away completely between bouts.)    - If constant: "Is it getting better, staying the same, or getting worse?"      (Note: Constant means the pain never goes away completely; most serious pain is constant and gets worse.)      Constant 6. SEVERITY: "How bad is the pain?"  (e.g., Scale 1-10; mild, moderate, or severe)    - MILD (1-3): Doesn't interfere with normal activities, abdomen soft and not tender to touch.     - MODERATE (4-7): Interferes with normal activities or awakens from sleep, abdomen tender to touch.     - SEVERE (8-10): Excruciating pain, doubled over, unable to do any normal activities.       10 7. RECURRENT SYMPTOM: "Have you ever had this type of stomach pain before?" If Yes, ask: "When was the last time?" and "What happened that time?"      Yes 8. CAUSE: "What do you think is causing the stomach pain?"     Unsure 9.  RELIEVING/AGGRAVATING FACTORS: "What makes it better or worse?" (e.g., antacids, bending or twisting motion, bowel movement)     No 10. OTHER SYMPTOMS: "Do you have any other symptoms?" (e.g., back pain, diarrhea, fever, urination pain, vomiting)       Nausea 11. PREGNANCY: "Is there any chance you are pregnant?" "When was your last menstrual period?"       No  Protocols used: Abdominal Pain - [facility]

## 2022-03-14 NOTE — ED Triage Notes (Addendum)
Pt arrived via POV. C/o lower back, and lower abd pain, and dizziness for 3x days. No abnormal bleeding, Bms normal. Home pregnancy test were negative.  AOx4

## 2022-03-14 NOTE — Discharge Instructions (Addendum)
Your labs and imaging in the ED were negative for any emergent causes of back or abdominal pain or any clear sources of passing out as well.  I will be discharging you with a short course of antibiotics for possible UTI as well as medicines you can use for muscle spasms.  You can use Tylenol extra strength every 6 hours and also rotate with ibuprofen up to 600 mg every 6 hours as needed.  Please also combine this with stretching, exercise, heat and ice for your lower back.  Please follow-up with your primary care doctor so they can reevaluate you in about 1 to 2 weeks.  If you continue passing out, they can do further tests in the outpatient setting.

## 2022-03-15 ENCOUNTER — Encounter (HOSPITAL_COMMUNITY): Payer: Self-pay | Admitting: *Deleted

## 2022-03-15 ENCOUNTER — Other Ambulatory Visit: Payer: Self-pay

## 2022-03-15 ENCOUNTER — Emergency Department (HOSPITAL_COMMUNITY)
Admission: EM | Admit: 2022-03-15 | Discharge: 2022-03-16 | Disposition: A | Discharge status: Home / Self Care | Payer: Medicaid Other | Attending: Emergency Medicine | Admitting: Emergency Medicine

## 2022-03-15 DIAGNOSIS — E1165 Type 2 diabetes mellitus with hyperglycemia: Secondary | ICD-10-CM | POA: Diagnosis not present

## 2022-03-15 DIAGNOSIS — R55 Syncope and collapse: Secondary | ICD-10-CM | POA: Diagnosis not present

## 2022-03-15 DIAGNOSIS — Z794 Long term (current) use of insulin: Secondary | ICD-10-CM | POA: Insufficient documentation

## 2022-03-15 DIAGNOSIS — Z7984 Long term (current) use of oral hypoglycemic drugs: Secondary | ICD-10-CM | POA: Diagnosis not present

## 2022-03-15 DIAGNOSIS — M545 Low back pain, unspecified: Secondary | ICD-10-CM | POA: Diagnosis not present

## 2022-03-15 LAB — COMPREHENSIVE METABOLIC PANEL
ALT: 22 U/L (ref 0–44)
AST: 20 U/L (ref 15–41)
Albumin: 3.3 g/dL — ABNORMAL LOW (ref 3.5–5.0)
Alkaline Phosphatase: 67 U/L (ref 38–126)
Anion gap: 9 (ref 5–15)
BUN: 12 mg/dL (ref 6–20)
CO2: 23 mmol/L (ref 22–32)
Calcium: 8.8 mg/dL — ABNORMAL LOW (ref 8.9–10.3)
Chloride: 105 mmol/L (ref 98–111)
Creatinine, Ser: 0.87 mg/dL (ref 0.44–1.00)
GFR, Estimated: >60 mL/min (ref >60)
Glucose, Bld: 304 mg/dL — ABNORMAL HIGH (ref 70–99)
Potassium: 3.8 mmol/L (ref 3.5–5.1)
Sodium: 137 mmol/L (ref 135–145)
Total Bilirubin: 0.5 mg/dL (ref 0.3–1.2)
Total Protein: 6.2 g/dL — ABNORMAL LOW (ref 6.5–8.1)

## 2022-03-15 LAB — CBC WITH DIFFERENTIAL/PLATELET
Abs Immature Granulocytes: 0.02 10*3/uL (ref 0.00–0.07)
Basophils Absolute: 0 10*3/uL (ref 0.0–0.1)
Basophils Relative: 0 %
Eosinophils Absolute: 0.1 10*3/uL (ref 0.0–0.5)
Eosinophils Relative: 2 %
HCT: 36.9 % (ref 36.0–46.0)
Hemoglobin: 12.5 g/dL (ref 12.0–15.0)
Immature Granulocytes: 0 %
Lymphocytes Relative: 46 %
Lymphs Abs: 3.1 10*3/uL (ref 0.7–4.0)
MCH: 28.8 pg (ref 26.0–34.0)
MCHC: 33.9 g/dL (ref 30.0–36.0)
MCV: 85 fL (ref 80.0–100.0)
Monocytes Absolute: 0.5 10*3/uL (ref 0.1–1.0)
Monocytes Relative: 7 %
Neutro Abs: 3.1 10*3/uL (ref 1.7–7.7)
Neutrophils Relative %: 45 %
Platelets: 232 10*3/uL (ref 150–400)
RBC: 4.34 MIL/uL (ref 3.87–5.11)
RDW: 12.1 % (ref 11.5–15.5)
WBC: 6.8 10*3/uL (ref 4.0–10.5)
nRBC: 0 % (ref 0.0–0.2)

## 2022-03-15 LAB — I-STAT BETA HCG BLOOD, ED (MC, WL, AP ONLY): I-stat hCG, quantitative: <5 m[IU]/mL (ref <5)

## 2022-03-15 LAB — CBG MONITORING, ED: Glucose-Capillary: 269 mg/dL — ABNORMAL HIGH (ref 70–99)

## 2022-03-15 LAB — LIPASE, BLOOD: Lipase: 43 U/L (ref 11–51)

## 2022-03-15 NOTE — ED Provider Notes (Signed)
Patient has passed out 3 times in triage.  Charge RN notified that patient needs a room.   Working on a room.   Montine Circle, PA-C 03/15/22 2305    Carmin Muskrat, MD 03/15/22 [phone]

## 2022-03-15 NOTE — ED Provider Triage Note (Signed)
  Emergency Medicine Provider Triage Evaluation Note  MRN:  [id number]  Arrival date & time: 03/15/22    Medically screening exam initiated at 11:39 PM.   CC:   Syncope and Back pain  HPI:  Melissa Gilmore is a 27 y.o. year-old female presents to the ED with chief complaint of back pain and syncope.  States that she has passed out twice since in triage.  Also complains of persistent low back pain for the past 4 weeks.  Seen yesterday for the same.  History provided by patient and family. ROS:  -As included in HPI PE:   Vitals:   03/15/22 2331  BP: (!) 128/90  Pulse: 74  Resp: 16  Temp: 98.3 F (36.8 C)  SpO2: 100%    Non-toxic appearing No respiratory distress Uncomfortable appearing MDM:  Patient here with syncope and back pain.   I've ordered labs and EKG in triage to expedite lab/diagnostic workup.  11:39 PM Passed twice in the waiting room.  Patient was informed that the remainder of the evaluation will be completed by another provider, this initial triage assessment does not replace that evaluation, and the importance of remaining in the ED until their evaluation is complete.    Montine Circle, PA-C 03/15/22 2339

## 2022-03-15 NOTE — ED Notes (Signed)
Pt passed out while phlebotomist was drawing blood, and came to quickly.  While attempting to put EKG leads on pt passed out again.  PA and RN was notified.

## 2022-03-15 NOTE — ED Triage Notes (Signed)
The pt has had fainting spells since yesterday  she is currently hyperventilating  hx of anxiety but I has been awhile  lmp  nov  on depo

## 2022-03-16 ENCOUNTER — Ambulatory Visit: Payer: Medicaid Other | Admitting: Internal Medicine

## 2022-03-16 ENCOUNTER — Ambulatory Visit (INDEPENDENT_AMBULATORY_CARE_PROVIDER_SITE_OTHER): Payer: Medicaid Other | Admitting: Student

## 2022-03-16 ENCOUNTER — Encounter: Payer: Self-pay | Admitting: Student

## 2022-03-16 ENCOUNTER — Emergency Department (HOSPITAL_COMMUNITY): Payer: Medicaid Other

## 2022-03-16 VITALS — BP 149/77 | HR 86 | Temp 98.9°F

## 2022-03-16 DIAGNOSIS — Z794 Long term (current) use of insulin: Secondary | ICD-10-CM

## 2022-03-16 DIAGNOSIS — R42 Dizziness and giddiness: Secondary | ICD-10-CM

## 2022-03-16 DIAGNOSIS — M5416 Radiculopathy, lumbar region: Secondary | ICD-10-CM | POA: Diagnosis not present

## 2022-03-16 DIAGNOSIS — M545 Low back pain, unspecified: Secondary | ICD-10-CM

## 2022-03-16 DIAGNOSIS — E1165 Type 2 diabetes mellitus with hyperglycemia: Secondary | ICD-10-CM

## 2022-03-16 DIAGNOSIS — R55 Syncope and collapse: Secondary | ICD-10-CM

## 2022-03-16 DIAGNOSIS — E119 Type 2 diabetes mellitus without complications: Secondary | ICD-10-CM

## 2022-03-16 LAB — URINALYSIS, ROUTINE W REFLEX MICROSCOPIC
Bilirubin Urine: NEGATIVE
Glucose, UA: >=500 mg/dL — AB
Hgb urine dipstick: NEGATIVE
Ketones, ur: NEGATIVE mg/dL
Nitrite: NEGATIVE
Protein, ur: NEGATIVE mg/dL
Specific Gravity, Urine: 1.031 — ABNORMAL HIGH (ref 1.005–1.030)
pH: 6 (ref 5.0–8.0)

## 2022-03-16 LAB — POCT URINALYSIS DIPSTICK
Bilirubin, UA: NEGATIVE
Blood, UA: NEGATIVE
Glucose, UA: POSITIVE — AB
Ketones, UA: NEGATIVE
Leukocytes, UA: NEGATIVE
Nitrite, UA: NEGATIVE
Protein, UA: NEGATIVE
Spec Grav, UA: <=1.005 — AB (ref 1.010–1.025)
Urobilinogen, UA: 0.2 E.U./dL
pH, UA: 5.5 (ref 5.0–8.0)

## 2022-03-16 LAB — POCT GLYCOSYLATED HEMOGLOBIN (HGB A1C): Hemoglobin A1C: 11.4 % — AB (ref 4.0–5.6)

## 2022-03-16 LAB — GLUCOSE, CAPILLARY: Glucose-Capillary: 558 mg/dL (ref 70–99)

## 2022-03-16 MED ORDER — NAPROXEN 500 MG PO TABS: 500.0000 mg | ORAL_TABLET | Freq: Two times a day (BID) | ORAL | 0 refills | Status: DC

## 2022-03-16 MED ORDER — MECLIZINE HCL 25 MG PO TABS: 25.0000 mg | ORAL_TABLET | Freq: Three times a day (TID) | ORAL | 0 refills | Status: DC | PRN

## 2022-03-16 MED ORDER — MECLIZINE HCL 50 MG PO TABS: 25.0000 mg | ORAL_TABLET | Freq: Three times a day (TID) | ORAL | 0 refills | Status: DC | PRN

## 2022-03-16 MED ORDER — METHYLPREDNISOLONE SODIUM SUCC 125 MG IJ SOLR
125.0000 mg | Freq: Once | INTRAMUSCULAR | Status: AC
  Administered 2022-03-16: 125 mg via INTRAVENOUS
  Filled 2022-03-16: qty 2

## 2022-03-16 MED ORDER — KETOROLAC TROMETHAMINE 30 MG/ML IJ SOLN
30.0000 mg | Freq: Once | INTRAMUSCULAR | Status: AC
  Administered 2022-03-16: 30 mg via INTRAVENOUS
  Filled 2022-03-16: qty 1

## 2022-03-16 MED ORDER — MORPHINE SULFATE (PF) 4 MG/ML IV SOLN
4.0000 mg | Freq: Once | INTRAVENOUS | Status: AC
  Administered 2022-03-16: 4 mg via INTRAVENOUS
  Filled 2022-03-16: qty 1

## 2022-03-16 MED ORDER — OXYCODONE HCL 5 MG PO TABS: 5.0000 mg | ORAL_TABLET | ORAL | 0 refills | Status: DC | PRN

## 2022-03-16 MED ORDER — PREDNISONE 50 MG PO TABS: 50.0000 mg | ORAL_TABLET | Freq: Every day | ORAL | 0 refills | Status: DC

## 2022-03-16 MED ORDER — METHOCARBAMOL 1000 MG/10ML IJ SOLN
1000.0000 mg | Freq: Once | INTRAVENOUS | Status: AC
  Administered 2022-03-16: 1000 mg via INTRAVENOUS
  Filled 2022-03-16: qty 10

## 2022-03-16 MED ORDER — ACETAMINOPHEN 325 MG PO TABS
650.0000 mg | ORAL_TABLET | Freq: Once | ORAL | Status: AC
  Administered 2022-03-16: 650 mg via ORAL
  Filled 2022-03-16: qty 2

## 2022-03-16 NOTE — Patient Instructions (Addendum)
  Thank you, Melissa Gilmore, for allowing [place] to provide your care today. Today we discussed . . .  > Back Pain       -For your back pain we think that taking the Flexeril and oxycodone you are prescribed by the ED along with the naproxen.  You have an appointment with [facility] on 03/22/2022 at 11:30 AM.  They request that you show up at 11 AM and bring your insurance card.  Please also call them tomorrow to make sure you have everything else you need before going to appointment.  Their number is [phone]. We have also set you up for an appointment with your primary care provider on Monday 2/12.  Naproxen 500 mg Oral 2 times daily oxyCODONE HCl 5 mg Oral Every 4 hours as needed Cyclobenzaprine HCl 10 mg Oral 2 times daily as needed  > Dizziness and Syncope       -We think that your symptoms are mainly related to the severe back pain and not eating and drinking as much as usual.  I think that getting the pain under better control and seeing neurosurgery will be the best next steps.  Please also ensure that you are eating and drinking adequately.  Also be careful with where you are when you feel dizzy and make sure that you are safe as possible if you were to pass out again. > High blood sugar       -We think that your acute pain and the steroids that you have been on because your sugar to go up.  We recommend that you do not pick up the prednisone from the pharmacy or if you have picked it up then you stop taking it.  Keep taking your Victoza and Lantus that you have been and monitor your sugars.   I have ordered the following labs for you:   Lab Orders         Glucose, capillary         POC Hbg A1C         POCT Urinalysis Dipstick (81002)       Tests ordered today:  None   Referrals ordered today:   Referral Orders  No referral(s) requested today      I have ordered the following medication/changed the following medications:   Stop the following  medications: There are no discontinued medications.   Start the following medications: No orders of the defined types were placed in this encounter.     Follow up:  as needed     Remember:     Should you have any questions or concerns please call the internal medicine clinic at [phone].     Melissa Gilmore, [place]

## 2022-03-16 NOTE — ED Provider Notes (Signed)
[facility] Provider Note   CSN: [id number] Arrival date & time: 03/15/22  2213     History  Chief Complaint  Patient presents with   Near Syncope    Melissa Gilmore is a 27 y.o. female.  The history is provided by the patient.  Near Syncope  She has history of diabetes, schizophrenia and comes in with ongoing problems with low back pain and passing out.  She started having back pain about 1-2 weeks ago, and it got significantly worse about 4 days ago.  When it got worse, she started having episodes of feeling dizzy and passing out.  She was evaluated in the emergency yesterday.  She continues to have severe back pain which is not relieved by acetaminophen and ibuprofen.  Pain does not radiate from her back but she does endorse numbness of both lower legs.  She denies bowel or bladder dysfunction and she denies incontinence.  With passing out episodes, she denies spit lip or tongue.  She has had back pain before, but it has always been self-limited.  She does not recall any unusual activity or trauma to precipitate this.   Home Medications Prior to Admission medications   Medication Sig Start Date End Date Taking? Authorizing Provider  Accu-Chek Softclix Lancets lancets Use to check blood sugar twice daily. Dx E11.65 08/06/20   Charlott Rakes, MD  Blood Glucose Monitoring Suppl (ACCU-CHEK GUIDE ME) w/Device KIT Use to check blood sugar twice daily. Dx E11.65 08/06/20   Charlott Rakes, MD  cyclobenzaprine (FLEXERIL) 10 MG tablet Take 1 tablet (10 mg total) by mouth 2 (two) times daily as needed for muscle spasms. 03/14/22   Linus Galas, MD  glucose blood (ACCU-CHEK GUIDE) test strip Use to check blood sugar twice daily. Dx E11.65 08/06/20   Charlott Rakes, MD  insulin glargine (LANTUS SOLOSTAR) 100 UNIT/ML Solostar Pen Inject 20 Units into the skin daily. 11/08/20 07/16/22  Gildardo Pounds, NP  Insulin Pen Needle (B-D UF III MINI  PEN NEEDLES) 31G X 5 MM MISC Use as instructed. Inject into the skin once daily 11/08/20   Gildardo Pounds, NP  lidocaine (LIDODERM) 5 % Place 1 patch onto the skin every 12 (twelve) hours. Remove & Discard patch within 12 hours or as directed by MD 03/14/22 03/14/23  Linus Galas, MD  liraglutide (VICTOZA) 18 MG/3ML SOPN Inject 1.8 mg into the skin daily. 11/08/20   Gildardo Pounds, NP  metoCLOPramide (REGLAN) 10 MG tablet Take 1 tablet (10 mg total) by mouth every 6 (six) hours as needed for nausea or vomiting. 07/21/21   Molpus, Jenny Reichmann, MD  Norgestimate-Ethinyl Estradiol Triphasic (TRI-SPRINTEC) 0.18/0.215/0.25 MG-35 MCG tablet Take 1 tablet by mouth daily. 05/10/21   Gildardo Pounds, NP  ondansetron (ZOFRAN) 4 MG tablet Take 1 tablet (4 mg total) by mouth every 8 (eight) hours as needed for nausea or vomiting. 03/14/22 03/14/23  Linus Galas, MD  sulfamethoxazole-trimethoprim (BACTRIM DS) 800-160 MG tablet Take 1 tablet by mouth 2 (two) times daily for 3 days. 03/14/22 03/17/22  Linus Galas, MD      Allergies    Patient has no known allergies.    Review of Systems   Review of Systems  Cardiovascular:  Positive for near-syncope.  All other systems reviewed and are negative.   Physical Exam Updated Vital Signs BP 130/87   Pulse 63   Temp 98.2 F (36.8 C) (Oral)   Resp 17   Ht  5\' 4"  (1.626 m)   Wt 99.8 kg   SpO2 100%   BMI 37.77 kg/m  Physical Exam Vitals and nursing note reviewed.   27 year old female, resting comfortably and in no acute distress. Vital signs are normal. Oxygen saturation is 100%, which is normal. Head is normocephalic and atraumatic. PERRLA, EOMI. Oropharynx is clear. Neck is nontender and supple without adenopathy or JVD. Back is mildly tender in the mid and lower lumbar area with minimal bilateral paralumbar spasm.  Straight leg raise is negative. Lungs are clear without rales, wheezes, or rhonchi. Chest is nontender. Heart has regular rate  and rhythm without murmur. Abdomen is soft, flat, nontender without masses or hepatosplenomegaly and peristalsis is normoactive. Extremities have no cyanosis or edema, full range of motion is present. Skin is warm and dry without rash. Neurologic: Mental status is normal, cranial nerves are intact, strength is 5/5 in all 4 extremities.  No objective sensory deficits.  ED Results / Procedures / Treatments   Labs (all labs ordered are listed, but only abnormal results are displayed) Labs Reviewed  COMPREHENSIVE METABOLIC PANEL - Abnormal; Notable for the following components:      Result Value   Glucose, Bld 304 (*)    Calcium 8.8 (*)    Total Protein 6.2 (*)    Albumin 3.3 (*)    All other components within normal limits  URINALYSIS, ROUTINE W REFLEX MICROSCOPIC - Abnormal; Notable for the following components:   APPearance HAZY (*)    Specific Gravity, Urine 1.031 (*)    Glucose, UA >=500 (*)    Leukocytes,Ua MODERATE (*)    Bacteria, UA RARE (*)    All other components within normal limits  CBG MONITORING, ED - Abnormal; Notable for the following components:   Glucose-Capillary 269 (*)    All other components within normal limits  LIPASE, BLOOD  CBC WITH DIFFERENTIAL/PLATELET  I-STAT BETA HCG BLOOD, ED ([facility], [facility], [facility] ONLY)    EKG EKG Interpretation  Date/Time:  Thursday March 16 2022 02:39:34 EST Ventricular Rate:  77 PR Interval:  184 QRS Duration: 91 QT Interval:  381 QTC Calculation: 432 R Axis:   85 Text Interpretation: Sinus rhythm Normal ECG When compared with ECG of 03/14/2022, No significant change was found Confirmed by 05/13/2022 (Dione Booze) on 03/16/2022 2:42:02 AM  Radiology MR LUMBAR SPINE WO CONTRAST  Result Date: 03/16/2022 CLINICAL DATA:  27 year old female with back pain and syncope. Four weeks of back pain. EXAM: MRI LUMBAR SPINE WITHOUT CONTRAST TECHNIQUE: Multiplanar, multisequence MR imaging of the lumbar spine was performed. No intravenous contrast was  administered. COMPARISON:  CT Abdomen and Pelvis 03/14/2022 and earlier. FINDINGS: Segmentation:  Normal on the comparison. Alignment: Stable lumbar lordosis from the recent CT, remarkable for grade 1 anterolisthesis of L5 on S1 measuring 3-4 mm. 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 And widespread mild chronic lumbar vertebral endplate irregularity. No lumbar pars fractures on the recent CT. No marrow edema or evidence of acute osseous abnormality. Visualized bone marrow signal is within normal limits. Intact visible sacrum and SI joints. Conus medullaris and cauda equina: Conus extends to the T12-L1 level. No lower spinal cord or conus signal abnormality. Paraspinal and other soft tissues: Negative. Disc levels: Negative visible lower thoracic disc spaces through L1-L2. L2-L3: Posterior disc desiccation and disc space loss with broad-based posterior disc bulging. Small superimposed paracentral disc protrusion (series 8 image 16). Borderline spinal stenosis. No lateral recess or  foraminal stenosis. L3-L4: Disc desiccation mild disc space loss. Moderate sized central and caudal disc extrusion. See series 5, image 8 and series 8, image 23). Extruded disc measures about 9 mm long axis. Superimposed mild epidural lipomatosis and mild facet hypertrophy. Moderate spinal stenosis and lateral recess stenosis on the right is moderate to severe (right L4 nerve level). No foraminal involvement or stenosis. L4-L5:  Negative disc.  Mild facet hypertrophy.  No stenosis. L5-S1: Grade 1 anterolisthesis with only minor disc/pseudo disc bulging. Mild facet hypertrophy. No significant stenosis. IMPRESSION: 1. Lumbar disc degeneration at L2-L3 and L3-L4. Moderate-sized caudal disc extrusion at the latter with moderate spinal and moderate to severe right lateral recess stenosis. Query Right L4 radiculitis. 2. Grade 1 anterolisthesis at L5-S1 with no associated pars defects or  stenosis. Electronically Signed   By: Genevie Ann M.D.   On: 03/16/2022 06:24   CT HEAD WO CONTRAST (5MM)  Result Date: 03/14/2022 CLINICAL DATA:  Mental status change of unknown cause. Dizziness over the last 3 days. EXAM: CT HEAD WITHOUT CONTRAST TECHNIQUE: Contiguous axial images were obtained from the base of the skull through the vertex without intravenous contrast. RADIATION DOSE REDUCTION: This exam was performed according to the departmental dose-optimization program which includes automated exposure control, adjustment of the mA and/or kV according to patient size and/or use of iterative reconstruction technique. COMPARISON:  None Available. FINDINGS: Brain: The brain shows a normal appearance without evidence of malformation, atrophy, old or acute small or large vessel infarction, mass lesion, hemorrhage, hydrocephalus or extra-axial collection. Vascular: No hyperdense vessel. No evidence of atherosclerotic calcification. Skull: Normal.  No traumatic finding.  No focal bone lesion. Sinuses/Orbits: Sinuses are clear. Orbits appear normal. Mastoids are clear. Other: None significant IMPRESSION: Normal head CT. Electronically Signed   By: Nelson Chimes M.D.   On: 03/14/2022 18:42   CT ABDOMEN PELVIS W CONTRAST  Result Date: 03/14/2022 CLINICAL DATA:  Lower back and abdominal pain, dizziness x3 days. EXAM: CT ABDOMEN AND PELVIS WITH CONTRAST TECHNIQUE: Multidetector CT imaging of the abdomen and pelvis was performed using the standard protocol following bolus administration of intravenous contrast. RADIATION DOSE REDUCTION: This exam was performed according to the departmental dose-optimization program which includes automated exposure control, adjustment of the mA and/or kV according to patient size and/or use of iterative reconstruction technique. CONTRAST:  162mL OMNIPAQUE IOHEXOL 300 MG/ML  SOLN COMPARISON:  Multiple priors including most recent CT July 21, 2021. FINDINGS: Lower chest: Hypoventilatory  change in the dependent lungs. Hepatobiliary: No suspicious hepatic lesion. Gallbladder surgically absent. No biliary ductal dilation. Pancreas: No pancreatic ductal dilation or evidence of acute inflammation. Spleen: No splenomegaly. Adrenals/Urinary Tract: Bilateral adrenal glands appear normal. No hydronephrosis. Kidneys demonstrate symmetric enhancement. Urinary bladder is unremarkable for degree of distension. Stomach/Bowel: Stomach is unremarkable for degree of distension. No pathologic dilation of small or large bowel. Normal appendix and terminal ileum. No evidence of acute bowel inflammation. Vascular/Lymphatic: Normal caliber abdominal aorta. Smooth IVC contours. No pathologically enlarged abdominal or pelvic lymph nodes. Reproductive: Uterus and bilateral adnexa are unremarkable. Other: No significant abdominopelvic free fluid Musculoskeletal: No acute osseous abnormality IMPRESSION: 1. No acute abnormality identified in the abdomen or pelvis. Electronically Signed   By: Dahlia Bailiff M.D.   On: 03/14/2022 18:39    Procedures Procedures  Cardiac monitor shows normal sinus rhythm, per my interpretation.  Medications Ordered in ED Medications  methocarbamol (ROBAXIN) 1,000 mg in dextrose 5 % 100 mL IVPB (has no administration in  time range)  ketorolac (TORADOL) 30 MG/ML injection 30 mg (30 mg Intravenous Given 03/16/22 0244)  acetaminophen (TYLENOL) tablet 650 mg (650 mg Oral Given 03/16/22 0243)    ED Course/ Medical Decision Making/ A&P                             Medical Decision Making Amount and/or Complexity of Data Reviewed Radiology: ordered.  Risk OTC drugs. Prescription drug management.   Ongoing low back pain which has not responded to routine measures, but no findings suggestive of severe lumbar pathology.  No evidence of radiculopathy on exam.  Episodes of syncope which sound like they may be vasovagal, also may be functional.  I have reviewed her past records, and at the  ED visit yesterday the provider who saw her mention several episodes of syncope while she was in front of him and actually she remained responsive to voice.  Yesterday she had CT of head and also CT of abdomen and pelvis both of which were negative for acute process.  I have reviewed and interpreted her ECG today and my interpretation is normal ECG.  I have reviewed and interpreted her laboratory tests, my interpretation is hyperglycemia consistent with known history of diabetes, borderline low albumin level likely nutritional, normal CBC, urinalysis significant for glucose.  I do not feel she needs any additional workup for syncope.  In light of failure of her lumbar pain did respond to simple measures, I have ordered an MRI of her lumbar spine.  I am also giving her a therapeutic trial of ketorolac, acetaminophen, intravenous methocarbamol.  As she noted no improvement with above-noted treatment.  MRI shows lumbar disc degeneration at L2-3 and L3-4 with moderate sized caudal disc extrusion with moderate spinal and moderate to severe right lateral recess stenosis.  I have independently viewed the images, and agree with the radiologist's interpretation.  I believe this is a nonsurgical lesion.  I have ordered a dose of morphine for pain and also a dose of methylprednisolone.  I am discharging her with prescriptions for prednisone, naproxen, and a small number of oxycodone tablets.  I am referring her to neurosurgery for evaluation.  Final Clinical Impression(s) / ED Diagnoses Final diagnoses:  Midline low back pain without sciatica, unspecified chronicity  Syncope, unspecified syncope type    Rx / DC Orders ED Discharge Orders          Ordered    predniSONE (DELTASONE) 50 MG tablet  Daily        03/16/22 0650    oxyCODONE (ROXICODONE) 5 MG immediate release tablet  Every 4 hours PRN        03/16/22 0650    naproxen (NAPROSYN) 500 MG tablet  2 times daily        03/16/22 8786               Delora Fuel, MD 76/72/09 [phone]

## 2022-03-16 NOTE — Progress Notes (Unsigned)
27 y.o. female with PMH diabetes, depression, schizophrenia, DID  Back pain Vasovagal syncope   MRI L spine IMPRESSION: 1. Lumbar disc degeneration at L2-L3 and L3-L4. Moderate-sized caudal disc extrusion at the latter with moderate spinal and moderate to severe right lateral recess stenosis. Query Right L4 radiculitis.   2. Grade 1 anterolisthesis at L5-S1 with no associated pars defects or stenosis.  Back pain slight pain for years, 2 weeks ago without known event Radicular to abdomne,  Numb legs and weak, right worse,   Bruning with urination, outside, itching in perineum, UA negative  Syncope 2 years ago with heavy perioid  Return of syncope Tuesday with prodrome of blood leaving the head  Preceded by low back pain with radicular  Dizzy all the time, even when going to sleep No acs like symptoms  Nausea and vomiting a week ago, not vomiting anymore   Victoza 1.8 and lantus 20 daily, for the past year   Last took insulin yesterday afternoon, usually takes it at night.  Abnorla head turn to left, left vestibular?  Since exam negative, no central cause  Straight leg negative [place] sensation on the right lower extremity and slightly decreased strength in the lower extremities right greater than left  Meclizine Has not picked up oxy Flexeril- yes Naproxen- yes   Write ddown meds to use    CC: ***  HPI:  Melissa Gilmore is a 27 y.o. 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 In no acute distress. HENT: Normocephalic, atraumatic,  Eyes:  Sclera non-icteric, PERRL, EOM intact Neck:{Exam; head & neck:17739} Cardio:Regular rate and rhythm. No murmurs, rubs, or gallops. 2+ bilateral {PulseLoc:28294} pulses. Pulm:Clear to auscultation bilaterally. Normal work of breathing on room air. Abdomen: Soft, non-tender, non-distended, positive bowel sounds. WNU:UVOZDGUY for extremity edema. Skin:Warm and dry. Neuro:Alert and oriented x3. No focal deficit noted. Psych:Pleasant mood and affect.   Assessment & Plan:   No problem-specific Assessment & Plan notes found for this encounter.    Patient {GC/GE:[id number]::"discussed with","seen with"} Dr. Orpah Clinton, [facility] Resident PGY-1 Pager: [phone]

## 2022-03-16 NOTE — Discharge Instructions (Addendum)
Apply ice to your lower back.  Ice to be applied for 30 minutes at a time, 4 times a day.  Take the naproxen which has been prescribed for you twice a day.  Take the prednisone once a day until it is gone.  You may add acetaminophen to the naproxen to get some additional pain relief.  Reserve oxycodone for pain not controlled by the combination of naproxen and acetaminophen.

## 2022-03-18 ENCOUNTER — Inpatient Hospital Stay (HOSPITAL_COMMUNITY)
Admission: EM | Admit: 2022-03-18 | Discharge: 2022-03-21 | DRG: 552 | Disposition: A | Discharge status: Home / Self Care | Payer: Medicaid Other | Attending: Internal Medicine | Admitting: Internal Medicine

## 2022-03-18 ENCOUNTER — Encounter (HOSPITAL_COMMUNITY): Payer: Self-pay | Admitting: Emergency Medicine

## 2022-03-18 ENCOUNTER — Other Ambulatory Visit: Payer: Self-pay

## 2022-03-18 DIAGNOSIS — M5136 Other intervertebral disc degeneration, lumbar region: Secondary | ICD-10-CM | POA: Diagnosis not present

## 2022-03-18 DIAGNOSIS — M545 Low back pain, unspecified: Secondary | ICD-10-CM

## 2022-03-18 DIAGNOSIS — Z833 Family history of diabetes mellitus: Secondary | ICD-10-CM

## 2022-03-18 DIAGNOSIS — M5441 Lumbago with sciatica, right side: Principal | ICD-10-CM

## 2022-03-18 DIAGNOSIS — Z800 Family history of malignant neoplasm of digestive organs: Secondary | ICD-10-CM

## 2022-03-18 DIAGNOSIS — E785 Hyperlipidemia, unspecified: Secondary | ICD-10-CM | POA: Diagnosis present

## 2022-03-18 DIAGNOSIS — E876 Hypokalemia: Secondary | ICD-10-CM | POA: Diagnosis present

## 2022-03-18 DIAGNOSIS — F209 Schizophrenia, unspecified: Secondary | ICD-10-CM | POA: Diagnosis present

## 2022-03-18 DIAGNOSIS — M5116 Intervertebral disc disorders with radiculopathy, lumbar region: Principal | ICD-10-CM | POA: Diagnosis present

## 2022-03-18 DIAGNOSIS — G8929 Other chronic pain: Secondary | ICD-10-CM | POA: Diagnosis present

## 2022-03-18 DIAGNOSIS — M4800 Spinal stenosis, site unspecified: Secondary | ICD-10-CM | POA: Diagnosis not present

## 2022-03-18 DIAGNOSIS — M48061 Spinal stenosis, lumbar region without neurogenic claudication: Secondary | ICD-10-CM | POA: Diagnosis present

## 2022-03-18 DIAGNOSIS — T380X5A Adverse effect of glucocorticoids and synthetic analogues, initial encounter: Secondary | ICD-10-CM | POA: Diagnosis present

## 2022-03-18 DIAGNOSIS — R29818 Other symptoms and signs involving the nervous system: Secondary | ICD-10-CM

## 2022-03-18 DIAGNOSIS — Z794 Long term (current) use of insulin: Secondary | ICD-10-CM

## 2022-03-18 DIAGNOSIS — Z7901 Long term (current) use of anticoagulants: Secondary | ICD-10-CM

## 2022-03-18 DIAGNOSIS — E1165 Type 2 diabetes mellitus with hyperglycemia: Secondary | ICD-10-CM | POA: Diagnosis present

## 2022-03-18 DIAGNOSIS — Z79899 Other long term (current) drug therapy: Secondary | ICD-10-CM

## 2022-03-18 HISTORY — DX: Low back pain, unspecified: M54.50

## 2022-03-18 LAB — COMPREHENSIVE METABOLIC PANEL
ALT: 34 U/L (ref 0–44)
AST: 21 U/L (ref 15–41)
Albumin: 3.6 g/dL (ref 3.5–5.0)
Alkaline Phosphatase: 91 U/L (ref 38–126)
Anion gap: 11 (ref 5–15)
BUN: 8 mg/dL (ref 6–20)
CO2: 23 mmol/L (ref 22–32)
Calcium: 8.8 mg/dL — ABNORMAL LOW (ref 8.9–10.3)
Chloride: 99 mmol/L (ref 98–111)
Creatinine, Ser: 0.82 mg/dL (ref 0.44–1.00)
GFR, Estimated: >60 mL/min (ref >60)
Glucose, Bld: 267 mg/dL — ABNORMAL HIGH (ref 70–99)
Potassium: 3.3 mmol/L — ABNORMAL LOW (ref 3.5–5.1)
Sodium: 133 mmol/L — ABNORMAL LOW (ref 135–145)
Total Bilirubin: 0.4 mg/dL (ref 0.3–1.2)
Total Protein: 6.5 g/dL (ref 6.5–8.1)

## 2022-03-18 LAB — GLUCOSE, CAPILLARY
Glucose-Capillary: 378 mg/dL — ABNORMAL HIGH (ref 70–99)
Glucose-Capillary: 288 mg/dL — ABNORMAL HIGH (ref 70–99)
Glucose-Capillary: 277 mg/dL — ABNORMAL HIGH (ref 70–99)
Glucose-Capillary: 458 mg/dL — ABNORMAL HIGH (ref 70–99)
Glucose-Capillary: 454 mg/dL — ABNORMAL HIGH (ref 70–99)

## 2022-03-18 LAB — CBC
HCT: 38.3 % (ref 36.0–46.0)
Hemoglobin: 13.5 g/dL (ref 12.0–15.0)
MCH: 29.3 pg (ref 26.0–34.0)
MCHC: 35.2 g/dL (ref 30.0–36.0)
MCV: 83.3 fL (ref 80.0–100.0)
Platelets: 249 10*3/uL (ref 150–400)
RBC: 4.6 MIL/uL (ref 3.87–5.11)
RDW: 12.3 % (ref 11.5–15.5)
WBC: 9.6 10*3/uL (ref 4.0–10.5)
nRBC: 0 % (ref 0.0–0.2)

## 2022-03-18 LAB — CBG MONITORING, ED
Glucose-Capillary: 260 mg/dL — ABNORMAL HIGH (ref 70–99)
Glucose-Capillary: 373 mg/dL — ABNORMAL HIGH (ref 70–99)

## 2022-03-18 MED ORDER — SENNOSIDES-DOCUSATE SODIUM 8.6-50 MG PO TABS
1.0000 | ORAL_TABLET | Freq: Every day | ORAL | Status: DC
  Administered 2022-03-18 – 2022-03-19 (×2): 1 via ORAL
  Filled 2022-03-18 (×2): qty 1

## 2022-03-18 MED ORDER — POTASSIUM CHLORIDE CRYS ER 20 MEQ PO TBCR
40.0000 meq | EXTENDED_RELEASE_TABLET | Freq: Once | ORAL | Status: AC
  Administered 2022-03-19: 40 meq via ORAL
  Filled 2022-03-18: qty 2

## 2022-03-18 MED ORDER — INSULIN ASPART 100 UNIT/ML IJ SOLN
0.0000 [IU] | Freq: Every day | INTRAMUSCULAR | Status: DC
  Administered 2022-03-18: 5 [IU] via SUBCUTANEOUS
  Administered 2022-03-19: 4 [IU] via SUBCUTANEOUS

## 2022-03-18 MED ORDER — HYDROMORPHONE HCL 1 MG/ML IJ SOLN
0.5000 mg | INTRAMUSCULAR | Status: DC | PRN
  Administered 2022-03-19: 0.5 mg via INTRAVENOUS
  Filled 2022-03-18: qty 0.5

## 2022-03-18 MED ORDER — INSULIN ASPART 100 UNIT/ML IJ SOLN: 0.0000 [IU] | Freq: Every day | INTRAMUSCULAR | Status: DC

## 2022-03-18 MED ORDER — INSULIN ASPART 100 UNIT/ML IJ SOLN
0.0000 [IU] | Freq: Three times a day (TID) | INTRAMUSCULAR | Status: DC
  Administered 2022-03-19: 3 [IU] via SUBCUTANEOUS
  Administered 2022-03-19: 20 [IU] via SUBCUTANEOUS
  Administered 2022-03-19: 15 [IU] via SUBCUTANEOUS
  Administered 2022-03-20: 11 [IU] via SUBCUTANEOUS
  Administered 2022-03-20: 7 [IU] via SUBCUTANEOUS
  Administered 2022-03-20: 4 [IU] via SUBCUTANEOUS
  Administered 2022-03-21: 11 [IU] via SUBCUTANEOUS
  Administered 2022-03-21: 3 [IU] via SUBCUTANEOUS

## 2022-03-18 MED ORDER — OXYCODONE HCL 5 MG PO TABS
5.0000 mg | ORAL_TABLET | ORAL | Status: DC | PRN
  Administered 2022-03-18 – 2022-03-21 (×9): 5 mg via ORAL
  Filled 2022-03-18 (×9): qty 1

## 2022-03-18 MED ORDER — NAPROXEN 250 MG PO TABS
500.0000 mg | ORAL_TABLET | Freq: Two times a day (BID) | ORAL | Status: DC
  Administered 2022-03-18 – 2022-03-19 (×3): 500 mg via ORAL
  Filled 2022-03-18 (×3): qty 2

## 2022-03-18 MED ORDER — KETOROLAC TROMETHAMINE 15 MG/ML IJ SOLN: 30.0000 mg | Freq: Once | INTRAMUSCULAR | Status: DC

## 2022-03-18 MED ORDER — INSULIN ASPART 100 UNIT/ML IJ SOLN
15.0000 [IU] | Freq: Once | INTRAMUSCULAR | Status: AC
  Administered 2022-03-18: 15 [IU] via SUBCUTANEOUS

## 2022-03-18 MED ORDER — ACETAMINOPHEN 500 MG PO TABS
1000.0000 mg | ORAL_TABLET | Freq: Three times a day (TID) | ORAL | Status: DC
  Administered 2022-03-18 – 2022-03-21 (×11): 1000 mg via ORAL
  Filled 2022-03-18 (×12): qty 2

## 2022-03-18 MED ORDER — INSULIN ASPART 100 UNIT/ML IJ SOLN: 0.0000 [IU] | Freq: Three times a day (TID) | INTRAMUSCULAR | Status: DC

## 2022-03-18 MED ORDER — INSULIN ASPART 100 UNIT/ML IJ SOLN
10.0000 [IU] | Freq: Once | INTRAMUSCULAR | Status: AC
  Administered 2022-03-18: 10 [IU] via SUBCUTANEOUS

## 2022-03-18 MED ORDER — INSULIN ASPART 100 UNIT/ML IJ SOLN
0.0000 [IU] | Freq: Three times a day (TID) | INTRAMUSCULAR | Status: DC
  Administered 2022-03-18 (×2): 8 [IU] via SUBCUTANEOUS
  Administered 2022-03-18: 15 [IU] via SUBCUTANEOUS

## 2022-03-18 MED ORDER — HYDROMORPHONE HCL 1 MG/ML IJ SOLN
1.0000 mg | Freq: Once | INTRAMUSCULAR | Status: AC
  Administered 2022-03-18: 1 mg via INTRAVENOUS
  Filled 2022-03-18: qty 1

## 2022-03-18 MED ORDER — INSULIN GLARGINE-YFGN 100 UNIT/ML ~~LOC~~ SOLN
20.0000 [IU] | Freq: Every day | SUBCUTANEOUS | Status: DC
  Administered 2022-03-18 – 2022-03-19 (×2): 20 [IU] via SUBCUTANEOUS
  Filled 2022-03-18 (×2): qty 0.2

## 2022-03-18 MED ORDER — DEXAMETHASONE SODIUM PHOSPHATE 10 MG/ML IJ SOLN
10.0000 mg | Freq: Once | INTRAMUSCULAR | Status: AC
  Administered 2022-03-18: 10 mg via INTRAVENOUS
  Filled 2022-03-18: qty 1

## 2022-03-18 MED ORDER — ENOXAPARIN SODIUM 40 MG/0.4ML IJ SOSY
40.0000 mg | PREFILLED_SYRINGE | Freq: Every day | INTRAMUSCULAR | Status: DC
  Administered 2022-03-18 – 2022-03-20 (×3): 40 mg via SUBCUTANEOUS
  Filled 2022-03-18 (×3): qty 0.4

## 2022-03-18 MED ORDER — HYDROMORPHONE HCL 1 MG/ML IJ SOLN: 1.0000 mg | Freq: Once | INTRAMUSCULAR | Status: DC

## 2022-03-18 MED ORDER — DEXAMETHASONE SODIUM PHOSPHATE 4 MG/ML IJ SOLN
4.0000 mg | Freq: Four times a day (QID) | INTRAMUSCULAR | Status: DC
  Administered 2022-03-18 – 2022-03-19 (×3): 4 mg via INTRAVENOUS
  Filled 2022-03-18 (×3): qty 1

## 2022-03-18 MED ORDER — KETOROLAC TROMETHAMINE 15 MG/ML IJ SOLN
15.0000 mg | Freq: Once | INTRAMUSCULAR | Status: AC
  Administered 2022-03-18: 15 mg via INTRAVENOUS
  Filled 2022-03-18: qty 1

## 2022-03-18 MED ORDER — POTASSIUM CHLORIDE CRYS ER 20 MEQ PO TBCR
40.0000 meq | EXTENDED_RELEASE_TABLET | Freq: Once | ORAL | Status: AC
  Administered 2022-03-18: 40 meq via ORAL
  Filled 2022-03-18: qty 2

## 2022-03-18 NOTE — H&P (Signed)
NAME:  Melissa Gilmore, MRN:  [id number], DOB:  15-Sep-1995, LOS: 0 ADMISSION DATE:  03/18/2022, Primary: Dorna Mai, MD  CHIEF COMPLAINT:  back pain   Medical Service: Internal Medicine Teaching Service         Attending Physician: Dr. Jimmye Norman, Elaina Pattee, MD    First Contact: Dr. Markus Jarvis Pager: [phone]  Second Contact: Dr. Raymondo Band Pager: [phone]       After Hours (After 5p/  First Contact Pager: [phone]  weekends / holidays): Second Contact Pager: [place] is 26yo person with type II diabetes mellitus, dyslipidemia, depression presenting to [facility] with worsening lower back pain. Patient reports intermittent chronic lower back pain that typically has been relieved with prolonged times of sitting down or laying down. However, roughly two weeks ago patient reports pain became more sharp and persisted while she was sitting down. She denies any heavy lifting, strenuous physical activity, or inciting event prior to this. The pain is midline lower back, non-radiating, does not change with position. Over the last two her pain has worsened and has affected her functional status. She describes needing a walker at home, although this has not helped her. She further describes weakness, mainly in her right leg that also makes ambulating difficult. There is no position where she currently feels relief of pain or makes it better, including leaning over. Melissa Gilmore lives in a one story home and requires one step to get into the house. She notes even this one step is very difficult to navigate with her current symptoms. She was recently seen in the ED two days ago and had a MRI completed at that time. After this, she developed numbness and paresthesias in her lower right leg. Given this and her pain continued to worsen, she returned to the ED this morning. She denies any fevers, chills, vision changes, numbness/paresthesias in upper extremities,  bowel/bladder incontinence, falls. She has not had any abdominal pain, but does note she has not had a bowel movement in almost a week.  Since her back pain acutely worsened, patient also notes intermittent episodes of syncope. She describes feeling of blood rushing through her face prior to losing consciousness for a few seconds. She also mentions a feeling of the room spinning that occurs simultaneously. Melissa Gilmore does note that these episodes tend to occur when her pain is unbearable. Her PO intake has been appropriate, mentions that she tries to drink a lot of fluid daily. She denies any preceding palpitations, chest pain, abdominal pain.  PCP: Dorna Mai, MD  ED COURSE   Patient initially arrived to [facility] hypertensive, but otherwise afebrile and hemodynamically stable sating well on room air. Initial lab work revealed mild hypokalemia and hyperglycemia. Neurosurgery was consulted, who recommended outpatient management vs admission for pain control. Given patient's pain was not controlled and she has been having difficulty ambulating, IMTS was consulted for admission.   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   HOME MEDICATIONS   Prior to Admission medications   Medication Sig Start Date End Date Taking? Authorizing Provider  cyclobenzaprine (FLEXERIL) 10 MG tablet Take 1 tablet (10 mg total) by mouth 2 (two) times daily as needed for muscle spasms. 03/14/22  Yes Linus Galas, MD  insulin glargine (LANTUS SOLOSTAR) 100 UNIT/ML Solostar Pen Inject 20 Units into the skin  daily. Patient taking differently: Inject 20 Units into the skin at bedtime. 11/08/20 07/16/22 Yes Gildardo Pounds, NP  lidocaine (LIDODERM) 5 % Place 1 patch onto the skin every 12 (twelve) hours. Remove & Discard patch within 12 hours or as directed by MD 03/14/22 03/14/23 Yes  Linus Galas, MD  liraglutide (VICTOZA) 18 MG/3ML SOPN Inject 1.8 mg into the skin daily. 11/08/20  Yes Gildardo Pounds, NP  meclizine (ANTIVERT) 25 MG tablet Take 1 tablet (25 mg total) by mouth 3 (three) times daily as needed for dizziness. 03/16/22  Yes [name], DO  medroxyPROGESTERone (DEPO-PROVERA) 150 MG/ML injection Inject 150 mg into the muscle every 3 (three) months.   Yes [provider]  naproxen (NAPROSYN) 500 MG tablet Take 1 tablet (500 mg total) by mouth 2 (two) times daily. 99991111  Yes Delora Fuel, MD  ondansetron (ZOFRAN) 4 MG tablet Take 1 tablet (4 mg total) by mouth every 8 (eight) hours as needed for nausea or vomiting. 03/14/22 03/14/23 Yes Linus Galas, MD  oxyCODONE (ROXICODONE) 5 MG immediate release tablet Take 1 tablet (5 mg total) by mouth every 4 (four) hours as needed for severe pain. [id number]  Yes Delora Fuel, MD  predniSONE (DELTASONE) 50 MG tablet Take 1 tablet (50 mg total) by mouth daily. [id number]  Yes Delora Fuel, MD  Accu-Chek Softclix Lancets lancets Use to check blood sugar twice daily. Dx E11.65 08/06/20   Charlott Rakes, MD  Blood Glucose Monitoring Suppl (ACCU-CHEK GUIDE ME) w/Device KIT Use to check blood sugar twice daily. Dx E11.65 08/06/20   Charlott Rakes, MD  glucose blood (ACCU-CHEK GUIDE) test strip Use to check blood sugar twice daily. Dx E11.65 08/06/20   Charlott Rakes, MD  Insulin Pen Needle (B-D UF III MINI PEN NEEDLES) 31G X 5 MM MISC Use as instructed. Inject into the skin once daily 11/08/20   Gildardo Pounds, NP  Norgestimate-Ethinyl Estradiol Triphasic (TRI-SPRINTEC) 0.18/0.215/0.25 MG-35 MCG tablet Take 1 tablet by mouth daily. 05/10/21   Gildardo Pounds, NP    ALLERGIES   Allergies as of 03/18/2022   (No Known Allergies)    SOCIAL HISTORY   Patient currently lives with her boyfriend in a duplex here in [place] ([place], one step to enter doorway). She can complete ADL's and IADL's independently  typically (prior to symptom onset). She denies tobacco use. Reports occasional alcohol use and THC use.   FAMILY HISTORY   Her family history includes Diabetes in her maternal grandmother; Stomach cancer in her maternal grandmother.   REVIEW OF SYSTEMS   ROS per history of present illness.  PHYSICAL EXAMINATION   Blood pressure 123/79, pulse 67, temperature 98 F (36.7 C), temperature source Oral, resp. rate 17, height 5' 4"$  (1.626 m), weight 100 kg, SpO2 94 %.    Filed Weights   03/18/22 0051  Weight: 100 kg   GENERAL: Well-appearing person laying in bed, appears uncomfortable HENT: Normocephalic, atraumatic. Dry mucous membranes. EYES: Vision grossly in tact. Normal extraocular movements. No scleral icterus or conjunctival injection. CV: Regular rate, rhythm. No murmurs appreciated. Dorsalis pedis pulses 2+ bilaterally.  PULM: Normal work of breathing on room air. Clear to auscultation bilaterally. GI: Abdomen soft, non-tender, non-distended. Normoactive bowel sounds.  MSK: Normal bulk, tone. No peripheral edema noted. SKIN: Warm, dry. No rashes or lesions appreciated. NEURO: Awake, alert, conversing appropriately. Cranial nerves in tact. Upper extremity motor strength 5/5, sensation in tact. Right lower extremity 3/5 proximal and distal strength. Sensation decreased below  patella. Left lower extremity 5/5 strength with sensation in tact. Finger to nose normal bilaterally.  PSYCH: Normal mood, affect, speech.   SIGNIFICANT DIAGNOSTIC TESTS   MRI LUMBAR SPINE (03/16/22): Lumbar disc degeneration at L2-L3 and L3-L4. Moderate-sized caudal disc extrusion at the latter with moderate spinal and moderate to severe right lateral recess stenosis. Query Right L4 radiculitis. Grade 1 anterolisthesis at L5-S1 with no associated pars defects or stenosis.  LABS      Latest Ref Rng & Units 03/18/2022    2:15 AM 03/15/2022   10:57 PM 03/14/2022    1:58 PM  CBC  WBC 4.0 - 10.5 K/uL 9.6  6.8  7.6    Hemoglobin 12.0 - 15.0 g/dL 13.5  12.5  13.9   Hematocrit 36.0 - 46.0 % 38.3  36.9  40.6   Platelets 150 - 400 K/uL 249  232  265       Latest Ref Rng & Units 03/18/2022    2:15 AM 03/15/2022   10:57 PM 03/14/2022    1:58 PM  BMP  Glucose 70 - 99 mg/dL 267  304  262   BUN 6 - 20 mg/dL 8  12  9   $ Creatinine 0.44 - 1.00 mg/dL 0.82  0.87  0.62   Sodium 135 - 145 mmol/L 133  137  136   Potassium 3.5 - 5.1 mmol/L 3.3  3.8  3.7   Chloride 98 - 111 mmol/L 99  105  104   CO2 22 - 32 mmol/L 23  23  23   $ Calcium 8.9 - 10.3 mg/dL 8.8  8.8  8.9     CONSULTS   [place] Ramirex Domenick Gong is 27yo person with type II diabetes mellitus, dyslipidemia, depression admitted 2/10 with acute on chronic spinal lumbar pain with new neurological deficits limiting her functional status.   PLAN   Principal Problem:   Acute lumbar back pain  #Acute on chronic lumbar back pain #L2-L3 disc degeneration #Spinal stenosis, R lateral recess stenosis Patient is presenting with worsening localized lumbar back pain along with new neurological deficits with decreased sensation below R knee and weakness. The pain has been so severe that this has limited her functional status to the point she is having difficulties walking. MRI on 2/8 revealed disc degeneration, disc extrusion, spinal stenosis, and right lateral recess stenosis. Patient is very young, could possibly be related to obesity? Calcium on lab work corrects to normal, we will check vitamin D level as well. Neurosurgery was consulted in ED, they recommend possible epidural steroid treatment. She did receive a dose of IV decadron in ED, however her elevated sugars have made steroid use difficult. We will hold further systemic steroids and focus on pain control. Also plan to have her work with physical and occupational therapy to improve functional status. - Appreciate neurosurgery's assistance, considering epidural steroid injection -  Acetaminophen 1061m every 8 hours - Naproxen 501mtwice daily x5d - Oxycodone 67m19mvery 4 hours as needed for moderate pain - IV dilaudid 0.67mg21mery 4 hours as needed for severe, breakthrough pain - Start bowel regimen with senokot-S daily - Follow-up vitamin D level - PT/OT  #Syncope Patient has had multiple syncopal episodes over the last few weeks since her pain has increased. These episodes sound vasovagal in nature, as she gets a "flushed" feeling while her pain is extreme. She does not feel lightheaded, dizzy, or get palpitations preceding these episodes. There is a prodrome and it  is not associated with exertion. At this juncture I do not feel as though further work-up is needed.. - Adequate pain control - Ensure adequate hydration  #Type II diabetes mellitus Most recent A1c 11.4% a few days ago in clinic. She is currently taking 20u long-acting insulin with daily Victoza. However, she was recently put on systemic steroids which made her glycemic control much more difficult. She did receive one dose of IV decadron in ED, we will plan to hold further systemic steroids at this time. We will re-start her home insulin regimen, will adjust as needed. - Start semglee 20u daily - Hold Victoza while inpatient - SSI  #Hypokalemia K 3.3, will replete w/ oral Klor-Con. Will check Mg in AM. - Follow-up BMP, Mg in AM  BEST PRACTICE   DIET: CM IVF: n/a DVT PPX: lovenox BOWEL: senokot CODE: FULL FAM COM: n/a  DISPO: Admit patient to Observation with expected length of stay less than 2 midnights.  Sanjuan Dame, MD Internal Medicine Resident PGY-3 Pager [phone] 03/18/22 8:41 AM

## 2022-03-18 NOTE — ED Notes (Signed)
Patient awake and alert, AOX4, able to verbalize her needs, no s/s of distress, provider at bedside. Will continue to monitor.

## 2022-03-18 NOTE — ED Provider Notes (Signed)
[facility] Emergency Department Provider Note MRN:  [id number]  Arrival date & time: 03/18/22     Chief Complaint   Back Pain   History of Present Illness   Melissa Gilmore is a 27 y.o. year-old female with a history of diabetes presenting to the ED with chief complaint of back pain.  Progressive back pain over the past 2 or 3 weeks, worsening over the past week or so.  Pain on the left lower back but with pain radiating down the back of the right leg.  Pain much worse with movement of the right leg.  Has had some paresthesia and mild numbness to the right leg for the past few days but over the past several hours she is lost considerable sensation to the leg and thinks it may be weak as well.  Had an MRI 2 days ago.  Denies bowel or bladder dysfunction, no fever.  Review of Systems  A thorough review of systems was obtained and all systems are negative except as noted in the HPI and PMH.   Patient's Health History    Past Medical History:  Diagnosis Date   Depression    Diabetes mellitus without complication (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[place])    Schizophrenia ([place])     Past Surgical History:  Procedure Laterality Date   CHOLECYSTECTOMY N/A 07/17/2021   Procedure: SINGLE SITE LAPAROSCOPIC CHOLECYSTECTOMY WITH INTRAOPERATIVE CHOLANGIOGRAM; LIVER BIOPSY;  Surgeon:  Boston, MD;  Location: [facility] ORS;  Service: General;  Laterality: N/A;   NO PAST SURGERIES      Family History  Problem Relation Age of Onset   Diabetes Maternal Grandmother    Stomach cancer Maternal Grandmother     Social History   Socioeconomic History   Marital status: Single    Spouse name: Not on file   Number of children: Not on file   Years of education: Not on file   Highest education level: Not on file  Occupational History   Not on file  Tobacco Use   Smoking status: Never   Smokeless tobacco: Never  Substance and Sexual Activity   Alcohol use: Not  Currently   Drug use: Not Currently   Sexual activity: Not Currently  Other Topics Concern   Not on file  Social History Narrative   Not on file   Social Determinants of Health   Financial Resource Strain: High Risk (03/16/2022)   Overall Financial Resource Strain (CARDIA)    Difficulty of Paying Living Expenses: Hard  Food Insecurity: Food Insecurity Present (03/16/2022)   Hunger Vital Sign    Worried About Running Out of Food in the Last Year: Often true    Ran Out of Food in the Last Year: Often true  Transportation Needs: No Transportation Needs (03/16/2022)   PRAPARE - Hydrologist (Medical): No    Lack of Transportation (Non-Medical): No  Physical Activity: Not on file  Stress: Stress Concern Present (03/16/2022)   Novelty    Feeling of Stress : To some extent  Social Connections: Moderately Isolated (03/16/2022)   Social Connection and Isolation Panel [NHANES]    Frequency of Communication with Friends and Family: More than three times a week    Frequency of Social Gatherings with Friends and Family: More than three times a week    Attends Religious Services: Never    Marine scientist or Organizations: No  Attends Archivist Meetings: Never    Marital Status: Living with partner  Intimate Partner Violence: Not At Risk (03/16/2022)   Humiliation, Afraid, Rape, and Kick questionnaire    Fear of Current or Ex-Partner: No    Emotionally Abused: No    Physically Abused: No    Sexually Abused: No     Physical Exam   Vitals:   03/18/22 0317 03/18/22 0500  BP: (!) 140/95 (!) 136/108  Pulse: 67 75  Resp:  17  Temp:    SpO2: 98% 94%    CONSTITUTIONAL: Well-appearing, NAD NEURO/PSYCH:  Alert and oriented x 3, no focal deficits EYES:  eyes equal and reactive ENT/NECK:  no LAD, no JVD CARDIO: Regular rate, well-perfused, normal S1 and S2 PULM:  CTAB no wheezing or  rhonchi GI/GU:  non-distended, non-tender MSK/SPINE:  No gross deformities, no edema SKIN:  no rash, atraumatic   *Additional and/or pertinent findings included in MDM below  Diagnostic and Interventional Summary    EKG Interpretation  Date/Time:    Ventricular Rate:    PR Interval:    QRS Duration:   QT Interval:    QTC Calculation:   R Axis:     Text Interpretation:         Labs Reviewed  COMPREHENSIVE METABOLIC PANEL - Abnormal; Notable for the following components:      Result Value   Sodium 133 (*)    Potassium 3.3 (*)    Glucose, Bld 267 (*)    Calcium 8.8 (*)    All other components within normal limits  CBG MONITORING, ED - Abnormal; Notable for the following components:   Glucose-Capillary 260 (*)    All other components within normal limits  CBC    No orders to display    Medications  HYDROmorphone (DILAUDID) injection 1 mg (1 mg Intravenous Given 03/18/22 0224)  ketorolac (TORADOL) 15 MG/ML injection 15 mg (15 mg Intravenous Given 03/18/22 0224)  dexamethasone (DECADRON) injection 10 mg (10 mg Intravenous Given 03/18/22 0224)  HYDROmorphone (DILAUDID) injection 1 mg (1 mg Intravenous Given 03/18/22 0448)     Procedures  /  Critical Care Procedures  ED Course and Medical Decision Making  Initial Impression and Ddx Concern for progressive neurological deficit, MRI revealing degenerative changes and spinal stenosis 2 days ago and so suspect worsening of condition based on the worsening neurological symptoms.  Pain is also not well-controlled at this time.  Her blood sugars were going above 500 when she started the prednisone and so her PCP advised her to stop taking it.  Case discussed with neurosurgery, who will come to evaluate the patient, advising Decadron, pain control at this time.  No need for repeat MRI at this time.  Past medical/surgical history that increases complexity of ED encounter: Diabetes  Interpretation of Diagnostics I personally  reviewed the laboratory assessment and my interpretation is as follows: Mild hyperglycemia, otherwise no significant blood count or electrolyte disturbance    Patient Reassessment and Ultimate Disposition/Management     Patient evaluated by neurosurgery, recommending treatment with steroids as an outpatient if able, inpatient the hospitalist service if unable to control pain.  Patient still having great deal of discomfort, given this and her worsening neurological symptoms over the past few days, will request hospitalist admission to ensure she is improving on steroids, control blood sugar.  Patient management required discussion with the following services or consulting groups:  Hospitalist Service and Neurosurgery  Complexity of Problems Addressed Acute illness or  injury that poses threat of life of bodily function  Additional Data Reviewed and Analyzed Further history obtained from: Further history from spouse/family member  Additional Factors Impacting ED Encounter Risk Use of parenteral controlled substances and Consideration of hospitalization  Barth Kirks. [place] Small, [place] [email or web address]  Final Clinical Impressions(s) / ED Diagnoses     ICD-10-CM   1. Acute left-sided low back pain with right-sided sciatica  M54.41     2. Progressive neurological deficit  R29.818       ED Discharge Orders     None        Discharge Instructions Discussed with and Provided to Patient:   Discharge Instructions   None      Maudie Flakes, MD 03/18/22 0630

## 2022-03-18 NOTE — Consult Note (Signed)
Reason for Consult: Lumbar radiculopathy Referring Physician: EDP  Melissa Gilmore is an 27 y.o. female.   HPI:  27 year old female presented to the ED last night with progressive back and leg pain. She was in the ED 3 days ago with the same issues. She states that numbness all over her leg has gotten worse. Denies any change in her bowel or bladder habits and no saddle anesthesia. She tried oral steroids at home but it made her blood sugar go up so she stopped. All of her pain started 2 weeks ago.  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[place])    Schizophrenia ([place])     Past Surgical History:  Procedure Laterality Date   CHOLECYSTECTOMY N/A 07/17/2021   Procedure: SINGLE SITE LAPAROSCOPIC CHOLECYSTECTOMY WITH INTRAOPERATIVE CHOLANGIOGRAM; LIVER BIOPSY;  Surgeon: Melissa Boston, MD;  Location: [facility];  Service: General;  Laterality: N/A;   NO PAST SURGERIES      No Known Allergies  Social History   Tobacco Use   Smoking status: Never   Smokeless tobacco: Never  Substance Use Topics   Alcohol use: Not Currently    Family History  Problem Relation Age of Onset   Diabetes Maternal Grandmother    Stomach cancer Maternal Grandmother      Review of Systems  Positive ROS: as above  All other systems have been reviewed and were otherwise negative with the exception of those mentioned in the HPI and as above.  Objective: Vital signs in last 24 hours: Temp:  [98 F (36.7 C)] 98 F (36.7 C) (02/10 0045) Pulse Rate:  [68] 68 (02/10 0045) Resp:  [16] 16 (02/10 0045) BP: (141)/(108) 141/108 (02/10 0045) SpO2:  [98 %] 98 % (02/10 0045) Weight:  [100 kg] 100 kg (02/10 0051)  General Appearance: Alert, cooperative, no distress, appears stated age Head: Normocephalic, without obvious abnormality, atraumatic Eyes: PERRL, conjunctiva/corneas clear, EOM's intact, fundi benign, both  eyes      Lungs:  respirations unlabored Heart: Regular rate and rhythm Extremities: Extremities normal, atraumatic, no cyanosis or edema Pulses: 2+ and symmetric all extremities Skin: Skin color, texture, turgor normal, no rashes or lesions  NEUROLOGIC:   Mental status: A&O x4, no aphasia, good attention span, Memory and fund of knowledge Motor Exam - grossly normal, normal tone and bulk Sensory Exam - grossly normal Reflexes: symmetric, no pathologic reflexes, No Hoffman's, No clonus Coordination - grossly normal Gait - grossly normal Balance - grossly normal Cranial Nerves: I: smell Not tested  II: visual acuity  OS: na    OD: na  II: visual fields Full to confrontation  II: pupils Equal, round, reactive to light  III,VII: ptosis None  III,IV,VI: extraocular muscles  Full ROM  V: mastication   V: facial light touch sensation    V,VII: corneal reflex    VII: facial muscle function - upper    VII: facial muscle function - lower   VIII: hearing   IX: soft palate elevation    IX,X: gag reflex   XI: trapezius strength    XI: sternocleidomastoid strength   XI: neck flexion strength    XII: tongue strength      Data Review Lab Results  Component Value Date   WBC 9.6 03/18/2022   HGB 13.5 03/18/2022   HCT 38.3 03/18/2022   MCV 83.3 03/18/2022   PLT 249 03/18/2022  Lab Results  Component Value Date   NA 133 (L) 03/18/2022   K 3.3 (L) 03/18/2022   CL 99 03/18/2022   CO2 23 03/18/2022   BUN 8 03/18/2022   CREATININE 0.82 03/18/2022   GLUCOSE 267 (H) 03/18/2022   No results found for: "INR", "PROTIME"  Radiology: No results found.   Assessment/Plan: 27 year old female presented to the ED with worsening back and leg pain. MRI shows a moderates size disc herniation at L3-4 causing stenosis. At this point it has only been two weeks of pain. Recommend steroids and pain medication. If she cannot get her pain under control recommend admission to hospitalists for pain  control with meds, an epidural steroid injection and therapy. No acute surgical intervention warranted right now.   Melissa Gilmore [facility] 03/18/2022 4:38 AM

## 2022-03-18 NOTE — Progress Notes (Deleted)
Contacted OC MD, MAPP about CBG of 454 after 15 units of insulin. MD ordered 10 units of insulin will continue to monitor.

## 2022-03-18 NOTE — ED Triage Notes (Addendum)
Patient reports back pain and "fainting spells" because the pain is so bad x 2 weeks.  Patient seen multiple times recently for same.  Patient reports she's here for pain control because her pain meds at home aren't working.  170/110 98 HR 100% RA 20 RR CBG 376

## 2022-03-18 NOTE — Progress Notes (Signed)
Contacted OC MD, MAPP about CBG of 454 after 15 units of insulin. MD ordered 15 units of insulin and Potassium pill, will continue to monitor.

## 2022-03-18 NOTE — ED Provider Triage Note (Signed)
Emergency Medicine Provider Triage Evaluation Note  Dagmar Houg [facility] , a 27 y.o. female  was evaluated in triage.  Pt complains of uncontrolled back pain.  Seen here 2 days ago for same, had MRI which revealed herniated disc and likely radiculitis.  She was referred to neurosurgery and is awaiting appointment.  States pain medicine helped yesterday but not today.  She denies any changes in her pain, just remains uncontrolled.  Review of Systems  Positive: Back pain Negative: Incontinence  Physical Exam  BP (!) 141/108 (BP Location: Right Arm)   Pulse 68   Temp 98 F (36.7 C) (Oral)   Resp 16   Ht 5' 4"$  (1.626 m)   Wt 100 kg   SpO2 98%   BMI 37.84 kg/m  Gen:   Awake, no distress   Resp:  Normal effort  MSK:   Moves extremities without difficulty  Other:  Crying during exam, moving legs on command  Medical Decision Making  Medically screening exam initiated at 1:13 AM.  Appropriate orders placed.  Karenda Dore [facility] was informed that the remainder of the evaluation will be completed by another provider, this initial triage assessment does not replace that evaluation, and the importance of remaining in the ED until their evaluation is complete.  Uncontrolled back pain.  MRI 03/16/2022 with herniated disks but no compressive findings.  Awaiting appointment with neurosurgery.  No focal deficits noted.   Larene Pickett, PA-C 03/18/22 [phone]

## 2022-03-18 NOTE — ED Notes (Signed)
ED TO INPATIENT HANDOFF REPORT  ED Nurse Name and Phone #: Leafy Ro, [place]  S Name/Age/Gender Melissa Gilmore 27 y.o. female Room/Bed: 023C/023C  Code Status   Code Status: Full Code  Home/SNF/Other Home Patient oriented to: self, place, time, and situation Is this baseline? Yes   Triage Complete: Triage complete  Chief Complaint Low back pain [M54.50]  Triage Note Patient reports back pain and "fainting spells" because the pain is so bad x 2 weeks.  Patient seen multiple times recently for same.  Patient reports she's here for pain control because her pain meds at home aren't working.  170/110 98 HR 100% RA 20 RR CBG 376   Allergies No Known Allergies  Level of Care/Admitting Diagnosis ED Disposition     ED Disposition  Admit   Condition  --   Comment  Hospital Area: [place] [[id number]]  Level of Care: Med-Surg [16]  May place patient in observation at [facility] or [facility] if equivalent level of care is available:: No  Covid Evaluation: Asymptomatic - no recent exposure (last 10 days) testing not required  Diagnosis: Low back pain [id number]  Admitting Physician: Angelica Pou [place]  Attending Physician: Angelica Pou [[id number]]          B Medical/Surgery History Past Medical History:  Diagnosis Date   Depression    Diabetes mellitus without complication ([place])    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[place])    Schizophrenia ([place])    Past Surgical History:  Procedure Laterality Date   CHOLECYSTECTOMY N/A 07/17/2021   Procedure: SINGLE SITE LAPAROSCOPIC CHOLECYSTECTOMY WITH INTRAOPERATIVE CHOLANGIOGRAM; LIVER BIOPSY;  Surgeon: Michael Boston, MD;  Location: [facility];  Service: General;  Laterality: N/A;   NO PAST SURGERIES       A IV Location/Drains/Wounds Patient Lines/Drains/Airways Status     Active Line/Drains/Airways     Name Placement date Placement time Site Days   Peripheral IV 03/18/22 20 G Right  Antecubital 03/18/22  0213  Antecubital  less than 1   Incision - 1 Port Abdomen Umbilicus [id number]  [id number]  -- 244            Intake/Output Last 24 hours No intake or output data in the 24 hours ending 03/18/22 0837  Labs/Imaging Results for orders placed or performed during the hospital encounter of 03/18/22 (from the past 48 hour(s))  CBC     Status: None   Collection Time: 03/18/22  2:15 AM  Result Value Ref Range   WBC 9.6 4.0 - 10.5 K/uL   RBC 4.60 3.87 - 5.11 MIL/uL   Hemoglobin 13.5 12.0 - 15.0 g/dL   HCT 38.3 36.0 - 46.0 %   MCV 83.3 80.0 - 100.0 fL   MCH 29.3 26.0 - 34.0 pg   MCHC 35.2 30.0 - 36.0 g/dL   RDW 12.3 11.5 - 15.5 %   Platelets 249 150 - 400 K/uL   nRBC 0.0 0.0 - 0.2 %    Comment: Performed at [facility], [place] [address], [place], [place] [address]  Comprehensive metabolic panel     Status: Abnormal   Collection Time: 03/18/22  2:15 AM  Result Value Ref Range   Sodium 133 (L) 135 - 145 mmol/L   Potassium 3.3 (L) 3.5 - 5.1 mmol/L   Chloride 99 98 - 111 mmol/L   CO2 23 22 - 32 mmol/L   Glucose, Bld 267 (H) 70 - 99 mg/dL    Comment: Glucose reference  range applies only to samples taken after fasting for at least 8 hours.   BUN 8 6 - 20 mg/dL   Creatinine, Ser 0.82 0.44 - 1.00 mg/dL   Calcium 8.8 (L) 8.9 - 10.3 mg/dL   Total Protein 6.5 6.5 - 8.1 g/dL   Albumin 3.6 3.5 - 5.0 g/dL   AST 21 15 - 41 U/L   ALT 34 0 - 44 U/L   Alkaline Phosphatase 91 38 - 126 U/L   Total Bilirubin 0.4 0.3 - 1.2 mg/dL   GFR, Estimated >60 >60 mL/min    Comment: (NOTE) Calculated using the CKD-EPI Creatinine Equation (2021)    Anion gap 11 5 - 15    Comment: Performed at [facility] [address] [place], [place] [address]  POC CBG, ED     Status: Abnormal   Collection Time: 03/18/22  2:20 AM  Result Value Ref Range   Glucose-Capillary 260 (H) 70 - 99 mg/dL    Comment: Glucose reference range applies only to samples taken after fasting for at least 8  hours.  CBG monitoring, ED     Status: Abnormal   Collection Time: 03/18/22  8:28 AM  Result Value Ref Range   Glucose-Capillary 373 (H) 70 - 99 mg/dL    Comment: Glucose reference range applies only to samples taken after fasting for at least 8 hours.   No results found.  Pending Labs Unresulted Labs (From admission, onward)     Start     Ordered   03/18/22 0807  HIV Antibody (routine testing w rflx)  (HIV Antibody (Routine testing w reflex) panel)  Add-on,   AD        03/18/22 0807            Vitals/Pain Today's Vitals   03/18/22 0500 03/18/22 0645 03/18/22 0657 03/18/22 0715  BP: (!) 136/108 (!) 141/99 (!) 135/90 123/79  Pulse: 75 79 74 67  Resp: 17     Temp:      TempSrc:      SpO2: 94% 96% 97% 94%  Weight:      Height:      PainSc:   3      Isolation Precautions No active isolations  Medications Medications  enoxaparin (LOVENOX) injection 40 mg (has no administration in time range)  senna-docusate (Senokot-S) tablet 1 tablet (has no administration in time range)  insulin aspart (novoLOG) injection 0-15 Units (has no administration in time range)  oxyCODONE (Oxy IR/ROXICODONE) immediate release tablet 5 mg (has no administration in time range)  HYDROmorphone (DILAUDID) injection 0.5 mg (has no administration in time range)  acetaminophen (TYLENOL) tablet 1,000 mg (has no administration in time range)  insulin glargine-yfgn (SEMGLEE) injection 20 Units (has no administration in time range)  naproxen (NAPROSYN) tablet 500 mg (has no administration in time range)  HYDROmorphone (DILAUDID) injection 1 mg (1 mg Intravenous Given 03/18/22 0224)  ketorolac (TORADOL) 15 MG/ML injection 15 mg (15 mg Intravenous Given 03/18/22 0224)  dexamethasone (DECADRON) injection 10 mg (10 mg Intravenous Given 03/18/22 0224)  HYDROmorphone (DILAUDID) injection 1 mg (1 mg Intravenous Given 03/18/22 0448)    Mobility walks     Focused Assessments Back pain   R Recommendations:  See Admitting Provider Note  Report given to:   Additional Notes: Pt is AOX4, walky/talky, CBG 373, able to verbalize needs.  Will give morning meds that are scheduled at this time.

## 2022-03-18 NOTE — Progress Notes (Signed)
Talked with OC MD about CBG of 458 without coverage. Md will put in new orders for CBG coverage will continue to monitor pt.

## 2022-03-18 NOTE — ED Notes (Signed)
Patient leaving the floor in stable condition, AOX4, with her belongings and staff.

## 2022-03-19 DIAGNOSIS — E785 Hyperlipidemia, unspecified: Secondary | ICD-10-CM | POA: Diagnosis present

## 2022-03-19 DIAGNOSIS — Z7901 Long term (current) use of anticoagulants: Secondary | ICD-10-CM | POA: Diagnosis not present

## 2022-03-19 DIAGNOSIS — T380X5A Adverse effect of glucocorticoids and synthetic analogues, initial encounter: Secondary | ICD-10-CM | POA: Diagnosis present

## 2022-03-19 DIAGNOSIS — M5116 Intervertebral disc disorders with radiculopathy, lumbar region: Secondary | ICD-10-CM | POA: Diagnosis present

## 2022-03-19 DIAGNOSIS — M545 Low back pain, unspecified: Secondary | ICD-10-CM | POA: Diagnosis present

## 2022-03-19 DIAGNOSIS — M5136 Other intervertebral disc degeneration, lumbar region: Secondary | ICD-10-CM | POA: Diagnosis not present

## 2022-03-19 DIAGNOSIS — E876 Hypokalemia: Secondary | ICD-10-CM | POA: Diagnosis present

## 2022-03-19 DIAGNOSIS — E1165 Type 2 diabetes mellitus with hyperglycemia: Secondary | ICD-10-CM | POA: Diagnosis present

## 2022-03-19 DIAGNOSIS — Z800 Family history of malignant neoplasm of digestive organs: Secondary | ICD-10-CM | POA: Diagnosis not present

## 2022-03-19 DIAGNOSIS — G8929 Other chronic pain: Secondary | ICD-10-CM | POA: Diagnosis present

## 2022-03-19 DIAGNOSIS — M4800 Spinal stenosis, site unspecified: Secondary | ICD-10-CM | POA: Diagnosis not present

## 2022-03-19 DIAGNOSIS — Z79899 Other long term (current) drug therapy: Secondary | ICD-10-CM | POA: Diagnosis not present

## 2022-03-19 DIAGNOSIS — Z794 Long term (current) use of insulin: Secondary | ICD-10-CM | POA: Diagnosis not present

## 2022-03-19 DIAGNOSIS — F209 Schizophrenia, unspecified: Secondary | ICD-10-CM | POA: Diagnosis present

## 2022-03-19 DIAGNOSIS — Z833 Family history of diabetes mellitus: Secondary | ICD-10-CM | POA: Diagnosis not present

## 2022-03-19 DIAGNOSIS — M48061 Spinal stenosis, lumbar region without neurogenic claudication: Secondary | ICD-10-CM | POA: Diagnosis present

## 2022-03-19 LAB — GLUCOSE, CAPILLARY
Glucose-Capillary: 418 mg/dL — ABNORMAL HIGH (ref 70–99)
Glucose-Capillary: 267 mg/dL — ABNORMAL HIGH (ref 70–99)
Glucose-Capillary: 298 mg/dL — ABNORMAL HIGH (ref 70–99)
Glucose-Capillary: 333 mg/dL — ABNORMAL HIGH (ref 70–99)
Glucose-Capillary: 391 mg/dL — ABNORMAL HIGH (ref 70–99)
Glucose-Capillary: 313 mg/dL — ABNORMAL HIGH (ref 70–99)

## 2022-03-19 LAB — BASIC METABOLIC PANEL
Anion gap: 10 (ref 5–15)
BUN: 12 mg/dL (ref 6–20)
CO2: 20 mmol/L — ABNORMAL LOW (ref 22–32)
Calcium: 8.8 mg/dL — ABNORMAL LOW (ref 8.9–10.3)
Chloride: 99 mmol/L (ref 98–111)
Creatinine, Ser: 0.76 mg/dL (ref 0.44–1.00)
GFR, Estimated: >60 mL/min (ref >60)
Glucose, Bld: 384 mg/dL — ABNORMAL HIGH (ref 70–99)
Potassium: 4.2 mmol/L (ref 3.5–5.1)
Sodium: 129 mmol/L — ABNORMAL LOW (ref 135–145)

## 2022-03-19 LAB — HIV ANTIBODY (ROUTINE TESTING W REFLEX): HIV Screen 4th Generation wRfx: NONREACTIVE

## 2022-03-19 LAB — MAGNESIUM: Magnesium: 1.6 mg/dL — ABNORMAL LOW (ref 1.7–2.4)

## 2022-03-19 MED ORDER — KETOROLAC TROMETHAMINE 30 MG/ML IJ SOLN
30.0000 mg | Freq: Four times a day (QID) | INTRAMUSCULAR | Status: AC
  Administered 2022-03-19 – 2022-03-20 (×4): 30 mg via INTRAVENOUS
  Filled 2022-03-19 (×4): qty 1

## 2022-03-19 MED ORDER — INSULIN ASPART 100 UNIT/ML IJ SOLN
10.0000 [IU] | Freq: Once | INTRAMUSCULAR | Status: AC
  Administered 2022-03-19: 10 [IU] via SUBCUTANEOUS

## 2022-03-19 MED ORDER — MAGNESIUM SULFATE 2 GM/50ML IV SOLN
2.0000 g | Freq: Once | INTRAVENOUS | Status: AC
  Administered 2022-03-19: 2 g via INTRAVENOUS
  Filled 2022-03-19: qty 50

## 2022-03-19 MED ORDER — LIDOCAINE 5 % EX PTCH
1.0000 | MEDICATED_PATCH | CUTANEOUS | Status: DC
  Administered 2022-03-19 – 2022-03-21 (×3): 1 via TRANSDERMAL
  Filled 2022-03-19 (×3): qty 1

## 2022-03-19 MED ORDER — POLYETHYLENE GLYCOL 3350 17 G PO PACK: 17.0000 g | PACK | Freq: Every day | ORAL | Status: DC | PRN

## 2022-03-19 MED ORDER — INSULIN GLARGINE-YFGN 100 UNIT/ML ~~LOC~~ SOLN
10.0000 [IU] | Freq: Once | SUBCUTANEOUS | Status: AC
  Administered 2022-03-19: 10 [IU] via SUBCUTANEOUS
  Filled 2022-03-19: qty 0.1

## 2022-03-19 MED ORDER — GABAPENTIN 300 MG PO CAPS
300.0000 mg | ORAL_CAPSULE | Freq: Every day | ORAL | Status: DC
  Administered 2022-03-19 – 2022-03-20 (×2): 300 mg via ORAL
  Filled 2022-03-19 (×2): qty 1

## 2022-03-19 MED ORDER — INSULIN GLARGINE-YFGN 100 UNIT/ML ~~LOC~~ SOLN
30.0000 [IU] | Freq: Every day | SUBCUTANEOUS | Status: DC
  Administered 2022-03-20 – 2022-03-21 (×2): 30 [IU] via SUBCUTANEOUS
  Filled 2022-03-19 (×2): qty 0.3

## 2022-03-19 NOTE — Evaluation (Signed)
Occupational Therapy Evaluation Patient Details Name: Melissa Gilmore MRN: [id number] DOB: April 27, 1995 Today's Date: 03/19/2022   History of Present Illness Pt is a 27 y/o F presenting to ED on 2/10 with low back pain, abdominal pain, and dizziness x3 days. Near syncopal episodes in ED. MRI of lumbar spine revealing disc degeneration at L2-3, L3-4. PMH includes depression, DM, dissociative identity disorder, and schizophrenia   Clinical Impression   Pt reports independence at baseline with ADLs/functional mobility, although in the past few weeks has been having assist for LB ADLs and has been using a rollator for mobility. Pt lives with her mom, sister, and boyfriend who have been assisting. Pt currently reporting 9/10 back pain, able to complete ADLs with set up -mod A, supervision for bed mobility, and min A +2 HHA for transfer to [facility] and short distance ambulation in room. Pt able to stand x3-4 mins for grooming task before needed seated rest break due to pain. Pt presenting with impairments listed below, will follow acutely. Anticipate pt will progress well once pain controlled, no OT follow up needs at d/c pending progression.      Recommendations for follow up therapy are one component of a multi-disciplinary discharge planning process, led by the attending physician.  Recommendations may be updated based on patient status, additional functional criteria and insurance authorization.   Follow Up Recommendations  No OT follow up     Assistance Recommended at Discharge Intermittent Supervision/Assistance  Patient can return home with the following A little help with walking and/or transfers;A little help with bathing/dressing/bathroom;Assistance with cooking/housework;Direct supervision/assist for medications management;Direct supervision/assist for financial management;Help with stairs or ramp for entrance    Functional Status Assessment  Patient has had a recent decline in their  functional status and demonstrates the ability to make significant improvements in function in a reasonable and predictable amount of time.  Equipment Recommendations  BSC/3in1 (as shower seat)    Recommendations for Other Services PT consult     Precautions / Restrictions Precautions Precautions: Fall Restrictions Weight Bearing Restrictions: No      Mobility Bed Mobility Overal bed mobility: Needs Assistance Bed Mobility: Sidelying to Sit   Sidelying to sit: Supervision Supine to sit: Supervision          Transfers Overall transfer level: Needs assistance Equipment used: 2 person hand held assist (no RW in pt's room) Transfers: Sit to/from Stand, Bed to chair/wheelchair/BSC Sit to Stand: Min assist, +2 physical assistance     Step pivot transfers: Min assist, +2 physical assistance            Balance Overall balance assessment: Mild deficits observed, not formally tested                                         ADL either performed or assessed with clinical judgement   ADL Overall ADL's : Needs assistance/impaired Eating/Feeding: Set up   Grooming: Set up;Sitting;Standing;Oral care   Upper Body Bathing: Minimal assistance;Sitting;Standing   Lower Body Bathing: Moderate assistance;Sit to/from stand;Sitting/lateral leans   Upper Body Dressing : Minimal assistance;Sitting   Lower Body Dressing: Moderate assistance;Sitting/lateral leans Lower Body Dressing Details (indicate cue type and reason): pulling up sock, pulling up pants after using bathroom Toilet Transfer: Minimal assistance;+2 for physical assistance;Stand-pivot;BSC/3in1   Toileting- Clothing Manipulation and Hygiene: Min guard Toileting - Clothing Manipulation Details (indicate cue type and reason): pt  completing pericare     Functional mobility during ADLs: Minimal assistance;+2 for physical assistance       Vision   Vision Assessment?: No apparent visual deficits      Perception Perception Perception Tested?: No   Praxis Praxis Praxis tested?: Not tested    Pertinent Vitals/Pain Pain Assessment Pain Assessment: Faces Pain Score: 9  Faces Pain Scale: Hurts whole lot Pain Location: low back Pain Descriptors / Indicators: Discomfort, Constant Pain Intervention(s): Limited activity within patient's tolerance, Monitored during session, Premedicated before session, Repositioned     Hand Dominance     Extremity/Trunk Assessment Upper Extremity Assessment Upper Extremity Assessment: Generalized weakness   Lower Extremity Assessment Lower Extremity Assessment: Defer to PT evaluation   Cervical / Trunk Assessment Cervical / Trunk Assessment: Other exceptions (unable to come to upright posture given back pain)   Communication Communication Communication: No difficulties   Cognition Arousal/Alertness: Awake/alert Behavior During Therapy: WFL for tasks assessed/performed Overall Cognitive Status: Within Functional Limits for tasks assessed                                       General Comments  VSS on RA; boyfriend present and supportive    Exercises     Shoulder Instructions      Home Living Family/patient expects to be discharged to:: Private residence Living Arrangements: Spouse/significant other;Other (Comment) (mom, sister, and boyrfriend)   Type of Home: House Home Access: Stairs to enter [employer] of Steps: 1   Home Layout: One level     Bathroom Shower/Tub: Walk-in shower (small step)         Home Equipment: Rollator (4 wheels)          Prior Functioning/Environment Prior Level of Function : Needs assist             Mobility Comments: used Rollator ADLs Comments: sister assisted with ADLs        OT Problem List: Decreased strength;Decreased range of motion;Decreased activity tolerance;Impaired balance (sitting and/or standing);Impaired sensation      OT  Treatment/Interventions: Self-care/ADL training;Therapeutic exercise;Energy conservation;DME and/or AE instruction;Therapeutic activities;Patient/family education;Balance training    OT Goals(Current goals can be found in the care plan section) Acute Rehab OT Goals Patient Stated Goal: none stated OT Goal Formulation: With patient Time For Goal Achievement: 04/02/22 Potential to Achieve Goals: Good ADL Goals Pt Will Perform Upper Body Dressing: with modified independence;sitting Pt Will Perform Lower Body Dressing: with modified independence;sit to/from stand;sitting/lateral leans Pt Will Transfer to Toilet: with modified independence;ambulating;regular height toilet Pt Will Perform Tub/Shower Transfer: Shower transfer;with modified independence;ambulating Additional ADL Goal #1: pt will be able to stand x10 min for functional task in prep for ADLs  OT Frequency: Min 2X/week    Co-evaluation              AM-PAC OT "6 Clicks" Daily Activity     Outcome Measure Help from another person eating meals?: None Help from another person taking care of personal grooming?: A Little Help from another person toileting, which includes using toliet, bedpan, or urinal?: A Little Help from another person bathing (including washing, rinsing, drying)?: A Lot Help from another person to put on and taking off regular upper body clothing?: A Little Help from another person to put on and taking off regular lower body clothing?: A Lot 6 Click Score: 17   End of Session Equipment Utilized During  Treatment: Gait belt Nurse Communication: Mobility status  Activity Tolerance: Patient tolerated treatment well Patient left: in bed;with call bell/phone within reach;with bed alarm set;with family/visitor present  OT Visit Diagnosis: Unsteadiness on feet (R26.81);Other abnormalities of gait and mobility (R26.89);Muscle weakness (generalized) (M62.81)                Time: [id number] OT Time Calculation (min):  26 min Charges:  OT General Charges $OT Visit: 1 Visit OT Evaluation $OT Eval Moderate Complexity: 1 Mod OT Treatments $Self Care/Home Management : 8-22 mins  Renaye Rakers, OTD, OTR/L SecureChat Preferred Acute Rehab [phone]   Ulla Gallo 03/19/2022, 12:50 PM

## 2022-03-19 NOTE — Plan of Care (Signed)

## 2022-03-19 NOTE — Evaluation (Signed)
Physical Therapy Evaluation Patient Details Name: Melissa Gilmore MRN: [id number] DOB: May 19, 1995 Today's Date: 03/19/2022  History of Present Illness  Pt is a 27 y/o F presenting to ED on 2/10 with low back pain, abdominal pain, and dizziness x3 days. Near syncopal episodes in ED. MRI of lumbar spine revealing disc degeneration at L2-3, L3-4. PMH includes depression, DM, dissociative identity disorder, and schizophrenia  Clinical Impression  Pt admitted with above. Reporting 9/10 central low back pain with numbness in BLE's. Pt with decreased light touch sensation bilaterally and decreased deep touch sensation on RLE. Presents with gross BLE weakness and resulting gait abnormalities. Doesn't seem to have a directional preference, although does report walking or being in upright, extended position doesn't make pain worse. No hamstring/piriformis/quad tightness noted. Pt ambulating 150 with and without walker at a min guard assist level. Provided HEP for TA bracing, core activation, lumbar extension and BLE strengthening. Will greatly benefit from OPPT to address deficits.     Recommendations for follow up therapy are one component of a multi-disciplinary discharge planning process, led by the attending physician.  Recommendations may be updated based on patient status, additional functional criteria and insurance authorization.  Follow Up Recommendations Outpatient PT      Assistance Recommended at Discharge PRN  Patient can return home with the following  Assistance with cooking/housework;Assist for transportation;Help with stairs or ramp for entrance    Equipment Recommendations None recommended by PT  Recommendations for Other Services       Functional Status Assessment Patient has had a recent decline in their functional status and demonstrates the ability to make significant improvements in function in a reasonable and predictable amount of time.     Precautions /  Restrictions Precautions Precautions: Fall Restrictions Weight Bearing Restrictions: No      Mobility  Bed Mobility Overal bed mobility: Needs Assistance Bed Mobility: Sidelying to Sit   Sidelying to sit: Min assist       General bed mobility comments: Pt significant other assisting pt to sit up on the edge of bed. Pt able to transition into prone position with increased time    Transfers Overall transfer level: Needs assistance Equipment used: Rolling walker (2 wheels), None Transfers: Sit to/from Stand Sit to Stand: Min guard                Ambulation/Gait Ambulation/Gait assistance: Min guard Gait Distance (Feet): 150 Feet Assistive device: Rolling walker (2 wheels), 1 person hand held assist Gait Pattern/deviations: Step-through pattern, Decreased stride length, Decreased dorsiflexion - right, Decreased dorsiflexion - left, Wide base of support       General Gait Details: Decreased bilateral foot clearance, knee flexion during swing phase, wider BOS. Moderate reliance through arms on walker  Stairs            Wheelchair Mobility    Modified Rankin (Stroke Patients Only)       Balance Overall balance assessment: Mild deficits observed, not formally tested                                           Pertinent Vitals/Pain Pain Assessment Pain Assessment: 0-10 Pain Score: 9  Pain Location: low back Pain Descriptors / Indicators: Discomfort, Constant Pain Intervention(s): Monitored during session    Home Living Family/patient expects to be discharged to:: Private residence Living Arrangements: Spouse/significant other;Other (Comment) (mom, sister,  and boyrfriend)   Type of Home: House Home Access: Stairs to enter   [employer] of Steps: 1   Home Layout: One level Home Equipment: Rollator (4 wheels)      Prior Function Prior Level of Function : Needs assist             Mobility Comments: used  Rollator ADLs Comments: sister assisted with ADLs     Hand Dominance        Extremity/Trunk Assessment   Upper Extremity Assessment Upper Extremity Assessment: Defer to OT evaluation    Lower Extremity Assessment Lower Extremity Assessment: RLE deficits/detail;LLE deficits/detail RLE Deficits / Details: Grossly 3-/5 within limited range RLE Sensation: decreased light touch LLE Deficits / Details: Grossly 3+/5 within limited range LLE Sensation: decreased light touch       Communication   Communication: No difficulties  Cognition Arousal/Alertness: Awake/alert Behavior During Therapy: WFL for tasks assessed/performed Overall Cognitive Status: Within Functional Limits for tasks assessed                                          General Comments      Exercises     Assessment/Plan    PT Assessment Patient needs continued PT services  PT Problem List Decreased strength;Decreased activity tolerance;Decreased balance;Decreased mobility;Pain;Obesity       PT Treatment Interventions DME instruction;Gait training;Stair training;Functional mobility training;Therapeutic activities;Therapeutic exercise;Balance training;Patient/family education    PT Goals (Current goals can be found in the Care Plan section)  Acute Rehab PT Goals Patient Stated Goal: less pain PT Goal Formulation: With patient Time For Goal Achievement: 04/02/22 Potential to Achieve Goals: Good    Frequency Min 3X/week     Co-evaluation               AM-PAC PT "6 Clicks" Mobility  Outcome Measure Help needed turning from your back to your side while in a flat bed without using bedrails?: A Little Help needed moving from lying on your back to sitting on the side of a flat bed without using bedrails?: A Little Help needed moving to and from a bed to a chair (including a wheelchair)?: A Little Help needed standing up from a chair using your arms (e.g., wheelchair or bedside  chair)?: A Little Help needed to walk in hospital room?: A Little Help needed climbing 3-5 steps with a railing? : A Little 6 Click Score: 18    End of Session   Activity Tolerance: Patient tolerated treatment well Patient left: in chair;with call bell/phone within reach;with family/visitor present Nurse Communication: Mobility status PT Visit Diagnosis: Unsteadiness on feet (R26.81);Other abnormalities of gait and mobility (R26.89);Difficulty in walking, not elsewhere classified (R26.2);Pain Pain - part of body:  (back)    Time: 1340-1410 PT Time Calculation (min) (ACUTE ONLY): 30 min   Charges:   PT Evaluation $PT Eval Low Complexity: 1 Low PT Treatments $Therapeutic Activity: 8-22 mins        Wyona Almas, PT, DPT Acute Rehabilitation Services Office [phone]   Deno Etienne 03/19/2022, 4:22 PM

## 2022-03-19 NOTE — Progress Notes (Addendum)
Patient ID: Melissa Gilmore, female   DOB: 1995-12-08, 27 y.o.   MRN: [id number] Unfortunately, she continues to have significant back pain.  She complains of some numbness in both legs.  She is got some pain in her thighs.  Started IV Decadron yesterday.  I think she is a candidate for a L3-4 epidural steroid injection.  May try some Toradol.  PT OT.  She has no weakness on exam.  Obviously, we are trying to avoid operating on her 27 year old whose only had pain for a couple of weeks and has a normal neuroexam.  I see that her CBG went up to 454 on steroids.  The Decadron was stopped.  Will try some Toradol today.

## 2022-03-19 NOTE — Progress Notes (Signed)
HD#0 Subjective:   Summary: Melissa Gilmore is a 27 y.o. female with PMH of T2DM, dyslipidemia, depression who presents with worsening low back pain and admitted for acute on chronic lumbar pain with new neuro deficits.  Overnight Events: hyperglycemia of 400s, 35 units of aspart given  Patient states continued midline lumbar pain that extends down both legs. Initially was pain down right leg but now both. Movement of her legs aggravate the back pain. Pain medications help briefly then the pain returns gradually. Difficulty getting out of bed and to the bathroom. No urinary retention or incontinence, saddle anesthesia or stool incontinence.   Objective:  Vital signs in last 24 hours: Vitals:   03/18/22 1535 03/18/22 2023 03/19/22 0000 03/19/22 0503  BP: 117/80 125/85 (!) 153/95 115/75  Pulse: 68 82  65  Resp:  16  16  Temp: 98.6 F (37 C) 98.6 F (37 C)  98.6 F (37 C)  TempSrc:  Oral    SpO2: 99% 99%  98%  Weight:      Height:       Supplemental O2: Room Air SpO2: 98 %   Physical Exam:  Constitutional: well-appearing female laying in bed, in mild distress Cardiovascular: regular rate Pulmonary/Chest: normal work of breathing on room air MSK: normal bulk and tone Neurological: alert & oriented x 3, decreased sensation of bilateral LE, unable to fully assess LE strength due to pain Skin: warm and dry Psych: anxious mood  Filed Weights   03/18/22 0051 03/18/22 0944  Weight: 100 kg 100 kg    No intake or output data in the 24 hours ending 03/19/22 0601 Net IO Since Admission: No IO data has been entered for this period [03/19/22 0601]  Pertinent Labs:    Latest Ref Rng & Units 03/18/2022    2:15 AM 03/15/2022   10:57 PM 03/14/2022    1:58 PM  CBC  WBC 4.0 - 10.5 K/uL 9.6  6.8  7.6   Hemoglobin 12.0 - 15.0 g/dL 13.5  12.5  13.9   Hematocrit 36.0 - 46.0 % 38.3  36.9  40.6   Platelets 150 - 400 K/uL 249  232  265        Latest Ref Rng & Units 03/18/2022     2:15 AM 03/15/2022   10:57 PM 03/14/2022    1:58 PM  CMP  Glucose 70 - 99 mg/dL 267  304  262   BUN 6 - 20 mg/dL 8  12  9   $ Creatinine 0.44 - 1.00 mg/dL 0.82  0.87  0.62   Sodium 135 - 145 mmol/L 133  137  136   Potassium 3.5 - 5.1 mmol/L 3.3  3.8  3.7   Chloride 98 - 111 mmol/L 99  105  104   CO2 22 - 32 mmol/L 23  23  23   $ Calcium 8.9 - 10.3 mg/dL 8.8  8.8  8.9   Total Protein 6.5 - 8.1 g/dL 6.5  6.2  7.5   Total Bilirubin 0.3 - 1.2 mg/dL 0.4  0.5  0.6   Alkaline Phos 38 - 126 U/L 91  67  70   AST 15 - 41 U/L 21  20  21   $ ALT 0 - 44 U/L 34  22  25     Imaging: No results found.  Assessment/Plan:   Principal Problem:   Acute lumbar back pain   Patient Summary: Melissa Gilmore is a 27 y.o. with MH  of T2DM, dyslipidemia, depression who presents with worsening low back pain and admitted for acute on chronic lumbar pain with new neuro deficits.  Acute on chronic lumbar back pain L2-L3 disc degeneration  Spinal stenosis, R lateral recess stenosis Pain is not well controlled. Still has midline lumbar pain with pain shooting down both legs. Difficulty with ambulating. She was taking tylenol and ibuprofen at home without much relief. No other medications before. Appears to be neuropathic pain, will start gabapentin to see if it helps. Neurosurgery recommends epidural steroid injection given uncontrolled pain, will consult IR for this.  -appreciate neurosurgery assistance -IR consult for epidural steroid injection -start lidocaine patch  -start gabapentin 300 mg at bedtime -tylenol 1000 mg q8h -toradol 30 mg q6 x 5 doses -oxycodone 5 mg q4h PRN moderate -IV dilaudid 0.5 mg q4h PRN severe, breakthrough -on bowel regimen -PT/OT eval  Syncope Patient states multiple syncopal episodes that is associated with severe pain. Appears to be vasovagal in nature as she gets flushed feeling when her back pain is severe. No other prodromal symptoms. Will continue addressing her  pain.   T2DM A1c 11.4%. On long acting insulin 20 units and daily Victoza. Was started on decadron which has caused hyperglycemia. Last dose of decadron this morning. Will increase long acting for now on steroids.  -increase semglee to 30 units daily  -on SSI (resistant) with meals and at bedtime  Hypokalemia Hypomagnesemia  Potassium improved after repletion. Magnesium 1.6 today. Will replete today. -replete electrolytes as needed -BMP and magnesium level  Diet: Carb-Modified IVF: None,None VTE: Enoxaparin Code: Full PT/OT recs: Pending Family Update: boyfriend at bedside this morning  Dispo: Anticipated discharge to Home pending pain control and glycemic control  Angelique Blonder, DO Internal Medicine Resident PGY-1 Pager: [phone] Please contact the on call pager after 5 pm and on weekends at [phone].

## 2022-03-20 ENCOUNTER — Ambulatory Visit: Payer: Medicaid Other | Admitting: Family Medicine

## 2022-03-20 DIAGNOSIS — M545 Low back pain, unspecified: Secondary | ICD-10-CM | POA: Diagnosis not present

## 2022-03-20 DIAGNOSIS — M5136 Other intervertebral disc degeneration, lumbar region: Secondary | ICD-10-CM | POA: Diagnosis not present

## 2022-03-20 DIAGNOSIS — M4800 Spinal stenosis, site unspecified: Secondary | ICD-10-CM | POA: Diagnosis not present

## 2022-03-20 LAB — BASIC METABOLIC PANEL
Anion gap: 11 (ref 5–15)
BUN: 12 mg/dL (ref 6–20)
CO2: 22 mmol/L (ref 22–32)
Calcium: 8.5 mg/dL — ABNORMAL LOW (ref 8.9–10.3)
Chloride: 101 mmol/L (ref 98–111)
Creatinine, Ser: 0.77 mg/dL (ref 0.44–1.00)
GFR, Estimated: >60 mL/min (ref >60)
Glucose, Bld: 174 mg/dL — ABNORMAL HIGH (ref 70–99)
Potassium: 3.4 mmol/L — ABNORMAL LOW (ref 3.5–5.1)
Sodium: 134 mmol/L — ABNORMAL LOW (ref 135–145)

## 2022-03-20 LAB — MISC LABCORP TEST (SEND OUT): Labcorp test code: 81950

## 2022-03-20 LAB — GLUCOSE, CAPILLARY
Glucose-Capillary: 171 mg/dL — ABNORMAL HIGH (ref 70–99)
Glucose-Capillary: 272 mg/dL — ABNORMAL HIGH (ref 70–99)
Glucose-Capillary: 248 mg/dL — ABNORMAL HIGH (ref 70–99)
Glucose-Capillary: 144 mg/dL — ABNORMAL HIGH (ref 70–99)

## 2022-03-20 LAB — MAGNESIUM: Magnesium: 1.7 mg/dL (ref 1.7–2.4)

## 2022-03-20 MED ORDER — ENOXAPARIN SODIUM 60 MG/0.6ML IJ SOSY: 50.0000 mg | PREFILLED_SYRINGE | Freq: Every day | INTRAMUSCULAR | Status: DC

## 2022-03-20 MED ORDER — POTASSIUM CHLORIDE CRYS ER 20 MEQ PO TBCR
40.0000 meq | EXTENDED_RELEASE_TABLET | Freq: Once | ORAL | Status: AC
  Administered 2022-03-20: 40 meq via ORAL
  Filled 2022-03-20: qty 2

## 2022-03-20 MED ORDER — SENNOSIDES-DOCUSATE SODIUM 8.6-50 MG PO TABS
2.0000 | ORAL_TABLET | Freq: Every day | ORAL | Status: DC
  Administered 2022-03-20: 2 via ORAL
  Filled 2022-03-20: qty 2

## 2022-03-20 MED ORDER — POLYETHYLENE GLYCOL 3350 17 G PO PACK
17.0000 g | PACK | Freq: Every day | ORAL | Status: DC
  Administered 2022-03-20: 17 g via ORAL
  Filled 2022-03-20 (×2): qty 1

## 2022-03-20 MED ORDER — KETOROLAC TROMETHAMINE 30 MG/ML IJ SOLN
30.0000 mg | Freq: Four times a day (QID) | INTRAMUSCULAR | Status: AC
  Administered 2022-03-20 – 2022-03-21 (×4): 30 mg via INTRAVENOUS
  Filled 2022-03-20 (×4): qty 1

## 2022-03-20 NOTE — Progress Notes (Signed)
Occupational Therapy Treatment Patient Details Name: Melissa Gilmore MRN: [id number] DOB: Oct 07, 1995 Today's Date: 03/20/2022   History of present illness Pt is a 27 y/o F presenting to ED on 2/10 with low back pain, abdominal pain, and dizziness x3 days. Near syncopal episodes in ED. MRI of lumbar spine revealing disc degeneration at L2-3, L3-4. PMH includes depression, DM, dissociative identity disorder, and schizophrenia   OT comments  Patient exiting bathroom upon entry. Patient provided education on adaptive equipment use for LB dressing with reacher, dressing stick, and sock aide. Patient was able to donn socks and pants with AE and min assist. Patient performed functional mobility in hallway with min guard and no LOB. Patient making good gains with OT and will continue to follow.    Recommendations for follow up therapy are one component of a multi-disciplinary discharge planning process, led by the attending physician.  Recommendations may be updated based on patient status, additional functional criteria and insurance authorization.    Follow Up Recommendations  No OT follow up     Assistance Recommended at Discharge Intermittent Supervision/Assistance  Patient can return home with the following  A little help with walking and/or transfers;A little help with bathing/dressing/bathroom;Assistance with cooking/housework;Direct supervision/assist for medications management;Direct supervision/assist for financial management;Help with stairs or ramp for entrance   Equipment Recommendations  BSC/3in1 (as shower seat)    Recommendations for Other Services      Precautions / Restrictions Precautions Precautions: Fall Restrictions Weight Bearing Restrictions: No       Mobility Bed Mobility Overal bed mobility: Needs Assistance             General bed mobility comments: OOB upon entry    Transfers Overall transfer level: Needs assistance Equipment used: Rolling  walker (2 wheels) Transfers: Sit to/from Stand Sit to Stand: Min guard, Supervision           General transfer comment: min guard to supervision for transfers for safety     Balance Overall balance assessment: Mild deficits observed, not formally tested                                         ADL either performed or assessed with clinical judgement   ADL Overall ADL's : Needs assistance/impaired     Grooming: Wash/dry hands;Supervision/safety;Standing Grooming Details (indicate cue type and reason): at sink             Lower Body Dressing: Minimal assistance;Sitting/lateral leans;With adaptive equipment Lower Body Dressing Details (indicate cue type and reason): instructions on reacher, dressing stick, and sock aide use for LB dressing patient patient return demonstration and min assist to donn pants and socks Toilet Transfer: Supervision/safety             General ADL Comments: patient leaving bathroom upon entry    Extremity/Trunk Assessment              Vision       Perception     Praxis      Cognition Arousal/Alertness: Awake/alert Behavior During Therapy: WFL for tasks assessed/performed Overall Cognitive Status: Within Functional Limits for tasks assessed                                          Exercises  Shoulder Instructions       General Comments      Pertinent Vitals/ Pain       Pain Assessment Pain Assessment: Faces Faces Pain Scale: Hurts little more Pain Location: low back Pain Descriptors / Indicators: Discomfort, Constant Pain Intervention(s): Monitored during session, Repositioned  Home Living                                          Prior Functioning/Environment              Frequency  Min 2X/week        Progress Toward Goals  OT Goals(current goals can now be found in the care plan section)  Progress towards OT goals: Progressing toward  goals  Acute Rehab OT Goals Patient Stated Goal: go home OT Goal Formulation: With patient Time For Goal Achievement: 04/02/22 Potential to Achieve Goals: Good ADL Goals Pt Will Perform Upper Body Dressing: with modified independence;sitting Pt Will Perform Lower Body Dressing: with modified independence;sit to/from stand;sitting/lateral leans Pt Will Transfer to Toilet: with modified independence;ambulating;regular height toilet Pt Will Perform Tub/Shower Transfer: Shower transfer;with modified independence;ambulating Additional ADL Goal #1: pt will be able to stand x10 min for functional task in prep for ADLs  Plan Discharge plan remains appropriate    Co-evaluation                 AM-PAC OT "6 Clicks" Daily Activity     Outcome Measure   Help from another person eating meals?: None Help from another person taking care of personal grooming?: A Little Help from another person toileting, which includes using toliet, bedpan, or urinal?: A Little Help from another person bathing (including washing, rinsing, drying)?: A Little Help from another person to put on and taking off regular upper body clothing?: A Little Help from another person to put on and taking off regular lower body clothing?: A Little 6 Click Score: 19    End of Session Equipment Utilized During Treatment: Rolling walker (2 wheels);Gait belt  OT Visit Diagnosis: Unsteadiness on feet (R26.81);Other abnormalities of gait and mobility (R26.89);Muscle weakness (generalized) (M62.81)   Activity Tolerance Patient tolerated treatment well   Patient Left in chair;with call bell/phone within reach   Nurse Communication Mobility status        Time: [id number] OT Time Calculation (min): 34 min  Charges: OT General Charges $OT Visit: 1 Visit OT Treatments $Self Care/Home Management : 8-22 mins $Therapeutic Activity: 8-22 mins  Lodema Hong, OTA Acute Rehabilitation Services  Office [place] 03/20/2022, 1:56 PM

## 2022-03-20 NOTE — Progress Notes (Signed)
HD#1 Subjective:   Summary: Melissa Gilmore is a 27 y.o. female with PMH of T2DM, dyslipidemia, depression who presents with worsening low back pain and admitted for acute on chronic lumbar pain with new neuro deficits.  Overnight Events:   Outpatient PT, no OT follow-up.  NAEON.  Awaiting epidural steroid injection.  States that her pain is still present. Medications help her move around but pain comes back as soon as medications wear off. IV pain meds help more than the oral tablets. She has not had any more episodes of losing consciousness but does have episodes of dizziness. Last syncopal episode this past Saturday. Only gets dizzy when she is standing up.  She has pain and numbness on the anterior and lateral parts of her legs.  Her R leg still feels somewhat numb and she has to drag it when she moves.   Objective:  Vital signs in last 24 hours: Vitals:   03/19/22 0503 03/19/22 2026 03/20/22 0340 03/20/22 0613  BP: 115/75 123/81 118/84 111/80  Pulse: 65 78 67 69  Resp: 16 18 16 18  $ Temp: 98.6 F (37 C) 98.2 F (36.8 C) 98.9 F (37.2 C) 98.4 F (36.9 C)  TempSrc:  Oral  Oral  SpO2: 98%  96% 95%  Weight:      Height:       Supplemental O2: Room Air SpO2: 95 %   Physical Exam:  Constitutional: well-appearing female sitting in chair, NAD Cardiovascular: regular rate Pulmonary/Chest: normal work of breathing on room air Neurological: alert & oriented x 3, decreased sensation of bilateral LE, unable to fully assess LE strength due to pain Skin: warm and dry Psych: anxious mood  Filed Weights   03/18/22 0051 03/18/22 0944  Weight: 100 kg 100 kg     Intake/Output Summary (Last 24 hours) at 03/20/2022 0614 Last data filed at 03/19/2022 2251 Gross per 24 hour  Intake 414.17 ml  Output --  Net 414.17 ml   Net IO Since Admission: 414.17 mL [03/20/22 0614]  Pertinent Labs:    Latest Ref Rng & Units 03/18/2022    2:15 AM 03/15/2022   10:57 PM 03/14/2022     1:58 PM  CBC  WBC 4.0 - 10.5 K/uL 9.6  6.8  7.6   Hemoglobin 12.0 - 15.0 g/dL 13.5  12.5  13.9   Hematocrit 36.0 - 46.0 % 38.3  36.9  40.6   Platelets 150 - 400 K/uL 249  232  265        Latest Ref Rng & Units 03/19/2022    9:14 AM 03/18/2022    2:15 AM 03/15/2022   10:57 PM  CMP  Glucose 70 - 99 mg/dL 384  267  304   BUN 6 - 20 mg/dL 12  8  12   $ Creatinine 0.44 - 1.00 mg/dL 0.76  0.82  0.87   Sodium 135 - 145 mmol/L 129  133  137   Potassium 3.5 - 5.1 mmol/L 4.2  3.3  3.8   Chloride 98 - 111 mmol/L 99  99  105   CO2 22 - 32 mmol/L 20  23  23   $ Calcium 8.9 - 10.3 mg/dL 8.8  8.8  8.8   Total Protein 6.5 - 8.1 g/dL  6.5  6.2   Total Bilirubin 0.3 - 1.2 mg/dL  0.4  0.5   Alkaline Phos 38 - 126 U/L  91  67   AST 15 - 41 U/L  21  20   ALT 0 - 44 U/L  34  22     Imaging: No results found.  Assessment/Plan:   Principal Problem:   Acute lumbar back pain Active Problems:   Low back pain   Patient Summary: Melissa Gilmore is a 27 y.o. with MH of T2DM, dyslipidemia, depression who presents with worsening low back pain and admitted for acute on chronic lumbar pain with new neuro deficits.  Acute on chronic lumbar back pain L2-L3 disc degeneration  Spinal stenosis, R lateral recess stenosis Pain is better today with stable neuroexam.  Still has difficulty with ambulating with some lower extremity numbness. Neurosurgery recommends epidural steroid injection given uncontrolled pain, IR consulted and should perform procedure today.  -appreciate neurosurgery assistance -IR consult for epidural steroid injection -start lidocaine patch  -start gabapentin 300 mg at bedtime -tylenol 1000 mg q8h -toradol 30 mg q6 x 5 doses then transition to p.o. NSAIDs rotated with Tylenol as above -oxycodone 5 mg q4h PRN moderate -Stopping Dilaudid -on bowel regimen -HHPT  Syncope Has not had any syncopal episode since Saturday.  Events seem vasovagal in nature based on history and  clinical appearance.  She also endorsed some orthostatic dizziness today.  Could benefit from outpatient cardiac monitoring at discharge. - Follow-up with orthostatic vitals - Cardiac monitor at discharge  T2DM A1c 11.4%. On long acting insulin 30 units and daily Victoza. Was started on decadron at beginning of admission which caused hyperglycemia.  Decadron was discontinued yesterday.  CBGs have since been well-controlled -Semglee 30 -on SSI (resistant) with meals and at bedtime  Hypokalemia Hypomagnesemia  -replete electrolytes as needed  Diet: Carb-Modified IVF: None,None VTE: Enoxaparin Code: Full PT/OT recs: Pending Family Update:   Dispo: Anticipated discharge to Home pending pain control and glycemic control  Linus Galas MD Internal Medicine Resident PGY-1 Pager: [phone]  Please contact the on call pager after 5 pm and on weekends at [phone].

## 2022-03-20 NOTE — Progress Notes (Signed)
Chayse Dunman is a 27 y.o. female with PMH of DM II, HLD and schizophrenia. She presented to the ED of [facility] 03/14/22 with persistent and worsening back pain with bilateral numbness in her lower extremities and a near synocpal episode that was thought to be vasovagal in nature. MR Lumbar spine from 03/16/22 reads lumbar disc degeneration at L2-L3 and L3-L4. Moderate-sized caudal disc extrusion at the latter with moderate spinal and moderate to severe right lateral recess stenosis. Query Right L4 radiculitis. Decaron IV was remitted due to hyperglycemia. Glucose 385. Patient is on lovenox prophylactic dosage. Last dose given on 03/19/22. Request was received to perform L3-L4 epidural steroid injection for severe pain. Imaging was reviewed by Mir and approved for today. Procedure will be performed with local anesthetic. Dr. Dwaine Gale reviewed imaging and approved procedure.  Pt was seen at the bedside, procedure and risks were discussed.  Consent signed and in chart.   Narda Rutherford, AGNP-BC 03/20/2022, 9:36 AM

## 2022-03-20 NOTE — Inpatient Diabetes Management (Signed)
Inpatient Diabetes Program Recommendations  AACE/ADA: New Consensus Statement on Inpatient Glycemic Control (2015)  Target Ranges:  Prepandial:   less than 140 mg/dL      Peak postprandial:   less than 180 mg/dL (1-2 hours)      Critically ill patients:  140 - 180 mg/dL   Lab Results  Component Value Date   GLUCAP 272 (H) 03/20/2022   HGBA1C 11.4 (A) 03/16/2022    Review of Glycemic Control  Diabetes history: type 2 Outpatient Diabetes medications: Lantus 20 units at HS, Victoza 1.8 mg daily Current orders for Inpatient glycemic control: Semglee 30 units daily, Novolog 0-20 units correction scale TID, Novolog 0-5 units HS scale  Inpatient Diabetes Program Recommendations:   Spoke with patient at the bedside. States that she was diagnosed with diabetes at age 27. Has only been on insulin for about 3 years. States A1C of 11.4% is higher than it has been, but states that she is taking her medications every day. Checks blood sugars per patient. Has PCP. States that she has a home blood glucose meter and strips, but needs refills for   Will continue to follow while in the hospital.  Harvel Ricks RN BSN CDE Diabetes Coordinator Pager: [phone]  8am-5pm

## 2022-03-20 NOTE — Progress Notes (Signed)
Mobility Specialist - Progress Note   03/20/22 1544  Mobility  Activity Ambulated with assistance in hallway  Level of Assistance Standby assist, set-up cues, supervision of patient - no hands on  Assistive Device Front wheel walker  Distance Ambulated (ft) 500 ft  Activity Response Tolerated well  Mobility Referral Yes  $Mobility charge 1 Mobility   Pt was received in bed and agreeable to mobility despite c/o lower back pain. No faults throughout session. Pt was returned to bed with all needs met.   Franki Monte  Mobility Specialist Please contact via S[employer] or Rehab office at [phone]

## 2022-03-20 NOTE — Hospital Course (Signed)
2/12: States that her pain is still present. Medications help her move around but pain comes back as soon as medications wear off. IV pain meds help more than the oral tablets. She has not had any more episodes of losing consciousness but does have episodes of dizziness. Last syncopal episode this past Saturday. Only gets dizzy when she is standing up. Her R leg feels somewhat numb and she has to drag it when she moves.

## 2022-03-21 ENCOUNTER — Inpatient Hospital Stay (HOSPITAL_COMMUNITY): Payer: Medicaid Other

## 2022-03-21 ENCOUNTER — Other Ambulatory Visit (HOSPITAL_COMMUNITY): Payer: Self-pay

## 2022-03-21 ENCOUNTER — Other Ambulatory Visit: Payer: Self-pay | Admitting: Cardiology

## 2022-03-21 DIAGNOSIS — R55 Syncope and collapse: Secondary | ICD-10-CM

## 2022-03-21 HISTORY — PX: IR EPIDUROGRAPHY: IMG2365

## 2022-03-21 LAB — GLUCOSE, CAPILLARY
Glucose-Capillary: 164 mg/dL — ABNORMAL HIGH (ref 70–99)
Glucose-Capillary: 156 mg/dL — ABNORMAL HIGH (ref 70–99)
Glucose-Capillary: 130 mg/dL — ABNORMAL HIGH (ref 70–99)
Glucose-Capillary: 291 mg/dL — ABNORMAL HIGH (ref 70–99)

## 2022-03-21 LAB — BASIC METABOLIC PANEL
Anion gap: 10 (ref 5–15)
BUN: 9 mg/dL (ref 6–20)
CO2: 22 mmol/L (ref 22–32)
Calcium: 8.4 mg/dL — ABNORMAL LOW (ref 8.9–10.3)
Chloride: 103 mmol/L (ref 98–111)
Creatinine, Ser: 0.69 mg/dL (ref 0.44–1.00)
GFR, Estimated: >60 mL/min (ref >60)
Glucose, Bld: 191 mg/dL — ABNORMAL HIGH (ref 70–99)
Potassium: 3.9 mmol/L (ref 3.5–5.1)
Sodium: 135 mmol/L (ref 135–145)

## 2022-03-21 MED ORDER — IOHEXOL 180 MG/ML  SOLN
INTRAMUSCULAR | Status: AC
  Administered 2022-03-21: 5 mL
  Filled 2022-03-21: qty 10

## 2022-03-21 MED ORDER — METHYLPREDNISOLONE ACETATE 80 MG/ML IJ SUSP
INTRAMUSCULAR | Status: AC
  Administered 2022-03-21: 80 mg
  Filled 2022-03-21: qty 1

## 2022-03-21 MED ORDER — LIDOCAINE HCL (PF) 1 % IJ SOLN
INTRAMUSCULAR | Status: AC
  Administered 2022-03-21: 10 mL
  Filled 2022-03-21: qty 30

## 2022-03-21 MED ORDER — GABAPENTIN 300 MG PO CAPS
300.0000 mg | ORAL_CAPSULE | Freq: Every day | ORAL | 0 refills | Status: DC
  Filled 2022-03-21: qty 30, 30d supply, fill #0

## 2022-03-21 NOTE — Discharge Summary (Signed)
Name: Jareth Mammenga MRN: [id number] DOB: [birth year] 27 y.o. PCP: Dorna Mai, MD  Date of Admission: 03/18/2022 12:43 AM Date of Discharge: 03/21/2022 Attending Physician: Dr. Dareen Piano  Discharge Diagnosis: Principal Problem:   Acute lumbar back pain Active Problems:   Low back pain    Discharge Medications: Allergies as of 03/21/2022   No Known Allergies      Medication List     STOP taking these medications    predniSONE 50 MG tablet Commonly known as: DELTASONE       TAKE these medications    Accu-Chek Guide Me w/Device Kit Use to check blood sugar twice daily. Dx E11.65   Accu-Chek Guide test strip Generic drug: glucose blood Use to check blood sugar twice daily. Dx E11.65   Accu-Chek Softclix Lancets lancets Use to check blood sugar twice daily. Dx E11.65   B-D UF III MINI PEN NEEDLES 31G X 5 MM Misc Generic drug: Insulin Pen Needle Use as instructed. Inject into the skin once daily   cyclobenzaprine 10 MG tablet Commonly known as: FLEXERIL Take 1 tablet (10 mg total) by mouth 2 (two) times daily as needed for muscle spasms.   gabapentin 300 MG capsule Commonly known as: NEURONTIN Take 1 capsule (300 mg total) by mouth at bedtime.   Lantus SoloStar 100 UNIT/ML Solostar Pen Generic drug: insulin glargine Inject 20 Units into the skin daily. What changed: when to take this   lidocaine 5 % Commonly known as: Lidoderm Place 1 patch onto the skin every 12 (twelve) hours. Remove & Discard patch within 12 hours or as directed by MD   meclizine 25 MG tablet Commonly known as: ANTIVERT Take 1 tablet (25 mg total) by mouth 3 (three) times daily as needed for dizziness.   medroxyPROGESTERone 150 MG/ML injection Commonly known as: DEPO-PROVERA Inject 150 mg into the muscle every 3 (three) months.   naproxen 500 MG tablet Commonly known as: NAPROSYN Take 1 tablet (500 mg total) by mouth 2 (two) times daily.   Norgestimate-Ethinyl  Estradiol Triphasic 0.18/0.215/0.25 MG-35 MCG tablet Commonly known as: Tri-Sprintec Take 1 tablet by mouth daily.   ondansetron 4 MG tablet Commonly known as: Zofran Take 1 tablet (4 mg total) by mouth every 8 (eight) hours as needed for nausea or vomiting.   oxyCODONE 5 MG immediate release tablet Commonly known as: Roxicodone Take 1 tablet (5 mg total) by mouth every 4 (four) hours as needed for severe pain.   Victoza 18 MG/3ML Sopn Generic drug: liraglutide Inject 1.8 mg into the skin daily.        Disposition and follow-up:   Ms.Sakeenah Alynda Loren was discharged from [facility] in Stable condition.  At the hospital follow up visit please address:  1.  Follow-up:  a.  Please reassess back pain and pain regimen and adjust as appropriate.  She might need outpatient follow-up with spine specialist for further spinal epidural steroid injections in the future.  Please also make sure she has established with outpatient PT.    b.  Please assess for any further episodes of syncope.  Cardiac monitor should be delivered to her address, please make sure she has gotten this and started using it.   c.  Please adjust insulin regimen as needed  2.  Labs / imaging needed at time of follow-up: None 3.  Pending labs/ test needing follow-up: None  Follow-up Appointments:  Follow-up Information     Dorna Mai, MD. Schedule an  appointment as soon as possible for a visit.   Specialty: Family Medicine Why: Please call the clinic to schedule a hospital follow up in the next 7-10 days. Contact information: [place] [address] [phone]         [facility] Follow up.   Specialty: Rehabilitation Why: Please call the Outpatient clinic to follow upregarding outpatient physical therapy. Contact information: [address] [id number] mc [place]  [facility] Course by problem list: Elizabelle Tropeano is a 27 y.o. with MH of T2DM, dyslipidemia, depression who presents with worsening low back pain and admitted for acute on chronic lumbar pain with new neuro deficits.   Acute on chronic lumbar back pain L2-L3 disc degeneration  Spinal stenosis, R lateral recess stenosis Patient is presenting with worsening localized lumbar back pain along with new neurological deficits with decreased sensation below R knee and weakness. The pain has been so severe that this has limited her functional status to the point she is having difficulties walking. MRI on 2/8 revealed disc degeneration, disc extrusion, spinal stenosis, and right lateral recess stenosis.  She did initially receive a dose of Decadron in the ED which was since discontinued because of significant hyperglycemia.  Neurosurgery was consulted and recommended epidural steroid injection.  IR performed start injection on 2/13 with significant relief.  On subsequent exam, she has minimal tenderness to palpation and is able to move both of her legs and walk, and has improved sensation in bilateral lower extremities.  Given stability after her procedure, she is ready for discharge today with chronic pain regimen and outpatient PT.  She had already previously been prescribed a short course of Flexeril and oxycodone, which she can continue temporarily but would not continue opioids in the long run for chronic back pain.  She can follow-up with PCP and if needed, can be referred to outpatient spine specialist for further epidural steroid injections in the future.  Syncope Prior to admission, patient had multiple syncopal episodes since her pain has increased. These episodes sound vasovagal in nature, as she gets a "flushed" feeling while her pain is extreme. She does not feel lightheaded, dizzy, or get palpitations preceding these episodes. There is a prodrome  and it is not associated with exertion.  Orthostatics inpatient were negative.  She had not had any episodes for at least 4 days before day of discharge.  She will be discharged with cardiac monitor and appropriate follow-up, though suspect vasovagal syncope as the most likely etiology.   T2DM A1c 11.4%. On long acting insulin 20 units and daily Victoza. Was started on decadron at beginning of admission which caused hyperglycemia and was subsequently discontinued.  Her long-acting insulin was increased to 30 units daily and SSI (resistant) was added ACHS.  CBGs have since been well-controlled.  She will be discharged on prior insulin regimen and can be titrated outpatient as needed.   Hypokalemia Hypomagnesemia  Replete electrolytes as needed   Subjective: She feels significantly better after her steroid injection.  Her sensation has improved in her lower extremities.  She is able to walk without any issues.  She has reduced back pain as well.  She has not had any further episodes of syncope.  She is amenable to discharge later today with pain regimen and outpatient follow-up/PT.  Discharge Vitals:   BP 124/79 (BP  Location: Left Arm)   Pulse 66   Temp 98.3 F (36.8 C) (Oral)   Resp 18   Ht 5' 4.02" (1.626 m)   Wt 100 kg   SpO2 100%   BMI 37.82 kg/m  Discharge exam: Constitutional: well-appearing female sitting in chair, NAD Cardiovascular: RRR, no MRG Pulmonary/Chest: normal work of breathing on room air Neurological/MSK: alert & oriented x 3, intact sensation of bilateral LE, 5/5 LE strength.  No tenderness to palpation over back or spine. Skin: warm and dry Psych: anxious mood   Pertinent Labs, Studies, and Procedures:     Latest Ref Rng & Units 03/18/2022    2:15 AM 03/15/2022   10:57 PM 03/14/2022    1:58 PM  CBC  WBC 4.0 - 10.5 K/uL 9.6  6.8  7.6   Hemoglobin 12.0 - 15.0 g/dL 13.5  12.5  13.9   Hematocrit 36.0 - 46.0 % 38.3  36.9  40.6   Platelets 150 - 400 K/uL 249  232   265        Latest Ref Rng & Units 03/21/2022    2:48 AM 03/20/2022    5:59 AM 03/19/2022    9:14 AM  CMP  Glucose 70 - 99 mg/dL 191  174  384   BUN 6 - 20 mg/dL 9  12  12   $ Creatinine 0.44 - 1.00 mg/dL 0.69  0.77  0.76   Sodium 135 - 145 mmol/L 135  134  129   Potassium 3.5 - 5.1 mmol/L 3.9  3.4  4.2   Chloride 98 - 111 mmol/L 103  101  99   CO2 22 - 32 mmol/L 22  22  20   $ Calcium 8.9 - 10.3 mg/dL 8.4  8.5  8.8     No results found.   Discharge Instructions: Discharge Instructions     Ambulatory referral to Physical Therapy   Complete by: As directed    Iontophoresis - 4 mg/ml of dexamethasone: No   T.E.N.S. Unit Evaluation and Dispense as Indicated: No   Diet - low sodium heart healthy   Complete by: As directed    Discharge instructions   Complete by: As directed    If you have new weakness or numbness in your legs, passout again, are not able to walk again, please, to the ED to be reevaluated.  Otherwise, please follow-up with your primary doctor and they can consider referral to spine specialist if you need further steroid injections in your spine. 1.  You can take Tylenol extra strength every 6 hours and can also rotate naproxen 500 every 8 hours.  I would take Flexeril at night if needed for muscle spasms.  We also started gabapentin at night which can help with nerve pain.  If your pain is still unbearable, I would try the oxycodone which was recently prescribed to you, but oxycodone is not a good medicine to treat back pain in the long run. 2.  You should be set up with outpatient physical therapy which will do a lot to help your back pain   Increase activity slowly   Complete by: As directed        Discharge Instructions   None     Signed: Linus Galas, MD 03/21/2022, 5:17 PM   Pager: [phone]

## 2022-03-21 NOTE — Progress Notes (Signed)
Physical Therapy Treatment Patient Details Name: Melissa Gilmore MRN: [id number] DOB: 04/15/1995 Today's Date: 03/21/2022   History of Present Illness Pt is a 27 y/o F presenting to ED on 2/10 with low back pain, abdominal pain, and dizziness x3 days. Near syncopal episodes in ED. MRI of lumbar spine revealing disc degeneration at L2-3, L3-4. PMH includes depression, DM, dissociative identity disorder, and schizophrenia    PT Comments    Pt pleasant and reports decreased pain with sitting and activity this session. 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 Improved step through pattern with progressive gait with pt able to perform stairs and HEP. OPPT remains appropriate and encouraged continued mobility and utilization of back precautions for pain control.     Recommendations for follow up therapy are one component of a multi-disciplinary discharge planning process, led by the attending physician.  Recommendations may be updated based on patient status, additional functional criteria and insurance authorization.  Follow Up Recommendations  Outpatient PT     Assistance Recommended at Discharge PRN  Patient can return home with the following Assistance with cooking/housework;Assist for transportation   Equipment Recommendations  None recommended by PT    Recommendations for Other Services       Precautions / Restrictions Precautions Precautions: Fall     Mobility  Bed Mobility Overal bed mobility: Modified Independent Bed Mobility: Rolling, Sidelying to Sit           General bed mobility comments: cues for sequence with pt able to perform with bed flat    Transfers Overall transfer level: Needs assistance   Transfers: Sit to/from Stand Sit to Stand: Supervision           General transfer comment: pt reaching for counter for support to stand from bed and rail to rise from toilet    Ambulation/Gait Ambulation/Gait assistance: Supervision Gait Distance  (Feet): 550 Feet Assistive device: None Gait Pattern/deviations: Step-to pattern, Step-through pattern   Gait velocity interpretation: 1.31 - 2.62 ft/sec, indicative of limited community ambulator   General Gait Details: pt initially with decreased knee flexion and swing RLE with gait with step to pattern with progression to swing through during gait with conversation and cues without need for UE support   Stairs Stairs: Yes Stairs assistance: Supervision Stair Management: Step to pattern, Forwards Number of Stairs: 6 General stair comments: performed 6 steps for repetition with cues for sequence   Wheelchair Mobility    Modified Rankin (Stroke Patients Only)       Balance Overall balance assessment: Mild deficits observed, not formally tested                                          Cognition Arousal/Alertness: Awake/alert Behavior During Therapy: WFL for tasks assessed/performed Overall Cognitive Status: Within Functional Limits for tasks assessed                                          Exercises General Exercises - Lower Extremity Long Arc Quad: AROM, Both, Seated, 20 reps Hip Flexion/Marching: AROM, Both, Seated, 15 reps    General Comments        Pertinent Vitals/Pain Pain Assessment Pain Score: 4  Pain Location: low back Pain Descriptors / Indicators: Aching Pain Intervention(s): Limited activity within patient's tolerance,  Repositioned, Monitored during session, Premedicated before session    Home Living                          Prior Function            PT Goals (current goals can now be found in the care plan section) Progress towards PT goals: Progressing toward goals    Frequency    Min 2X/week      PT Plan Current plan remains appropriate;Frequency needs to be updated    Co-evaluation              AM-PAC PT "6 Clicks" Mobility   Outcome Measure  Help needed turning from your  back to your side while in a flat bed without using bedrails?: None Help needed moving from lying on your back to sitting on the side of a flat bed without using bedrails?: None Help needed moving to and from a bed to a chair (including a wheelchair)?: None Help needed standing up from a chair using your arms (e.g., wheelchair or bedside chair)?: A Little Help needed to walk in hospital room?: A Little Help needed climbing 3-5 steps with a railing? : A Little 6 Click Score: 21    End of Session   Activity Tolerance: Patient tolerated treatment well Patient left: in chair;with call bell/phone within reach;with family/visitor present Nurse Communication: Mobility status PT Visit Diagnosis: Other abnormalities of gait and mobility (R26.89);Pain     Time: [id number] PT Time Calculation (min) (ACUTE ONLY): 23 min  Charges:  $Gait Training: 23-37 mins                     Bayard Males, PT Acute Rehabilitation Services Office: [place] 03/21/2022, 12:47 PM

## 2022-03-21 NOTE — Progress Notes (Signed)
Mobility Specialist - Progress Note   03/21/22 1433  Mobility  Activity Ambulated with assistance in hallway  Level of Assistance Standby assist, set-up cues, supervision of patient - no hands on  Assistive Device None  Distance Ambulated (ft) 300 ft  Activity Response Tolerated well  Mobility Referral Yes  $Mobility charge 1 Mobility   Pt received in bed and agreeable to mobility. No complaints throughout session. Pt was returned to bed with all needs met.   Melissa Gilmore  Mobility Specialist Please contact via Solicitor or Rehab office at [phone]

## 2022-03-21 NOTE — TOC Initial Note (Signed)
Transition of Care [facility]) - Initial/Assessment Note    Patient Details  Name: Melissa Gilmore MRN: [id number] Date of Birth: [birth year]  [facility] CM/SW Contact:    Curlene Labrum, RN Phone Number: 03/21/2022, 9:41 AM  Clinical Narrative:                 CM met with the patient at the bedside to discuss TOC need for OUtpatient PT referral.  The patient lives with family at the home.  Her mother is able to provide transportation.  The patient uses a RW at the home.  Outpatient referral placed for OUtpatient PT at the [facility].  Follow up placed in the discharge instructions.  T  No other TOC needs.  Expected Discharge Plan: OP Rehab Barriers to Discharge: Continued Medical Work up   Patient Goals and CMS Choice Patient states their goals for this hospitalization and ongoing recovery are:: To get better and return home. CMS Medicare.gov Compare Post Acute Care list provided to:: Patient Choice offered to / list presented to : Patient [place] ownership interest in [facility].provided to:: Patient    Expected Discharge Plan and Services   Discharge Planning Services: CM Consult Post Acute Care Choice: Resumption of Svcs/PTA Provider Living arrangements for the past 2 months: Single Family Home                                      Prior Living Arrangements/Services Living arrangements for the past 2 months: Single Family Home Lives with:: Parents, Siblings, Significant Other (Lives with mom, sister and boyfriend) Patient language and need for interpreter reviewed:: Yes Do you feel safe going back to the place where you live?: Yes      Need for Family Participation in Patient Care: Yes (Comment) Care giver support system in place?: Yes (comment) Current home services: DME (uses walker at home) Criminal Activity/Legal Involvement Pertinent to Current Situation/Hospitalization: No - Comment as  needed  Activities of Daily Living Home Assistive Devices/Equipment: Walker (specify type) ADL Screening (condition at time of admission) Patient's cognitive ability adequate to safely complete daily activities?: Yes Is the patient deaf or have difficulty hearing?: No Does the patient have difficulty seeing, even when wearing glasses/contacts?: No Does the patient have difficulty concentrating, remembering, or making decisions?: No Patient able to express need for assistance with ADLs?: Yes Does the patient have difficulty dressing or bathing?: No Independently performs ADLs?: Yes (appropriate for developmental age) Does the patient have difficulty walking or climbing stairs?: Yes Weakness of Legs: Both Weakness of Arms/Hands: None  Permission Sought/Granted Permission sought to share information with : Case Manager, Family Supports, Chartered certified accountant granted to share information with : Yes, Verbal Permission Granted     Permission granted to share info w AGENCY: OP PT set up through [facility] OUtpatient clinic - referral placed.        Emotional Assessment Appearance:: Appears stated age Attitude/Demeanor/Rapport: Gracious Affect (typically observed): Accepting Orientation: : Oriented to Self, Oriented to Place, Oriented to  Time, Oriented to Situation Alcohol / Substance Use: Not Applicable Psych Involvement: No (comment)  Admission diagnosis:  Low back pain [M54.50] Progressive neurological deficit [R29.818] Acute midline low back pain without sciatica [M54.50] Acute left-sided low back pain with right-sided sciatica [M54.41] Patient Active Problem List   Diagnosis Date Noted   Low back pain 03/19/2022  Acute lumbar back pain 03/18/2022   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[place]) 07/17/2021   Schizophrenia ([place]) 07/17/2021   Depression 07/17/2021   Insulin-requiring or dependent type II diabetes mellitus ([place]) 07/17/2021   Body mass index (BMI)  35.0-35.9, adult 07/17/2021   Dyslipidemia 07/17/2021   History of abnormal uterine bleeding 07/17/2021   Acute cholecystitis with chronic cholecystitis 07/16/2021   PCP:  Dorna Mai, MD Pharmacy:   [id number] [id number] [id number]: [phone]: [phone]    Social Determinants of Health (SDOH) Social History: [place] Food Insecurity Present (03/18/2022)  Housing: Low Risk  (03/18/2022)  Transportation Needs: No Transportation Needs (03/18/2022)  Utilities: At Risk (03/18/2022)  Depression (PHQ2-9): Low Risk  (03/16/2022)  Financial Resource Strain: High Risk (03/16/2022)  Social Connections: Moderately Isolated (03/16/2022)  Stress: Stress Concern Present (03/16/2022)  Tobacco Use: Low Risk  (03/18/2022)   SDOH Interventions: Food Insecurity Interventions: Inpatient TOC Housing Interventions: Intervention Not Indicated Transportation Interventions: Intervention Not Indicated Utilities Interventions: Inpatient TOC   Readmission Risk Interventions    03/21/2022    9:41 AM  Readmission Risk Prevention Plan  Post Dischage Appt Complete  Medication Screening Complete  Transportation Screening Complete

## 2022-03-21 NOTE — Progress Notes (Signed)
\    Cardiology asked to place 30 day monitor for syncope. Dr. Stanford Breed to read

## 2022-03-22 ENCOUNTER — Telehealth: Payer: Self-pay

## 2022-03-22 NOTE — Telephone Encounter (Signed)
Transition Care Management Unsuccessful Follow-up Telephone Call  Date of discharge and from where:  03/21/2022, [facility]   Attempts:  1st Attempt  Reason for unsuccessful TCM follow-up call:  Left voice message [phone], call back requested.    Patient has an appointment with Dr Redmond Pulling at [facility] on 03/29/2022.

## 2022-03-23 ENCOUNTER — Telehealth: Payer: Self-pay

## 2022-03-23 DIAGNOSIS — R55 Syncope and collapse: Secondary | ICD-10-CM

## 2022-03-23 HISTORY — DX: Syncope and collapse: R55

## 2022-03-23 NOTE — Telephone Encounter (Signed)
Transition Care Management Follow-up Telephone Call Date of discharge and from where: 03/21/2022, [facility]  How have you been since you were released from the hospital? She said that her pain "calmed down." She still reports dizziness when standing up.  She said laying down is okay.  She also reported that she has frequent back pain.. She said she was taking a shower and she felt dizzy.  She said that the dizziness is the same as when she was in the hospital. She also noted that her legs become numb at times   She said that she reported her symptoms to the neurosurgeon yesterday but nothing was changed with her medication regime.  She was at the store at the time of this call.  Any questions or concerns? Yes- noted above  Items Reviewed: Did the pt receive and understand the discharge instructions provided? Yes  Medications obtained and verified? Yes - she said she has all of her medications and did not have any questions about the med regime.  She has a working glucometer but did not check her blood sugar this morning. Other? No  Any new allergies since your discharge? No  Dietary orders reviewed? No Do you have support at home? Yes - family   Home Care and Equipment/Supplies: Were home health services ordered? no If so, what is the name of the agency? N/a  Has the agency set up a time to come to the patient's home? not applicable Were any new equipment or medical supplies ordered?  No What is the name of the medical supply agency? N/a Were you able to get the supplies/equipment? not applicable Do you have any questions related to the use of the equipment or supplies? No  Functional Questionnaire: (I = Independent and D = Dependent) ADLs: ambulates with RW. She said she needs assistance with ALDs, bathing, dressing and even bending over. Her mother, sister and boyfriend assist as needed.    Follow up appointments reviewed:  PCP Hospital f/u appt confirmed? Yes  Scheduled to see  Dr Redmond Pulling- 03/29/2022.  She also has an appointment for a depo injection on 04/12/2022.   [facility] f/u appt confirmed? Yes  -she saw the neurosurgeon yesterday.  Are transportation arrangements needed? No  If their condition worsens, is the pt aware to call PCP or go to the Emergency Dept.? Yes Was the patient provided with contact information for the PCP's office or ED? Yes Was to pt encouraged to call back with questions or concerns? Yes

## 2022-03-23 NOTE — Assessment & Plan Note (Signed)
Patient is severely elevated glucose in the setting of steroid use.  Blood sugars in the ED above 500.  Patient is on Victoza 1.8 mg daily and Lantus 20 units daily.  A1c here is 11.4 which is up from 8. - Recommend continuing current regimen and following up with PCP early next week to make changes

## 2022-03-23 NOTE — Assessment & Plan Note (Addendum)
Patient presents for a one-time visit after an ED follow-up.  She has come to the ED few times in the past few days due to severe low back pain with lower extremity weakness and recurrent syncope.  For her back pain she has had chronic mild low back pain for years but about 2 weeks ago her pain got severely worse without any inciting event.  She noted radicular pain wrapping around to her anterior abdomen and numbness and weakness in both of her legs which was worse on the right.  On exam the straight leg raise on the right caused pain but not radicular pain down to her toes.  Straight leg raise on the left was normal.  She did have some decreased sensation on her lower extremities bilaterally that was worse on the right with near normal strength in bilateral lower extremities, mostly limited by pain.  She was referred to neurosurgery from the emergency department and we called them during her visit to get her appointment which was scheduled for next week, 03/22/2022 at 11:30 AM. - Recommend keeping follow-up with neurosurgery - Continue with prescribed Flexeril, naproxen, and oxycodone for the ED

## 2022-03-23 NOTE — Assessment & Plan Note (Addendum)
Patient comes in for an ED follow-up for acute low back pain.  She is in her clinic and does not plan to establish care for this but has a PCP who did not have appointments available.  She notes syncope that started a couple days ago with a prodrome of feeling like blood is leaving her head or blood is going down her throat.  She also notes persistent dizziness that is not changed by position of her head and does not go away when she sleeps.  She also had nausea and vomiting recently but has not had it for the past couple days.  On exam there was negative hints exam and cranial nerves II through X are grossly intact.  She has not passed out today.  We will try a short course of meclizine and she will follow-up with her PCP early next week.  Syncope seems primarily vasovagal in the setting of severe pain. - Meclizine 25 mg 3 times daily as needed

## 2022-03-23 NOTE — Progress Notes (Signed)
Internal Medicine Clinic Attending  I saw and evaluated the patient.  I personally confirmed the key portions of the history and exam documented by Dr. Nikki Dom and I reviewed pertinent patient test results.  The assessment, diagnosis, and plan were formulated together and I agree with the documentation in the resident's note.   Melissa Gilmore is not an established patient of [facility]. There was some confusion on how/why her appointment got scheduled with [place] as she follows with [facility] and does not wish to change clinics. Possible miscommunication however we saw her for acute issues today. During her visit, I was able to schedule regular f/u with her physician, Dr. Redmond Pulling, on Monday, 2/12. Additional evaluation and management per Dr. Zannie Cove note.

## 2022-03-25 ENCOUNTER — Ambulatory Visit: Payer: Medicaid Other | Attending: Cardiology

## 2022-03-25 DIAGNOSIS — R55 Syncope and collapse: Secondary | ICD-10-CM | POA: Diagnosis not present

## 2022-03-27 ENCOUNTER — Encounter (HOSPITAL_COMMUNITY): Payer: Self-pay

## 2022-03-27 ENCOUNTER — Ambulatory Visit (HOSPITAL_COMMUNITY)
Admission: RE | Admit: 2022-03-27 | Discharge: 2022-03-27 | Disposition: A | Discharge status: Home / Self Care | Payer: Medicaid Other | Source: Ambulatory Visit | Attending: Family Medicine | Admitting: Family Medicine

## 2022-03-27 VITALS — BP 153/96 | HR 106 | Temp 98.6°F | Resp 16

## 2022-03-27 DIAGNOSIS — M545 Low back pain, unspecified: Secondary | ICD-10-CM

## 2022-03-27 MED ORDER — NAPROXEN 500 MG PO TABS: 500.0000 mg | ORAL_TABLET | Freq: Two times a day (BID) | ORAL | 0 refills | Status: DC | PRN

## 2022-03-27 NOTE — ED Triage Notes (Signed)
Pt is here for back pain and needs refill on pain meds . Pt states she has 1 pill.

## 2022-03-27 NOTE — Discharge Instructions (Signed)
Take naproxen 500 mg--1 tablet 2 times daily as needed for pain.   Keep your follow-up appointments with your primary care

## 2022-03-27 NOTE — ED Provider Notes (Signed)
[facility]    CSN: [id number] Arrival date & time: 03/27/22  1410      History   Chief Complaint Chief Complaint  Patient presents with   Medication Refill    Entered by patient    HPI Melissa Gilmore is a 27 y.o. female.    Medication Refill  Here for low back pain. She has been seen several times recently and she was admitted to the hospital also for low back pain.  She is now on gabapentin but is still felt nerve pain to take the oxycodone as needed.  She never picked up the naproxen that was prescribed on February 7.  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[place]) 07/17/2021   Schizophrenia ([place]) 07/17/2021   Depression 07/17/2021   Insulin-requiring or dependent type II diabetes mellitus ([place]) 07/17/2021   Body mass index (BMI) 35.0-35.9, adult 07/17/2021   Dyslipidemia 07/17/2021   History of abnormal uterine bleeding 07/17/2021   Acute cholecystitis with chronic cholecystitis 07/16/2021    Past Surgical History:  Procedure Laterality Date   CHOLECYSTECTOMY N/A 07/17/2021   Procedure: SINGLE SITE LAPAROSCOPIC CHOLECYSTECTOMY WITH INTRAOPERATIVE CHOLANGIOGRAM; LIVER BIOPSY;  Surgeon: Michael Boston, MD;  Location: [facility];  Service: General;  Laterality: N/A;   IR EPIDUROGRAPHY  03/21/2022   NO PAST SURGERIES      OB History   No obstetric history on file.      Home Medications    Prior to Admission medications   Medication Sig Start Date End Date Taking? Authorizing Provider  Accu-Chek Softclix Lancets lancets Use to check blood sugar twice  daily. Dx E11.65 08/06/20  Yes Charlott Rakes, MD  Blood Glucose Monitoring Suppl (ACCU-CHEK GUIDE ME) w/Device KIT Use to check blood sugar twice daily. Dx E11.65 08/06/20  Yes Charlott Rakes, MD  cyclobenzaprine (FLEXERIL) 10 MG tablet Take 1 tablet (10 mg total) by mouth 2 (two) times daily as needed for muscle spasms. 03/14/22  Yes Linus Galas, MD  gabapentin (NEURONTIN) 300 MG capsule Take 1 capsule (300 mg total) by mouth at bedtime. 03/21/22  Yes Linus Galas, MD  glucose blood (ACCU-CHEK GUIDE) test strip Use to check blood sugar twice daily. Dx E11.65 08/06/20  Yes Charlott Rakes, MD  insulin glargine (LANTUS SOLOSTAR) 100 UNIT/ML Solostar Pen Inject 20 Units into the skin daily. Patient taking differently: Inject 20 Units into the skin at bedtime. 11/08/20 07/16/22 Yes Gildardo Pounds, NP  Insulin Pen Needle (B-D UF III MINI PEN NEEDLES) 31G X 5 MM MISC Use as instructed. Inject into the skin once daily 11/08/20  Yes Gildardo Pounds, NP  liraglutide (VICTOZA) 18 MG/3ML SOPN Inject 1.8 mg into the skin daily. 11/08/20  Yes Gildardo Pounds, NP  meclizine (ANTIVERT) 25 MG tablet Take 1 tablet (25 mg total) by mouth 3 (three) times daily as needed for dizziness. 03/16/22  Yes [name], DO  naproxen (NAPROSYN) 500 MG tablet Take 1 tablet (500 mg total) by mouth 2 (two) times daily as needed (pain). 03/27/22  Yes Rex Magee, Gwenlyn Perking,  MD  ondansetron (ZOFRAN) 4 MG tablet Take 1 tablet (4 mg total) by mouth every 8 (eight) hours as needed for nausea or vomiting. 03/14/22 03/14/23 Yes Linus Galas, MD  medroxyPROGESTERone (DEPO-PROVERA) 150 MG/ML injection Inject 150 mg into the muscle every 3 (three) months.    [provider]    Family History Family History  Problem Relation Age of Onset   Diabetes Maternal Grandmother    Stomach cancer Maternal Grandmother     Social History Social History   Tobacco Use   Smoking status: Never   Smokeless tobacco: Never   Substance Use Topics   Alcohol use: Not Currently   Drug use: Not Currently     Allergies   Patient has no known allergies.   Review of Systems Review of Systems   Physical Exam Triage Vital Signs ED Triage Vitals  Enc Vitals Group     BP 03/27/22 1426 (!) 153/96     Pulse Rate 03/27/22 1426 (!) 106     Resp 03/27/22 1426 16     Temp 03/27/22 1426 98.6 F (37 C)     Temp Source 03/27/22 1426 Oral     SpO2 03/27/22 1426 97 %     Weight --      Height --      Head Circumference --      Peak Flow --      Pain Score 03/27/22 1436 10     Pain Loc --      Pain Edu? --      Excl. in [place]? --    No data found.  Updated Vital Signs BP (!) 153/96 (BP Location: Left Arm)   Pulse (!) 106   Temp 98.6 F (37 C) (Oral)   Resp 16   LMP 11/22/2021   SpO2 97%   Visual Acuity Right Eye Distance:   Left Eye Distance:   Bilateral Distance:    Right Eye Near:   Left Eye Near:    Bilateral Near:     Physical Exam Vitals reviewed.  Constitutional:      General: She is not in acute distress.    Appearance: She is not toxic-appearing.     Comments: Is without pained facies and is sitting with her right foot up underneath her left leg on the exam table  Musculoskeletal:        General: No tenderness.  Skin:    Coloration: Skin is not pale.  Neurological:     General: No focal deficit present.     Mental Status: She is alert and oriented to person, place, and time.     Cranial Nerves: No cranial nerve deficit.  Psychiatric:        Behavior: Behavior normal.      UC Treatments / Results  Labs (all labs ordered are listed, but only abnormal results are displayed) Labs Reviewed - No data to display  EKG   Radiology No results found.  Procedures Procedures (including critical care time)  Medications Ordered in UC Medications - No data to display  Initial Impression / Assessment and Plan / UC Course  I have reviewed the triage vital signs and the nursing  notes.  Pertinent labs & imaging results that were available during my care of the patient were reviewed by me and considered in my medical decision making (see chart for details).        I declined to do a new prescription of narcotics here in the urgent care.  She was offer of a new prescription for naproxen to try.  She will see primary care on the 21st of this month, and 2 days Final Clinical Impressions(s) / UC Diagnoses   Final diagnoses:  Low back pain, unspecified back pain laterality, unspecified chronicity, unspecified whether sciatica present     Discharge Instructions      Take naproxen 500 mg--1 tablet 2 times daily as needed for pain.   Keep your follow-up appointments with your primary care     ED Prescriptions     Medication Sig Dispense Auth. Provider   naproxen (NAPROSYN) 500 MG tablet Take 1 tablet (500 mg total) by mouth 2 (two) times daily as needed (pain). 30 tablet Jamyra Zweig, Gwenlyn Perking, MD      I have reviewed the PDMP during this encounter.   Barrett Henle, MD 03/27/22 [phone]

## 2022-03-29 ENCOUNTER — Encounter: Payer: Self-pay | Admitting: Family Medicine

## 2022-03-29 ENCOUNTER — Ambulatory Visit (INDEPENDENT_AMBULATORY_CARE_PROVIDER_SITE_OTHER): Payer: Medicaid Other | Admitting: Family Medicine

## 2022-03-29 VITALS — BP 126/88 | HR 97 | Temp 98.1°F | Resp 16 | Wt 228.6 lb

## 2022-03-29 DIAGNOSIS — Z3202 Encounter for pregnancy test, result negative: Secondary | ICD-10-CM | POA: Diagnosis not present

## 2022-03-29 DIAGNOSIS — M5441 Lumbago with sciatica, right side: Secondary | ICD-10-CM | POA: Diagnosis not present

## 2022-03-29 DIAGNOSIS — Z3042 Encounter for surveillance of injectable contraceptive: Secondary | ICD-10-CM | POA: Diagnosis not present

## 2022-03-29 DIAGNOSIS — M5442 Lumbago with sciatica, left side: Secondary | ICD-10-CM | POA: Diagnosis not present

## 2022-03-29 DIAGNOSIS — R55 Syncope and collapse: Secondary | ICD-10-CM | POA: Diagnosis not present

## 2022-03-29 DIAGNOSIS — E119 Type 2 diabetes mellitus without complications: Secondary | ICD-10-CM

## 2022-03-29 DIAGNOSIS — Z794 Long term (current) use of insulin: Secondary | ICD-10-CM

## 2022-03-29 DIAGNOSIS — G8929 Other chronic pain: Secondary | ICD-10-CM

## 2022-03-29 LAB — POCT URINE PREGNANCY: Preg Test, Ur: NEGATIVE

## 2022-03-29 MED ORDER — MEDROXYPROGESTERONE ACETATE 150 MG/ML IM SUSP
150.0000 mg | Freq: Once | INTRAMUSCULAR | Status: AC
  Administered 2022-03-29: 150 mg via INTRAMUSCULAR

## 2022-03-29 NOTE — Progress Notes (Unsigned)
Patient here for Depo-Provera injection. Still in her window. No c/o side effects. Shot given in R upper outer quadrant, tolerated ok. Next shot due between   May 9-May 23

## 2022-03-29 NOTE — Progress Notes (Unsigned)
New Patient Office Visit  Subjective    Patient ID: Melissa Gilmore, female    DOB: 11-Oct-1995  Age: 27 y.o. MRN: [id number]  CC: No chief complaint on file.   HPI Melissa Gilmore presents to establish care ***  Outpatient Encounter Medications as of 03/29/2022  Medication Sig   Accu-Chek Softclix Lancets lancets Use to check blood sugar twice daily. Dx E11.65   Blood Glucose Monitoring Suppl (ACCU-CHEK GUIDE ME) w/Device KIT Use to check blood sugar twice daily. Dx E11.65   cyclobenzaprine (FLEXERIL) 10 MG tablet Take 1 tablet (10 mg total) by mouth 2 (two) times daily as needed for muscle spasms.   gabapentin (NEURONTIN) 300 MG capsule Take 1 capsule (300 mg total) by mouth at bedtime.   glucose blood (ACCU-CHEK GUIDE) test strip Use to check blood sugar twice daily. Dx E11.65   insulin glargine (LANTUS SOLOSTAR) 100 UNIT/ML Solostar Pen Inject 20 Units into the skin daily. (Patient taking differently: Inject 20 Units into the skin at bedtime.)   Insulin Pen Needle (B-D UF III MINI PEN NEEDLES) 31G X 5 MM MISC Use as instructed. Inject into the skin once daily   liraglutide (VICTOZA) 18 MG/3ML SOPN Inject 1.8 mg into the skin daily.   meclizine (ANTIVERT) 25 MG tablet Take 1 tablet (25 mg total) by mouth 3 (three) times daily as needed for dizziness.   medroxyPROGESTERone (DEPO-PROVERA) 150 MG/ML injection Inject 150 mg into the muscle every 3 (three) months.   naproxen (NAPROSYN) 500 MG tablet Take 1 tablet (500 mg total) by mouth 2 (two) times daily as needed (pain).   ondansetron (ZOFRAN) 4 MG tablet Take 1 tablet (4 mg total) by mouth every 8 (eight) hours as needed for nausea or vomiting.   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[place])     Schizophrenia ([place])     Past Surgical History:  Procedure Laterality Date   CHOLECYSTECTOMY N/A 07/17/2021   Procedure: SINGLE SITE LAPAROSCOPIC CHOLECYSTECTOMY WITH INTRAOPERATIVE CHOLANGIOGRAM; LIVER BIOPSY;  Surgeon: Michael Boston, MD;  Location: [facility];  Service: General;  Laterality: N/A;   IR EPIDUROGRAPHY  03/21/2022   NO PAST SURGERIES      Family History  Problem Relation Age of Onset   Diabetes Maternal Grandmother    Stomach cancer Maternal Grandmother     Social History   Socioeconomic History   Marital status: Single    Spouse name: Not on file   Number of children: Not on file   Years of education: Not on file   Highest education level: Not on file  Occupational History   Not on file  Tobacco Use   Smoking status: Never   Smokeless tobacco: Never  Substance and Sexual Activity   Alcohol use: Not Currently   Drug use: Not Currently   Sexual activity: Not Currently  Other Topics Concern   Not on file  Social History Narrative   Not on file   Social Determinants of Health   Financial Resource Strain: High Risk (03/16/2022)   Overall Financial Resource Strain (CARDIA)    Difficulty of Paying Living Expenses: Hard  Food Insecurity: Food Insecurity Present (03/18/2022)   Hunger Vital Sign    Worried About Running Out of Food in the Last Year: Often true    Ran Out  of Food in the Last Year: Often true  Transportation Needs: No Transportation Needs (03/18/2022)   PRAPARE - Hydrologist (Medical): No    Lack of Transportation (Non-Medical): No  Physical Activity: Not on file  Stress: Stress Concern Present (03/16/2022)   [place]    Feeling of Stress : To some extent  Social Connections: Moderately Isolated (03/16/2022)   Social Connection and Isolation Panel [NHANES]    Frequency of Communication with Friends and Family: More than three times a week    Frequency of  Social Gatherings with Friends and Family: More than three times a week    Attends Religious Services: Never    Marine scientist or Organizations: No    Attends Archivist Meetings: Never    Marital Status: Living with partner  Intimate Partner Violence: Not At Risk (03/18/2022)   Humiliation, Afraid, Rape, and Kick questionnaire    Fear of Current or Ex-Partner: No    Emotionally Abused: No    Physically Abused: No    Sexually Abused: No    ROS      Objective    BP 126/88   Pulse 97   Temp 98.1 F (36.7 C) (Oral)   Resp 16   Wt 228 lb 9.6 oz (103.7 kg)   LMP 11/22/2021   SpO2 98%   BMI 39.22 kg/m   Physical Exam  {Labs (Optional):23779}    Assessment & Plan:   Problem List Items Addressed This Visit       Endocrine   Insulin-requiring or dependent type II diabetes mellitus ([place])     Other   Low back pain   Syncope   Other Visit Diagnoses     Depo-Provera contraceptive status    -  Primary   Relevant Medications   medroxyPROGESTERone (DEPO-PROVERA) injection 150 mg   Other Relevant Orders   POCT urine pregnancy       No follow-ups on file.   Becky Sax, MD

## 2022-03-30 ENCOUNTER — Encounter: Payer: Self-pay | Admitting: Family Medicine

## 2022-04-04 NOTE — Therapy (Signed)
OUTPATIENT PHYSICAL THERAPY THORACOLUMBAR EVALUATION   Patient Name: Melissa Gilmore MRN: [id number] DOB:27-Mar-1995, 27 y.o., female Today's Date: 04/06/2022  END OF SESSION:  PT End of Session - 04/06/22 0918     Visit Number 1    Number of Visits 13    Date for PT Re-Evaluation 05/26/22    Authorization Type MEDICAID [place] ACCESS    Authorization - Visit Number 3    Authorization - Number of Visits 0    PT Start Time [phone]    PT Stop Time 0937    PT Time Calculation (min) 45 min    Activity Tolerance Patient limited by pain    Behavior During Therapy [facility] for tasks assessed/performed             Past Medical History:  Diagnosis Date   Depression    Diabetes mellitus without complication ([place])    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[place])    Schizophrenia ([place])    Past Surgical History:  Procedure Laterality Date   CHOLECYSTECTOMY N/A 07/17/2021   Procedure: SINGLE SITE LAPAROSCOPIC CHOLECYSTECTOMY WITH INTRAOPERATIVE CHOLANGIOGRAM; LIVER BIOPSY;  Surgeon: Michael Boston, MD;  Location: [facility];  Service: General;  Laterality: N/A;   IR EPIDUROGRAPHY  03/21/2022   NO PAST SURGERIES     Patient Active Problem List   Diagnosis Date Noted   Syncope 03/23/2022   Low back pain 03/19/2022   Acute lumbar back pain 03/18/2022   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[place]) 07/17/2021   Schizophrenia ([place]) 07/17/2021   Depression 07/17/2021   Insulin-requiring or dependent type II diabetes mellitus ([place]) 07/17/2021   Body mass index (BMI) 35.0-35.9, adult 07/17/2021   Dyslipidemia 07/17/2021   History of abnormal uterine bleeding 07/17/2021   Acute cholecystitis with chronic cholecystitis 07/16/2021    PCP: Dorna Mai, MD  REFERRING PROVIDER: Aldine Contes, MD  REFERRING DIAG: (ICD-10-CM) - Acute midline low back pain without sciatica  Rationale for Evaluation and Treatment: Rehabilitation  THERAPY DIAG:  Acute midline low back pain with right-sided  sciatica  ONSET DATE: Low back pain since 27 yo. Progressively has become worse over the past 4 weeks.  SUBJECTIVE:                                                                                                                                                                                           SUBJECTIVE STATEMENT: Chronic pain which progressively became worse over the past 4 weeks. Pt also reports intermittent leg N/T, R>L, and dizziness which started with the increase in low back pain. Sometimes needs help with standing up from sitting. Pt reports she has seen a neurosurgeon and has  a return appt in approx 4 weeks.  PERTINENT HISTORY:  High BMI; Wearing a heart monitor related to the dizziness.  PAIN:  Are you having pain? Yes: NPRS scale: 7/10 Pain location: Midline low back Pain description: sharp Aggravating factors: prolonged standing, walking and sitting Relieving factors: Sometimes lying down, medication Pain range: 4-10/10   PRECAUTIONS: None  WEIGHT BEARING RESTRICTIONS: No  FALLS:  Has patient fallen in last 6 months? No  LIVING ENVIRONMENT: Lives with: lives with their family Lives in: House/apartment Able to access and be mobile within home  OCCUPATION: not asked  PLOF: Independent  PATIENT GOALS: Less pain and improved function  NEXT MD VISIT: approx 4 months  OBJECTIVE:   DIAGNOSTIC FINDINGS:   MRI lumbar 03/16/22 IMPRESSION: 1. Lumbar disc degeneration at L2-L3 and L3-L4. Moderate-sized caudal disc extrusion at the latter with moderate spinal and moderate to severe right lateral recess stenosis. Query Right L4 radiculitis.   2. Grade 1 anterolisthesis at L5-S1 with no associated pars defects or stenosis.  PATIENT SURVEYS:  LEFS 68% severe disability  SCREENING FOR RED FLAGS: Bowel or bladder incontinence: No Spinal tumors: No Cauda equina syndrome: No Compression fracture: No Abdominal aneurysm: No  COGNITION: Overall cognitive  status: Within functional limits for tasks assessed     SENSATION: WFL  MUSCLE LENGTH: Hamstrings: Right Decreased due to pain deg; Left WNL deg Marcello Moores test: Right NT deg; Left NT deg  POSTURE: rounded shoulders, forward head, and anterior pelvic tilt  PALPATION: TTP to the midline lumboscaral area  LUMBAR ROM:   AROM eval  Flexion Full, min lordosis remains, sharp pain with returning  Extension Min limitation, sharp pain  Right lateral flexion Mod limitation, sharp pain  Left lateral flexion Mod limitation, sharp pain  Right rotation Mod limitation, sharp pain  Left rotation Mod limitation, sharp pain   (Blank rows = not tested)  LOWER EXTREMITY ROM:    Grossly WNLs Active  Right eval Left eval  Hip flexion    Hip extension    Hip abduction    Hip adduction    Hip internal rotation    Hip external rotation    Knee flexion    Knee extension    Ankle dorsiflexion    Ankle plantarflexion    Ankle inversion    Ankle eversion     (Blank rows = not tested)  LOWER EXTREMITY MMT:   Myotome screen negative. Deceased hip strength related to pain. Core weakness. MMT Right eval Left eval  Hip flexion    Hip extension    Hip abduction    Hip adduction    Hip internal rotation    Hip external rotation    Knee flexion    Knee extension    Ankle dorsiflexion    Ankle plantarflexion    Ankle inversion    Ankle eversion     (Blank rows = not tested)  LUMBAR SPECIAL TESTS:  Straight leg raise test: Positive, Slump test: Positive, and SI Compression/distraction test: Negative  FUNCTIONAL TESTS:  5 times sit to stand: TBA , labored STS  GAIT: Distance walked: 200' Assistive device utilized: None Level of assistance: Complete Independence Comments: Decreased pace  TODAY'S TREATMENT:  [facility] Adult PT Treatment:                                                 DATE: 04/05/22 Therapeutic Exercise: NA- prone lying, SKTC, PPTs, and bridging were attempted, but all increased pt's pain Self care: Positioning in SL for pain relief  PATIENT EDUCATION:  Education details: Eval findings, POC Person educated: Patient Education method: Explanation Education comprehension: verbalized understanding  HOME EXERCISE PROGRAM: Not provided at this time  ASSESSMENT:  CLINICAL IMPRESSION: Patient is a 27 y.o. female who was seen today for physical therapy evaluation and treatment for M54.50 (ICD-10-CM) - Acute midline low back pain without sciatica. Pt presents with a chronic Hx of low back back which has increased and not improved over the last 4 weeks. With the PT assessment all trunk movements provoked the pt's pain, R SLR and slump test were positive, and pain was provoked with directional treatment preference assessment for both flexion and extension. Pt notes intermittent LE N/T, R>L.  OBJECTIVE IMPAIRMENTS: decreased activity tolerance, difficulty walking, decreased ROM, decreased strength, postural dysfunction, obesity, and pain.   ACTIVITY LIMITATIONS: carrying, lifting, bending, sitting, standing, squatting, sleeping, stairs, bed mobility, dressing, and locomotion level  PARTICIPATION LIMITATIONS: meal prep, cleaning, laundry, and community activity  PERSONAL FACTORS: Fitness, Past/current experiences, Time since onset of injury/illness/exacerbation, and 1 comorbidity: high BMI  are also affecting patient's functional outcome.   REHAB POTENTIAL: Good  CLINICAL DECISION MAKING: Evolving/moderate complexity  EVALUATION COMPLEXITY: Moderate   GOALS:  SHORT TERM GOALS: Target date: 04/28/22  Pt will be Ind in an initial HEP  Baseline: To be initiated Goal status: INITIAL  2.  Pt will voice understanding of measures to assist in pain reduction  Baseline: To be provided Goal status: INITIAL  3.  Pt will report a decreased in  LBP to a range of 2-6/10 for improved QOL Baseline: 4-10/10 Goal status: INITIAL  LONG TERM GOALS: Target date: 05/26/22  Pt will be Ind in a final HEP to maintain achieved LOF Baseline: To be initiated Goal status: INITIAL  2.  Improve trunk ROM to min limitations or less as indication of decreased pain and improved function. Baseline: see flow sheets Goal status: INITIAL  3.  Pt will report a decrease in LBP to a range of 0-5/10 for improved function and QOL Baseline: 4-10/10 Goal status: INITIAL  4.  Pt will demonstrate a bridge for 60" and a plank on knees for 30" as indication of improved core strength Baseline: NT Goal status: INITIAL  5.  Pt's Mod Oswestry score will improved to 30% or less as indication of improved function  Baseline: 68% severe disability  Goal status: INITIAL  6.  Improve 5xSTS by MCID of 5" as indication of improved functional mobility  Baseline: TBA Goal status: INITIAL  PLAN:  PT FREQUENCY: 2x/week  PT DURATION: 6 weeks  PLANNED INTERVENTIONS: Therapeutic exercises, Therapeutic activity, Gait training, Patient/Family education, Self Care, Joint mobilization, Stair training, Dry Needling, Electrical stimulation, Spinal mobilization, Cryotherapy, Moist heat, Taping, Traction, Ultrasound, Ionotophoresis '4mg'$ /ml Dexamethasone, Manual therapy, and Re-evaluation.  PLAN FOR NEXT SESSION: Review mod Oswestry; initiate HEP; progress therex as indicated; use of modalities, manual therapy; and TPDN as indicated.  Ahlayah Tarkowski MS, PT 04/06/22 10:11 AM

## 2022-04-05 ENCOUNTER — Other Ambulatory Visit: Payer: Self-pay

## 2022-04-05 ENCOUNTER — Ambulatory Visit: Payer: Medicaid Other | Attending: Internal Medicine

## 2022-04-05 DIAGNOSIS — M6281 Muscle weakness (generalized): Secondary | ICD-10-CM

## 2022-04-05 DIAGNOSIS — M5441 Lumbago with sciatica, right side: Secondary | ICD-10-CM | POA: Insufficient documentation

## 2022-04-05 DIAGNOSIS — M545 Low back pain, unspecified: Secondary | ICD-10-CM | POA: Diagnosis not present

## 2022-04-05 DIAGNOSIS — R208 Other disturbances of skin sensation: Secondary | ICD-10-CM | POA: Diagnosis present

## 2022-04-11 ENCOUNTER — Ambulatory Visit: Payer: Medicaid Other | Attending: Internal Medicine | Admitting: Physical Therapy

## 2022-04-11 ENCOUNTER — Encounter: Payer: Self-pay | Admitting: Physical Therapy

## 2022-04-11 DIAGNOSIS — R208 Other disturbances of skin sensation: Secondary | ICD-10-CM | POA: Insufficient documentation

## 2022-04-11 DIAGNOSIS — M6281 Muscle weakness (generalized): Secondary | ICD-10-CM | POA: Diagnosis present

## 2022-04-11 DIAGNOSIS — M5441 Lumbago with sciatica, right side: Secondary | ICD-10-CM | POA: Diagnosis present

## 2022-04-11 NOTE — Therapy (Signed)
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[place])    Schizophrenia ([place])    Past Surgical History:  Procedure Laterality Date   CHOLECYSTECTOMY N/A 07/17/2021   Procedure: SINGLE SITE LAPAROSCOPIC CHOLECYSTECTOMY WITH INTRAOPERATIVE CHOLANGIOGRAM; LIVER BIOPSY;  Surgeon: Michael Boston, MD;  Location: [facility];  Service: General;  Laterality: N/A;   IR EPIDUROGRAPHY  03/21/2022   NO PAST SURGERIES     Patient Active Problem List   Diagnosis Date Noted   Syncope 03/23/2022   Low back pain 03/19/2022   Acute lumbar back pain 03/18/2022   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[place]) 07/17/2021   Schizophrenia ([place]) 07/17/2021   Depression 07/17/2021   Insulin-requiring or dependent type II diabetes mellitus ([place]) 07/17/2021   Body mass index (BMI) 35.0-35.9, adult 07/17/2021   Dyslipidemia 07/17/2021   History of abnormal uterine bleeding 07/17/2021   Acute cholecystitis with chronic cholecystitis 07/16/2021    REFERRING DIAG: (ICD-10-CM) - Acute midline low back pain without sciatica  THERAPY DIAG:  Acute midline low back pain with right-sided sciatica  Muscle weakness (generalized)  Other disturbances of skin sensation  Rationale for Evaluation and Treatment Rehabilitation  PERTINENT HISTORY:  High BMI; Wearing a heart monitor related to the dizziness.  PRECAUTIONS: None   WEIGHT  BEARING RESTRICTIONS: No  SUBJECTIVE:                                                                                                                                                                                      SUBJECTIVE STATEMENT:  I can only get comfortable on my side. I try to stretch forward and backward but nothing helps.    PAIN:  Are you having pain? Yes: NPRS scale: 7/10 Pain location: Midline low back Pain description: sharp Aggravating factors: prolonged standing, walking and sitting Relieving factors: Sometimes lying down, medication Pain range: 4-10/10   OBJECTIVE: (objective measures completed at initial evaluation unless otherwise dated)  DIAGNOSTIC FINDINGS:   MRI lumbar 03/16/22 IMPRESSION: 1. Lumbar disc degeneration at L2-L3 and L3-L4. Moderate-sized caudal disc extrusion at the latter with moderate spinal and moderate to severe right lateral recess stenosis. Query Right L4 radiculitis.   2. Grade 1 anterolisthesis at L5-S1 with no associated pars defects or stenosis.   PATIENT SURVEYS:  LEFS 68% severe disability   SCREENING FOR RED FLAGS: Bowel or bladder incontinence: No Spinal tumors: No Cauda equina syndrome: No Compression fracture: No Abdominal aneurysm: No   COGNITION: Overall cognitive status: Within functional limits for tasks assessed                          SENSATION: WFL   MUSCLE LENGTH: Hamstrings: Right Decreased due to pain deg; Left WNL deg Marcello Moores test: Right NT deg; Left NT deg   POSTURE: rounded shoulders, forward head, and anterior pelvic tilt   PALPATION: TTP to the midline lumboscaral area   LUMBAR ROM:    AROM eval  Flexion Full, min lordosis remains, sharp pain with returning  Extension Min limitation, sharp pain  Right lateral flexion Mod limitation, sharp pain  Left lateral flexion Mod limitation, sharp pain  Right rotation Mod limitation, sharp pain  Left rotation Mod limitation, sharp pain   (Blank  rows = not tested)   LOWER EXTREMITY ROM:    Grossly WNLs Active  Right eval Left eval  Hip flexion      Hip extension      Hip abduction      Hip adduction      Hip internal rotation      Hip external rotation      Knee flexion      Knee extension      Ankle dorsiflexion      Ankle plantarflexion      Ankle inversion      Ankle eversion       (Blank rows = not tested)   LOWER EXTREMITY MMT:   Myotome screen negative. Deceased hip strength related to pain. Core weakness. MMT Right eval Left eval  Hip flexion      Hip extension      Hip abduction      Hip adduction      Hip internal rotation      Hip external rotation      Knee flexion      Knee extension      Ankle dorsiflexion      Ankle plantarflexion      Ankle inversion      Ankle eversion       (Blank rows = not tested)   LUMBAR SPECIAL TESTS:  Straight leg raise test: Positive, Slump test: Positive, and SI Compression/distraction test: Negative   FUNCTIONAL TESTS:  5 times sit to stand: TBA , labored STS   GAIT: Distance walked: 200' Assistive device utilized: None Level of assistance: Complete Independence Comments: Decreased pace   TODAY'S TREATMENT:  [facility] Adult PT Treatment:                                                DATE: 04/11/22 Therapeutic Exercise: SKTC PPT LTR S/L Clam x 10, reverse clam x 10  Open books, modified hand on chest x 5 each   Modalities: Estim: IFC to tolerance  23m,  with HMP x  15 minutes                                                                                                                                  [facility] Adult PT Treatment:                                                DATE: 04/05/22 Therapeutic Exercise: NA- prone lying, SKTC, PPTs, and bridging were attempted, but all increased pt's pain Self care: Positioning in SL for pain relief   PATIENT EDUCATION:  Education details: Eval findings, POC 04/11/22: TENS handout Person educated:  Patient Education method: Explanation Education comprehension: verbalized understanding   HOME EXERCISE PROGRAM: Not provided at this time   ASSESSMENT:   CLINICAL IMPRESSION: Patient is a 27 y.o. female who was seen today for physical therapy treatment for Acute midline low back pain without sciatica. She is wearing a heart monitor to assess her episodes of dizziness. On entry from lobby, she reported increased dizziness and used hand rail and support of boyfriend to enter clinic gym. She reports lyumbar pain unchanged with attempts and stretch into flexion or extension. Only relief is sidelying. She was able to complete Hip AROM in sidelying and engage abdominals without increased pain. No formal HEP given. Trial of IFC with HMP which she reported a decrease in pain afterward. She was given info on home TENS via handout.     OBJECTIVE IMPAIRMENTS: decreased activity tolerance, difficulty walking, decreased ROM, decreased strength, postural dysfunction, obesity, and pain.    ACTIVITY LIMITATIONS: carrying, lifting, bending, sitting, standing, squatting, sleeping, stairs, bed mobility, dressing, and locomotion level   PARTICIPATION LIMITATIONS: meal prep, cleaning, laundry, and community activity   PERSONAL FACTORS: Fitness, Past/current experiences, Time since onset of injury/illness/exacerbation, and 1 comorbidity: high BMI  are also affecting patient's functional outcome.    REHAB POTENTIAL: Good   CLINICAL DECISION MAKING: Evolving/moderate complexity   EVALUATION COMPLEXITY: Moderate     GOALS:   SHORT TERM GOALS: Target date: 04/28/22   Pt will be Ind in an initial HEP  Baseline: To be initiated Goal status: INITIAL   2.  Pt will voice understanding of measures to assist in pain reduction  Baseline: To be provided Goal status: INITIAL   3.  Pt will report a decreased in LBP to a range of 2-6/10 for improved QOL Baseline: 4-10/10 Goal status: INITIAL   LONG TERM GOALS:  Target date: 05/26/22   Pt will be Ind in a final HEP to maintain achieved LOF Baseline: To be initiated Goal status: INITIAL   2.  Improve trunk ROM to min limitations or less as indication of decreased pain and improved function. Baseline: see flow sheets Goal status: INITIAL   3.  Pt will report a decrease in LBP to  a range of 0-5/10 for improved function and QOL Baseline: 4-10/10 Goal status: INITIAL   4.  Pt will demonstrate a bridge for 60" and a plank on knees for 30" as indication of improved core strength Baseline: NT Goal status: INITIAL   5.  Pt's Mod Oswestry score will improved to 30% or less as indication of improved function  Baseline: 68% severe disability  Goal status: INITIAL   6.  Improve 5xSTS by MCID of 5" as indication of improved functional mobility  Baseline: TBA Goal status: INITIAL   PLAN:   PT FREQUENCY: 2x/week   PT DURATION: 6 weeks   PLANNED INTERVENTIONS: Therapeutic exercises, Therapeutic activity, Gait training, Patient/Family education, Self Care, Joint mobilization, Stair training, Dry Needling, Electrical stimulation, Spinal mobilization, Cryotherapy, Moist heat, Taping, Traction, Ultrasound, Ionotophoresis '4mg'$ /ml Dexamethasone, Manual therapy, and Re-evaluation.   PLAN FOR NEXT SESSION: Review mod Oswestry; initiate HEP; progress therex as indicated; use of modalities, manual therapy; and TPDN as indicated.   Jessalee Borrell, PTA 04/11/22 1:08 PM Phone: [phone] Fax: [phone]

## 2022-04-11 NOTE — Patient Instructions (Signed)

## 2022-04-12 ENCOUNTER — Ambulatory Visit: Payer: Medicaid Other | Admitting: Family Medicine

## 2022-04-14 ENCOUNTER — Ambulatory Visit: Payer: Medicaid Other | Admitting: Physical Therapy

## 2022-04-14 DIAGNOSIS — M5441 Lumbago with sciatica, right side: Secondary | ICD-10-CM | POA: Diagnosis not present

## 2022-04-14 DIAGNOSIS — R208 Other disturbances of skin sensation: Secondary | ICD-10-CM

## 2022-04-14 DIAGNOSIS — M6281 Muscle weakness (generalized): Secondary | ICD-10-CM

## 2022-04-14 NOTE — Therapy (Signed)
OUTPATIENT PHYSICAL THERAPY TREATMENT NOTE   Patient Name: Melissa Gilmore MRN: [id number] DOB:1995-04-04, 27 y.o., female Today's Date: 04/14/2022  PCP: Dorna Mai, MD   REFERRING PROVIDER: Aldine Contes, MD  END OF SESSION:   PT End of Session - 04/14/22 0803     Visit Number 3    Number of Visits 13    Date for PT Re-Evaluation 05/26/22    Authorization Type MEDICAID [place] ACCESS   04/11/22-04/24/22    Authorization - Visit Number 2    Authorization - Number of Visits 3    PT Start Time 0802    PT Stop Time 0845    PT Time Calculation (min) 43 min             Past Medical History:  Diagnosis Date   Depression    Diabetes mellitus without complication ([place])    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[place])    Schizophrenia ([place])    Past Surgical History:  Procedure Laterality Date   CHOLECYSTECTOMY N/A 07/17/2021   Procedure: SINGLE SITE LAPAROSCOPIC CHOLECYSTECTOMY WITH INTRAOPERATIVE CHOLANGIOGRAM; LIVER BIOPSY;  Surgeon: Michael Boston, MD;  Location: [facility];  Service: General;  Laterality: N/A;   IR EPIDUROGRAPHY  03/21/2022   NO PAST SURGERIES     Patient Active Problem List   Diagnosis Date Noted   Syncope 03/23/2022   Low back pain 03/19/2022   Acute lumbar back pain 03/18/2022   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[place]) 07/17/2021   Schizophrenia ([place]) 07/17/2021   Depression 07/17/2021   Insulin-requiring or dependent type II diabetes mellitus ([place]) 07/17/2021   Body mass index (BMI) 35.0-35.9, adult 07/17/2021   Dyslipidemia 07/17/2021   History of abnormal uterine bleeding 07/17/2021   Acute cholecystitis with chronic cholecystitis 07/16/2021    REFERRING DIAG: (ICD-10-CM) - Acute midline low back pain without sciatica  THERAPY DIAG:  Acute midline low back pain with right-sided sciatica  Muscle weakness (generalized)  Other disturbances of skin sensation  Rationale for Evaluation and Treatment Rehabilitation  PERTINENT HISTORY:  High  BMI; Wearing a heart monitor related to the dizziness.  PRECAUTIONS: None   WEIGHT BEARING RESTRICTIONS: No  SUBJECTIVE:                                                                                                                                                                                      SUBJECTIVE STATEMENT: The estim helped a lot for about 30 minutes after I left last time. I am going to order one next week. I have a lot of pressure today. My right foot went numb yesterday and had shocking pains in it.     PAIN:  Are you having  pain? Yes: NPRS scale: 7/10 Pain location: Midline low back Pain description: pressure Aggravating factors: prolonged standing, walking and sitting Relieving factors: Sometimes lying down, medication Pain range: 4-10/10   OBJECTIVE: (objective measures completed at initial evaluation unless otherwise dated)   DIAGNOSTIC FINDINGS:   MRI lumbar 03/16/22 IMPRESSION: 1. Lumbar disc degeneration at L2-L3 and L3-L4. Moderate-sized caudal disc extrusion at the latter with moderate spinal and moderate to severe right lateral recess stenosis. Query Right L4 radiculitis.   2. Grade 1 anterolisthesis at L5-S1 with no associated pars defects or stenosis.   PATIENT SURVEYS:  LEFS 68% severe disability   SCREENING FOR RED FLAGS: Bowel or bladder incontinence: No Spinal tumors: No Cauda equina syndrome: No Compression fracture: No Abdominal aneurysm: No   COGNITION: Overall cognitive status: Within functional limits for tasks assessed                          SENSATION: WFL   MUSCLE LENGTH: Hamstrings: Right Decreased due to pain deg; Left WNL deg Marcello Moores test: Right NT deg; Left NT deg   POSTURE: rounded shoulders, forward head, and anterior pelvic tilt   PALPATION: TTP to the midline lumboscaral area   LUMBAR ROM:    AROM eval  Flexion Full, min lordosis remains, sharp pain with returning  Extension Min limitation, sharp pain   Right lateral flexion Mod limitation, sharp pain  Left lateral flexion Mod limitation, sharp pain  Right rotation Mod limitation, sharp pain  Left rotation Mod limitation, sharp pain   (Blank rows = not tested)   LOWER EXTREMITY ROM:    Grossly WNLs Active  Right eval Left eval  Hip flexion      Hip extension      Hip abduction      Hip adduction      Hip internal rotation      Hip external rotation      Knee flexion      Knee extension      Ankle dorsiflexion      Ankle plantarflexion      Ankle inversion      Ankle eversion       (Blank rows = not tested)   LOWER EXTREMITY MMT:   Myotome screen negative. Deceased hip strength related to pain. Core weakness. MMT Right eval Left eval  Hip flexion      Hip extension      Hip abduction      Hip adduction      Hip internal rotation      Hip external rotation      Knee flexion      Knee extension      Ankle dorsiflexion      Ankle plantarflexion      Ankle inversion      Ankle eversion       (Blank rows = not tested)   LUMBAR SPECIAL TESTS:  Straight leg raise test: Positive, Slump test: Positive, and SI Compression/distraction test: Negative   FUNCTIONAL TESTS:  5 times sit to stand: TBA , labored STS   GAIT: Distance walked: 200' Assistive device utilized: None Level of assistance: Complete Independence Comments: Decreased pace   TODAY'S TREATMENT:  [facility] Adult PT Treatment:  DATE: 04/14/22 Therapeutic Exercise: Decompression legs in chair 90/90 position- cues for breathing and relaxation SKTC x 5 each from 90/90 Ball squeeze from 90/90- cues pelvic floor activation and breathing  S/L clam  and reverse clam x 10 each  S/L open books- modified hand on chest   Modalities: Estim: IFC to tolerance  18m,  with HMP x 15 minutes       [facility] Adult PT Treatment:                                                DATE: 04/11/22 Therapeutic  Exercise: SKTC PPT LTR S/L Clam x 10, reverse clam x 10  Open books, modified hand on chest x 5 each   Modalities: Estim: IFC to tolerance  152m  with HMP x 15 minutes                                                                                                                                  [facility] Adult PT Treatment:                                                DATE: 04/05/22 Therapeutic Exercise: NA- prone lying, SKTC, PPTs, and bridging were attempted, but all increased pt's pain Self care: Positioning in SL for pain relief   PATIENT EDUCATION:  Education details: Eval findings, POC 04/11/22: TENS handout Person educated: Patient Education method: Explanation Education comprehension: verbalized understanding   HOME EXERCISE PROGRAM: [facility] IvAccess Code: [email or web address] URL: [email or web address] Date: 04/14/2022 Prepared by: JeHessie DienerExercises - Beginner Clam  - 1 x daily - 7 x weekly - 2 sets - 10 reps - Sidelying Reverse Clamshell  - 1 x daily - 7 x weekly - 2 sets - 10 reps - Hooklying Single Knee to Chest Stretch  - 1 x daily - 7 x weekly - 1 sets - 3-5 reps - 10 hold   ASSESSMENT:   CLINICAL IMPRESSION: Patient is a [age].o. female who was seen today for physical therapy treatment for Acute midline low back pain without sciatica. She reports increased pressure in back today. Her Right foot was numb intermittently yesterday. She felt the estim was very helpful and she has plans to order one when she is able. Trial of 90/90 decompression position with pt reporting relief of pressure in back. Also continued with sidelying stabilization as this is her most tolerable position. She was given an initial HEP.  Repeated IFC for pain relief. At end of session pt reported the pain 0/10.      OBJECTIVE IMPAIRMENTS: decreased activity tolerance, difficulty walking, decreased ROM, decreased strength, postural  dysfunction, obesity, and pain.     ACTIVITY LIMITATIONS: carrying, lifting, bending, sitting, standing, squatting, sleeping, stairs, bed mobility, dressing, and locomotion level   PARTICIPATION LIMITATIONS: meal prep, cleaning, laundry, and community activity   PERSONAL FACTORS: Fitness, Past/current experiences, Time since onset of injury/illness/exacerbation, and 1 comorbidity: high BMI  are also affecting patient's functional outcome.    REHAB POTENTIAL: Good   CLINICAL DECISION MAKING: Evolving/moderate complexity   EVALUATION COMPLEXITY: Moderate     GOALS:   SHORT TERM GOALS: Target date: 04/28/22   Pt will be Ind in an initial HEP  Baseline: To be initiated Goal status: INITIAL   2.  Pt will voice understanding of measures to assist in pain reduction  Baseline: To be provided Goal status: INITIAL   3.  Pt will report a decreased in LBP to a range of 2-6/10 for improved QOL Baseline: 4-10/10 Goal status: INITIAL   LONG TERM GOALS: Target date: 05/26/22   Pt will be Ind in a final HEP to maintain achieved LOF Baseline: To be initiated Goal status: INITIAL   2.  Improve trunk ROM to min limitations or less as indication of decreased pain and improved function. Baseline: see flow sheets Goal status: INITIAL   3.  Pt will report a decrease in LBP to a range of 0-5/10 for improved function and QOL Baseline: 4-10/10 Goal status: INITIAL   4.  Pt will demonstrate a bridge for 60" and a plank on knees for 30" as indication of improved core strength Baseline: NT Goal status: INITIAL   5.  Pt's Mod Oswestry score will improved to 30% or less as indication of improved function  Baseline: 68% severe disability  Goal status: INITIAL   6.  Improve 5xSTS by MCID of 5" as indication of improved functional mobility  Baseline: TBA Goal status: INITIAL   PLAN:   PT FREQUENCY: 2x/week   PT DURATION: 6 weeks   PLANNED INTERVENTIONS: Therapeutic exercises, Therapeutic activity, Gait training,  Patient/Family education, Self Care, Joint mobilization, Stair training, Dry Needling, Electrical stimulation, Spinal mobilization, Cryotherapy, Moist heat, Taping, Traction, Ultrasound, Ionotophoresis '4mg'$ /ml Dexamethasone, Manual therapy, and Re-evaluation.   PLAN FOR NEXT SESSION: Review mod Oswestry; initiate HEP; progress therex as indicated; use of modalities, manual therapy; and TPDN as indicated. Continue estim    Taylen Joiner, PTA 04/14/22 8:55 AM Phone: [phone] Fax: [phone]

## 2022-04-16 ENCOUNTER — Encounter (HOSPITAL_COMMUNITY): Payer: Self-pay | Admitting: *Deleted

## 2022-04-16 ENCOUNTER — Emergency Department (HOSPITAL_COMMUNITY)
Admission: EM | Admit: 2022-04-16 | Discharge: 2022-04-16 | Disposition: A | Discharge status: Home / Self Care | Payer: Medicaid Other | Attending: Emergency Medicine | Admitting: Emergency Medicine

## 2022-04-16 ENCOUNTER — Other Ambulatory Visit: Payer: Self-pay

## 2022-04-16 DIAGNOSIS — R739 Hyperglycemia, unspecified: Secondary | ICD-10-CM | POA: Insufficient documentation

## 2022-04-16 DIAGNOSIS — M545 Low back pain, unspecified: Secondary | ICD-10-CM | POA: Diagnosis present

## 2022-04-16 DIAGNOSIS — Z794 Long term (current) use of insulin: Secondary | ICD-10-CM | POA: Diagnosis not present

## 2022-04-16 DIAGNOSIS — M5441 Lumbago with sciatica, right side: Secondary | ICD-10-CM | POA: Diagnosis not present

## 2022-04-16 DIAGNOSIS — G8929 Other chronic pain: Secondary | ICD-10-CM | POA: Insufficient documentation

## 2022-04-16 LAB — COMPREHENSIVE METABOLIC PANEL
ALT: 24 U/L (ref 0–44)
AST: 19 U/L (ref 15–41)
Albumin: 3.6 g/dL (ref 3.5–5.0)
Alkaline Phosphatase: 94 U/L (ref 38–126)
Anion gap: 9 (ref 5–15)
BUN: 8 mg/dL (ref 6–20)
CO2: 22 mmol/L (ref 22–32)
Calcium: 9.4 mg/dL (ref 8.9–10.3)
Chloride: 101 mmol/L (ref 98–111)
Creatinine, Ser: 0.82 mg/dL (ref 0.44–1.00)
GFR, Estimated: >60 mL/min (ref >60)
Glucose, Bld: 540 mg/dL (ref 70–99)
Potassium: 3.7 mmol/L (ref 3.5–5.1)
Sodium: 132 mmol/L — ABNORMAL LOW (ref 135–145)
Total Bilirubin: 0.4 mg/dL (ref 0.3–1.2)
Total Protein: 6.8 g/dL (ref 6.5–8.1)

## 2022-04-16 LAB — CBC
HCT: 39.3 % (ref 36.0–46.0)
Hemoglobin: 13.6 g/dL (ref 12.0–15.0)
MCH: 29.4 pg (ref 26.0–34.0)
MCHC: 34.6 g/dL (ref 30.0–36.0)
MCV: 84.9 fL (ref 80.0–100.0)
Platelets: 272 10*3/uL (ref 150–400)
RBC: 4.63 MIL/uL (ref 3.87–5.11)
RDW: 12.6 % (ref 11.5–15.5)
WBC: 7.2 10*3/uL (ref 4.0–10.5)
nRBC: 0 % (ref 0.0–0.2)

## 2022-04-16 LAB — WET PREP, GENITAL
Clue Cells Wet Prep HPF POC: NONE SEEN
Sperm: NONE SEEN
Trich, Wet Prep: NONE SEEN
WBC, Wet Prep HPF POC: <10 (ref <10)

## 2022-04-16 LAB — I-STAT BETA HCG BLOOD, ED (MC, WL, AP ONLY): I-stat hCG, quantitative: <5 m[IU]/mL (ref <5)

## 2022-04-16 LAB — URINALYSIS, ROUTINE W REFLEX MICROSCOPIC
Bacteria, UA: NONE SEEN
Bilirubin Urine: NEGATIVE
Glucose, UA: >=500 mg/dL — AB
Ketones, ur: NEGATIVE mg/dL
Nitrite: NEGATIVE
Protein, ur: NEGATIVE mg/dL
Specific Gravity, Urine: 1.041 — ABNORMAL HIGH (ref 1.005–1.030)
pH: 6 (ref 5.0–8.0)

## 2022-04-16 LAB — LIPASE, BLOOD: Lipase: 54 U/L — ABNORMAL HIGH (ref 11–51)

## 2022-04-16 MED ORDER — LIDOCAINE 5 % EX PTCH
1.0000 | MEDICATED_PATCH | CUTANEOUS | Status: DC
  Administered 2022-04-16: 1 via TRANSDERMAL
  Filled 2022-04-16: qty 1

## 2022-04-16 MED ORDER — CYCLOBENZAPRINE HCL 10 MG PO TABS: 10.0000 mg | ORAL_TABLET | Freq: Three times a day (TID) | ORAL | 0 refills | Status: DC | PRN

## 2022-04-16 MED ORDER — DEXAMETHASONE SODIUM PHOSPHATE 10 MG/ML IJ SOLN: 10.0000 mg | Freq: Once | INTRAMUSCULAR | Status: DC

## 2022-04-16 MED ORDER — FLUCONAZOLE 150 MG PO TABS
150.0000 mg | ORAL_TABLET | Freq: Once | ORAL | Status: AC
  Administered 2022-04-16: 150 mg via ORAL
  Filled 2022-04-16: qty 1

## 2022-04-16 MED ORDER — KETOROLAC TROMETHAMINE 30 MG/ML IJ SOLN
30.0000 mg | Freq: Once | INTRAMUSCULAR | Status: AC
  Administered 2022-04-16: 30 mg via INTRAMUSCULAR
  Filled 2022-04-16: qty 1

## 2022-04-16 MED ORDER — INSULIN ASPART 100 UNIT/ML IJ SOLN
10.0000 [IU] | Freq: Once | INTRAMUSCULAR | Status: AC
  Administered 2022-04-16: 10 [IU] via SUBCUTANEOUS

## 2022-04-16 MED ORDER — KETOROLAC TROMETHAMINE 30 MG/ML IJ SOLN: 30.0000 mg | Freq: Once | INTRAMUSCULAR | Status: DC

## 2022-04-16 MED ORDER — PREDNISONE 20 MG PO TABS: 40.0000 mg | ORAL_TABLET | Freq: Every day | ORAL | 0 refills | Status: DC

## 2022-04-16 NOTE — Discharge Instructions (Signed)
Please follow-up with your doctor.  You need to monitor your blood sugar closely.  There steroid will cause your blood sugar to be high.  Continue going to physical therapy.  Try and stay as mobile as possible.

## 2022-04-16 NOTE — ED Triage Notes (Signed)
The pt is c/o back pain she has chronic back pain and she has some dizziness  lmp on depo

## 2022-04-16 NOTE — ED Provider Notes (Signed)
[facility] Emergency Department Provider Note MRN:  [id number]  Arrival date & time: 04/16/22     Chief Complaint   Back Pain   History of Present Illness   Melissa Gilmore is a 27 y.o. year-old female presents to the ED with chief complaint of chronic low back pain.  States that she follows with PT and spine.  States that she has pain that radiates down her leg.  She reports associated numbness and tingling.  Denies any bowel or bladder dysfunction.  Has follow-up with spine in the next 2 weeks.  History provided by patient.   Review of Systems  Pertinent positive and negative review of systems noted in HPI.    Physical Exam   Vitals:   04/16/22 0118  BP: (!) 150/114  Pulse: 82  Resp: 17  Temp: 98.3 F (36.8 C)  SpO2: 98%    CONSTITUTIONAL:  non toxic-appearing, NAD NEURO:  Alert and oriented x 3, CN 3-12 grossly intact EYES:  eyes equal and reactive ENT/NECK:  Supple, no stridor  CARDIO:  normal rate, appears well-perfused  PULM:  No respiratory distress, GI/GU:  non-distended,  MSK/SPINE:  No gross deformities, no edema, moves all extremities SKIN:  no rash, atraumatic   *Additional and/or pertinent findings included in MDM below  Diagnostic and Interventional Summary    EKG Interpretation  Date/Time:  Sunday April 16 2022 01:21:12 EST Ventricular Rate:  80 PR Interval:  164 QRS Duration: 80 QT Interval:  350 QTC Calculation: 403 R Axis:   22 Text Interpretation: Normal sinus rhythm Cannot rule out Anterior infarct , age undetermined Abnormal ECG No significant change since last tracing Confirmed by Deno Etienne [id number]) on 04/16/2022 1:46:32 AM       Labs Reviewed  LIPASE, BLOOD - Abnormal; Notable for the following components:      Result Value   Lipase 54 (*)    All other components within normal limits  COMPREHENSIVE METABOLIC PANEL - Abnormal; Notable for the following components:   Sodium 132 (*)    Glucose, Bld  540 (*)    All other components within normal limits  URINALYSIS, ROUTINE W REFLEX MICROSCOPIC - Abnormal; Notable for the following components:   Color, Urine STRAW (*)    Specific Gravity, Urine 1.041 (*)    Glucose, UA >=500 (*)    Hgb urine dipstick SMALL (*)    Leukocytes,Ua TRACE (*)    All other components within normal limits  CBC  I-STAT BETA HCG BLOOD, ED ([facility], [facility], [facility] ONLY)    No orders to display    Medications  insulin aspart (novoLOG) injection 10 Units (has no administration in time range)  ketorolac (TORADOL) 30 MG/ML injection 30 mg (has no administration in time range)  lidocaine (LIDODERM) 5 % 1 patch (has no administration in time range)     Procedures  /  Critical Care Procedures  ED Course and Medical Decision Making  I have reviewed the triage vital signs, the nursing notes, and pertinent available records from the EMR.  Social Determinants Affecting Complexity of Care: Patient has no clinically significant social determinants affecting this chief complaint..   ED Course:    Medical Decision Making Patient here with low back pain.  Has known DDD.  Is followed by PT and spine.  Has follow-up in the next 2 weeks.  Came in today due to worsening pain.   I considered IM decadron, but glucose was 540 today.  I'll hold the decadron,  give some subq insulin, and encourage her to follow-up with her doctor.  I will send her home with some prednisone because I think it will help her symptoms, but I've encouraged her to monitor her blood sugar closely.  I have a low suspicion for cauda equina.  This seems to be and exacerbation of the patient's known back pain.  Risk Prescription drug management.     Consultants: No consultations were needed in caring for this patient.   Treatment and Plan: Emergency department workup does not suggest an emergent condition requiring admission or immediate intervention beyond  what has been performed at this time. The patient  is safe for discharge and has  been instructed to return immediately for worsening symptoms, change in  symptoms or any other concerns    Final Clinical Impressions(s) / ED Diagnoses     ICD-10-CM   1. Chronic right-sided low back pain with right-sided sciatica  M54.41    G89.29       ED Discharge Orders          Ordered    cyclobenzaprine (FLEXERIL) 10 MG tablet  3 times daily PRN        04/16/22 0334    predniSONE (DELTASONE) 20 MG tablet  Daily        04/16/22 0334              Discharge Instructions Discussed with and Provided to Patient:   Discharge Instructions      Please follow-up with your doctor.  You need to monitor your blood sugar closely.  There steroid will cause your blood sugar to be high.  Continue going to physical therapy.  Try and stay as mobile as possible.      Montine Circle, PA-C 04/16/22 [place], [place], DO 04/16/22 [phone]

## 2022-04-17 LAB — GC/CHLAMYDIA PROBE AMP (~~LOC~~) NOT AT ARMC
Chlamydia: NEGATIVE
Comment: NEGATIVE
Comment: NORMAL
Neisseria Gonorrhea: NEGATIVE

## 2022-04-18 NOTE — Therapy (Signed)
OUTPATIENT PHYSICAL THERAPY TREATMENT NOTE/Re-Auth   Patient Name: Shaquay Perlstein MRN: [id number] DOB:October 30, 1995, 27 y.o., female Today's Date: 04/19/2022  PCP: Dorna Mai, MD   REFERRING PROVIDER: Aldine Contes, MD  END OF SESSION:   PT End of Session - 04/19/22 1046     Visit Number 4    Number of Visits 13    Date for PT Re-Evaluation 05/26/22    Authorization Type MEDICAID [place] ACCESS   04/11/22-04/24/22    Authorization - Visit Number 3    Authorization - Number of Visits 3    Progress Note Due on Visit 10    PT Start Time 0932    PT Stop Time 1015    PT Time Calculation (min) 43 min    Activity Tolerance Patient limited by pain    Behavior During Therapy [facility] for tasks assessed/performed              Past Medical History:  Diagnosis Date   Depression    Diabetes mellitus without complication ([place])    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[place])    Schizophrenia ([place])    Past Surgical History:  Procedure Laterality Date   CHOLECYSTECTOMY N/A 07/17/2021   Procedure: SINGLE SITE LAPAROSCOPIC CHOLECYSTECTOMY WITH INTRAOPERATIVE CHOLANGIOGRAM; LIVER BIOPSY;  Surgeon: Michael Boston, MD;  Location: [facility];  Service: General;  Laterality: N/A;   IR EPIDUROGRAPHY  03/21/2022   NO PAST SURGERIES     Patient Active Problem List   Diagnosis Date Noted   Syncope 03/23/2022   Low back pain 03/19/2022   Acute lumbar back pain 03/18/2022   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[place]) 07/17/2021   Schizophrenia ([place]) 07/17/2021   Depression 07/17/2021   Insulin-requiring or dependent type II diabetes mellitus ([place]) 07/17/2021   Body mass index (BMI) 35.0-35.9, adult 07/17/2021   Dyslipidemia 07/17/2021   History of abnormal uterine bleeding 07/17/2021   Acute cholecystitis with chronic cholecystitis 07/16/2021    REFERRING DIAG: (ICD-10-CM) - Acute midline low back pain without sciatica  THERAPY DIAG:  Acute midline low back pain with right-sided  sciatica  Muscle weakness (generalized)  Other disturbances of skin sensation  Rationale for Evaluation and Treatment Rehabilitation  PERTINENT HISTORY:  High BMI; Wearing a heart monitor related to the dizziness.  PRECAUTIONS: None   WEIGHT BEARING RESTRICTIONS: No  SUBJECTIVE:                                                                                                                                                                                      SUBJECTIVE STATEMENT: Pt reports she experienced about 30 mins of relief from her low back pain after the last PT session. Pt endorses  intermittent R foor numbness. Pt states her neurosurgical appt was moved to 04/26/22.   PAIN:  Are you having pain? Yes: NPRS scale: 8/10 Pain location: Midline low back Pain description: pressure Aggravating factors: prolonged standing, walking and sitting Relieving factors: Sometimes lying down, medication Pain range: 4-10/10   OBJECTIVE: (objective measures completed at initial evaluation unless otherwise dated)   DIAGNOSTIC FINDINGS:   MRI lumbar 03/16/22 IMPRESSION: 1. Lumbar disc degeneration at L2-L3 and L3-L4. Moderate-sized caudal disc extrusion at the latter with moderate spinal and moderate to severe right lateral recess stenosis. Query Right L4 radiculitis.   2. Grade 1 anterolisthesis at L5-S1 with no associated pars defects or stenosis.   PATIENT SURVEYS:  LEFS 68% severe disability   SCREENING FOR RED FLAGS: Bowel or bladder incontinence: No Spinal tumors: No Cauda equina syndrome: No Compression fracture: No Abdominal aneurysm: No   COGNITION: Overall cognitive status: Within functional limits for tasks assessed                          SENSATION: WFL   MUSCLE LENGTH: Hamstrings: Right Decreased due to pain deg; Left WNL deg Marcello Moores test: Right NT deg; Left NT deg   POSTURE: rounded shoulders, forward head, and anterior pelvic tilt   PALPATION: TTP to the  midline lumboscaral area   LUMBAR ROM:    AROM eval  Flexion Full, min lordosis remains, sharp pain with returning  Extension Min limitation, sharp pain  Right lateral flexion Mod limitation, sharp pain  Left lateral flexion Mod limitation, sharp pain  Right rotation Mod limitation, sharp pain  Left rotation Mod limitation, sharp pain   (Blank rows = not tested)   LOWER EXTREMITY ROM:    Grossly WNLs Active  Right eval Left eval  Hip flexion      Hip extension      Hip abduction      Hip adduction      Hip internal rotation      Hip external rotation      Knee flexion      Knee extension      Ankle dorsiflexion      Ankle plantarflexion      Ankle inversion      Ankle eversion       (Blank rows = not tested)   LOWER EXTREMITY MMT:   Myotome screen negative. Deceased hip strength related to pain. Core weakness. MMT Right eval Left eval  Hip flexion      Hip extension      Hip abduction      Hip adduction      Hip internal rotation      Hip external rotation      Knee flexion      Knee extension      Ankle dorsiflexion      Ankle plantarflexion      Ankle inversion      Ankle eversion       (Blank rows = not tested)   LUMBAR SPECIAL TESTS:  Straight leg raise test: Positive, Slump test: Positive, and SI Compression/distraction test: Negative   FUNCTIONAL TESTS:  5 times sit to stand: TBA , labored STS   GAIT: Distance walked: 200' Assistive device utilized: None Level of assistance: Complete Independence Comments: Decreased pace   TODAY'S TREATMENT:  [facility] Adult PT Treatment:  DATE: 04/19/22 Therapeutic Exercise: LTR x3, increase in low back pain Bridging x5, increase in low back pain Supine dowel pullovers 2 x10 PPT c bilt arm presses x10 3" PPT c marching 2x10 H/L clams x10 5" Modalities: Estim: IFC to tolerance  43m,  with HMP x 15 minutes   [facility] Adult PT Treatment:                                                 DATE: 04/14/22 Therapeutic Exercise: Decompression legs in chair 90/90 position- cues for breathing and relaxation SKTC x 5 each from 90/90 Ball squeeze from 90/90- cues pelvic floor activation and breathing  S/L clam  and reverse clam x 10 each  S/L open books- modified hand on chest  Modalities: Estim: IFC to tolerance  159m  with HMP x 15 minutes   [facility] Adult PT Treatment:                                                DATE: 04/11/22 Therapeutic Exercise: SKTC PPT LTR S/L Clam x 10, reverse clam x 10  Open books, modified hand on chest x 5 each  Modalities: Estim: IFC to tolerance  1479m with HMP x 15 minutes   PATIENT EDUCATION:  Education details: Eval findings, POC 04/11/22: TENS handout Person educated: Patient Education method: Explanation Education comprehension: verbalized understanding   HOME EXERCISE PROGRAM: [facility] [email or web address] URL: [email or web address] Date: 04/14/2022 Prepared by: JesHessie Dienerxercises - Beginner Clam  - 1 x daily - 7 x weekly - 2 sets - 10 reps - Sidelying Reverse Clamshell  - 1 x daily - 7 x weekly - 2 sets - 10 reps - Hooklying Single Knee to Chest Stretch  - 1 x daily - 7 x weekly - 1 sets - 3-5 reps - 10 hold   ASSESSMENT:   CLINICAL IMPRESSION: PT was completed for gentle lumbopelvic mobility and strengthening therex in a neutral spine, H/L position. After the completion of the prescribed therex, pt reported her low back pain decreased from 8/10 to 4/10. During the PT session, pt reported intermittent R foot N/T. Pt then received IFC estim and moist heat to her low back for pain reduction. Pt responded to the PT session with a decrease in the low back pain. Pt reports her neurosurgical consult has been delayed until next week.Pt will continue to benefit from skilled PT to address impairments for improved function for her low back with less pain.   OBJECTIVE IMPAIRMENTS: decreased  activity tolerance, difficulty walking, decreased ROM, decreased strength, postural dysfunction, obesity, and pain.    ACTIVITY LIMITATIONS: carrying, lifting, bending, sitting, standing, squatting, sleeping, stairs, bed mobility, dressing, and locomotion level   PARTICIPATION LIMITATIONS: meal prep, cleaning, laundry, and community activity   PERSONAL FACTORS: Fitness, Past/current experiences, Time since onset of injury/illness/exacerbation, and 1 comorbidity: high BMI  are also affecting patient's functional outcome.    REHAB POTENTIAL: Good   CLINICAL DECISION MAKING: Evolving/moderate complexity   EVALUATION COMPLEXITY: Moderate     GOALS:   SHORT TERM GOALS: Target date: 04/28/22   Pt will be Ind in an initial HEP  Baseline: To be initiated Status: 04/19/22  Goal status: MET   2.  Pt will voice understanding of measures to assist in pain reduction  Baseline: To be provided Status: 04/19/22 pt has ordered a TENs unit for pain management Goal status: Ongoing   3.  Pt will report a decreased in LBP to a range of 2-6/10 for improved QOL Baseline: 4-10/10 Status: 04/19/22 4-8/10 Goal status: Ongoing   LONG TERM GOALS: Target date: 05/26/22   Pt will be Ind in a final HEP to maintain achieved LOF Baseline: To be initiated Goal status: Ongoing   2.  Improve trunk ROM to min limitations or less as indication of decreased pain and improved function. Baseline: see flow sheets Goal status: Ongoing   3.  Pt will report a decrease in LBP to a range of 0-5/10 for improved function and QOL Baseline: 4-10/10 Goal status: Ongoing   4.  Pt will demonstrate a bridge for 60" and a plank on knees for 30" as indication of improved core strength Baseline: NT Goal status: Ongoing   5.  Pt's Mod Oswestry score will improved to 30% or less as indication of improved function  Baseline: 68% severe disability  Goal status: Ongoing   6.  Improve 5xSTS by MCID of 5" as indication of improved  functional mobility  Baseline: TBA Goal status: Ongoing   PLAN:   PT FREQUENCY: 2x/week   PT DURATION: 6 weeks   PLANNED INTERVENTIONS: Therapeutic exercises, Therapeutic activity, Gait training, Patient/Family education, Self Care, Joint mobilization, Stair training, Dry Needling, Electrical stimulation, Spinal mobilization, Cryotherapy, Moist heat, Taping, Traction, Ultrasound, Ionotophoresis '4mg'$ /ml Dexamethasone, Manual therapy, and Re-evaluation.   PLAN FOR NEXT SESSION: Review mod Oswestry; initiate HEP; progress therex as indicated; use of modalities, manual therapy; and TPDN as indicated. Continue estim   Gar Ponto MS, PT 04/19/22 12:49 PM

## 2022-04-19 ENCOUNTER — Ambulatory Visit: Payer: Medicaid Other

## 2022-04-19 DIAGNOSIS — R208 Other disturbances of skin sensation: Secondary | ICD-10-CM

## 2022-04-19 DIAGNOSIS — M6281 Muscle weakness (generalized): Secondary | ICD-10-CM

## 2022-04-19 DIAGNOSIS — M5441 Lumbago with sciatica, right side: Secondary | ICD-10-CM

## 2022-04-21 ENCOUNTER — Ambulatory Visit: Payer: Medicaid Other | Admitting: Physical Therapy

## 2022-04-21 ENCOUNTER — Encounter: Payer: Self-pay | Admitting: Physical Therapy

## 2022-04-21 DIAGNOSIS — M5441 Lumbago with sciatica, right side: Secondary | ICD-10-CM

## 2022-04-21 DIAGNOSIS — M6281 Muscle weakness (generalized): Secondary | ICD-10-CM

## 2022-04-21 DIAGNOSIS — R208 Other disturbances of skin sensation: Secondary | ICD-10-CM

## 2022-04-21 NOTE — Therapy (Signed)
OUTPATIENT PHYSICAL THERAPY TREATMENT NOTE  Patient Name: Melissa Gilmore MRN: [id number] DOB:Dec 19, 1995, 27 y.o., female Today's Date: 04/21/2022  PCP: Dorna Mai, MD   REFERRING PROVIDER: Aldine Contes, MD  END OF SESSION:   PT End of Session - 04/21/22 0943     Visit Number 5    Number of Visits 13    Date for PT Re-Evaluation 05/26/22    Authorization Type MEDICAID [place] ACCESS   04/11/22-04/24/22    Authorization Time Period submitted 04/20/22 for 12 visits    PT Start Time 0938    PT Stop Time 1016    PT Time Calculation (min) 38 min    Activity Tolerance Patient limited by pain    Behavior During Therapy [facility] for tasks assessed/performed               Past Medical History:  Diagnosis Date   Depression    Diabetes mellitus without complication ([place])    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[place])    Schizophrenia ([place])    Past Surgical History:  Procedure Laterality Date   CHOLECYSTECTOMY N/A 07/17/2021   Procedure: SINGLE SITE LAPAROSCOPIC CHOLECYSTECTOMY WITH INTRAOPERATIVE CHOLANGIOGRAM; LIVER BIOPSY;  Surgeon: Michael Boston, MD;  Location: [facility];  Service: General;  Laterality: N/A;   IR EPIDUROGRAPHY  03/21/2022   NO PAST SURGERIES     Patient Active Problem List   Diagnosis Date Noted   Syncope 03/23/2022   Low back pain 03/19/2022   Acute lumbar back pain 03/18/2022   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[place]) 07/17/2021   Schizophrenia ([place]) 07/17/2021   Depression 07/17/2021   Insulin-requiring or dependent type II diabetes mellitus ([place]) 07/17/2021   Body mass index (BMI) 35.0-35.9, adult 07/17/2021   Dyslipidemia 07/17/2021   History of abnormal uterine bleeding 07/17/2021   Acute cholecystitis with chronic cholecystitis 07/16/2021    REFERRING DIAG: (ICD-10-CM) - Acute midline low back pain without sciatica  THERAPY DIAG:  Acute midline low back pain with right-sided sciatica  Muscle weakness (generalized)  Other disturbances of  skin sensation  Rationale for Evaluation and Treatment Rehabilitation  PERTINENT HISTORY:  High BMI; Wearing a heart monitor related to the dizziness.  PRECAUTIONS: None   WEIGHT BEARING RESTRICTIONS: No  SUBJECTIVE:                                                                                                                                                                                      SUBJECTIVE STATEMENT: Pt went to ED on 04/16/22 for back pain .  She got some medicine, helped a little , Not sure what may have increased it. My cat was hit by a car yesterday.  PAIN:  Are you having pain? Yes: NPRS scale: 9/10 Pain location: Midline low back Pain description: pressure Aggravating factors: prolonged standing, walking and sitting Relieving factors: Sometimes lying down, medication Pain range: 4-10/10   OBJECTIVE: (objective measures completed at initial evaluation unless otherwise dated)   DIAGNOSTIC FINDINGS:   MRI lumbar 03/16/22 IMPRESSION: 1. Lumbar disc degeneration at L2-L3 and L3-L4. Moderate-sized caudal disc extrusion at the latter with moderate spinal and moderate to severe right lateral recess stenosis. Query Right L4 radiculitis.   2. Grade 1 anterolisthesis at L5-S1 with no associated pars defects or stenosis.   PATIENT SURVEYS:  LEFS 68% severe disability   SCREENING FOR RED FLAGS: Bowel or bladder incontinence: No Spinal tumors: No Cauda equina syndrome: No Compression fracture: No Abdominal aneurysm: No   COGNITION: Overall cognitive status: Within functional limits for tasks assessed                          SENSATION: WFL   MUSCLE LENGTH: Hamstrings: Right Decreased due to pain deg; Left WNL deg Marcello Moores test: Right NT deg; Left NT deg   POSTURE: rounded shoulders, forward head, and anterior pelvic tilt   PALPATION: TTP to the midline lumboscaral area   LUMBAR ROM:    AROM eval  Flexion Full, min lordosis remains, sharp pain  with returning  Extension Min limitation, sharp pain  Right lateral flexion Mod limitation, sharp pain  Left lateral flexion Mod limitation, sharp pain  Right rotation Mod limitation, sharp pain  Left rotation Mod limitation, sharp pain   (Blank rows = not tested)   LOWER EXTREMITY ROM:    Grossly WNLs Active  Right eval Left eval  Hip flexion      Hip extension      Hip abduction      Hip adduction      Hip internal rotation      Hip external rotation      Knee flexion      Knee extension      Ankle dorsiflexion      Ankle plantarflexion      Ankle inversion      Ankle eversion       (Blank rows = not tested)   LOWER EXTREMITY MMT:   Myotome screen negative. Deceased hip strength related to pain. Core weakness. MMT Right eval Left eval  Hip flexion      Hip extension      Hip abduction      Hip adduction      Hip internal rotation      Hip external rotation      Knee flexion      Knee extension      Ankle dorsiflexion      Ankle plantarflexion      Ankle inversion      Ankle eversion       (Blank rows = not tested)   LUMBAR SPECIAL TESTS:  Straight leg raise test: Positive, Slump test: Positive, and SI Compression/distraction test: Negative   FUNCTIONAL TESTS:  5 times sit to stand: TBA , labored STS   GAIT: Distance walked: 200' Assistive device utilized: None Level of assistance: Complete Independence Comments: Decreased pace   TODAY'S TREATMENT:    [facility] Adult PT Treatment:  DATE: 04/21/22 Therapeutic Exercise: Supine breathing /core bracing  Marching  x 10  Sidelying clam and reverse clam , used ball between knees Knee to chest , pain on Rt  Sidelying Rt trunk stretch for > 1 min for manual as well  Quadruped child's pose back and then lateral, rocking  Quadruped core bracing x 10 focus on exhale to draw in  Manual Therapy: Passive SLR, LAD Rt LE  Decompression to LS spine manual gentle pressure  caudally Palpation to Rt QL, painful  Self Care: HEP, breathing     [facility] Adult PT Treatment:                                                DATE: 04/19/22 Therapeutic Exercise: LTR x3, increase in low back pain Bridging x5, increase in low back pain Supine dowel pullovers 2 x10 PPT c bilt arm presses x10 3" PPT c marching 2x10 H/L clams x10 5" Modalities: Estim: IFC to tolerance  24mA,  with HMP x 15 minutes   [facility] Adult PT Treatment:                                                DATE: 04/14/22 Therapeutic Exercise: Decompression legs in chair 90/90 position- cues for breathing and relaxation SKTC x 5 each from 90/90 Ball squeeze from 90/90- cues pelvic floor activation and breathing  S/L clam  and reverse clam x 10 each  S/L open books- modified hand on chest  Modalities: Estim: IFC to tolerance  76mA,  with HMP x 15 minutes   [facility] Adult PT Treatment:                                                DATE: 04/11/22 Therapeutic Exercise: SKTC PPT LTR S/L Clam x 10, reverse clam x 10  Open books, modified hand on chest x 5 each  Modalities: Estim: IFC to tolerance  71mA,  with HMP x 15 minutes   PATIENT EDUCATION:  Education details: Eval findings, POC 04/11/22: TENS handout Person educated: Patient Education method: Explanation Education comprehension: verbalized understanding   HOME EXERCISE PROGRAM: Not provided at [facility]  Access Code: [email or web address] URL: [email or web address] Date: 04/14/2022 Prepared by: Hessie Diener  Exercises - Beginner Clam  - 1 x daily - 7 x weekly - 2 sets - 10 reps - Sidelying Reverse Clamshell  - 1 x daily - 7 x weekly - 2 sets - 10 reps - Hooklying Single Knee to Chest Stretch  - 1 x daily - 7 x weekly - 1 sets - 3-5 reps - 10 hold  added bracing and quadruped cat/camel to childs pose  ASSESSMENT:   CLINICAL IMPRESSION: Patient with increased pain throughout session.  She has Rt LE numbness which increases with walking and  becomes heavy.  She was supposed to see Neurosurgery but it was rescheduled.   Advised to continue mobility and movement as best as she can, avoid sitting long periods and activities that increase her LE symptoms. Worked on coordinating breathing with abdominal bracing.  Cont POC as tolerated. Her TENS unit  comes today.    OBJECTIVE IMPAIRMENTS: decreased activity tolerance, difficulty walking, decreased ROM, decreased strength, postural dysfunction, obesity, and pain.    ACTIVITY LIMITATIONS: carrying, lifting, bending, sitting, standing, squatting, sleeping, stairs, bed mobility, dressing, and locomotion level   PARTICIPATION LIMITATIONS: meal prep, cleaning, laundry, and community activity   PERSONAL FACTORS: Fitness, Past/current experiences, Time since onset of injury/illness/exacerbation, and 1 comorbidity: high BMI  are also affecting patient's functional outcome.    REHAB POTENTIAL: Good   CLINICAL DECISION MAKING: Evolving/moderate complexity   EVALUATION COMPLEXITY: Moderate     GOALS:   SHORT TERM GOALS: Target date: 04/28/22   Pt will be Ind in an initial HEP  Baseline: To be initiated Status: 04/19/22 Goal status: MET   2.  Pt will voice understanding of measures to assist in pain reduction  Baseline: To be provided  Status: 04/19/22 pt has ordered a TENs unit for pain management Goal status: Ongoing   3.  Pt will report a decreased in LBP to a range of 2-6/10 for improved QOL Baseline: 4-10/10 Status: 04/19/22 4-8/10 Goal status: Ongoing   LONG TERM GOALS: Target date: 05/26/22   Pt will be Ind in a final HEP to maintain achieved LOF Baseline: To be initiated Goal status: Ongoing   2.  Improve trunk ROM to min limitations or less as indication of decreased pain and improved function. Baseline: see flow sheets Goal status: Ongoing   3.  Pt will report a decrease in LBP to a range of 0-5/10 for improved function and QOL Baseline: 4-10/10 Goal status: Ongoing    4.  Pt will demonstrate a bridge for 60" and a plank on knees for 30" as indication of improved core strength Baseline: NT Goal status: Ongoing   5.  Pt's Mod Oswestry score will improved to 30% or less as indication of improved function  Baseline: 68% severe disability  Goal status: Ongoing   6.  Improve 5xSTS by MCID of 5" as indication of improved functional mobility  Baseline: TBA Goal status: Ongoing   PLAN:   PT FREQUENCY: 2x/week   PT DURATION: 6 weeks   PLANNED INTERVENTIONS: Therapeutic exercises, Therapeutic activity, Gait training, Patient/Family education, Self Care, Joint mobilization, Stair training, Dry Needling, Electrical stimulation, Spinal mobilization, Cryotherapy, Moist heat, Taping, Traction, Ultrasound, Ionotophoresis 4mg /ml Dexamethasone, Manual therapy, and Re-evaluation.   PLAN FOR NEXT SESSION: review HEP; progress therex as indicated; use of modalities, manual therapy; and TPDN as indicated. Continue estim   Raeford Razor, PT 04/21/22 11:35 AM Phone: [phone] Fax: [phone]

## 2022-04-23 ENCOUNTER — Encounter: Payer: Self-pay | Admitting: Family Medicine

## 2022-04-25 NOTE — Telephone Encounter (Signed)
Patient called and offered an appointment for same day. Patient not able to come in but has upcoming appt with another provider. Patient advise to go to UC if legs or back gets any worsen.

## 2022-04-26 ENCOUNTER — Encounter: Payer: Self-pay | Admitting: Physical Therapy

## 2022-04-26 ENCOUNTER — Encounter (HOSPITAL_COMMUNITY): Payer: Self-pay | Admitting: *Deleted

## 2022-04-26 ENCOUNTER — Ambulatory Visit: Payer: Medicaid Other | Admitting: Physical Therapy

## 2022-04-26 ENCOUNTER — Emergency Department (HOSPITAL_COMMUNITY)
Admission: EM | Admit: 2022-04-26 | Discharge: 2022-04-27 | Disposition: A | Discharge status: Home / Self Care | Payer: Medicaid Other | Attending: Emergency Medicine | Admitting: Emergency Medicine

## 2022-04-26 ENCOUNTER — Other Ambulatory Visit: Payer: Self-pay

## 2022-04-26 DIAGNOSIS — D72829 Elevated white blood cell count, unspecified: Secondary | ICD-10-CM | POA: Diagnosis not present

## 2022-04-26 DIAGNOSIS — M549 Dorsalgia, unspecified: Secondary | ICD-10-CM | POA: Diagnosis present

## 2022-04-26 DIAGNOSIS — E119 Type 2 diabetes mellitus without complications: Secondary | ICD-10-CM | POA: Diagnosis not present

## 2022-04-26 DIAGNOSIS — M6281 Muscle weakness (generalized): Secondary | ICD-10-CM

## 2022-04-26 DIAGNOSIS — M5441 Lumbago with sciatica, right side: Secondary | ICD-10-CM | POA: Diagnosis not present

## 2022-04-26 DIAGNOSIS — Z794 Long term (current) use of insulin: Secondary | ICD-10-CM | POA: Insufficient documentation

## 2022-04-26 DIAGNOSIS — R208 Other disturbances of skin sensation: Secondary | ICD-10-CM

## 2022-04-26 DIAGNOSIS — E1165 Type 2 diabetes mellitus with hyperglycemia: Secondary | ICD-10-CM

## 2022-04-26 LAB — CBC
HCT: 41.1 % (ref 36.0–46.0)
Hemoglobin: 14.3 g/dL (ref 12.0–15.0)
MCH: 29.1 pg (ref 26.0–34.0)
MCHC: 34.8 g/dL (ref 30.0–36.0)
MCV: 83.7 fL (ref 80.0–100.0)
Platelets: 295 10*3/uL (ref 150–400)
RBC: 4.91 MIL/uL (ref 3.87–5.11)
RDW: 12.4 % (ref 11.5–15.5)
WBC: 9.1 10*3/uL (ref 4.0–10.5)
nRBC: 0 % (ref 0.0–0.2)

## 2022-04-26 LAB — BASIC METABOLIC PANEL
Anion gap: 10 (ref 5–15)
BUN: 7 mg/dL (ref 6–20)
CO2: 21 mmol/L — ABNORMAL LOW (ref 22–32)
Calcium: 9.1 mg/dL (ref 8.9–10.3)
Chloride: 102 mmol/L (ref 98–111)
Creatinine, Ser: 0.7 mg/dL (ref 0.44–1.00)
GFR, Estimated: >60 mL/min (ref >60)
Glucose, Bld: 295 mg/dL — ABNORMAL HIGH (ref 70–99)
Potassium: 3.4 mmol/L — ABNORMAL LOW (ref 3.5–5.1)
Sodium: 133 mmol/L — ABNORMAL LOW (ref 135–145)

## 2022-04-26 LAB — CBG MONITORING, ED: Glucose-Capillary: 277 mg/dL — ABNORMAL HIGH (ref 70–99)

## 2022-04-26 LAB — I-STAT BETA HCG BLOOD, ED (MC, WL, AP ONLY): I-stat hCG, quantitative: <5 m[IU]/mL (ref <5)

## 2022-04-26 MED ORDER — LIDOCAINE 5 % EX PTCH
1.0000 | MEDICATED_PATCH | CUTANEOUS | Status: DC
  Administered 2022-04-27: 1 via TRANSDERMAL
  Filled 2022-04-26: qty 1

## 2022-04-26 MED ORDER — HYDROCODONE-ACETAMINOPHEN 5-325 MG PO TABS
1.0000 | ORAL_TABLET | Freq: Once | ORAL | Status: AC
  Administered 2022-04-26: 1 via ORAL
  Filled 2022-04-26: qty 1

## 2022-04-26 MED ORDER — KETOROLAC TROMETHAMINE 15 MG/ML IJ SOLN
15.0000 mg | Freq: Once | INTRAMUSCULAR | Status: AC
  Administered 2022-04-27: 15 mg via INTRAVENOUS
  Filled 2022-04-26: qty 1

## 2022-04-26 NOTE — Therapy (Addendum)
OUTPATIENT PHYSICAL THERAPY TREATMENT NOTE DISCHARGE  Patient Name: Melissa Gilmore MRN: [id number] DOB:1996/01/20, 27 y.o., female Today's Date: 04/26/2022  PCP: Dorna Mai, MD   REFERRING PROVIDER: Aldine Contes, MD  END OF SESSION:   PT End of Session - 04/26/22 0933     Visit Number 6    Number of Visits 13    Date for PT Re-Evaluation 05/26/22    Authorization Type MEDICAID [place] ACCESS   04/11/22-04/24/22 ; 3 visits    Authorization Time Period 04/25/22-06/05/22: approved for 12 visits    Authorization - Visit Number 1    Authorization - Number of Visits 12    PT Start Time 0930    PT Stop Time 1023    PT Time Calculation (min) 53 min               Past Medical History:  Diagnosis Date   Depression    Diabetes mellitus without complication ([place])    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[place])    Schizophrenia ([place])    Past Surgical History:  Procedure Laterality Date   CHOLECYSTECTOMY N/A 07/17/2021   Procedure: SINGLE SITE LAPAROSCOPIC CHOLECYSTECTOMY WITH INTRAOPERATIVE CHOLANGIOGRAM; LIVER BIOPSY;  Surgeon: Michael Boston, MD;  Location: [facility];  Service: General;  Laterality: N/A;   IR EPIDUROGRAPHY  03/21/2022   NO PAST SURGERIES     Patient Active Problem List   Diagnosis Date Noted   Syncope 03/23/2022   Low back pain 03/19/2022   Acute lumbar back pain 03/18/2022   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[place]) 07/17/2021   Schizophrenia ([place]) 07/17/2021   Depression 07/17/2021   Insulin-requiring or dependent type II diabetes mellitus ([place]) 07/17/2021   Body mass index (BMI) 35.0-35.9, adult 07/17/2021   Dyslipidemia 07/17/2021   History of abnormal uterine bleeding 07/17/2021   Acute cholecystitis with chronic cholecystitis 07/16/2021    REFERRING DIAG: (ICD-10-CM) - Acute midline low back pain without sciatica  THERAPY DIAG:  Acute midline low back pain with right-sided sciatica  Muscle weakness (generalized)  Other disturbances of skin  sensation  Rationale for Evaluation and Treatment Rehabilitation  PERTINENT HISTORY:  High BMI; Wearing a heart monitor related to the dizziness.  PRECAUTIONS: None   WEIGHT BEARING RESTRICTIONS: No  SUBJECTIVE:                                                                                                                                                                                      SUBJECTIVE STATEMENT: Pt reports Friday and Saturday unable to sleep. Back is 10/10. Leg was numb all day Friday and Friday night.   Called Primary MD and Surgeon. Surgeon prescribed steroids and said to wait  for appt today.     PAIN:  Are you having pain? Yes: NPRS scale: 10/10 Pain location: Midline low back Pain description: pressure Aggravating factors: prolonged standing, walking and sitting Relieving factors: Sometimes lying down, medication Pain range: 4-10/10   OBJECTIVE: (objective measures completed at initial evaluation unless otherwise dated)   DIAGNOSTIC FINDINGS:   MRI lumbar 03/16/22 IMPRESSION: 1. Lumbar disc degeneration at L2-L3 and L3-L4. Moderate-sized caudal disc extrusion at the latter with moderate spinal and moderate to severe right lateral recess stenosis. Query Right L4 radiculitis.   2. Grade 1 anterolisthesis at L5-S1 with no associated pars defects or stenosis.   PATIENT SURVEYS:  LEFS 68% severe disability   SCREENING FOR RED FLAGS: Bowel or bladder incontinence: No Spinal tumors: No Cauda equina syndrome: No Compression fracture: No Abdominal aneurysm: No   COGNITION: Overall cognitive status: Within functional limits for tasks assessed                          SENSATION: WFL   MUSCLE LENGTH: Hamstrings: Right Decreased due to pain deg; Left WNL deg Marcello Moores test: Right NT deg; Left NT deg   POSTURE: rounded shoulders, forward head, and anterior pelvic tilt   PALPATION: TTP to the midline lumboscaral area   LUMBAR ROM:    AROM eval   Flexion Full, min lordosis remains, sharp pain with returning  Extension Min limitation, sharp pain  Right lateral flexion Mod limitation, sharp pain  Left lateral flexion Mod limitation, sharp pain  Right rotation Mod limitation, sharp pain  Left rotation Mod limitation, sharp pain   (Blank rows = not tested)   LOWER EXTREMITY ROM:    Grossly WNLs Active  Right eval Left eval  Hip flexion      Hip extension      Hip abduction      Hip adduction      Hip internal rotation      Hip external rotation      Knee flexion      Knee extension      Ankle dorsiflexion      Ankle plantarflexion      Ankle inversion      Ankle eversion       (Blank rows = not tested)   LOWER EXTREMITY MMT:   Myotome screen negative. Deceased hip strength related to pain. Core weakness. MMT Right eval Left eval  Hip flexion      Hip extension      Hip abduction      Hip adduction      Hip internal rotation      Hip external rotation      Knee flexion      Knee extension      Ankle dorsiflexion      Ankle plantarflexion      Ankle inversion      Ankle eversion       (Blank rows = not tested)   LUMBAR SPECIAL TESTS:  Straight leg raise test: Positive, Slump test: Positive, and SI Compression/distraction test: Negative   FUNCTIONAL TESTS:  5 times sit to stand: TBA , labored STS   GAIT: Distance walked: 200' Assistive device utilized: None Level of assistance: Complete Independence Comments: Decreased pace   TODAY'S TREATMENT:  [facility] Adult PT Treatment:  DATE: 04/26/22 Therapeutic Exercise: Decompression legs in chair 90/90 position- cues for breathing and relaxation SKTC x 5 each from 90/90 Ball squeeze from 90/90- cues pelvic floor activation and breathing  Hooklying- leg lengthener 5 sec x 5 each Hooklying-leg press down 5 sec x 5 each  Figure 4 push and pull bilat  S/L clam  and reverse clam x 10 each  S/L open books- modified hand  on chest  Modalities:  IFC with HMP sidelying to low back x 15 min   [facility] Adult PT Treatment:                                                DATE: 04/21/22 Therapeutic Exercise: Supine breathing /core bracing  Marching  x 10  Sidelying clam and reverse clam , used ball between knees Knee to chest , pain on Rt  Sidelying Rt trunk stretch for > 1 min for manual as well  Quadruped child's pose back and then lateral, rocking  Quadruped core bracing x 10 focus on exhale to draw in  Manual Therapy: Passive SLR, LAD Rt LE  Decompression to LS spine manual gentle pressure caudally Palpation to Rt QL, painful  Self Care: HEP, breathing     [facility] Adult PT Treatment:                                                DATE: 04/19/22 Therapeutic Exercise: LTR x3, increase in low back pain Bridging x5, increase in low back pain Supine dowel pullovers 2 x10 PPT c bilt arm presses x10 3" PPT c marching 2x10 H/L clams x10 5" Modalities: Estim: IFC to tolerance  67mA,  with HMP x 15 minutes   [facility] Adult PT Treatment:                                                DATE: 04/14/22 Therapeutic Exercise: Decompression legs in chair 90/90 position- cues for breathing and relaxation SKTC x 5 each from 90/90 Ball squeeze from 90/90- cues pelvic floor activation and breathing  S/L clam  and reverse clam x 10 each  S/L open books- modified hand on chest  Modalities: Estim: IFC to tolerance  72mA,  with HMP x 15 minutes   [facility] Adult PT Treatment:                                                DATE: 04/11/22 Therapeutic Exercise: SKTC PPT LTR S/L Clam x 10, reverse clam x 10  Open books, modified hand on chest x 5 each  Modalities: Estim: IFC to tolerance  18mA,  with HMP x 15 minutes   PATIENT EDUCATION:  Education details: Eval findings, POC 04/11/22: TENS handout Person educated: Patient Education method: Explanation Education comprehension: verbalized understanding   HOME EXERCISE PROGRAM: Not  provided at [facility]  Access Code: [email or web address] URL: [email or web address] Date: 04/14/2022 Prepared by: Hessie Diener  Exercises - Beginner Clam  - 1 x  daily - 7 x weekly - 2 sets - 10 reps - Sidelying Reverse Clamshell  - 1 x daily - 7 x weekly - 2 sets - 10 reps - Hooklying Single Knee to Chest Stretch  - 1 x daily - 7 x weekly - 1 sets - 3-5 reps - 10 hold  added bracing and quadruped cat/camel to childs pose  ASSESSMENT:   CLINICAL IMPRESSION: Patient with reports of increased pain after PT. She had increased pain Friday and Saturday with reports of numbness in Right LE. She did call surgeon and MD. She has tried steroids this week without relief. She will see Surgeon today. She received her TENS unit and has been using this for pain relief. After MD appt. today  will determine if PT will be of further benefit.     OBJECTIVE IMPAIRMENTS: decreased activity tolerance, difficulty walking, decreased ROM, decreased strength, postural dysfunction, obesity, and pain.    ACTIVITY LIMITATIONS: carrying, lifting, bending, sitting, standing, squatting, sleeping, stairs, bed mobility, dressing, and locomotion level   PARTICIPATION LIMITATIONS: meal prep, cleaning, laundry, and community activity   PERSONAL FACTORS: Fitness, Past/current experiences, Time since onset of injury/illness/exacerbation, and 1 comorbidity: high BMI  are also affecting patient's functional outcome.    REHAB POTENTIAL: Good   CLINICAL DECISION MAKING: Evolving/moderate complexity   EVALUATION COMPLEXITY: Moderate     GOALS:   SHORT TERM GOALS: Target date: 04/28/22   Pt will be Ind in an initial HEP  Baseline: To be initiated Status: 04/19/22 Goal status: MET   2.  Pt will voice understanding of measures to assist in pain reduction  Baseline: To be provided  Status: 04/19/22 pt has ordered a TENs unit for pain management Goal status: Ongoing   3.  Pt will report a decreased in LBP to a range of  2-6/10 for improved QOL Baseline: 4-10/10 Status: 04/19/22 4-8/10 Goal status: Ongoing   LONG TERM GOALS: Target date: 05/26/22   Pt will be Ind in a final HEP to maintain achieved LOF Baseline: To be initiated Goal status: Ongoing   2.  Improve trunk ROM to min limitations or less as indication of decreased pain and improved function. Baseline: see flow sheets Goal status: Ongoing   3.  Pt will report a decrease in LBP to a range of 0-5/10 for improved function and QOL Baseline: 4-10/10 Goal status: Ongoing   4.  Pt will demonstrate a bridge for 60" and a plank on knees for 30" as indication of improved core strength Baseline: NT Goal status: Ongoing   5.  Pt's Mod Oswestry score will improved to 30% or less as indication of improved function  Baseline: 68% severe disability  Goal status: Ongoing   6.  Improve 5xSTS by MCID of 5" as indication of improved functional mobility  Baseline: TBA Goal status: Ongoing   PLAN:   PT FREQUENCY: 2x/week   PT DURATION: 6 weeks   PLANNED INTERVENTIONS: Therapeutic exercises, Therapeutic activity, Gait training, Patient/Family education, Self Care, Joint mobilization, Stair training, Dry Needling, Electrical stimulation, Spinal mobilization, Cryotherapy, Moist heat, Taping, Traction, Ultrasound, Ionotophoresis 4mg /ml Dexamethasone, Manual therapy, and Re-evaluation.   PLAN FOR NEXT SESSION: review HEP; progress therex as indicated; use of modalities, manual therapy; and TPDN as indicated. Continue estim   Huntley Butorac, PTA 04/26/22 10:12 AM Phone: [phone] Fax: [phone]   PHYSICAL THERAPY DISCHARGE SUMMARY  Visits from Start of Care: 5  Current functional level related to goals / functional outcomes: See above  Remaining deficits: Severe pain , see above    Education / Equipment: HEP    Patient agrees to discharge. Patient goals were not met. Patient is being discharged due to lack of progress. Having  surgery Raeford Razor, PT 05/10/22 4:31 PM Phone: [phone] Fax: [phone]

## 2022-04-26 NOTE — ED Provider Triage Note (Signed)
Emergency Medicine Provider Triage Evaluation Note  Melissa Gilmore , a 27 y.o. female  was evaluated in triage.  Pt complains of back pain for the past 2 months.  She has history of herniated disc.  She has been seen by ortho and PT and reports worsening pain today.  Her pain is lower mid back pain that radiates into her right leg.  She has been on steroids for sciatica.  She had an episode of syncope earlier today and continues to feel dizzy and like she might pass out.  She reports this is how she felt last time her back pain was bad.  Denies nausea, vomiting, diarrhea, fever, recent illness.  Review of Systems  Positive: As above Negative: As above  Physical Exam  BP (!) 147/97   Pulse (!) 108   Temp 97.7 F (36.5 C)   Resp 20   Ht 5\' 4"  (1.626 m)   Wt 103.4 kg   LMP 11/22/2021   SpO2 99%   BMI 39.14 kg/m  Gen:   Awake, no distress   Resp:  Normal effort  MSK:   Moves extremities without difficulty  Other:    Medical Decision Making  Medically screening exam initiated at 9:19 PM.  Appropriate orders placed.  Sisilia [place] was informed that the remainder of the evaluation will be completed by another provider, this initial triage assessment does not replace that evaluation, and the importance of remaining in the ED until their evaluation is complete.     Pat Kocher, [place] 04/26/22 2126

## 2022-04-26 NOTE — ED Provider Notes (Signed)
[facility] Provider Note   CSN: [id number] Arrival date & time: 04/26/22  2042     History  Chief Complaint  Patient presents with   Back Pain   Leg Pain    Melissa Gilmore is a 27 y.o. female.  27 year old female with a history of diabetes and lumbar radiculopathy presents to the emergency department with back pain.  Patient has had ongoing back pain for several months.  Was hospitalized and had MRI that showed moderate spinal canal stenosis and severe right lateral recess stenosis of her lumbar spine.  Was seen by neurosurgery and had IR guided epidural steroid injection that improved her pain.  Was discharged home and reports persistent pain since then.  Has been working with physical therapy.  Has been given Flexeril, oxycodone, and oral steroids but reports that her symptoms have persisted.  Today they became acutely worse.  Also reports an episode of urinary incontinence recently and worsening numbness of her right lower extremity.  Denies any sensation changes when she wipes.  No bowel incontinence.  Says that her right leg has been weak since her hospitalization.  Denies any dysuria or frequency.       Home Medications Prior to Admission medications   Medication Sig Start Date End Date Taking? Authorizing Provider  Accu-Chek Softclix Lancets lancets Use to check blood sugar twice daily. Dx E11.65 08/06/20   Charlott Rakes, MD  Blood Glucose Monitoring Suppl (ACCU-CHEK GUIDE ME) w/Device KIT Use to check blood sugar twice daily. Dx E11.65 08/06/20   Charlott Rakes, MD  cyclobenzaprine (FLEXERIL) 10 MG tablet Take 1 tablet (10 mg total) by mouth 3 (three) times daily as needed for muscle spasms. 04/16/22   Montine Circle, PA-C  gabapentin (NEURONTIN) 300 MG capsule Take 1 capsule (300 mg total) by mouth at bedtime. 03/21/22   Linus Galas, MD  glucose blood (ACCU-CHEK GUIDE) test strip Use to check blood sugar twice  daily. Dx E11.65 08/06/20   Charlott Rakes, MD  insulin glargine (LANTUS SOLOSTAR) 100 UNIT/ML Solostar Pen Inject 20 Units into the skin at bedtime. 04/27/22   Quintella Reichert, MD  Insulin Pen Needle (B-D UF III MINI PEN NEEDLES) 31G X 5 MM MISC Use as instructed. Inject into the skin once daily 11/08/20   Gildardo Pounds, NP  liraglutide (VICTOZA) 18 MG/3ML SOPN Inject 1.8 mg into the skin daily. 04/27/22   Quintella Reichert, MD  meclizine (ANTIVERT) 25 MG tablet Take 1 tablet (25 mg total) by mouth 3 (three) times daily as needed for dizziness. 03/16/22   [name], DO  medroxyPROGESTERone (DEPO-PROVERA) 150 MG/ML injection Inject 150 mg into the muscle every 3 (three) months.    [provider]  naproxen (NAPROSYN) 500 MG tablet Take 1 tablet (500 mg total) by mouth 2 (two) times daily as needed (pain). 03/27/22   Barrett Henle, MD  ondansetron (ZOFRAN) 4 MG tablet Take 1 tablet (4 mg total) by mouth every 8 (eight) hours as needed for nausea or vomiting. 03/14/22 03/14/23  Linus Galas, MD  predniSONE (DELTASONE) 20 MG tablet Take 2 tablets (40 mg total) by mouth daily. 04/16/22   Montine Circle, PA-C      Allergies    Patient has no known allergies.    Review of Systems   Review of Systems  Physical Exam Updated Vital Signs BP 130/86 (BP Location: Right Arm)   Pulse 92   Temp 98 F (36.7 C) (Oral)   Resp  18   Ht 5\' 4"  (1.626 m)   Wt 103.4 kg   SpO2 100%   BMI 39.14 kg/m  Physical Exam Vitals and nursing note reviewed.  Constitutional:      General: She is not in acute distress.    Appearance: She is well-developed.  HENT:     Head: Normocephalic and atraumatic.     Right Ear: External ear normal.     Left Ear: External ear normal.     Nose: Nose normal.  Eyes:     Extraocular Movements: Extraocular movements intact.     Conjunctiva/sclera: Conjunctivae normal.     Pupils: Pupils are equal, round, and reactive to light.  Pulmonary:     Effort:  Pulmonary effort is normal. No respiratory distress.  Musculoskeletal:     Cervical back: Normal range of motion and neck supple.     Right lower leg: No edema.     Left lower leg: No edema.  Skin:    General: Skin is warm and dry.  Neurological:     Mental Status: She is alert and oriented to person, place, and time. Mental status is at baseline.     Comments: Lumbar spinal midline tenderness to palpation at approximately L4. No stepoffs noted.   Motor: Muscle bulk and tone are normal. Strength is 5/5 in hip flexion, knee flexion and extension, ankle dorsiflexion and plantar flexion of the left lower extremity.  4/5 strength in the above muscle groups of the right lower extremity after receiving pain medication.  4/5 strength in right lower extremity great toe dorsiflexion.  Sensory: Decreased sensation to light touch in L2 though S1 dermatomes of the right lower extremity.   Psychiatric:        Mood and Affect: Mood normal.     ED Results / Procedures / Treatments   Labs (all labs ordered are listed, but only abnormal results are displayed) Labs Reviewed  BASIC METABOLIC PANEL - Abnormal; Notable for the following components:      Result Value   Sodium 133 (*)    Potassium 3.4 (*)    CO2 21 (*)    Glucose, Bld 295 (*)    All other components within normal limits  URINALYSIS, ROUTINE W REFLEX MICROSCOPIC - Abnormal; Notable for the following components:   APPearance HAZY (*)    Specific Gravity, Urine 1.033 (*)    Glucose, UA >=500 (*)    Hgb urine dipstick SMALL (*)    Leukocytes,Ua SMALL (*)    Bacteria, UA RARE (*)    All other components within normal limits  CBG MONITORING, ED - Abnormal; Notable for the following components:   Glucose-Capillary 277 (*)    All other components within normal limits  CBC  I-STAT BETA HCG BLOOD, ED ([facility], [facility], [facility] ONLY)    EKG EKG Interpretation  Date/Time:  Wednesday April 26 2022 21:19:04 EDT Ventricular Rate:  97 PR  Interval:  164 QRS Duration: 90 QT Interval:  340 QTC Calculation: 431 R Axis:   78 Text Interpretation: Normal sinus rhythm Normal ECG Confirmed by Georgina Snell [phone]) on 04/27/2022 11:05:48 AM  Radiology MR LUMBAR SPINE WO CONTRAST  Result Date: 04/27/2022 CLINICAL DATA:  Initial evaluation for severe back pain with right lower extremity weakness. EXAM: MRI LUMBAR SPINE WITHOUT CONTRAST TECHNIQUE: Multiplanar, multisequence MR imaging of the lumbar spine was performed. No intravenous contrast was administered. COMPARISON:  MRI from 03/16/2022. FINDINGS: Segmentation: Standard. Lowest well-formed disc space labeled the L5-S1 level. Alignment:  3 mm anterolisthesis of L5 on S1, stable. Alignment otherwise normal with preservation of the normal lumbar lordosis. Vertebrae: Mild chronic wedging deformity of the T12 vertebral body, stable. Vertebral body height otherwise maintained with no acute or chronic fracture. Multiple scattered chronic endplate Schmorl's node deformities noted throughout the lumbar spine. Bone marrow signal intensity within normal limits. No worrisome osseous lesions. Minor discogenic reactive endplate changes present about the L3-4 interspace. No other abnormal marrow edema. Conus medullaris and cauda equina: Conus extends to the T12-L1 level. Conus and cauda equina appear normal. Paraspinal and other soft tissues: Unremarkable. Disc levels: L1-2: Small chronic endplate Schmorl's node deformities without disc bulge. No canal or foraminal stenosis. L2-3: Disc desiccation with diffuse disc bulge irregular central to left foraminal disc protrusion is seen (series 9, image 15). Associated annular fissure. Mild bilateral facet spurring. Resultant mild narrowing of the left lateral recess. Central canal remains patent. No significant foraminal stenosis. L3-4: Disc desiccation with diffuse disc bulge. Mild reactive endplate spurring. Superimposed right subarticular disc protrusion with  inferior migration (series 8, image 20). Impingement of the descending right L4 nerve root in the right lateral recess. This is perhaps slightly decreased in size from previous. Mild bilateral facet hypertrophy. Resultant moderate canal with right worse than left lateral recess stenosis. Foramina remain patent. L4-5: Negative interspace. Mild bilateral facet hypertrophy. No canal or foraminal stenosis. L5-S1: Anterolisthesis. Central disc protrusion indents the ventral epidural fat (series 9, image 30). Mild facet spurring. No significant canal or lateral recess stenosis. Foramina remain patent. IMPRESSION: 1. Right subarticular disc protrusion with inferior migration at L3-4, impinging upon the descending right L4 nerve root in the right lateral recess. This is similar but perhaps slightly decreased in size as compared to previous MRI from 03/16/2022. 2. Central to left foraminal disc protrusion at L2-3 with resultant mild left lateral recess stenosis. 3. Central disc protrusion at L5-S1 without significant stenosis or impingement. Electronically Signed   By: Jeannine Boga M.D.   On: 04/27/2022 02:12    Procedures Procedures   Medications Ordered in ED Medications  HYDROcodone-acetaminophen (NORCO/VICODIN) 5-325 MG per tablet 1 tablet (1 tablet Oral Given 04/26/22 2255)  ketorolac (TORADOL) 15 MG/ML injection 15 mg (15 mg Intravenous Given 04/27/22 0021)  oxyCODONE (Oxy IR/ROXICODONE) immediate release tablet 5 mg (5 mg Oral Given 04/27/22 0132)    ED Course/ Medical Decision Making/ A&P Clinical Course as of 04/28/22 1100  Thu Apr 27, 2022  0128 Signed out to Dr Ralene Bathe. [RP]    Clinical Course User Index [RP] Fransico Meadow, MD                             Medical Decision Making Amount and/or Complexity of Data Reviewed Radiology: ordered.  Risk Prescription drug management.   Larine Pruski is a 27 y.o. female with comorbidities that complicate the patient evaluation  including diabetes and lumbar radiculopathy presents emergency department with back pain, incontinence, and right lower extremity numbness and weakness  Initial Ddx:  Spinal cord compression, lumbar radiculopathy, urinary tract infection  MDM:  Appears that patient's neurologic deficits seem to have worsened from her discharge summary that was seen before.  Patient was giving pain medication and had her exam repeated without significant improvement of her weakness.  Also stating that she may be having urinary incontinence so feel that repeat imaging is indicated today.  Will check urinalysis to assess for UTI.  If imaging appears  stable symptoms may be due to lumbar radiculopathy.  Plan:  Urinalysis Pain medication MRI lumbar spine  ED Summary/Re-evaluation:  Urinalysis did show small leukocytes and rare bacteria. Given her lack of urinary symptoms and equivocal urinalysis do not feel that she requires treatment for UTI at this time.  Patient signed out to Dr. Ralene Bathe with MRI and final disposition pending.  This patient presents to the ED for concern of complaints listed in HPI, this involves an extensive number of treatment options, and is a complaint that carries with it a high risk of complications and morbidity. Disposition including potential need for admission considered.   Dispo: Pending remainder of workup  Records reviewed Outpatient Clinic Notes The following labs were independently interpreted: Urinalysis and show no acute abnormality  Final Clinical Impression(s) / ED Diagnoses Final diagnoses:  Acute midline low back pain with right-sided sciatica    Rx / DC Orders ED Discharge Orders          Ordered    insulin glargine (LANTUS SOLOSTAR) 100 UNIT/ML Solostar Pen  Daily at bedtime        04/27/22 0246    liraglutide (VICTOZA) 18 MG/3ML SOPN  Daily        04/27/22 0246              Fransico Meadow, MD 04/28/22 1100

## 2022-04-26 NOTE — Progress Notes (Signed)
Referring-Amelia Redmond Pulling, MD Reason for referral-syncope  HPI: 27 year old female for evaluation of syncope at request of Dorna Mai MD.  Patient recently admitted with back pain.  MRI February 2024 showed disc extrusion, spinal stenosis and right lateral recess stenosis.  She had an epidural steroid injection with improvement.  Prior to her admission she was having syncopal episodes that occurred when her back pain was severe.  Patient was felt likely to have vagal syncope.  Cardiology now asked to evaluate.  Patient began having back pain in January.  It is in the low back and increases with certain movements.  It has been continuous.  She then began having "dizzy spells".  She then began having syncopal episodes by report.  No preceding chest pain, dyspnea, palpitations.  She states she would feel warm all over and then have frank syncope for 1 to 2 minutes.  There was no incontinence or seizure activity.  Prior to the episodes she was able to walk at least 3 hours at a time with no dyspnea, chest pain and she has no history of syncope prior to this.  She states she has had approximately 100 syncopal episodes since January.  Current Outpatient Medications  Medication Sig Dispense Refill   Accu-Chek Softclix Lancets lancets Use to check blood sugar twice daily. Dx E11.65 100 each 3   Blood Glucose Monitoring Suppl (ACCU-CHEK GUIDE ME) w/Device KIT Use to check blood sugar twice daily. Dx E11.65 1 kit 0   glucose blood (ACCU-CHEK GUIDE) test strip Use to check blood sugar twice daily. Dx E11.65 100 each 12   insulin glargine (LANTUS SOLOSTAR) 100 UNIT/ML Solostar Pen Inject 20 Units into the skin at bedtime. 15 mL 0   Insulin Pen Needle (B-D UF III MINI PEN NEEDLES) 31G X 5 MM MISC Use as instructed. Inject into the skin once daily 100 each 3   liraglutide (VICTOZA) 18 MG/3ML SOPN Inject 1.8 mg into the skin daily. 9 mL 0   medroxyPROGESTERone (DEPO-PROVERA) 150 MG/ML injection Inject 150 mg  into the muscle every 3 (three) months.     No current facility-administered medications for this visit.    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[place])    Schizophrenia ([place])     Past Surgical History:  Procedure Laterality Date   CHOLECYSTECTOMY N/A 07/17/2021   Procedure: SINGLE SITE LAPAROSCOPIC CHOLECYSTECTOMY WITH INTRAOPERATIVE CHOLANGIOGRAM; LIVER BIOPSY;  Surgeon: Michael Boston, MD;  Location: [facility];  Service: General;  Laterality: N/A;   IR EPIDUROGRAPHY  03/21/2022    Social History   Socioeconomic History   Marital status: Single    Spouse name: Not on file   Number of children: Not on file   Years of education: Not on file   Highest education level: Not on file  Occupational History   Not on file  Tobacco Use   Smoking status: Never   Smokeless tobacco: Never  Substance and Sexual Activity   Alcohol use: Yes    Comment: Occasional   Drug use: Not Currently   Sexual activity: Not Currently  Other Topics Concern   Not on file  Social History Narrative   Not on file   Social Determinants of Health   Financial Resource Strain: High Risk (03/16/2022)   Overall Financial Resource Strain (CARDIA)    Difficulty of Paying Living Expenses: Hard  Food  Insecurity: Food Insecurity Present (03/18/2022)   Hunger Vital Sign    Worried About Running Out of Food in the Last Year: Often true    Ran Out of Food in the Last Year: Often true  Transportation Needs: No Transportation Needs (03/18/2022)   PRAPARE - Hydrologist (Medical): No    Lack of Transportation (Non-Medical): No  Physical Activity: Not on file  Stress: Stress Concern Present (03/16/2022)   [place]    Feeling of Stress : To some extent  Social Connections: Moderately Isolated (03/16/2022)    Social Connection and Isolation Panel [NHANES]    Frequency of Communication with Friends and Family: More than three times a week    Frequency of Social Gatherings with Friends and Family: More than three times a week    Attends Religious Services: Never    Marine scientist or Organizations: No    Attends Archivist Meetings: Never    Marital Status: Living with partner  Intimate Partner Violence: Not At Risk (03/18/2022)   Humiliation, Afraid, Rape, and Kick questionnaire    Fear of Current or Ex-Partner: No    Emotionally Abused: No    Physically Abused: No    Sexually Abused: No    Family History  Problem Relation Age of Onset   Diabetes Maternal Grandmother    Stomach cancer Maternal Grandmother     ROS: Back pain but no fevers or chills, productive cough, hemoptysis, dysphasia, odynophagia, melena, hematochezia, dysuria, hematuria, rash, seizure activity, orthopnea, PND, pedal edema, claudication. Remaining systems are negative.  Physical Exam:   Blood pressure 122/80, pulse 84, height 5\' 4"  (1.626 m), weight 229 lb (103.9 kg), SpO2 99 %.  General:  Well developed/well nourished in NAD Skin warm/dry Patient not depressed No peripheral clubbing Back-normal HEENT-normal/normal eyelids Neck supple/normal carotid upstroke bilaterally; no bruits; no JVD; no thyromegaly chest - CTA/ normal expansion CV - RRR/normal S1 and S2; no murmurs, rubs or gallops;  PMI nondisplaced Abdomen -NT/ND, no HSM, no mass, + bowel sounds, no bruit 2+ femoral pulses, no bruits Ext-no edema, chords, 2+ DP Neuro-grossly nonfocal  ECG -April 16, 2022-normal sinus rhythm with no ST changes.  Personally reviewed  A/P  1 syncope-episodes sound likely to be vagally mediated.  There is preceding back pain and also "warm feeling".  She did wear a monitor but the results are not available yet.  We will await to exclude significant arrhythmia.  Note she did have syncopal episodes with  the monitor in place.  I will also arrange an echocardiogram to assess LV function.  I asked her to maintain hydration and increase her sodium intake.  Hopefully correcting her back pain will help her episodes as well and she is scheduled for surgery.  I asked her not to drive for now.  I will see her back in 3 to 4 months to make sure she is stable.  2 diabetes mellitus-managed per primary care.  Kirk Ruths, MD

## 2022-04-26 NOTE — ED Triage Notes (Signed)
Patient reports she has had back pain for 2 months.  She has hx of herniated disk.  She has been seen by ortho surgeon and PT.  She reports worsening pain today.  Mid back and into the right leg.  She has recently been prescribed steroids for sciatic type pain.  She took tylenol at 1800.  Patient denies traumatic injury.  She states she does have some incontinence of urine

## 2022-04-27 ENCOUNTER — Emergency Department (HOSPITAL_COMMUNITY): Payer: Medicaid Other

## 2022-04-27 LAB — URINALYSIS, ROUTINE W REFLEX MICROSCOPIC
Bilirubin Urine: NEGATIVE
Glucose, UA: >=500 mg/dL — AB
Ketones, ur: NEGATIVE mg/dL
Nitrite: NEGATIVE
Protein, ur: NEGATIVE mg/dL
Specific Gravity, Urine: 1.033 — ABNORMAL HIGH (ref 1.005–1.030)
pH: 6 (ref 5.0–8.0)

## 2022-04-27 MED ORDER — VICTOZA 18 MG/3ML ~~LOC~~ SOPN: 1.8000 mg | PEN_INJECTOR | Freq: Every day | SUBCUTANEOUS | 0 refills | Status: DC

## 2022-04-27 MED ORDER — OXYCODONE HCL 5 MG PO TABS
5.0000 mg | ORAL_TABLET | ORAL | Status: AC
  Administered 2022-04-27: 5 mg via ORAL
  Filled 2022-04-27: qty 1

## 2022-04-27 MED ORDER — LANTUS SOLOSTAR 100 UNIT/ML ~~LOC~~ SOPN: 20.0000 [IU] | PEN_INJECTOR | Freq: Every day | SUBCUTANEOUS | 0 refills | Status: DC

## 2022-04-27 NOTE — ED Provider Notes (Signed)
Care assumed at 2400.  Patient with history of diabetes, chronic back pain here for evaluation of acute on chronic back pain.  Care assumed pending MRI.  MRI with stable findings, possible improvement compared to recent MRI.  Discussed with patient findings of studies.  Plan to discharge with continuing her home medication regimen with outpatient neurosurgery and PCP follow-up and return precautions.  She is requesting refills on her insulin-will supply these until she can see her PCP.   Quintella Reichert, MD 04/27/22 [phone]

## 2022-04-28 ENCOUNTER — Other Ambulatory Visit: Payer: Self-pay | Admitting: Neurological Surgery

## 2022-04-28 ENCOUNTER — Ambulatory Visit: Payer: Medicaid Other

## 2022-04-28 NOTE — Progress Notes (Deleted)
Erroneous encounter-disregard

## 2022-05-01 ENCOUNTER — Ambulatory Visit: Payer: Medicaid Other | Admitting: Physical Therapy

## 2022-05-02 ENCOUNTER — Ambulatory Visit: Payer: Medicaid Other | Attending: Cardiology | Admitting: Cardiology

## 2022-05-02 ENCOUNTER — Ambulatory Visit (INDEPENDENT_AMBULATORY_CARE_PROVIDER_SITE_OTHER): Payer: Medicaid Other | Admitting: Family Medicine

## 2022-05-02 ENCOUNTER — Emergency Department (HOSPITAL_COMMUNITY)
Admission: EM | Admit: 2022-05-02 | Discharge: 2022-05-02 | Disposition: A | Discharge status: Home / Self Care | Payer: Medicaid Other | Attending: Emergency Medicine | Admitting: Emergency Medicine

## 2022-05-02 ENCOUNTER — Encounter (HOSPITAL_COMMUNITY): Payer: Self-pay

## 2022-05-02 ENCOUNTER — Encounter: Payer: Self-pay | Admitting: Cardiology

## 2022-05-02 VITALS — BP 124/84 | HR 87 | Temp 98.1°F | Resp 16 | Wt 239.0 lb

## 2022-05-02 VITALS — BP 122/80 | HR 84 | Ht 64.0 in | Wt 229.0 lb

## 2022-05-02 DIAGNOSIS — Z794 Long term (current) use of insulin: Secondary | ICD-10-CM | POA: Insufficient documentation

## 2022-05-02 DIAGNOSIS — R300 Dysuria: Secondary | ICD-10-CM

## 2022-05-02 DIAGNOSIS — M5442 Lumbago with sciatica, left side: Secondary | ICD-10-CM

## 2022-05-02 DIAGNOSIS — M5441 Lumbago with sciatica, right side: Secondary | ICD-10-CM | POA: Diagnosis not present

## 2022-05-02 DIAGNOSIS — M549 Dorsalgia, unspecified: Secondary | ICD-10-CM | POA: Diagnosis present

## 2022-05-02 DIAGNOSIS — R55 Syncope and collapse: Secondary | ICD-10-CM

## 2022-05-02 DIAGNOSIS — G8929 Other chronic pain: Secondary | ICD-10-CM

## 2022-05-02 LAB — POCT URINALYSIS DIP (CLINITEK)
Bilirubin, UA: NEGATIVE
Glucose, UA: 500 mg/dL — AB
Ketones, POC UA: NEGATIVE mg/dL
Leukocytes, UA: NEGATIVE
Nitrite, UA: NEGATIVE
Spec Grav, UA: 1.025 (ref 1.010–1.025)
Urobilinogen, UA: 0.2 E.U./dL
pH, UA: 5.5 (ref 5.0–8.0)

## 2022-05-02 MED ORDER — METHYLPREDNISOLONE 4 MG PO TBPK: ORAL_TABLET | ORAL | 0 refills | Status: DC

## 2022-05-02 MED ORDER — KETOROLAC TROMETHAMINE 15 MG/ML IJ SOLN
15.0000 mg | Freq: Once | INTRAMUSCULAR | Status: AC
  Administered 2022-05-02: 15 mg via INTRAMUSCULAR
  Filled 2022-05-02: qty 1

## 2022-05-02 MED ORDER — DICLOFENAC SODIUM 1 % EX GEL: 4.0000 g | Freq: Four times a day (QID) | CUTANEOUS | 0 refills | Status: DC

## 2022-05-02 MED ORDER — ACETAMINOPHEN 500 MG PO TABS
1000.0000 mg | ORAL_TABLET | Freq: Once | ORAL | Status: AC
  Administered 2022-05-02: 1000 mg via ORAL
  Filled 2022-05-02: qty 2

## 2022-05-02 MED ORDER — OXYCODONE HCL 5 MG PO TABS
5.0000 mg | ORAL_TABLET | Freq: Once | ORAL | Status: AC
  Administered 2022-05-02: 5 mg via ORAL
  Filled 2022-05-02: qty 1

## 2022-05-02 MED ORDER — DIAZEPAM 5 MG PO TABS
5.0000 mg | ORAL_TABLET | Freq: Once | ORAL | Status: AC
  Administered 2022-05-02: 5 mg via ORAL
  Filled 2022-05-02: qty 1

## 2022-05-02 NOTE — Discharge Instructions (Addendum)
Your back pain is most likely due to a muscular strain.  There is been a lot of research on back pain, unfortunately the only thing that seems to really help is Tylenol and ibuprofen.  Relative rest is also important to not lift greater than 10 pounds bending or twisting at the waist.  Please follow-up with your family physician.  The other thing that really seems to benefit patients is physical therapy which your doctor may send you for.  Please return to the emergency department for new numbness or weakness to your arms or legs. Difficulty with urinating or urinating or pooping on yourself.  Also if you cannot feel toilet paper when you wipe or get a fever.   Use the gel as prescribed.  Take the steroids as prescribed. Also take tylenol 1000mg (2 extra strength) four times a day.   Call your neurosurgeon in the morning and let them know this happened to you.

## 2022-05-02 NOTE — Pre-Procedure Instructions (Signed)
Surgical Instructions    Your procedure is scheduled on Monday, April 1st.  Report to [facility] Main Entrance "A" at 5:30 A.M., then check in with the Admitting office.  Call this number if you have problems the morning of surgery:  [phone]  If you have any questions prior to your surgery date call [phone]: Open Monday-Friday 8am-4pm If you experience any cold or flu symptoms such as cough, fever, chills, shortness of breath, etc. between now and your scheduled surgery, please notify us at the above number.     Remember:  Do not eat after midnight the night before your surgery  You may drink clear liquids until 4:30 a.m. the morning of your surgery.   Clear liquids allowed are: Water, Non-Citrus Juices (without pulp), Carbonated Beverages, Clear Tea, Black Coffee Only (NO MILK, CREAM OR POWDERED CREAMER of any kind), and Gatorade.    Take these medicines the morning of surgery with A SIP OF WATER  NONE  As of today, STOP taking any Aspirin (unless otherwise instructed by your surgeon) Aleve, Naproxen, Ibuprofen, Motrin, Advil, Goody's, BC's, all herbal medications, fish oil, and all vitamins.  WHAT DO I DO ABOUT MY DIABETES MEDICATION?  THE NIGHT BEFORE SURGERY, take 10 units of insulin glargine (LANTUS SOLOSTAR) insulin.       HOLD liraglutide (VICTOZA) for 24 hours prior to surgery.    HOW TO MANAGE YOUR DIABETES BEFORE AND AFTER SURGERY  Why is it important to control my blood sugar before and after surgery? Improving blood sugar levels before and after surgery helps healing and can limit problems. A way of improving blood sugar control is eating a healthy diet by:  Eating less sugar and carbohydrates  Increasing activity/exercise  Talking with your doctor about reaching your blood sugar goals High blood sugars (greater than 180 mg/dL) can raise your risk of infections and slow your recovery, so you will need to focus on controlling your diabetes during the weeks  before surgery. Make sure that the doctor who takes care of your diabetes knows about your planned surgery including the date and location.  How do I manage my blood sugar before surgery? Check your blood sugar at least 4 times a day, starting 2 days before surgery, to make sure that the level is not too high or low.  Check your blood sugar the morning of your surgery when you wake up and every 2 hours until you get to the Short Stay unit.  If your blood sugar is less than 70 mg/dL, you will need to treat for low blood sugar: Do not take insulin. Treat a low blood sugar (less than 70 mg/dL) with  cup of clear juice (cranberry or apple), 4 glucose tablets, OR glucose gel. Recheck blood sugar in 15 minutes after treatment (to make sure it is greater than 70 mg/dL). If your blood sugar is not greater than 70 mg/dL on recheck, call [phone] for further instructions. Report your blood sugar to the short stay nurse when you get to Short Stay.  If you are admitted to the hospital after surgery: Your blood sugar will be checked by the staff and you will probably be given insulin after surgery (instead of oral diabetes medicines) to make sure you have good blood sugar levels. The goal for blood sugar control after surgery is 80-180 mg/dL.                     Do NOT Smoke (Tobacco/Vaping) for 24 hours  prior to your procedure.  If you use a CPAP at night, you may bring your mask/headgear for your overnight stay.   Contacts, glasses, piercing's, hearing aid's, dentures or partials may not be worn into surgery, please bring cases for these belongings.    For patients admitted to the hospital, discharge time will be determined by your treatment team.   Patients discharged the day of surgery will not be allowed to drive home, and someone needs to stay with them for 24 hours.  SURGICAL WAITING ROOM VISITATION Patients having surgery or a procedure may have no more than 2 support people in the  waiting area - these visitors may rotate.   Children under the age of 29 must have an adult with them who is not the patient. If the patient needs to stay at the hospital during part of their recovery, the visitor guidelines for inpatient rooms apply. Pre-op nurse will coordinate an appropriate time for 1 support person to accompany patient in pre-op.  This support person may not rotate.   Please refer to the [facility] website for the visitor guidelines for Inpatients (after your surgery is over and you are in a regular room).    Special instructions:   [facility]- Preparing For Surgery  Before surgery, you can play an important role. Because skin is not sterile, your skin needs to be as free of germs as possible. You can reduce the number of germs on your skin by washing with CHG (chlorahexidine gluconate) Soap before surgery.  CHG is an antiseptic cleaner which kills germs and bonds with the skin to continue killing germs even after washing.    Oral Hygiene is also important to reduce your risk of infection.  Remember - BRUSH YOUR TEETH THE MORNING OF SURGERY WITH YOUR REGULAR TOOTHPASTE  Please do not use if you have an allergy to CHG or antibacterial soaps. If your skin becomes reddened/irritated stop using the CHG.  Do not shave (including legs and underarms) for at least 48 hours prior to first CHG shower. It is OK to shave your face.  Please follow these instructions carefully.   Shower the NIGHT BEFORE SURGERY and the MORNING OF SURGERY  If you chose to wash your hair, wash your hair first as usual with your normal shampoo.  After you shampoo, rinse your hair and body thoroughly to remove the shampoo.  Use CHG Soap as you would any other liquid soap. You can apply CHG directly to the skin and wash gently with a scrungie or a clean washcloth.   Apply the CHG Soap to your body ONLY FROM THE NECK DOWN.  Do not use on open wounds or open sores. Avoid contact with your eyes, ears,  mouth and genitals (private parts). Wash Face and genitals (private parts)  with your normal soap.   Wash thoroughly, paying special attention to the area where your surgery will be performed.  Thoroughly rinse your body with warm water from the neck down.  DO NOT shower/wash with your normal soap after using and rinsing off the CHG Soap.  Pat yourself dry with a CLEAN TOWEL.  Wear CLEAN PAJAMAS to bed the night before surgery  Place CLEAN SHEETS on your bed the night before your surgery  DO NOT SLEEP WITH PETS.   Day of Surgery: Take a shower with CHG soap. Do not wear jewelry or makeup Do not wear lotions, powders, perfumes, or deodorant. Do not shave 48 hours prior to surgery.   Do  not bring valuables to the hospital.  [facility] is not responsible for any belongings or valuables. Do not wear nail polish, gel polish, artificial nails, or any other type of covering on natural nails (fingers and toes) If you have artificial nails or gel coating that need to be removed by a nail salon, please have this removed prior to surgery. Artificial nails or gel coating may interfere with anesthesia's ability to adequately monitor your vital signs. Wear Clean/Comfortable clothing the morning of surgery Remember to brush your teeth WITH YOUR REGULAR TOOTHPASTE.   Please read over the following fact sheets that you were given.    If you received a COVID test during your pre-op visit  it is requested that you wear a mask when out in public, stay away from anyone that may not be feeling well and notify your surgeon if you develop symptoms. If you have been in contact with anyone that has tested positive in the last 10 days please notify you surgeon.

## 2022-05-02 NOTE — ED Provider Notes (Signed)
[facility] Provider Note   CSN: [id number] Arrival date & time: 05/02/22  2110     History  Chief Complaint  Patient presents with   Back Pain   Dizziness    Melissa Gilmore is a 27 y.o. female.  27 yo F with a chief complaints of right-sided back pain that radiates down the leg.  This has been off and on for her.  She has been seen by neurosurgery and has a planned procedure in less than a week.  She was in the shower and she slipped and caught herself before she fell but had worsening of her back pain and felt like her leg had gone numb.  This is actually been going on for months.  Little bit worse than it had been previously but not severely so.  She denied loss of bowel or bladder.  Denies trauma.   Back Pain Dizziness      Home Medications Prior to Admission medications   Medication Sig Start Date End Date Taking? Authorizing Provider  diclofenac Sodium (VOLTAREN) 1 % GEL Apply 4 g topically 4 (four) times daily. 05/02/22  Yes Deno Etienne, DO  methylPREDNISolone (MEDROL DOSEPAK) 4 MG TBPK tablet Day 1: 8mg  before breakfast, 4 mg after lunch, 4 mg after supper, and 8 mg at bedtime Day 2: 4 mg before breakfast, 4 mg after lunch, 4 mg  after supper, and 8 mg  at bedtime Day 3:  4 mg  before breakfast, 4 mg  after lunch, 4 mg after supper, and 4 mg  at bedtime Day 4: 4 mg  before breakfast, 4 mg  after lunch, and 4 mg at bedtime Day 5: 4 mg  before breakfast and 4 mg at bedtime Day 6: 4 mg  before breakfast 05/02/22  Yes Deno Etienne, DO  Accu-Chek Softclix Lancets lancets Use to check blood sugar twice daily. Dx E11.65 08/06/20   Charlott Rakes, MD  Blood Glucose Monitoring Suppl (ACCU-CHEK GUIDE ME) w/Device KIT Use to check blood sugar twice daily. Dx E11.65 08/06/20   Charlott Rakes, MD  glucose blood (ACCU-CHEK GUIDE) test strip Use to check blood sugar twice daily. Dx E11.65 08/06/20   Charlott Rakes, MD  insulin glargine (LANTUS  SOLOSTAR) 100 UNIT/ML Solostar Pen Inject 20 Units into the skin at bedtime. 04/27/22   Quintella Reichert, MD  Insulin Pen Needle (B-D UF III MINI PEN NEEDLES) 31G X 5 MM MISC Use as instructed. Inject into the skin once daily 11/08/20   Gildardo Pounds, NP  liraglutide (VICTOZA) 18 MG/3ML SOPN Inject 1.8 mg into the skin daily. 04/27/22   Quintella Reichert, MD  medroxyPROGESTERone (DEPO-PROVERA) 150 MG/ML injection Inject 150 mg into the muscle every 3 (three) months.    [provider]      Allergies    Patient has no known allergies.    Review of Systems   Review of Systems  Musculoskeletal:  Positive for back pain.  Neurological:  Positive for dizziness.    Physical Exam Updated Vital Signs SpO2 98%  Physical Exam Vitals and nursing note reviewed.  Constitutional:      General: She is not in acute distress.    Appearance: She is well-developed. She is not diaphoretic.  HENT:     Head: Normocephalic and atraumatic.  Eyes:     Pupils: Pupils are equal, round, and reactive to light.  Cardiovascular:     Rate and Rhythm: Normal rate and regular rhythm.  Heart sounds: No murmur heard.    No friction rub. No gallop.  Pulmonary:     Effort: Pulmonary effort is normal.     Breath sounds: No wheezing or rales.  Abdominal:     General: There is no distension.     Palpations: Abdomen is soft.     Tenderness: There is no abdominal tenderness.  Musculoskeletal:        General: No tenderness.     Cervical back: Normal range of motion and neck supple.     Comments: Pulse intact of the right lower extremity.  Patient does not voluntarily move the leg but does move it spontaneously.  Reflexes are 1+ and equal to the other side.  No clonus.  Negative straight leg raise test.  Skin:    General: Skin is warm and dry.  Neurological:     Mental Status: She is alert and oriented to person, place, and time.  Psychiatric:        Behavior: Behavior normal.     ED Results /  Procedures / Treatments   Labs (all labs ordered are listed, but only abnormal results are displayed) Labs Reviewed - No data to display  EKG None  Radiology Cardiac event monitor  Result Date: 05/02/2022 Normal sinus rhythm, sinus tachycardia. Kirk Ruths, MD   Procedures Procedures    Medications Ordered in ED Medications  ketorolac (TORADOL) 15 MG/ML injection 15 mg (has no administration in time range)  acetaminophen (TYLENOL) tablet 1,000 mg (has no administration in time range)  oxyCODONE (Oxy IR/ROXICODONE) immediate release tablet 5 mg (has no administration in time range)  diazepam (VALIUM) tablet 5 mg (has no administration in time range)    ED Course/ Medical Decision Making/ A&P                             Medical Decision Making Risk OTC drugs. Prescription drug management.   27 yo F with a chief complaints of right-sided low back pain that radiates down the leg.  This been a recurrent problem for her and she also has struggled with numbness and feeling like that leg is weak.  This is on the more severe side of the spectrum that she has been experiencing after she had an episode where she slipped suddenly in the shower but did not actually fall.  Will attempt to treat her acute pain here.  She has recently run out of her steroids which she thinks may be the cause of her acute pain.  She has a scheduled surgery planned on Monday for similar symptoms.  Will have her call her surgeon in the morning.  9:27 PM:  I have discussed the diagnosis/risks/treatment options with the patient.  Evaluation and diagnostic testing in the emergency department does not suggest an emergent condition requiring admission or immediate intervention beyond what has been performed at this time.  They will follow up with PCP. We also discussed returning to the ED immediately if new or worsening sx occur. We discussed the sx which are most concerning (e.g., sudden worsening pain, fever,  inability to tolerate by mouth) that necessitate immediate return. Medications administered to the patient during their visit and any new prescriptions provided to the patient are listed below.  Medications given during this visit Medications  ketorolac (TORADOL) 15 MG/ML injection 15 mg (has no administration in time range)  acetaminophen (TYLENOL) tablet 1,000 mg (has no administration in time range)  oxyCODONE (  Oxy IR/ROXICODONE) immediate release tablet 5 mg (has no administration in time range)  diazepam (VALIUM) tablet 5 mg (has no administration in time range)     The patient appears reasonably screen and/or stabilized for discharge and I doubt any other medical condition or other [facility] requiring further screening, evaluation, or treatment in the ED at this time prior to discharge.          Final Clinical Impression(s) / ED Diagnoses Final diagnoses:  Acute right-sided low back pain with right-sided sciatica    Rx / DC Orders ED Discharge Orders          Ordered    methylPREDNISolone (MEDROL DOSEPAK) 4 MG TBPK tablet        05/02/22 2122    diclofenac Sodium (VOLTAREN) 1 % GEL  4 times daily        05/02/22 2122              Deno Etienne, DO 05/02/22 2127

## 2022-05-02 NOTE — ED Triage Notes (Signed)
Pt BIB EMS with c/o back pain x2 months. Pt states legs gave out while she was in the shower but was able to catch herself and sit down. States she did not fall. Per EMS, diminished sensation to R leg. 100 mcg fentanyl given en route. Pt also reports some dizziness. Pt recently seen by ortho and cardiology. Pt in C-collar on arrival.

## 2022-05-02 NOTE — Patient Instructions (Signed)
Medication Instructions:  Continue same   Lab Work: None ordered   Testing/Procedures: Echo   Follow-Up: At [facility], you and your health needs are our priority.  As part of our continuing mission to provide you with exceptional heart care, we have created designated Provider Care Teams.  These Care Teams include your primary Cardiologist (physician) and Advanced Practice Providers (APPs -  Physician Assistants and Nurse Practitioners) who all work together to provide you with the care you need, when you need it.  We recommend signing up for the patient portal called "MyChart".  Sign up information is provided on this After Visit Summary.  MyChart is used to connect with patients for Virtual Visits (Telemedicine).  Patients are able to view lab/test results, encounter notes, upcoming appointments, etc.  Non-urgent messages can be sent to your provider as well.   To learn more about what you can do with MyChart, go to [email or web address].    Your next appointment:  4 months    Provider:  Lubertha [place]

## 2022-05-03 ENCOUNTER — Telehealth: Payer: Medicaid Other | Admitting: Nurse Practitioner

## 2022-05-03 ENCOUNTER — Encounter (HOSPITAL_COMMUNITY): Payer: Self-pay | Admitting: Vascular Surgery

## 2022-05-03 ENCOUNTER — Encounter (HOSPITAL_COMMUNITY)
Admission: RE | Admit: 2022-05-03 | Discharge: 2022-05-03 | Disposition: A | Discharge status: Home / Self Care | Payer: Medicaid Other | Source: Ambulatory Visit | Attending: Neurological Surgery | Admitting: Neurological Surgery

## 2022-05-03 ENCOUNTER — Other Ambulatory Visit: Payer: Self-pay

## 2022-05-03 ENCOUNTER — Ambulatory Visit: Payer: Medicaid Other | Admitting: Physical Therapy

## 2022-05-03 ENCOUNTER — Encounter (HOSPITAL_COMMUNITY): Payer: Self-pay

## 2022-05-03 VITALS — BP 126/89 | HR 89 | Temp 98.2°F | Resp 18 | Ht 64.0 in | Wt 231.0 lb

## 2022-05-03 DIAGNOSIS — M5116 Intervertebral disc disorders with radiculopathy, lumbar region: Secondary | ICD-10-CM | POA: Insufficient documentation

## 2022-05-03 DIAGNOSIS — Z6839 Body mass index (BMI) 39.0-39.9, adult: Secondary | ICD-10-CM | POA: Diagnosis not present

## 2022-05-03 DIAGNOSIS — Z794 Long term (current) use of insulin: Secondary | ICD-10-CM | POA: Diagnosis not present

## 2022-05-03 DIAGNOSIS — B3731 Acute candidiasis of vulva and vagina: Secondary | ICD-10-CM

## 2022-05-03 DIAGNOSIS — Z01812 Encounter for preprocedural laboratory examination: Secondary | ICD-10-CM | POA: Insufficient documentation

## 2022-05-03 DIAGNOSIS — E669 Obesity, unspecified: Secondary | ICD-10-CM | POA: Insufficient documentation

## 2022-05-03 DIAGNOSIS — Z01818 Encounter for other preprocedural examination: Secondary | ICD-10-CM

## 2022-05-03 DIAGNOSIS — E119 Type 2 diabetes mellitus without complications: Secondary | ICD-10-CM | POA: Diagnosis not present

## 2022-05-03 HISTORY — DX: Unspecified asthma, uncomplicated: J45.909

## 2022-05-03 HISTORY — DX: Anxiety disorder, unspecified: F41.9

## 2022-05-03 HISTORY — DX: Headache, unspecified: R51.9

## 2022-05-03 LAB — GLUCOSE, CAPILLARY: Glucose-Capillary: 379 mg/dL — ABNORMAL HIGH (ref 70–99)

## 2022-05-03 LAB — SURGICAL PCR SCREEN
MRSA, PCR: NEGATIVE
Staphylococcus aureus: NEGATIVE

## 2022-05-03 MED ORDER — FLUCONAZOLE 150 MG PO TABS: ORAL_TABLET | ORAL | 0 refills | Status: DC

## 2022-05-03 NOTE — Progress Notes (Signed)
Virtual Visit Consent   Melissa Gilmore, you are scheduled for a virtual visit with a [facility] provider today. Just as with appointments in the office, your consent must be obtained to participate. Your consent will be active for this visit and any virtual visit you may have with one of our providers in the next 365 days. If you have a MyChart account, a copy of this consent can be sent to you electronically.  As this is a virtual visit, video technology does not allow for your provider to perform a traditional examination. This may limit your provider's ability to fully assess your condition. If your provider identifies any concerns that need to be evaluated in person or the need to arrange testing (such as labs, EKG, etc.), we will make arrangements to do so. Although advances in technology are sophisticated, we cannot ensure that it will always work on either your end or our end. If the connection with a video visit is poor, the visit may have to be switched to a telephone visit. With either a video or telephone visit, we are not always able to ensure that we have a secure connection.  By engaging in this virtual visit, you consent to the provision of healthcare and authorize for your insurance to be billed (if applicable) for the services provided during this visit. Depending on your insurance coverage, you may receive a charge related to this service.  I need to obtain your verbal consent now. Are you willing to proceed with your visit today? Melissa Gilmore has provided verbal consent on 05/03/2022 for a virtual visit (video or telephone). Apolonio Schneiders, FNP  Date: 05/03/2022 11:00 AM  Virtual Visit via Video Note   I, Apolonio Schneiders, connected with  Melissa Gilmore  ([id number], [birth year]) on 05/03/22 at 11:00 AM EDT by a video-enabled telemedicine application and verified that I am speaking with the correct person using two identifiers.  Location: Patient: Virtual  Visit Location Patient: Home Provider: Virtual Visit Location Provider: Home Office   I discussed the limitations of evaluation and management by telemedicine and the availability of in person appointments. The patient expressed understanding and agreed to proceed.    History of Present Illness: Melissa Gilmore is a 27 y.o. who identifies as a female who was assigned female at birth, and is being seen today for possible yeast infection.  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 Dx E11.65, Disp: 100 each, Rfl: 3   Blood Glucose Monitoring Suppl (ACCU-CHEK GUIDE ME) w/Device KIT, Use to check blood sugar twice daily. Dx E11.65, Disp: 1 kit, Rfl: 0  diclofenac Sodium (VOLTAREN) 1 % GEL, Apply 4 g topically 4 (four) times daily., Disp: 100 g, Rfl: 0   glucose blood (ACCU-CHEK GUIDE) test strip, Use to check blood sugar twice daily. Dx E11.65, Disp: 100 each, Rfl: 12   insulin glargine (LANTUS SOLOSTAR) 100 UNIT/ML Solostar Pen, Inject 20 Units into the skin at bedtime., Disp: 15 mL, Rfl: 0   Insulin Pen Needle  (B-D UF III MINI PEN NEEDLES) 31G X 5 MM MISC, Use as instructed. Inject into the skin once daily, Disp: 100 each, Rfl: 3   liraglutide (VICTOZA) 18 MG/3ML SOPN, Inject 1.8 mg into the skin daily., Disp: 9 mL, Rfl: 0   medroxyPROGESTERone (DEPO-PROVERA) 150 MG/ML injection, Inject 150 mg into the muscle every 3 (three) months., Disp: , Rfl:    methylPREDNISolone (MEDROL DOSEPAK) 4 MG TBPK tablet, Day 1: 8mg  before breakfast, 4 mg after lunch, 4 mg after supper, and 8 mg at bedtime Day 2: 4 mg before breakfast, 4 mg after lunch, 4 mg  after supper, and 8 mg  at bedtime Day 3:  4 mg  before breakfast, 4 mg  after lunch, 4 mg after supper, and 4 mg  at bedtime Day 4: 4 mg  before breakfast, 4 mg  after lunch, and 4 mg at bedtime Day 5: 4 mg  before breakfast and 4 mg at bedtime Day 6: 4 mg  before breakfast, Disp: 1 each, Rfl: 0  Observations/Objective: Patient is well-developed, well-nourished in no acute distress.  Resting comfortably  at home.  Head is normocephalic, atraumatic.  No labored breathing.  Speech is clear and coherent with logical content.  Patient is alert and oriented at baseline.    Assessment and Plan: 1. Vaginal yeast infection  - fluconazole (DIFLUCAN) 150 MG tablet; May repeat after 72 hours if needed  Dispense: 2 tablet; Refill: 0     Follow Up Instructions: I discussed the assessment and treatment plan with the patient. The patient was provided an opportunity to ask questions and all were answered. The patient agreed with the plan and demonstrated an understanding of the instructions.  A copy of instructions were sent to the patient via MyChart unless otherwise noted below.   The patient was advised to call back or seek an in-person evaluation if the symptoms worsen or if the condition fails to improve as anticipated.  Time:  I spent 10 minutes with the patient via telehealth technology discussing the above problems/concerns.    Apolonio Schneiders, FNP

## 2022-05-03 NOTE — Progress Notes (Signed)
PCP - Dorna Mai, MD  Cardiologist - Dr Kirk Ruths  PPM/ICD - n/a   Chest x-ray - N/A EKG - 04/28/22 Stress Test - denies ECHO - scheduled for 05/29/22 per patient Cardiac Cath - denies  Sleep Study - denies  Fasting Blood Sugar - CBG today 379. Hgb A1c 11.4 on 03/16/22. Pt states she ate a cup of noodles at 11AM. Pt has not taken Victoza today, but did take her lantus last night. Pt reports that she took steroids last week and noticed her blood sugars in the 200s. She checks her blood sugar twice a day. She states that normally her blood sugar is 160 when not on steroids. Pt educated to notify PCP if blood sugar continually remains elevated over 250. Pt states that she will check CBG and call PCP if needed.   Last dose of GLP1 agonist-  pt takes Victoza daily. Last dose will be 05/06/22.    Blood Thinner Instructions:N/A Aspirin Instructions: N/A  ERAS Protcol - per protocol   COVID TEST- N/A  Pt reports that she has been having syncopal episodes as well as dizziness with her back pain. Pt states she has not passed out since last week, but remains continually dizzy. Pt denies feeling like she will currently pass out, but does state that she feels dizzy. BP 126/89 Pt states that Dr. Stanford Breed told her that she cannot drive right now. Pt wore telemetry monitor February-March and Pt saw Dr. Stanford Breed yesterday. Pt states she was scheduled for an ECHO on 05/29/22.   Anesthesia review: Anesthesia APP notified of elevated blood sugar and dizziness at PAT appointment.   Patient denies shortness of breath, fever, cough and chest pain at PAT appointment   All instructions explained to the patient, with a verbal understanding of the material. Patient agrees to go over the instructions while at home for a better understanding. Patient also instructed to self quarantine after being tested for COVID-19. The opportunity to ask questions was provided.

## 2022-05-03 NOTE — Pre-Procedure Instructions (Signed)
Surgical Instructions    Your procedure is scheduled on Monday, April 1st.  Report to [facility] Main Entrance "A" at 5:30 A.M., then check in with the Admitting office.  Call this number if you have problems the morning of surgery:  [phone]  If you have any questions prior to your surgery date call [phone]: Open Monday-Friday 8am-4pm If you experience any cold or flu symptoms such as cough, fever, chills, shortness of breath, etc. between now and your scheduled surgery, please notify us at the above number.     Remember:  Do not eat after midnight the night before your surgery  You may drink clear liquids until 4:30 a.m. the morning of your surgery.   Clear liquids allowed are: Water, Non-Citrus Juices (without pulp), Carbonated Beverages, Clear Tea, Black Coffee Only (NO MILK, CREAM OR POWDERED CREAMER of any kind), and Gatorade.    Take these medicines the morning of surgery with A SIP OF WATER  NONE  As of today, STOP taking any Aspirin (unless otherwise instructed by your surgeon) Aleve, Naproxen, Ibuprofen, Motrin, Advil, Goody's, BC's, all herbal medications, fish oil, and all vitamins.  WHAT DO I DO ABOUT MY DIABETES MEDICATION?  THE NIGHT BEFORE SURGERY, take 10 units of insulin glargine (LANTUS SOLOSTAR) insulin.       HOLD liraglutide (VICTOZA) for 24 hours prior to surgery. Your last dose should be 05/06/22.   HOW TO MANAGE YOUR DIABETES BEFORE AND AFTER SURGERY  Why is it important to control my blood sugar before and after surgery? Improving blood sugar levels before and after surgery helps healing and can limit problems. A way of improving blood sugar control is eating a healthy diet by:  Eating less sugar and carbohydrates  Increasing activity/exercise  Talking with your doctor about reaching your blood sugar goals High blood sugars (greater than 180 mg/dL) can raise your risk of infections and slow your recovery, so you will need to focus on  controlling your diabetes during the weeks before surgery. Make sure that the doctor who takes care of your diabetes knows about your planned surgery including the date and location.  How do I manage my blood sugar before surgery? Check your blood sugar at least 4 times a day, starting 2 days before surgery, to make sure that the level is not too high or low.  Check your blood sugar the morning of your surgery when you wake up and every 2 hours until you get to the Short Stay unit.  If your blood sugar is less than 70 mg/dL, you will need to treat for low blood sugar: Do not take insulin. Treat a low blood sugar (less than 70 mg/dL) with  cup of clear juice (cranberry or apple), 4 glucose tablets, OR glucose gel. Recheck blood sugar in 15 minutes after treatment (to make sure it is greater than 70 mg/dL). If your blood sugar is not greater than 70 mg/dL on recheck, call [phone] for further instructions. Report your blood sugar to the short stay nurse when you get to Short Stay.  If you are admitted to the hospital after surgery: Your blood sugar will be checked by the staff and you will probably be given insulin after surgery (instead of oral diabetes medicines) to make sure you have good blood sugar levels. The goal for blood sugar control after surgery is 80-180 mg/dL.                     Do NOT  Smoke (Tobacco/Vaping) for 24 hours prior to your procedure.  If you use a CPAP at night, you may bring your mask/headgear for your overnight stay.   Contacts, glasses, piercing's, hearing aid's, dentures or partials may not be worn into surgery, please bring cases for these belongings.    For patients admitted to the hospital, discharge time will be determined by your treatment team.   Patients discharged the day of surgery will not be allowed to drive home, and someone needs to stay with them for 24 hours.  SURGICAL WAITING ROOM VISITATION Patients having surgery or a procedure may  have no more than 2 support people in the waiting area - these visitors may rotate.   Children under the age of 81 must have an adult with them who is not the patient. If the patient needs to stay at the hospital during part of their recovery, the visitor guidelines for inpatient rooms apply. Pre-op nurse will coordinate an appropriate time for 1 support person to accompany patient in pre-op.  This support person may not rotate.   Please refer to the [facility] website for the visitor guidelines for Inpatients (after your surgery is over and you are in a regular room).    Special instructions:   [facility]- Preparing For Surgery  Before surgery, you can play an important role. Because skin is not sterile, your skin needs to be as free of germs as possible. You can reduce the number of germs on your skin by washing with CHG (chlorahexidine gluconate) Soap before surgery.  CHG is an antiseptic cleaner which kills germs and bonds with the skin to continue killing germs even after washing.    Oral Hygiene is also important to reduce your risk of infection.  Remember - BRUSH YOUR TEETH THE MORNING OF SURGERY WITH YOUR REGULAR TOOTHPASTE  Please do not use if you have an allergy to CHG or antibacterial soaps. If your skin becomes reddened/irritated stop using the CHG.  Do not shave (including legs and underarms) for at least 48 hours prior to first CHG shower. It is OK to shave your face.  Please follow these instructions carefully.   Shower the NIGHT BEFORE SURGERY and the MORNING OF SURGERY  If you chose to wash your hair, wash your hair first as usual with your normal shampoo.  After you shampoo, rinse your hair and body thoroughly to remove the shampoo.  Use CHG Soap as you would any other liquid soap. You can apply CHG directly to the skin and wash gently with a scrungie or a clean washcloth.   Apply the CHG Soap to your body ONLY FROM THE NECK DOWN.  Do not use on open wounds or open  sores. Avoid contact with your eyes, ears, mouth and genitals (private parts). Wash Face and genitals (private parts)  with your normal soap.   Wash thoroughly, paying special attention to the area where your surgery will be performed.  Thoroughly rinse your body with warm water from the neck down.  DO NOT shower/wash with your normal soap after using and rinsing off the CHG Soap.  Pat yourself dry with a CLEAN TOWEL.  Wear CLEAN PAJAMAS to bed the night before surgery  Place CLEAN SHEETS on your bed the night before your surgery  DO NOT SLEEP WITH PETS.   Day of Surgery: Take a shower with CHG soap. Do not wear jewelry or makeup Do not wear lotions, powders, perfumes, or deodorant. Do not shave 48 hours prior  to surgery.   Do not bring valuables to the hospital.  [facility] is not responsible for any belongings or valuables. Do not wear nail polish, gel polish, artificial nails, or any other type of covering on natural nails (fingers and toes) If you have artificial nails or gel coating that need to be removed by a nail salon, please have this removed prior to surgery. Artificial nails or gel coating may interfere with anesthesia's ability to adequately monitor your vital signs. Wear Clean/Comfortable clothing the morning of surgery Remember to brush your teeth WITH YOUR REGULAR TOOTHPASTE.   Please read over the following fact sheets that you were given.    If you received a COVID test during your pre-op visit  it is requested that you wear a mask when out in public, stay away from anyone that may not be feeling well and notify your surgeon if you develop symptoms. If you have been in contact with anyone that has tested positive in the last 10 days please notify you surgeon.

## 2022-05-03 NOTE — Progress Notes (Signed)
Anesthesia Chart Review:   Case: [id number] Date/Time: 05/08/22 0715   Procedure: Right L3-4 MIS Laminectomy/Discectomy (Right) - RM 18/3C   Anesthesia type: General   Pre-op diagnosis: Intervertebral disc disorders with radiculopathy, lumbar region   Location: [facility] OR ROOM 18 / [place] OR   Surgeons: Judith Part, MD       DISCUSSION: Patient is a 27 year old female scheduled for the above procedure.   History includes never smoker, DM2, childhood asthma, dissociative identity disorder, cholecystectomy (07/17/21). BMI is consistent with obesity.   Recent cardiology evaluation by Dr. Stanford Breed for dizziness/syncopal spells. He noted also significant back pain with acute admission 03/18/22-03/21/22 and reported symptoms at that time. MRI L-spine showed moderate sized caudal disc extrusion at L3-4 with moderate to severe right lateral recess stenosis, query right L4 radiculitis. Neurosurgery consulted and recommended epidural steroid injection by IR initially with out-patient follow-up. PT/OT also evaluated. Syncopal episodes associated with severe pain and felt to be vasovagal in nature, so treatment aimed at getting pain better controlled.  Lidocaine patch, gabapentin, acetaminophen, Toradol, and as needed oxycodone and dilaudid also given. A1c 11.4% and insulin glargine increased to 30 units and Victoza continued. Decadron thought to be exacerbating hyperglycemia, and was discontinued. She also received SSI while hospitalized. Out-patient cardiac monitor also arranged. Dr. Stanford Breed reviewed and no significant arrhythmia's noted (and she had a syncopal event while wearing the monitor). He thought events were vagally medicated, and asked her to stay hydrated, increased sodium intake, and avoid driving for now. He did order an echo. He hoped that by correcting her back pain, her episodes would improved. He noted that back surgery was scheduled.   Patient had labs on 04/26/22 including a CBC, BMET, UA, and hCG  during ED visit for back and RLE pain/numbness. One episode of urinary incontinence reported. Labs showed Na 133, K 3.4, CO2 21, glucose 295, Cr 0.70, anion gap 10, normal CBC, hCG < 5.0. MRI repeated and felt stable. Close neurosurgery follow-up recommended. Insulin refilled per patient request until she could arrange primary care follow-up.   At 05/03/22 PAT, CBG 379. She had taken Lantus 20 units the night prior but not Victoza yet. She had received another steroid taper for back pain on 05/02/22 with noted increased home glucose readings. She was advised to contact her PCP, particularly if reading consistently > 250--perhaps increasing Lantus could be considered. I also notified Lorriane Shire at Dr. Colleen Can office of currently poorly controlled DM which could lead to delay or cancellation of an elective surgery should result be significantly elevated on arrival. She plans to also follow-up with patient to encourage close monitoring or home CBGs and communication with PCP to inquire if any adjustments recommendations in her DM regimen.    Although Dr. Stanford Breed is aware of surgery plans, he did not specific if the echo ordered needed to be done prior to surgery--as it is not scheduled until 05/29/22. He did not note a murmur or edema on exam. I reached out to Dr. Stanford Breed directly and he communicated, "Ok for surgery prior to echo".   She will get a CBG on arrival. Anesthesia team to evaluate on the day of surgery.    VS: BP 126/89   Pulse 89   Temp 36.8 C   Resp 18   Ht 5\' 4"  (1.626 m)   Wt 104.8 kg   LMP  (Within Months) Comment: LMP October- patient started Depo in October with no period since  BMI 39.65 kg/m  PROVIDERS: Dorna Mai, MD is PCP  Kirk Ruths, MD is cardiologist   LABS: Most recent lab results in [facility] include: Lab Results  Component Value Date   WBC 9.1 04/26/2022   HGB 14.3 04/26/2022   HCT 41.1 04/26/2022   PLT 295 04/26/2022   GLUCOSE 295 (H) 04/26/2022    ALT 24 04/16/2022   AST 19 04/16/2022   NA 133 (L) 04/26/2022   K 3.4 (L) 04/26/2022   CL 102 04/26/2022   CREATININE 0.70 04/26/2022   BUN 7 04/26/2022   CO2 21 (L) 04/26/2022   HGBA1C 11.4 (A) 03/16/2022     IMAGES: MRI L-spine 04/27/22: IMPRESSION: 1. Right subarticular disc protrusion with inferior migration at L3-4, impinging upon the descending right L4 nerve root in the right lateral recess. This is similar but perhaps slightly decreased in size as compared to previous MRI from 03/16/2022. 2. Central to left foraminal disc protrusion at L2-3 with resultant mild left lateral recess stenosis. 3. Central disc protrusion at L5-S1 without significant stenosis or impingement.   CT Abd/pelvis 03/14/22: IMPRESSION: 1. No acute abnormality identified in the abdomen or pelvis.  CT Head 03/14/22: IMPRESSION: Normal head CT.    EKG: 04/26/22: NSR   CV: Echo 05/29/22: Pending appointment.   Long term cardiac monitor 03/25/22 - 04/27/22: Normal sinus rhythm, sinus tachycardia.  - No significant arrhythmia.   Past Medical History:  Diagnosis Date   Anxiety    Childhood asthma    Depression    Major Depression disorder   Diabetes mellitus without complication ([place])    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[place])    Headache    Schizophrenia ([place])     Past Surgical History:  Procedure Laterality Date   CHOLECYSTECTOMY N/A 07/17/2021   Procedure: SINGLE SITE LAPAROSCOPIC CHOLECYSTECTOMY WITH INTRAOPERATIVE CHOLANGIOGRAM; LIVER BIOPSY;  Surgeon: Michael Boston, MD;  Location: [facility] ORS;  Service: General;  Laterality: N/A;   IR EPIDUROGRAPHY  03/21/2022   WISDOM TOOTH EXTRACTION      MEDICATIONS:  Accu-Chek Softclix Lancets lancets   Blood Glucose Monitoring Suppl (ACCU-CHEK GUIDE ME) w/Device KIT   diclofenac Sodium (VOLTAREN) 1 % GEL   fluconazole (DIFLUCAN) 150 MG tablet   glucose blood (ACCU-CHEK GUIDE) test strip   insulin glargine (LANTUS SOLOSTAR) 100 UNIT/ML Solostar Pen    Insulin Pen Needle (B-D UF III MINI PEN NEEDLES) 31G X 5 MM MISC   liraglutide (VICTOZA) 18 MG/3ML SOPN   medroxyPROGESTERone (DEPO-PROVERA) 150 MG/ML injection   methylPREDNISolone (MEDROL DOSEPAK) 4 MG TBPK tablet   No current facility-administered medications for this encounter.    Myra Gianotti, PA-C Surgical Short Stay/Anesthesiology [facility] Phone [phone] [facility] Phone [phone] 05/04/2022 10:32 AM

## 2022-05-04 ENCOUNTER — Ambulatory Visit: Payer: Self-pay

## 2022-05-04 ENCOUNTER — Telehealth: Payer: Self-pay | Admitting: Family Medicine

## 2022-05-04 NOTE — Telephone Encounter (Signed)
Lorriane Shire with [facility] had a concern regarding patient A1C of 11.4 with upcoming Lumbar Discectomy. Lorriane Shire wanted to know if provider could manage her A1c for surgery within 3 days.  Spoke with provider and she did not verbally ok surgery. Provider will see patient and refer her to [facility] for diabetes management. Will give patient 6 wks  to help get diabetes together per pcp.

## 2022-05-04 NOTE — Anesthesia Preprocedure Evaluation (Deleted)
Anesthesia Evaluation    Airway        Dental   Pulmonary           Cardiovascular      Neuro/Psych    GI/Hepatic   Endo/Other  diabetes    Renal/GU      Musculoskeletal   Abdominal   Peds  Hematology   Anesthesia Other Findings   Reproductive/Obstetrics                             Anesthesia Physical Anesthesia Plan  ASA:   Anesthesia Plan:    Post-op Pain Management:    Induction:   PONV Risk Score and Plan:   Airway Management Planned:   Additional Equipment:   Intra-op Plan:   Post-operative Plan:   Informed Consent:   Plan Discussed with:   Anesthesia Plan Comments: (PAT note written 05/04/2022 by Myra Gianotti, PA-C.  )       Anesthesia Quick Evaluation

## 2022-05-04 NOTE — Telephone Encounter (Signed)
  Chief Complaint: hyperglycemia Symptoms: BS 250, has been elevated d/t being on prednisone, pt suppose to have surgery on 05/08/22 Frequency: ongoing for several weeks now  Pertinent Negatives: Patient denies dizziness, or nausea, or frequent urination Disposition: [] ED /[] Urgent Care (no appt availability in office) / [] Appointment(In office/virtual)/ []  [facility]/ [] Home Care/ [] Refused Recommended Disposition /[] [facility]/ [x]  Follow-up with PCP Additional Notes: pt had OV on 05/02/22 with PCP, pt had Pre-Op testing yesterday and CBG was high 379, they advised pt if CBGs stay 250 or > she needed to reach out to PCP. Pt took CBG last night and was 250, she takes Lantus and Victoza at night as well. Pt didn't check CBG this morning d/t being in rush but is asking what she can do because is CBG stays elevated > 250 they told her they will not do surgery. Advised pt I would send message to provider for FU and recommendations and have nurse CB. Pt verbalized understanding.   Summary: blood sugar concern   The patient would like to speak with a member of staff regarding their elevated blood sugar and it's relation to their upcoming back surgery Monday 05/08/22  The patient has been directed by staff at their surgeons office to contact their PCP and further discuss their upcoming procedure  Please contact the patient further when possible         Reason for Disposition  [1] Blood glucose > 300 mg/dL (16.7 mmol/L) AND [2] two or more times in a row  Answer Assessment - Initial Assessment Questions 1. BLOOD GLUCOSE: "What is your blood glucose level?"      Last night 250 2. ONSET: "When did you check the blood glucose?"     Elevated for 2 months now  3. USUAL RANGE: "What is your glucose level usually?" (e.g., usual fasting morning value, usual evening value)     150s 5. TYPE 1 or 2:  "Do you know what type of diabetes you have?"  (e.g., Type 1, Type 2, Gestational;  doesn't know)      DM 2 6. INSULIN: "Do you take insulin?" "What type of insulin(s) do you use? What is the mode of delivery? (syringe, pen; injection or pump)?"      Victoza and Lantus  7. DIABETES PILLS: "Do you take any pills for your diabetes?" If Yes, ask: "Have you missed taking any pills recently?"     no 8. OTHER SYMPTOMS: "Do you have any symptoms?" (e.g., fever, frequent urination, difficulty breathing, dizziness, weakness, vomiting)  Protocols used: Diabetes - High Blood Sugar-A-AH

## 2022-05-04 NOTE — Telephone Encounter (Signed)
CRM called stated [facility] needed to speak with nurse about some concerns fwd call to nurse.

## 2022-05-05 ENCOUNTER — Encounter: Payer: Self-pay | Admitting: Family Medicine

## 2022-05-05 MED ORDER — NITROFURANTOIN MONOHYD MACRO 100 MG PO CAPS: 100.0000 mg | ORAL_CAPSULE | Freq: Two times a day (BID) | ORAL | 0 refills | Status: DC

## 2022-05-05 NOTE — Progress Notes (Signed)
Established Patient Office Visit  Subjective    Patient ID: Melissa Gilmore, female    DOB: 12/27/1995  Age: 27 y.o. MRN: [id number]  CC: No chief complaint on file.   HPI Melissa Gilmore [facility] presents for complaint of chronic back pain and burning with urination. She reports that she is scheduled to have back surgery/injection on next week.    Outpatient Encounter Medications as of 05/02/2022  Medication Sig   Accu-Chek Softclix Lancets lancets Use to check blood sugar twice daily. Dx E11.65   Blood Glucose Monitoring Suppl (ACCU-CHEK GUIDE ME) w/Device KIT Use to check blood sugar twice daily. Dx E11.65   glucose blood (ACCU-CHEK GUIDE) test strip Use to check blood sugar twice daily. Dx E11.65   insulin glargine (LANTUS SOLOSTAR) 100 UNIT/ML Solostar Pen Inject 20 Units into the skin at bedtime.   Insulin Pen Needle (B-D UF III MINI PEN NEEDLES) 31G X 5 MM MISC Use as instructed. Inject into the skin once daily   liraglutide (VICTOZA) 18 MG/3ML SOPN Inject 1.8 mg into the skin daily.   medroxyPROGESTERone (DEPO-PROVERA) 150 MG/ML injection Inject 150 mg into the muscle every 3 (three) months.   No facility-administered encounter medications on file as of 05/02/2022.    Past Medical History:  Diagnosis Date   Anxiety    Childhood asthma    Depression    Major Depression disorder   Diabetes mellitus without complication ([place])    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[place])    Headache    Schizophrenia ([place])     Past Surgical History:  Procedure Laterality Date   CHOLECYSTECTOMY N/A 07/17/2021   Procedure: SINGLE SITE LAPAROSCOPIC CHOLECYSTECTOMY WITH INTRAOPERATIVE CHOLANGIOGRAM; LIVER BIOPSY;  Surgeon: Michael Boston, MD;  Location: [facility] ORS;  Service: General;  Laterality: N/A;   IR EPIDUROGRAPHY  03/21/2022   WISDOM TOOTH EXTRACTION      Family History  Problem Relation Age of Onset   Diabetes Maternal Grandmother    Stomach cancer Maternal Grandmother      Social History   Socioeconomic History   Marital status: Single    Spouse name: Not on file   Number of children: Not on file   Years of education: Not on file   Highest education level: Not on file  Occupational History   Not on file  Tobacco Use   Smoking status: Never   Smokeless tobacco: Never  Vaping Use   Vaping Use: Never used  Substance and Sexual Activity   Alcohol use: Yes    Comment: Occasional- events   Drug use: Not Currently   Sexual activity: Not Currently  Other Topics Concern   Not on file  Social History Narrative   Not on file   Social Determinants of Health   Financial Resource Strain: High Risk (03/16/2022)   Overall Financial Resource Strain (CARDIA)    Difficulty of Paying Living Expenses: Hard  Food Insecurity: Food Insecurity Present (03/18/2022)   Hunger Vital Sign    Worried About Running Out of Food in the Last Year: Often true    Ran Out of Food in the Last Year: Often true  Transportation Needs: No Transportation Needs (03/18/2022)   PRAPARE - Hydrologist (Medical): No    Lack of Transportation (Non-Medical): No  Physical Activity: Not on file  Stress: Stress Concern Present (03/16/2022)   [place]    Feeling of Stress : To some extent  Social Connections: Moderately Isolated (03/16/2022)   Social Connection and Isolation Panel [NHANES]    Frequency of Communication with Friends and Family: More than three times a week    Frequency of Social Gatherings with Friends and Family: More than three times a week    Attends Religious Services: Never    Database administrator or Organizations: No    Attends Banker Meetings: Never    Marital Status: Living with partner  Intimate Partner Violence: Not At Risk (03/18/2022)   Humiliation, Afraid, Rape, and Kick questionnaire    Fear of Current or Ex-Partner: No    Emotionally Abused: No     Physically Abused: No    Sexually Abused: No    Review of Systems  All other systems reviewed and are negative.       Objective    BP 124/84   Pulse 87   Temp 98.1 F (36.7 C) (Oral)   Resp 16   Wt 239 lb (108.4 kg)   SpO2 96%   BMI 41.02 kg/m   Physical Exam Vitals and nursing note reviewed.  Constitutional:      General: She is not in acute distress.    Appearance: She is obese.  Cardiovascular:     Rate and Rhythm: Normal rate and regular rhythm.  Pulmonary:     Effort: Pulmonary effort is normal.     Breath sounds: Normal breath sounds.  Abdominal:     Palpations: Abdomen is soft.     Tenderness: There is no abdominal tenderness.  Neurological:     General: No focal deficit present.     Mental Status: She is alert and oriented to person, place, and time.         Assessment & Plan:    1. Chronic midline low back pain with bilateral sciatica Management as per consultant  2. Dysuria Will treat with macrobid.  - POCT URINALYSIS DIP (CLINITEK)   No follow-ups on file.   [name], MD

## 2022-05-08 ENCOUNTER — Encounter (HOSPITAL_COMMUNITY): Admission: RE | Payer: Self-pay | Source: Home / Self Care

## 2022-05-08 ENCOUNTER — Ambulatory Visit (HOSPITAL_COMMUNITY): Admission: RE | Admit: 2022-05-08 | Payer: Medicaid Other | Source: Home / Self Care | Admitting: Neurological Surgery

## 2022-05-08 ENCOUNTER — Telehealth: Payer: Self-pay | Admitting: *Deleted

## 2022-05-08 SURGERY — LUMBAR LAMINECTOMY/ DECOMPRESSION WITH MET-RX
Anesthesia: General | Laterality: Right

## 2022-05-08 NOTE — Telephone Encounter (Signed)
Due to language barrier, an interpreter was used.  Interpreter name or [place] Interpreter ID #--[id number], Reason for encounter--make appt for patient.  ~Allyana Vogan E, RMA     I have attempted to contact this patient by phone with the following results: left message to return my call on answering machine.

## 2022-05-08 NOTE — Telephone Encounter (Signed)
Patient called very upset that her back procedure ws cancel due to her A1c elevated. Patient said she has been taking steroids for her back x 3 months ,reason her A1c is up. Patient said it was  not her dong, it's the injection.  Patient given appt for medical clearance.

## 2022-05-10 ENCOUNTER — Ambulatory Visit: Payer: Medicaid Other | Admitting: Physical Therapy

## 2022-05-10 ENCOUNTER — Ambulatory Visit (INDEPENDENT_AMBULATORY_CARE_PROVIDER_SITE_OTHER): Payer: Medicaid Other | Admitting: Family Medicine

## 2022-05-10 DIAGNOSIS — Z794 Long term (current) use of insulin: Secondary | ICD-10-CM

## 2022-05-10 DIAGNOSIS — M5442 Lumbago with sciatica, left side: Secondary | ICD-10-CM

## 2022-05-10 DIAGNOSIS — E1165 Type 2 diabetes mellitus with hyperglycemia: Secondary | ICD-10-CM

## 2022-05-10 DIAGNOSIS — G8929 Other chronic pain: Secondary | ICD-10-CM

## 2022-05-10 DIAGNOSIS — M5441 Lumbago with sciatica, right side: Secondary | ICD-10-CM

## 2022-05-10 MED ORDER — ACCU-CHEK GUIDE ME W/DEVICE KIT: PACK | 0 refills | Status: DC

## 2022-05-10 MED ORDER — ACCU-CHEK SOFTCLIX LANCETS MISC: 3 refills | Status: DC

## 2022-05-10 MED ORDER — LANTUS SOLOSTAR 100 UNIT/ML ~~LOC~~ SOPN: 20.0000 [IU] | PEN_INJECTOR | Freq: Every day | SUBCUTANEOUS | 0 refills | Status: DC

## 2022-05-10 MED ORDER — ACCU-CHEK GUIDE VI STRP: ORAL_STRIP | 12 refills | Status: DC

## 2022-05-10 MED ORDER — BD PEN NEEDLE MINI U/F 31G X 5 MM MISC
3 refills | Status: DC
  Filled 2023-01-09: qty 100, 100d supply, fill #0
  Filled 2023-04-23: qty 100, 100d supply, fill #1

## 2022-05-10 NOTE — Telephone Encounter (Signed)
Pt called in and stated she spoke with Dr. Dois Davenport nurse and was given an appointment for medical clearance. She was scheduled today, but she just cannot remember the time. No appointments are scheduled for pt today.  Pt is requesting an appointment as soon as possible.   Please advise.

## 2022-05-10 NOTE — Telephone Encounter (Signed)
Patient called and given apppt

## 2022-05-12 ENCOUNTER — Encounter: Payer: Medicaid Other | Admitting: Physical Therapy

## 2022-05-12 ENCOUNTER — Encounter: Payer: Self-pay | Admitting: Family Medicine

## 2022-05-12 NOTE — Progress Notes (Signed)
Established Patient Office Visit  Subjective    Patient ID: Melissa Gilmore, female    DOB: Sep 17, 1995  Age: 27 y.o. MRN: [id number]  CC: No chief complaint on file.   [facility] for follow up of diabetes. Patient desires to have a back surgery asap but use of steroids has been increasing her blood sugars and her A1c to unacceptable levels. Her back pain is persistent and worsening.    Outpatient Encounter Medications as of 05/10/2022  Medication Sig   Accu-Chek Softclix Lancets lancets Use to check blood sugar twice daily. Dx E11.65   Blood Glucose Monitoring Suppl (ACCU-CHEK GUIDE ME) w/Device KIT Use to check blood sugar twice daily. Dx E11.65   diclofenac Sodium (VOLTAREN) 1 % GEL Apply 4 g topically 4 (four) times daily.   fluconazole (DIFLUCAN) 150 MG tablet May repeat after 72 hours if needed   glucose blood (ACCU-CHEK GUIDE) test strip Use to check blood sugar twice daily. Dx E11.65   insulin glargine (LANTUS SOLOSTAR) 100 UNIT/ML Solostar Pen Inject 20 Units into the skin at bedtime.   Insulin Pen Needle (B-D UF III MINI PEN NEEDLES) 31G X 5 MM MISC Use as instructed. Inject into the skin once daily   liraglutide (VICTOZA) 18 MG/3ML SOPN Inject 1.8 mg into the skin daily.   medroxyPROGESTERone (DEPO-PROVERA) 150 MG/ML injection Inject 150 mg into the muscle every 3 (three) months.   methylPREDNISolone (MEDROL DOSEPAK) 4 MG TBPK tablet Day 1: 8mg  before breakfast, 4 mg after lunch, 4 mg after supper, and 8 mg at bedtime Day 2: 4 mg before breakfast, 4 mg after lunch, 4 mg  after supper, and 8 mg  at bedtime Day 3:  4 mg  before breakfast, 4 mg  after lunch, 4 mg after supper, and 4 mg  at bedtime Day 4: 4 mg  before breakfast, 4 mg  after lunch, and 4 mg at bedtime Day 5: 4 mg  before breakfast and 4 mg at bedtime Day 6: 4 mg  before breakfast   nitrofurantoin, macrocrystal-monohydrate, (MACROBID) 100 MG capsule Take 1 capsule (100 mg total) by  mouth 2 (two) times daily.   [DISCONTINUED] Accu-Chek Softclix Lancets lancets Use to check blood sugar twice daily. Dx E11.65   [DISCONTINUED] Blood Glucose Monitoring Suppl (ACCU-CHEK GUIDE ME) w/Device KIT Use to check blood sugar twice daily. Dx E11.65   [DISCONTINUED] glucose blood (ACCU-CHEK GUIDE) test strip Use to check blood sugar twice daily. Dx E11.65   [DISCONTINUED] insulin glargine (LANTUS SOLOSTAR) 100 UNIT/ML Solostar Pen Inject 20 Units into the skin at bedtime.   [DISCONTINUED] Insulin Pen Needle (B-D UF III MINI PEN NEEDLES) 31G X 5 MM MISC Use as instructed. Inject into the skin once daily   No facility-administered encounter medications on file as of 05/10/2022.    Past Medical History:  Diagnosis Date   Anxiety    Childhood asthma    Depression    Major Depression disorder   Diabetes mellitus without complication (HCC)    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HCC)    Headache    Schizophrenia (HCC)     Past Surgical History:  Procedure Laterality Date   CHOLECYSTECTOMY N/A 07/17/2021   Procedure: SINGLE SITE LAPAROSCOPIC CHOLECYSTECTOMY WITH INTRAOPERATIVE CHOLANGIOGRAM; LIVER BIOPSY;  Surgeon: Karie SodaGross, Steven, MD;  Location: [facility];  Service: General;  Laterality: N/A;   IR EPIDUROGRAPHY  03/21/2022   WISDOM TOOTH EXTRACTION      Family History  Problem Relation  Age of Onset   Diabetes Maternal Grandmother    Stomach cancer Maternal Grandmother     Social History   Socioeconomic History   Marital status: Single    Spouse name: Not on file   Number of children: Not on file   Years of education: Not on file   Highest education level: Not on file  Occupational History   Not on file  Tobacco Use   Smoking status: Never   Smokeless tobacco: Never  Vaping Use   Vaping Use: Never used  Substance and Sexual Activity   Alcohol use: Yes    Comment: Occasional- events   Drug use: Not Currently   Sexual activity: Not Currently  Other Topics Concern   Not on  file  Social History Narrative   Not on file   Social Determinants of Health   Financial Resource Strain: High Risk (03/16/2022)   Overall Financial Resource Strain (CARDIA)    Difficulty of Paying Living Expenses: Hard  Food Insecurity: Food Insecurity Present (03/18/2022)   Hunger Vital Sign    Worried About Running Out of Food in the Last Year: Often true    Ran Out of Food in the Last Year: Often true  Transportation Needs: No Transportation Needs (03/18/2022)   PRAPARE - Administrator, Civil Service (Medical): No    Lack of Transportation (Non-Medical): No  Physical Activity: Not on file  Stress: Stress Concern Present (03/16/2022)   [employer] of Occupational Health - Occupational Stress Questionnaire    Feeling of Stress : To some extent  Social Connections: Moderately Isolated (03/16/2022)   Social Connection and Isolation Panel [NHANES]    Frequency of Communication with Friends and Family: More than three times a week    Frequency of Social Gatherings with Friends and Family: More than three times a week    Attends Religious Services: Never    Database administrator or Organizations: No    Attends Banker Meetings: Never    Marital Status: Living with partner  Intimate Partner Violence: Not At Risk (03/18/2022)   Humiliation, Afraid, Rape, and Kick questionnaire    Fear of Current or Ex-Partner: No    Emotionally Abused: No    Physically Abused: No    Sexually Abused: No    Review of Systems  All other systems reviewed and are negative.       Objective    There were no vitals taken for this visit.  Physical Exam Vitals and nursing note reviewed.  Constitutional:      General: She is not in acute distress.    Appearance: She is obese.  Cardiovascular:     Rate and Rhythm: Normal rate and regular rhythm.  Pulmonary:     Effort: Pulmonary effort is normal.     Breath sounds: Normal breath sounds.  Abdominal:     Palpations:  Abdomen is soft.     Tenderness: There is no abdominal tenderness.  Neurological:     General: No focal deficit present.     Mental Status: She is alert and oriented to person, place, and time.         Assessment & Plan:   1. Chronic midline low back pain with bilateral sciatica Patient awaiting to schedule back surgery  2. Type 2 diabetes mellitus with hyperglycemia, with long-term current use of insulin Will try to get info from consultant regarding how to proceed regarding surgery based on elevated blood glucose and A1c with  steroid usage.  - Insulin Pen Needle (B-D UF III MINI PEN NEEDLES) 31G X 5 MM MISC; Use as instructed. Inject into the skin once daily  Dispense: 100 each; Refill: 3 - insulin glargine (LANTUS SOLOSTAR) 100 UNIT/ML Solostar Pen; Inject 20 Units into the skin at bedtime.  Dispense: 15 mL; Refill: 0  05/15/2022 After discussion with a consultant, it is recommended that RBS on day of surgery be 180 or less and that while in hospital it can be maintained with up to and including an insulin drip. Patient should be vigilant in maintaining blood sugars as close to 180 during convalescence in order to improve healing and reduce chance of post op infection.  No follow-ups on file.   Tommie RaymondWilson, Ariadne Rissmiller P, MD

## 2022-05-16 NOTE — Telephone Encounter (Signed)
Patient has been called and informed that provider has sign her preoperative medical clearance for [facility]  Papers faxed

## 2022-05-17 ENCOUNTER — Encounter: Payer: Medicaid Other | Admitting: Physical Therapy

## 2022-05-17 ENCOUNTER — Other Ambulatory Visit: Payer: Self-pay | Admitting: Neurological Surgery

## 2022-05-19 ENCOUNTER — Telehealth: Payer: Self-pay | Admitting: Family Medicine

## 2022-05-19 NOTE — Telephone Encounter (Signed)
Pt states that she has been trying to get her medications since last week: insulin glargine (LANTUS SOLOSTAR) 100 UNIT/ML Solostar Pen  liraglutide (VICTOZA) 18 MG/3ML SOPN  Per pt her medications are too expensive and she is needing help getting them. Please call pt back.

## 2022-05-22 ENCOUNTER — Other Ambulatory Visit: Payer: Self-pay | Admitting: *Deleted

## 2022-05-22 DIAGNOSIS — E1165 Type 2 diabetes mellitus with hyperglycemia: Secondary | ICD-10-CM

## 2022-05-22 MED ORDER — LANTUS SOLOSTAR 100 UNIT/ML ~~LOC~~ SOPN: 20.0000 [IU] | PEN_INJECTOR | Freq: Every day | SUBCUTANEOUS | 0 refills | Status: DC

## 2022-05-22 NOTE — Telephone Encounter (Signed)
I  have spoken with patient and she will check with [facility] again to check on medication that was previously sent in

## 2022-05-22 NOTE — Telephone Encounter (Signed)
Will forward to correct nurse  

## 2022-05-29 ENCOUNTER — Ambulatory Visit (HOSPITAL_COMMUNITY): Payer: Medicaid Other | Attending: Internal Medicine

## 2022-05-29 DIAGNOSIS — R55 Syncope and collapse: Secondary | ICD-10-CM | POA: Insufficient documentation

## 2022-05-29 LAB — ECHOCARDIOGRAM COMPLETE
Area-P 1/2: 5.06 cm2
S' Lateral: 2.5 cm

## 2022-05-30 NOTE — Pre-Procedure Instructions (Signed)
Surgical Instructions    Your procedure is scheduled on June 06, 2022.  Report to [facility] Main Entrance "A" at 5:30 A.M., then check in with the Admitting office.  Call this number if you have problems the morning of surgery:  [phone]  If you have any questions prior to your surgery date call [phone]: Open Monday-Friday 8am-4pm If you experience any cold or flu symptoms such as cough, fever, chills, shortness of breath, etc. between now and your scheduled surgery, please notify us at the above number.     Remember:  Do not eat or drink after midnight the night before your surgery     Take these medicines the morning of surgery with A SIP OF WATER:  NONE   As of today, STOP taking any Aspirin (unless otherwise instructed by your surgeon) Aleve, Naproxen, Ibuprofen, Motrin, Advil, Goody's, BC's, all herbal medications, fish oil, and all vitamins.   WHAT DO I DO ABOUT MY DIABETES MEDICATION?  THE NIGHT BEFORE SURGERY, take 12.5 units of insulin glargine (LANTUS SOLOSTAR) insulin.      STOP taking your liraglutide (VICTOZA) injection 24 hours prior to surgery. Your last dose will be the April 28th.   HOW TO MANAGE YOUR DIABETES BEFORE AND AFTER SURGERY  Why is it important to control my blood sugar before and after surgery? Improving blood sugar levels before and after surgery helps healing and can limit problems. A way of improving blood sugar control is eating a healthy diet by:  Eating less sugar and carbohydrates  Increasing activity/exercise  Talking with your doctor about reaching your blood sugar goals High blood sugars (greater than 180 mg/dL) can raise your risk of infections and slow your recovery, so you will need to focus on controlling your diabetes during the weeks before surgery. Make sure that the doctor who takes care of your diabetes knows about your planned surgery including the date and location.  How do I manage my blood sugar before  surgery? Check your blood sugar at least 4 times a day, starting 2 days before surgery, to make sure that the level is not too high or low.  Check your blood sugar the morning of your surgery when you wake up and every 2 hours until you get to the Short Stay unit.  If your blood sugar is less than 70 mg/dL, you will need to treat for low blood sugar: Do not take insulin. Treat a low blood sugar (less than 70 mg/dL) with  cup of clear juice (cranberry or apple), 4 glucose tablets, OR glucose gel. Recheck blood sugar in 15 minutes after treatment (to make sure it is greater than 70 mg/dL). If your blood sugar is not greater than 70 mg/dL on recheck, call [phone] for further instructions. Report your blood sugar to the short stay nurse when you get to Short Stay.  If you are admitted to the hospital after surgery: Your blood sugar will be checked by the staff and you will probably be given insulin after surgery (instead of oral diabetes medicines) to make sure you have good blood sugar levels. The goal for blood sugar control after surgery is 80-180 mg/dL.                      Do NOT Smoke (Tobacco/Vaping) for 24 hours prior to your procedure.  If you use a CPAP at night, you may bring your mask/headgear for your overnight stay.   Contacts, glasses, piercing's, hearing aid's, dentures  or partials may not be worn into surgery, please bring cases for these belongings.    For patients admitted to the hospital, discharge time will be determined by your treatment team.   Patients discharged the day of surgery will not be allowed to drive home, and someone needs to stay with them for 24 hours.  SURGICAL WAITING ROOM VISITATION Patients having surgery or a procedure may have no more than 2 support people in the waiting area - these visitors may rotate.   Children under the age of 55 must have an adult with them who is not the patient. If the patient needs to stay at the hospital during part  of their recovery, the visitor guidelines for inpatient rooms apply. Pre-op nurse will coordinate an appropriate time for 1 support person to accompany patient in pre-op.  This support person may not rotate.   Please refer to the [facility] website for the visitor guidelines for Inpatients (after your surgery is over and you are in a regular room).    Special instructions:   [place]- Preparing For Surgery  Before surgery, you can play an important role. Because skin is not sterile, your skin needs to be as free of germs as possible. You can reduce the number of germs on your skin by washing with CHG (chlorahexidine gluconate) Soap before surgery.  CHG is an antiseptic cleaner which kills germs and bonds with the skin to continue killing germs even after washing.    Oral Hygiene is also important to reduce your risk of infection.  Remember - BRUSH YOUR TEETH THE MORNING OF SURGERY WITH YOUR REGULAR TOOTHPASTE   Please follow the instructions on the handout you received at your pre-admission appointment about preparing for your upcoming surgery using the CHG surgical soap. If you have any questions or concerns, please call one of the numbers listed on the first page of this paperwork.    Day of Surgery: Take a shower with CHG soap. Do not wear jewelry or makeup Do not wear lotions, powders, perfumes/colognes, or deodorant. Do not shave 48 hours prior to surgery.  Men may shave face and neck. Do not bring valuables to the hospital.  [facility] is not responsible for any belongings or valuables. Do not wear nail polish, gel polish, artificial nails, or any other type of covering on natural nails (fingers and toes) If you have artificial nails or gel coating that need to be removed by a nail salon, please have this removed prior to surgery. Artificial nails or gel coating may interfere with anesthesia's ability to adequately monitor your vital signs. Wear Clean/Comfortable clothing the  morning of surgery Remember to brush your teeth WITH YOUR REGULAR TOOTHPASTE.   Please read over the following fact sheets that you were given.    If you received a COVID test during your pre-op visit  it is requested that you wear a mask when out in public, stay away from anyone that may not be feeling well and notify your surgeon if you develop symptoms. If you have been in contact with anyone that has tested positive in the last 10 days please notify you surgeon.

## 2022-05-31 ENCOUNTER — Other Ambulatory Visit: Payer: Self-pay

## 2022-05-31 ENCOUNTER — Encounter (HOSPITAL_COMMUNITY): Payer: Self-pay

## 2022-05-31 ENCOUNTER — Encounter (HOSPITAL_COMMUNITY)
Admission: RE | Admit: 2022-05-31 | Discharge: 2022-05-31 | Disposition: A | Discharge status: Home / Self Care | Payer: Medicaid Other | Source: Ambulatory Visit | Attending: Neurological Surgery | Admitting: Neurological Surgery

## 2022-05-31 VITALS — BP 130/86 | Temp 98.3°F | Resp 18 | Ht 64.0 in | Wt 232.7 lb

## 2022-05-31 DIAGNOSIS — E119 Type 2 diabetes mellitus without complications: Secondary | ICD-10-CM | POA: Diagnosis not present

## 2022-05-31 DIAGNOSIS — I517 Cardiomegaly: Secondary | ICD-10-CM | POA: Diagnosis not present

## 2022-05-31 DIAGNOSIS — Z01812 Encounter for preprocedural laboratory examination: Secondary | ICD-10-CM | POA: Insufficient documentation

## 2022-05-31 DIAGNOSIS — Z7985 Long-term (current) use of injectable non-insulin antidiabetic drugs: Secondary | ICD-10-CM | POA: Diagnosis not present

## 2022-05-31 DIAGNOSIS — Z01818 Encounter for other preprocedural examination: Secondary | ICD-10-CM

## 2022-05-31 DIAGNOSIS — M5116 Intervertebral disc disorders with radiculopathy, lumbar region: Secondary | ICD-10-CM | POA: Insufficient documentation

## 2022-05-31 DIAGNOSIS — Z794 Long term (current) use of insulin: Secondary | ICD-10-CM | POA: Insufficient documentation

## 2022-05-31 LAB — SURGICAL PCR SCREEN
MRSA, PCR: NEGATIVE
Staphylococcus aureus: NEGATIVE

## 2022-05-31 LAB — HEMOGLOBIN A1C
Hgb A1c MFr Bld: 11.4 % — ABNORMAL HIGH (ref 4.8–5.6)
Mean Plasma Glucose: 280.48 mg/dL

## 2022-05-31 LAB — BASIC METABOLIC PANEL
Anion gap: 9 (ref 5–15)
BUN: 5 mg/dL — ABNORMAL LOW (ref 6–20)
CO2: 24 mmol/L (ref 22–32)
Calcium: 9 mg/dL (ref 8.9–10.3)
Chloride: 104 mmol/L (ref 98–111)
Creatinine, Ser: 0.59 mg/dL (ref 0.44–1.00)
GFR, Estimated: >60 mL/min (ref >60)
Glucose, Bld: 214 mg/dL — ABNORMAL HIGH (ref 70–99)
Potassium: 3.5 mmol/L (ref 3.5–5.1)
Sodium: 137 mmol/L (ref 135–145)

## 2022-05-31 LAB — CBC
HCT: 39.9 % (ref 36.0–46.0)
Hemoglobin: 13.8 g/dL (ref 12.0–15.0)
MCH: 28.9 pg (ref 26.0–34.0)
MCHC: 34.6 g/dL (ref 30.0–36.0)
MCV: 83.6 fL (ref 80.0–100.0)
Platelets: 270 10*3/uL (ref 150–400)
RBC: 4.77 MIL/uL (ref 3.87–5.11)
RDW: 12.3 % (ref 11.5–15.5)
WBC: 7.1 10*3/uL (ref 4.0–10.5)
nRBC: 0 % (ref 0.0–0.2)

## 2022-05-31 LAB — GLUCOSE, CAPILLARY: Glucose-Capillary: 195 mg/dL — ABNORMAL HIGH (ref 70–99)

## 2022-05-31 NOTE — Progress Notes (Signed)
PCP - Dr. Georganna Skeans Cardiologist - Dr. Olga Millers  PPM/ICD - Denies Device Orders - n/a Rep Notified - n/a  Chest x-ray - n/a EKG - 04/28/2022 Stress Test - Denies ECHO - 05/29/2022 Cardiac Cath - Denies  Sleep Study - Denies CPAP - n/a  Pt is DM2. She checks her blood sugar 2x/day. Normal fasting range is around 100. CBG at pre-op appointment 195. Pt has had a plain bagel and coffee with milk thus far today.  Last dose of GLP1 agonist- Pts last dose of Victoza will be April 28th GLP1 instructions: Pt instructed not to take her Victoza April 29th or April 30th.   Blood Thinner Instructions: n/a Aspirin Instructions: n/a  NPO after midnight  COVID TEST- n/a   Anesthesia review: Yes. Medical Clearance placed in chart. Pt was supposed to have surgery earlier this year, but A1c came back over 11. Surgery was cancelled. She was also established with Dr. Jens Som for syncopal episodes which after cardiac testing was normal, he attributed to her pain. A1c result is pending.   Patient denies shortness of breath, fever, cough and chest pain at PAT appointment. Pt denies any respiratory illness/infection in the last two months.   All instructions explained to the patient, with a verbal understanding of the material. Patient agrees to go over the instructions while at home for a better understanding. Patient also instructed to self quarantine after being tested for COVID-19. The opportunity to ask questions was provided.

## 2022-06-01 NOTE — Anesthesia Preprocedure Evaluation (Addendum)
Anesthesia Evaluation  Patient identified by MRN, date of birth, ID band Patient awake    Reviewed: Allergy & Precautions, NPO status , Patient's Chart, lab work & pertinent test results  Airway Mallampati: III  TM Distance: >3 FB Neck ROM: Full    Dental  (+) Teeth Intact, Dental Advisory Given   Pulmonary asthma    Pulmonary exam normal breath sounds clear to auscultation       Cardiovascular negative cardio ROS Normal cardiovascular exam Rhythm:Regular Rate:Normal     Neuro/Psych  Headaches PSYCHIATRIC DISORDERS Anxiety Depression  Schizophrenia  Intervertebral disc disorder with radiculopathy, Lumbar region    GI/Hepatic negative GI ROS, Neg liver ROS,,,  Endo/Other  diabetes, Poorly Controlled, Type 2, Insulin Dependent  Morbid obesity  Renal/GU negative Renal ROS     Musculoskeletal negative musculoskeletal ROS (+)    Abdominal   Peds  Hematology negative hematology ROS (+)   Anesthesia Other Findings   Reproductive/Obstetrics                              Anesthesia Physical Anesthesia Plan  ASA: 3  Anesthesia Plan: General   Post-op Pain Management: Tylenol PO (pre-op)*   Induction: Intravenous  PONV Risk Score and Plan: 3 and Midazolam, Dexamethasone and Ondansetron  Airway Management Planned: Oral ETT  Additional Equipment:   Intra-op Plan:   Post-operative Plan: Extubation in OR  Informed Consent: I have reviewed the patients History and Physical, chart, labs and discussed the procedure including the risks, benefits and alternatives for the proposed anesthesia with the patient or authorized representative who has indicated his/her understanding and acceptance.     Dental advisory given  Plan Discussed with: CRNA  Anesthesia Plan Comments: (PAT note written 06/01/2022 by Shonna Chock, PA-C. )        Anesthesia Quick Evaluation

## 2022-06-01 NOTE — Progress Notes (Signed)
Anesthesia Chart Review:  Case: [id number] Date/Time: 06/06/22 0715   Procedure: RT L3-4 MIS Laminectomy/Discectomy (Right)   Anesthesia type: General   Pre-op diagnosis: Intervertebral disc disorder with radiculopathy, Lumbar region   Location: [facility] ROOM 21 / [facility]   Surgeons: Jadene Pierini, MD       DISCUSSION: Patient is a 21 old female scheduled for the above procedure.  Surgery was initially planned for 05/08/2022 but delayed until she could have follow-up with her PCP regarding poorly controlled DM.  History includes never smoker, DM2, childhood asthma, dissociative identity disorder, cholecystectomy (07/17/21). BMI is consistent with obesity.    Recent cardiology evaluation by Dr. Jens Som for dizziness/syncopal spells. He noted also significant back pain with acute admission 03/18/22-03/21/22 and reported symptoms at that time. MRI L-spine showed moderate sized caudal disc extrusion at L3-4 with moderate to severe right lateral recess stenosis, query right L4 radiculitis. Neurosurgery consulted and recommended epidural steroid injection by IR initially with out-patient follow-up. PT/OT also evaluated. Syncopal episodes associated with severe pain and felt to be vasovagal in nature, so treatment aimed at getting pain better controlled.  Lidocaine patch, gabapentin, acetaminophen, Toradol, and as needed oxycodone and dilaudid also given. A1c 11.4% and insulin glargine increased to 30 units and Victoza continued. Decadron thought to be exacerbating hyperglycemia, and was discontinued. She also received SSI while hospitalized. Out-patient cardiac monitor also arranged. Dr. Jens Som reviewed and no significant arrhythmia's noted (and she had a syncopal event while wearing the monitor). He thought events were vagally medicated, and asked her to stay hydrated, increased sodium intake, and avoid driving for now. He also ordered an echo. He hoped that by correcting her back pain, her episodes would  improved. He noted that back surgery was scheduled. 05/29/22 echo showed LVEF 60-65%, no regional wall motion abnormalities, mild LHV, normal RVSF, trivial MR.  A1c 11.4%, unchanged from 04/05/22. Surgery was delayed earlier this month due to elevated A1c, but DM felt exacerbated by the steroids she was getting to help with back pain. She was re-evaluated by her PCP Georganna Skeans, MD on 05/10/22. Back pain was worsening. She discussed with a consultant on "how to proceed regarding surgery based on elevated blood glucose and A1c with steroid usage."  She wrote that consultant recommended, "that RBS on day of surgery be 180 or less and that while in hospital it can be maintained with up to and including an insulin drip. Patient should be vigilant in maintaining blood sugars as close to 180 during convalescence in order to improve healing and reduce chance of post op infection." Patient was classified at "Moderate Risk" for surgery. She reported her fasting CBGs are typically ~ 100.  He is currently taking Lantus 25 units nightly, Victoza 1.8 mg daily. Her last Victoza is scheduled for 06/04/22. I also sent a staff message to Dr. Maurice Small and his scheduler Honestee regarding above including most recent A1c results.   She will get a CBG on arrival. Anesthesia team to evaluate on the day of surgery.    VS: BP 130/86   Temp 36.8 C (Oral)   Resp 18   Ht  (1.626 m)   Wt 105.6 kg   SpO2 100%   BMI 39.94 kg/m   PROVIDERS: Georganna Skeans, MD is PCP  Olga Millers, MD is cardiologist   LABS: Preoperative labs noted. A1c 11.4%, unchanged from 04/05/22.  (all labs ordered are listed, but only abnormal results are displayed)  Labs Reviewed  GLUCOSE, CAPILLARY -  Abnormal; Notable for the following components:      Result Value   Glucose-Capillary 195 (*)    All other components within normal limits  HEMOGLOBIN A1C - Abnormal; Notable for the following components:   Hgb A1c MFr Bld 11.4 (*)    All  other components within normal limits  BASIC METABOLIC PANEL - Abnormal; Notable for the following components:   Glucose, Bld 214 (*)    BUN 5 (*)    All other components within normal limits  SURGICAL PCR SCREEN  CBC    IMAGES: MRI L-spine 04/27/22: IMPRESSION: 1. Right subarticular disc protrusion with inferior migration at L3-4, impinging upon the descending right L4 nerve root in the right lateral recess. This is similar but perhaps slightly decreased in size as compared to previous MRI from 03/16/2022. 2. Central to left foraminal disc protrusion at L2-3 with resultant mild left lateral recess stenosis. 3. Central disc protrusion at L5-S1 without significant stenosis or impingement.   CT Abd/pelvis 03/14/22: IMPRESSION: 1. No acute abnormality identified in the abdomen or pelvis.   CT Head 03/14/22: IMPRESSION: Normal head CT.     EKG: 04/26/22: NSR     CV: Echo 05/29/22:  IMPRESSIONS   1. Left ventricular ejection fraction, by estimation, is 60 to 65%. The  left ventricle has normal function. The left ventricle has no regional  wall motion abnormalities. There is mild left ventricular hypertrophy.  Left ventricular diastolic parameters  were normal.   2. Right ventricular systolic function is normal. The right ventricular  size is normal.   3. The mitral valve is normal in structure. Trivial mitral valve  regurgitation.   4. The aortic valve is tricuspid. Aortic valve regurgitation is not  visualized. Aortic valve sclerosis is present, with no evidence of aortic  valve stenosis.    Long term cardiac monitor 03/25/22 - 04/27/22: Normal sinus rhythm, sinus tachycardia.  - No significant arrhythmia.    Past Medical History:  Diagnosis Date   Anxiety    Childhood asthma    Depression    Major Depression disorder   Diabetes mellitus without complication    Dissociative identity disorder    Headache    Migraines   Schizophrenia     Past Surgical History:   Procedure Laterality Date   CHOLECYSTECTOMY N/A 07/17/2021   Procedure: SINGLE SITE LAPAROSCOPIC CHOLECYSTECTOMY WITH INTRAOPERATIVE CHOLANGIOGRAM; LIVER BIOPSY;  Surgeon: Karie Soda, MD;  Location: [facility]S;  Service: General;  Laterality: N/A;   IR EPIDUROGRAPHY  03/21/2022   WISDOM TOOTH EXTRACTION      MEDICATIONS:  Accu-Chek Softclix Lancets lancets   Blood Glucose Monitoring Suppl (ACCU-CHEK GUIDE ME) w/Device KIT   diclofenac Sodium (VOLTAREN) 1 % GEL   fluconazole (DIFLUCAN) 150 MG tablet   glucose blood (ACCU-CHEK GUIDE) test strip   insulin glargine (LANTUS SOLOSTAR) 100 UNIT/ML Solostar Pen   Insulin Pen Needle (B-D UF III MINI PEN NEEDLES) 31G X 5 MM MISC   liraglutide (VICTOZA) 18 MG/3ML SOPN   medroxyPROGESTERone (DEPO-PROVERA) 150 MG/ML injection   methylPREDNISolone (MEDROL DOSEPAK) 4 MG TBPK tablet   nitrofurantoin, macrocrystal-monohydrate, (MACROBID) 100 MG capsule   No current facility-administered medications for this encounter.    Shonna Chock, PA-C Surgical Short Stay/Anesthesiology [facility] Phone [phone] [facility] Phone [phone] 06/01/2022 1:52 PM

## 2022-06-06 ENCOUNTER — Ambulatory Visit (HOSPITAL_BASED_OUTPATIENT_CLINIC_OR_DEPARTMENT_OTHER): Payer: Medicaid Other

## 2022-06-06 ENCOUNTER — Encounter (HOSPITAL_COMMUNITY)
Admission: RE | Disposition: A | Discharge status: Home / Self Care | Payer: Self-pay | Source: Home / Self Care | Attending: Neurological Surgery

## 2022-06-06 ENCOUNTER — Ambulatory Visit (HOSPITAL_COMMUNITY): Payer: Medicaid Other

## 2022-06-06 ENCOUNTER — Ambulatory Visit (HOSPITAL_COMMUNITY): Payer: Medicaid Other | Admitting: Physician Assistant

## 2022-06-06 ENCOUNTER — Encounter (HOSPITAL_COMMUNITY): Payer: Self-pay | Admitting: Neurological Surgery

## 2022-06-06 ENCOUNTER — Other Ambulatory Visit: Payer: Self-pay

## 2022-06-06 ENCOUNTER — Observation Stay (HOSPITAL_COMMUNITY)
Admission: RE | Admit: 2022-06-06 | Discharge: 2022-06-06 | Disposition: A | Discharge status: Home / Self Care | Payer: Medicaid Other | Attending: Neurological Surgery | Admitting: Neurological Surgery

## 2022-06-06 DIAGNOSIS — M5116 Intervertebral disc disorders with radiculopathy, lumbar region: Secondary | ICD-10-CM | POA: Diagnosis present

## 2022-06-06 DIAGNOSIS — E119 Type 2 diabetes mellitus without complications: Secondary | ICD-10-CM | POA: Diagnosis not present

## 2022-06-06 DIAGNOSIS — Z794 Long term (current) use of insulin: Secondary | ICD-10-CM | POA: Diagnosis not present

## 2022-06-06 DIAGNOSIS — E1165 Type 2 diabetes mellitus with hyperglycemia: Secondary | ICD-10-CM

## 2022-06-06 DIAGNOSIS — J45909 Unspecified asthma, uncomplicated: Secondary | ICD-10-CM | POA: Insufficient documentation

## 2022-06-06 DIAGNOSIS — M5416 Radiculopathy, lumbar region: Secondary | ICD-10-CM | POA: Diagnosis present

## 2022-06-06 HISTORY — PX: LUMBAR LAMINECTOMY/ DECOMPRESSION WITH MET-RX: SHX5959

## 2022-06-06 LAB — GLUCOSE, CAPILLARY
Glucose-Capillary: 215 mg/dL — ABNORMAL HIGH (ref 70–99)
Glucose-Capillary: 226 mg/dL — ABNORMAL HIGH (ref 70–99)
Glucose-Capillary: 161 mg/dL — ABNORMAL HIGH (ref 70–99)
Glucose-Capillary: 259 mg/dL — ABNORMAL HIGH (ref 70–99)

## 2022-06-06 LAB — POCT PREGNANCY, URINE: Preg Test, Ur: NEGATIVE

## 2022-06-06 SURGERY — LUMBAR LAMINECTOMY/ DECOMPRESSION WITH MET-RX
Anesthesia: General | Laterality: Right

## 2022-06-06 MED ORDER — CHLORHEXIDINE GLUCONATE 0.12 % MT SOLN
OROMUCOSAL | Status: AC
  Administered 2022-06-06: 15 mL via OROMUCOSAL
  Filled 2022-06-06: qty 15

## 2022-06-06 MED ORDER — INSULIN ASPART 100 UNIT/ML IJ SOLN
0.0000 [IU] | INTRAMUSCULAR | Status: AC | PRN
  Administered 2022-06-06: 6 [IU] via SUBCUTANEOUS

## 2022-06-06 MED ORDER — INSULIN GLARGINE-YFGN 100 UNIT/ML ~~LOC~~ SOLN
25.0000 [IU] | Freq: Every day | SUBCUTANEOUS | Status: DC
  Filled 2022-06-06: qty 0.25

## 2022-06-06 MED ORDER — DOCUSATE SODIUM 100 MG PO CAPS
100.0000 mg | ORAL_CAPSULE | Freq: Two times a day (BID) | ORAL | Status: DC
  Administered 2022-06-06: 100 mg via ORAL
  Filled 2022-06-06: qty 1

## 2022-06-06 MED ORDER — ORAL CARE MOUTH RINSE: 15.0000 mL | Freq: Once | OROMUCOSAL | Status: AC

## 2022-06-06 MED ORDER — CEFAZOLIN SODIUM-DEXTROSE 2-4 GM/100ML-% IV SOLN
2.0000 g | INTRAVENOUS | Status: AC
  Administered 2022-06-06: 2 g via INTRAVENOUS

## 2022-06-06 MED ORDER — INSULIN ASPART 100 UNIT/ML IJ SOLN
0.0000 [IU] | Freq: Three times a day (TID) | INTRAMUSCULAR | Status: DC
  Administered 2022-06-06: 8 [IU] via SUBCUTANEOUS

## 2022-06-06 MED ORDER — FENTANYL CITRATE (PF) 250 MCG/5ML IJ SOLN
INTRAMUSCULAR | Status: DC | PRN
  Administered 2022-06-06: 50 ug via INTRAVENOUS
  Administered 2022-06-06: 25 ug via INTRAVENOUS
  Administered 2022-06-06: 50 ug via INTRAVENOUS

## 2022-06-06 MED ORDER — FENTANYL CITRATE (PF) 100 MCG/2ML IJ SOLN
INTRAMUSCULAR | Status: AC
  Filled 2022-06-06: qty 2

## 2022-06-06 MED ORDER — ACETAMINOPHEN 500 MG PO TABS: 1000.0000 mg | ORAL_TABLET | Freq: Once | ORAL | Status: AC

## 2022-06-06 MED ORDER — INSULIN GLARGINE-YFGN 100 UNIT/ML ~~LOC~~ SOLN
12.0000 [IU] | Freq: Every day | SUBCUTANEOUS | Status: DC
  Filled 2022-06-06: qty 0.12

## 2022-06-06 MED ORDER — PHENYLEPHRINE HCL-NACL 20-0.9 MG/250ML-% IV SOLN
INTRAVENOUS | Status: AC
  Filled 2022-06-06: qty 500

## 2022-06-06 MED ORDER — PROPOFOL 10 MG/ML IV BOLUS
INTRAVENOUS | Status: AC
  Filled 2022-06-06: qty 20

## 2022-06-06 MED ORDER — MIDAZOLAM HCL 2 MG/2ML IJ SOLN
INTRAMUSCULAR | Status: DC | PRN
  Administered 2022-06-06: 2 mg via INTRAVENOUS

## 2022-06-06 MED ORDER — LIDOCAINE 2% (20 MG/ML) 5 ML SYRINGE
INTRAMUSCULAR | Status: DC | PRN
  Administered 2022-06-06: 80 mg via INTRAVENOUS

## 2022-06-06 MED ORDER — THROMBIN 5000 UNITS EX SOLR
OROMUCOSAL | Status: DC | PRN
  Administered 2022-06-06: 5 mL via TOPICAL

## 2022-06-06 MED ORDER — SODIUM CHLORIDE 0.9% FLUSH: 3.0000 mL | INTRAVENOUS | Status: DC | PRN

## 2022-06-06 MED ORDER — PROPOFOL 500 MG/50ML IV EMUL
INTRAVENOUS | Status: DC | PRN
  Administered 2022-06-06: 25 ug/kg/min via INTRAVENOUS

## 2022-06-06 MED ORDER — CEFAZOLIN SODIUM-DEXTROSE 2-4 GM/100ML-% IV SOLN
INTRAVENOUS | Status: AC
  Filled 2022-06-06: qty 100

## 2022-06-06 MED ORDER — INSULIN ASPART 100 UNIT/ML IJ SOLN: 0.0000 [IU] | Freq: Three times a day (TID) | INTRAMUSCULAR | Status: DC

## 2022-06-06 MED ORDER — GLYCOPYRROLATE PF 0.2 MG/ML IJ SOSY
PREFILLED_SYRINGE | INTRAMUSCULAR | Status: AC
  Filled 2022-06-06: qty 1

## 2022-06-06 MED ORDER — PHENYLEPHRINE 80 MCG/ML (10ML) SYRINGE FOR IV PUSH (FOR BLOOD PRESSURE SUPPORT)
PREFILLED_SYRINGE | INTRAVENOUS | Status: DC | PRN
  Administered 2022-06-06: 80 ug via INTRAVENOUS

## 2022-06-06 MED ORDER — ONDANSETRON HCL 4 MG/2ML IJ SOLN
INTRAMUSCULAR | Status: AC
  Filled 2022-06-06: qty 2

## 2022-06-06 MED ORDER — THROMBIN 5000 UNITS EX SOLR
CUTANEOUS | Status: AC
  Filled 2022-06-06: qty 5000

## 2022-06-06 MED ORDER — ACETAMINOPHEN 325 MG PO TABS: 650.0000 mg | ORAL_TABLET | ORAL | Status: DC | PRN

## 2022-06-06 MED ORDER — POLYETHYLENE GLYCOL 3350 17 G PO PACK: 17.0000 g | PACK | Freq: Every day | ORAL | Status: DC | PRN

## 2022-06-06 MED ORDER — SODIUM CHLORIDE 0.9 % IV SOLN
250.0000 mL | INTRAVENOUS | Status: DC
  Administered 2022-06-06: 250 mL via INTRAVENOUS

## 2022-06-06 MED ORDER — OXYCODONE HCL 5 MG PO TABS: 5.0000 mg | ORAL_TABLET | ORAL | 0 refills | Status: DC | PRN

## 2022-06-06 MED ORDER — ACETAMINOPHEN 500 MG PO TABS
ORAL_TABLET | ORAL | Status: AC
  Administered 2022-06-06: 1000 mg via ORAL
  Filled 2022-06-06: qty 2

## 2022-06-06 MED ORDER — DEXMEDETOMIDINE HCL IN NACL 80 MCG/20ML IV SOLN
INTRAVENOUS | Status: DC | PRN
  Administered 2022-06-06 (×2): 4 ug via INTRAVENOUS

## 2022-06-06 MED ORDER — FENTANYL CITRATE (PF) 100 MCG/2ML IJ SOLN
25.0000 ug | INTRAMUSCULAR | Status: DC | PRN
  Administered 2022-06-06 (×2): 50 ug via INTRAVENOUS

## 2022-06-06 MED ORDER — OXYCODONE HCL 5 MG PO TABS
10.0000 mg | ORAL_TABLET | ORAL | Status: DC | PRN
  Administered 2022-06-06: 10 mg via ORAL
  Filled 2022-06-06: qty 2

## 2022-06-06 MED ORDER — ONDANSETRON HCL 4 MG/2ML IJ SOLN
INTRAMUSCULAR | Status: DC | PRN
  Administered 2022-06-06: 4 mg via INTRAVENOUS

## 2022-06-06 MED ORDER — INSULIN ASPART 100 UNIT/ML IJ SOLN: 0.0000 [IU] | Freq: Every day | INTRAMUSCULAR | Status: DC

## 2022-06-06 MED ORDER — LIDOCAINE-EPINEPHRINE 1 %-1:100000 IJ SOLN
INTRAMUSCULAR | Status: DC | PRN
  Administered 2022-06-06: 10 mL via INTRADERMAL

## 2022-06-06 MED ORDER — SUCCINYLCHOLINE CHLORIDE 200 MG/10ML IV SOSY
PREFILLED_SYRINGE | INTRAVENOUS | Status: AC
  Filled 2022-06-06: qty 10

## 2022-06-06 MED ORDER — CYCLOBENZAPRINE HCL 10 MG PO TABS: 10.0000 mg | ORAL_TABLET | Freq: Three times a day (TID) | ORAL | Status: DC | PRN

## 2022-06-06 MED ORDER — CHLORHEXIDINE GLUCONATE 0.12 % MT SOLN: 15.0000 mL | Freq: Once | OROMUCOSAL | Status: AC

## 2022-06-06 MED ORDER — ACETAMINOPHEN 650 MG RE SUPP: 650.0000 mg | RECTAL | Status: DC | PRN

## 2022-06-06 MED ORDER — PHENOL 1.4 % MT LIQD: 1.0000 | OROMUCOSAL | Status: DC | PRN

## 2022-06-06 MED ORDER — MENTHOL 3 MG MT LOZG: 1.0000 | LOZENGE | OROMUCOSAL | Status: DC | PRN

## 2022-06-06 MED ORDER — CHLORHEXIDINE GLUCONATE CLOTH 2 % EX PADS: 6.0000 | MEDICATED_PAD | Freq: Once | CUTANEOUS | Status: DC

## 2022-06-06 MED ORDER — ROCURONIUM BROMIDE 10 MG/ML (PF) SYRINGE
PREFILLED_SYRINGE | INTRAVENOUS | Status: DC | PRN
  Administered 2022-06-06: 60 mg via INTRAVENOUS
  Administered 2022-06-06: 20 mg via INTRAVENOUS

## 2022-06-06 MED ORDER — LIRAGLUTIDE 18 MG/3ML ~~LOC~~ SOPN: 1.8000 mg | PEN_INJECTOR | Freq: Every day | SUBCUTANEOUS | Status: DC

## 2022-06-06 MED ORDER — 0.9 % SODIUM CHLORIDE (POUR BTL) OPTIME
TOPICAL | Status: DC | PRN
  Administered 2022-06-06: 1000 mL

## 2022-06-06 MED ORDER — ROCURONIUM BROMIDE 10 MG/ML (PF) SYRINGE
PREFILLED_SYRINGE | INTRAVENOUS | Status: AC
  Filled 2022-06-06: qty 10

## 2022-06-06 MED ORDER — PROPOFOL 1000 MG/100ML IV EMUL
INTRAVENOUS | Status: AC
  Filled 2022-06-06: qty 100

## 2022-06-06 MED ORDER — LACTATED RINGERS IV SOLN: INTRAVENOUS | Status: DC

## 2022-06-06 MED ORDER — OXYCODONE HCL 5 MG PO TABS: 5.0000 mg | ORAL_TABLET | ORAL | Status: DC | PRN

## 2022-06-06 MED ORDER — CYCLOBENZAPRINE HCL 10 MG PO TABS: 10.0000 mg | ORAL_TABLET | Freq: Three times a day (TID) | ORAL | 0 refills | Status: DC | PRN

## 2022-06-06 MED ORDER — MIDAZOLAM HCL 2 MG/2ML IJ SOLN
INTRAMUSCULAR | Status: AC
  Filled 2022-06-06: qty 2

## 2022-06-06 MED ORDER — ONDANSETRON HCL 4 MG/2ML IJ SOLN: 4.0000 mg | Freq: Four times a day (QID) | INTRAMUSCULAR | Status: DC | PRN

## 2022-06-06 MED ORDER — FENTANYL CITRATE (PF) 250 MCG/5ML IJ SOLN
INTRAMUSCULAR | Status: AC
  Filled 2022-06-06: qty 5

## 2022-06-06 MED ORDER — LIDOCAINE-EPINEPHRINE 1 %-1:100000 IJ SOLN
INTRAMUSCULAR | Status: AC
  Filled 2022-06-06: qty 1

## 2022-06-06 MED ORDER — CEFAZOLIN SODIUM-DEXTROSE 2-4 GM/100ML-% IV SOLN
2.0000 g | Freq: Three times a day (TID) | INTRAVENOUS | Status: DC
  Administered 2022-06-06: 2 g via INTRAVENOUS
  Filled 2022-06-06: qty 100

## 2022-06-06 MED ORDER — SODIUM CHLORIDE 0.9% FLUSH
3.0000 mL | Freq: Two times a day (BID) | INTRAVENOUS | Status: DC
  Administered 2022-06-06: 3 mL via INTRAVENOUS

## 2022-06-06 MED ORDER — DEXAMETHASONE SODIUM PHOSPHATE 10 MG/ML IJ SOLN
INTRAMUSCULAR | Status: AC
  Filled 2022-06-06: qty 1

## 2022-06-06 MED ORDER — LIDOCAINE 2% (20 MG/ML) 5 ML SYRINGE
INTRAMUSCULAR | Status: AC
  Filled 2022-06-06: qty 5

## 2022-06-06 MED ORDER — PROPOFOL 10 MG/ML IV BOLUS
INTRAVENOUS | Status: DC | PRN
  Administered 2022-06-06: 150 mg via INTRAVENOUS

## 2022-06-06 MED ORDER — HYDROMORPHONE HCL 1 MG/ML IJ SOLN: 1.0000 mg | INTRAMUSCULAR | Status: DC | PRN

## 2022-06-06 MED ORDER — INSULIN ASPART 100 UNIT/ML IJ SOLN
INTRAMUSCULAR | Status: AC
  Administered 2022-06-06: 4 [IU] via SUBCUTANEOUS
  Filled 2022-06-06: qty 1

## 2022-06-06 MED ORDER — ONDANSETRON HCL 4 MG PO TABS: 4.0000 mg | ORAL_TABLET | Freq: Four times a day (QID) | ORAL | Status: DC | PRN

## 2022-06-06 MED ORDER — SUGAMMADEX SODIUM 200 MG/2ML IV SOLN
INTRAVENOUS | Status: DC | PRN
  Administered 2022-06-06: 300 mg via INTRAVENOUS

## 2022-06-06 MED ORDER — PROMETHAZINE HCL 25 MG/ML IJ SOLN: 6.2500 mg | INTRAMUSCULAR | Status: DC | PRN

## 2022-06-06 SURGICAL SUPPLY — 53 items
ADH SKN CLS APL DERMABOND .7 (GAUZE/BANDAGES/DRESSINGS) ×1
BAG COUNTER SPONGE SURGICOUNT (BAG) ×2 IMPLANT
BAG SPNG CNTER NS LX DISP (BAG) ×2
BAND INSRT 18 STRL LF DISP RB (MISCELLANEOUS) ×2
BAND RUBBER #18 3X1/16 STRL (MISCELLANEOUS) ×4 IMPLANT
BLADE CLIPPER SURG (BLADE) IMPLANT
BLADE SURG 11 STRL SS (BLADE) ×2 IMPLANT
BUR SURG IBUR 4X12.5 (BURR) IMPLANT
BUR SURGICAL HEAD PROX 3X12.5 (BURR) ×2 IMPLANT
BURR SURG IBUR 4X12.5 (BURR)
BURR SURGICAL HEAD PROX 3X12.5 (BURR) ×1
CANISTER SUCT 3000ML PPV (MISCELLANEOUS) ×2 IMPLANT
DERMABOND ADVANCED .7 DNX12 (GAUZE/BANDAGES/DRESSINGS) ×2 IMPLANT
DRAPE C-ARM 42X72 X-RAY (DRAPES) ×4 IMPLANT
DRAPE LAPAROTOMY 100X72X124 (DRAPES) ×2 IMPLANT
DRAPE MICROSCOPE SLANT 54X150 (MISCELLANEOUS) ×2 IMPLANT
DRAPE SURG 17X23 STRL (DRAPES) ×2 IMPLANT
DURAPREP 26ML APPLICATOR (WOUND CARE) ×2 IMPLANT
ELECT BLADE 6.5 EXT (BLADE) ×2 IMPLANT
ELECT REM PT RETURN 9FT ADLT (ELECTROSURGICAL) ×1
ELECTRODE REM PT RTRN 9FT ADLT (ELECTROSURGICAL) ×2 IMPLANT
GAUZE 4X4 16PLY ~~LOC~~+RFID DBL (SPONGE) IMPLANT
GAUZE SPONGE 4X4 12PLY STRL (GAUZE/BANDAGES/DRESSINGS) IMPLANT
GLOVE BIOGEL PI IND STRL 7.5 (GLOVE) ×2 IMPLANT
GLOVE ECLIPSE 7.5 STRL STRAW (GLOVE) ×2 IMPLANT
GLOVE EXAM NITRILE LRG STRL (GLOVE) IMPLANT
GLOVE EXAM NITRILE XL STR (GLOVE) IMPLANT
GLOVE EXAM NITRILE XS STR PU (GLOVE) IMPLANT
GOWN STRL REUS W/ TWL LRG LVL3 (GOWN DISPOSABLE) ×4 IMPLANT
GOWN STRL REUS W/ TWL XL LVL3 (GOWN DISPOSABLE) IMPLANT
GOWN STRL REUS W/TWL 2XL LVL3 (GOWN DISPOSABLE) IMPLANT
GOWN STRL REUS W/TWL LRG LVL3 (GOWN DISPOSABLE) ×4
GOWN STRL REUS W/TWL XL LVL3 (GOWN DISPOSABLE)
HEMOSTAT POWDER KIT SURGIFOAM (HEMOSTASIS) ×2 IMPLANT
KIT BASIN OR (CUSTOM PROCEDURE TRAY) ×2 IMPLANT
KIT TURNOVER KIT B (KITS) ×2 IMPLANT
NDL SPNL 18GX3.5 QUINCKE PK (NEEDLE) ×2 IMPLANT
NEEDLE HYPO 22GX1.5 SAFETY (NEEDLE) ×2 IMPLANT
NEEDLE SPNL 18GX3.5 QUINCKE PK (NEEDLE) ×1 IMPLANT
NS IRRIG 1000ML POUR BTL (IV SOLUTION) ×2 IMPLANT
PACK LAMINECTOMY NEURO (CUSTOM PROCEDURE TRAY) ×2 IMPLANT
PAD ARMBOARD 7.5X6 YLW CONV (MISCELLANEOUS) ×6 IMPLANT
SOL ELECTROSURG ANTI STICK (MISCELLANEOUS)
SOLUTION ELECTROSURG ANTI STCK (MISCELLANEOUS) ×2 IMPLANT
SPIKE FLUID TRANSFER (MISCELLANEOUS) ×2 IMPLANT
SPONGE T-LAP 4X18 ~~LOC~~+RFID (SPONGE) IMPLANT
SUT MNCRL AB 3-0 PS2 18 (SUTURE) IMPLANT
SUT VIC AB 0 CT1 18XCR BRD8 (SUTURE) IMPLANT
SUT VIC AB 0 CT1 8-18 (SUTURE)
SUT VIC AB 2-0 CP2 18 (SUTURE) ×2 IMPLANT
TOWEL GREEN STERILE (TOWEL DISPOSABLE) ×2 IMPLANT
TOWEL GREEN STERILE FF (TOWEL DISPOSABLE) ×2 IMPLANT
WATER STERILE IRR 1000ML POUR (IV SOLUTION) ×2 IMPLANT

## 2022-06-06 NOTE — Plan of Care (Signed)
  Problem: Education: Goal: Ability to verbalize activity precautions or restrictions will improve Outcome: Completed/Met Goal: Knowledge of the prescribed therapeutic regimen will improve Outcome: Completed/Met Goal: Understanding of discharge needs will improve Outcome: Completed/Met   Problem: Activity: Goal: Ability to avoid complications of mobility impairment will improve Outcome: Completed/Met Goal: Ability to tolerate increased activity will improve Outcome: Completed/Met Goal: Will remain free from falls Outcome: Completed/Met   Problem: Bowel/Gastric: Goal: Gastrointestinal status for postoperative course will improve Outcome: Completed/Met   Problem: Clinical Measurements: Goal: Ability to maintain clinical measurements within normal limits will improve Outcome: Completed/Met Goal: Postoperative complications will be avoided or minimized Outcome: Completed/Met Goal: Diagnostic test results will improve Outcome: Completed/Met   Problem: Pain Management: Goal: Pain level will decrease Outcome: Completed/Met   Problem: Skin Integrity: Goal: Will show signs of wound healing Outcome: Completed/Met   Problem: Health Behavior/Discharge Planning: Goal: Identification of resources available to assist in meeting health care needs will improve Outcome: Completed/Met   Problem: Bladder/Genitourinary: Goal: Urinary functional status for postoperative course will improve Outcome: Completed/Met   Problem: Health Behavior/Discharge Planning: Goal: Ability to manage health-related needs will improve Outcome: Completed/Met

## 2022-06-06 NOTE — Evaluation (Signed)
Occupational Therapy Evaluation Patient Details Name: Melissa Gilmore MRN: [id number] DOB: May 02, 1995 Today's Date: 06/06/2022   History of Present Illness Pt is a 27 y/o F presenting to ED on 2/10 with low back pain, abdominal pain, and dizziness x3 days. Near syncopal episodes in ED. MRI of lumbar spine revealing disc degeneration at L2-3, L3-4. PMH includes depression, DM, dissociative identity disorder, and schizophrenia   Clinical Impression   Patient admitted for the procedure above.  PTA she was independent with ADL completion, but did have difficulties with walking longer distances due to the pain.  Currently she walking the halls with Mod I, and subjectively says her pain is much better.  Patient able to complete ADL with no assist.  Precautions reviewed, and patient verbalizes understanding.  Patient will have assist as needed from SO and family.  No further OT needs in the acute setting.        Recommendations for follow up therapy are one component of a multi-disciplinary discharge planning process, led by the attending physician.  Recommendations may be updated based on patient status, additional functional criteria and insurance authorization.   Assistance Recommended at Discharge Set up Supervision/Assistance  Patient can return home with the following Assist for transportation    Functional Status Assessment  Patient has had a recent decline in their functional status and demonstrates the ability to make significant improvements in function in a reasonable and predictable amount of time.  Equipment Recommendations  None recommended by OT    Recommendations for Other Services       Precautions / Restrictions Precautions Precautions: Fall Restrictions Weight Bearing Restrictions: No      Mobility Bed Mobility Overal bed mobility: Modified Independent                  Transfers Overall transfer level: Modified independent                         Balance Overall balance assessment: Mild deficits observed, not formally tested                                         ADL either performed or assessed with clinical judgement   ADL Overall ADL's : At baseline                                             Vision Patient Visual Report: No change from baseline       Perception     Praxis      Pertinent Vitals/Pain Pain Assessment Pain Assessment: Faces Faces Pain Scale: Hurts a little bit Pain Location: Incisional Pain Descriptors / Indicators: Tender Pain Intervention(s): Monitored during session     Hand Dominance Right   Extremity/Trunk Assessment Upper Extremity Assessment Upper Extremity Assessment: Overall WFL for tasks assessed   Lower Extremity Assessment Lower Extremity Assessment: Overall WFL for tasks assessed   Cervical / Trunk Assessment Cervical / Trunk Assessment: Normal   Communication Communication Communication: No difficulties   Cognition Arousal/Alertness: Awake/alert Behavior During Therapy: WFL for tasks assessed/performed Overall Cognitive Status: Within Functional Limits for tasks assessed  General Comments   VSS on RA    Exercises     Shoulder Instructions      Home Living Family/patient expects to be discharged to:: Private residence Living Arrangements: Parent;Spouse/significant other;Other relatives Available Help at Discharge: Family;Available 24 hours/day Type of Home: House Home Access: Stairs to enter [employer] of Steps: 1   Home Layout: One level     Bathroom Shower/Tub: Producer, television/film/video: Standard Bathroom Accessibility: Yes How Accessible: Accessible via walker Home Equipment: Rollator (4 wheels)          Prior Functioning/Environment               Mobility Comments: used Rollator ADLs Comments: sister assisted with ADLs         OT Problem List: Pain      OT Treatment/Interventions:      OT Goals(Current goals can be found in the care plan section) Acute Rehab OT Goals Patient Stated Goal: Return home OT Goal Formulation: With patient Time For Goal Achievement: 06/08/22 Potential to Achieve Goals: Good  OT Frequency:      Co-evaluation              AM-PAC OT "6 Clicks" Daily Activity     Outcome Measure Help from another person eating meals?: None Help from another person taking care of personal grooming?: None Help from another person toileting, which includes using toliet, bedpan, or urinal?: None Help from another person bathing (including washing, rinsing, drying)?: None Help from another person to put on and taking off regular upper body clothing?: None Help from another person to put on and taking off regular lower body clothing?: A Little 6 Click Score: 23   End of Session Equipment Utilized During Treatment: Gait belt Nurse Communication: Mobility status  Activity Tolerance: Patient tolerated treatment well Patient left: in bed;with call bell/phone within reach;with family/visitor present  OT Visit Diagnosis: Unsteadiness on feet (R26.81)                Time: 1610-9604 OT Time Calculation (min): 17 min Charges:  OT General Charges $OT Visit: 1 Visit OT Evaluation $OT Eval Moderate Complexity: 1 Mod  06/06/2022  RP, OTR/L  Acute Rehabilitation Services  Office:  [phone]   Suzanna Obey 06/06/2022, 5:15 PM

## 2022-06-06 NOTE — Anesthesia Procedure Notes (Addendum)
Procedure Name: Intubation Date/Time: 06/06/2022 7:42 AM  Performed by: Carlos American, CRNAPre-anesthesia Checklist: Patient identified, Emergency Drugs available, Suction available and Patient being monitored Patient Re-evaluated:Patient Re-evaluated prior to induction Oxygen Delivery Method: Circle System Utilized Preoxygenation: Pre-oxygenation with 100% oxygen Induction Type: IV induction Ventilation: Mask ventilation without difficulty and Oral airway inserted - appropriate to patient size Laryngoscope Size: Mac and 3 Grade View: Grade I Tube type: Oral Tube size: 7.0 mm Number of attempts: 1 Airway Equipment and Method: Stylet and Oral airway Placement Confirmation: ETT inserted through vocal cords under direct vision, positive ETCO2 and breath sounds checked- equal and bilateral Secured at: 22 cm Tube secured with: Tape Dental Injury: Teeth and Oropharynx as per pre-operative assessment  Comments: Lauren Cozart, SRNA placed ETT under direct supervision. CRNA and MDA at bedside. Atraumatic intubation.

## 2022-06-06 NOTE — Inpatient Diabetes Management (Signed)
Inpatient Diabetes Program Recommendations  AACE/ADA: New Consensus Statement on Inpatient Glycemic Control (2015)  Target Ranges:  Prepandial:   less than 140 mg/dL      Peak postprandial:   less than 180 mg/dL (1-2 hours)      Critically ill patients:  140 - 180 mg/dL   Lab Results  Component Value Date   GLUCAP 161 (H) 06/06/2022   HGBA1C 11.4 (H) 05/31/2022    Review of Glycemic Control  Dia  Latest Reference Range & Units 06/06/22 05:51 06/06/22 08:10 06/06/22 09:47  Glucose-Capillary 70 - 99 mg/dL 161 (H) 096 (H) 045 (H)  (H): Data is abnormally high  Diabetes history: DM2 Outpatient Diabetes medications: Lantus 25 units QHS, Victoza 1.8 mg QD Current orders for Inpatient glycemic control: Semglee 25 units QHS, Novolog 0-15 units TID  Inpatient Diabetes Program Recommendations:    Please consider:  Semglee 12 units QHS (50% of home dose) can titrate up as needed Novolog 0-5 units QHS  Will speak with her regarding A1C-Has been on steroids at home.    Will continue to follow while inpatient.  Thank you, Dulce Sellar, MSN, CDCES Diabetes Coordinator Inpatient Diabetes Program [phone] (team pager from 8a-5p)

## 2022-06-06 NOTE — Transfer of Care (Signed)
Immediate Anesthesia Transfer of Care Note  Patient: Melissa Gilmore [facility]  Procedure(s) Performed: Right Lumbar three-four Minimally Invasive Surgery Laminectomy/Discectomy (Right)  Patient Location: PACU  Anesthesia Type:General  Level of Consciousness: drowsy and patient cooperative  Airway & Oxygen Therapy: Patient Spontanous Breathing  Post-op Assessment: Report given to RN and Post -op Vital signs reviewed and stable  Post vital signs: Reviewed and stable  Last Vitals:  Vitals Value Taken Time  BP 133/98   Temp    Pulse 94   Resp 14   SpO2 98     Last Pain:  Vitals:   06/06/22 0612  TempSrc:   PainSc: 10-Worst pain ever      Patients Stated Pain Goal: 6 (06/06/22 0612)  Complications: No notable events documented.

## 2022-06-06 NOTE — H&P (Signed)
Surgical H&P Update  HPI: 27 y.o. with a history of bilateral lower extremity pain. Workup showed an L3-4 disc herniation. No significant changes in health since they were last seen, A1c was elevated but fasting Bgs are reportedly typically 100. She stopped steroids and these improved. We rescheduled but she is miserable and wants to proceed. Given her co-morbidities including the hyperglycemia, we discussed an increased risk of perioperative complications.  PMHx:  Past Medical History:  Diagnosis Date   Anxiety    Childhood asthma    Depression    Major Depression disorder   Diabetes mellitus without complication (HCC)    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HCC)    Headache    Migraines   Schizophrenia (HCC)    FamHx:  Family History  Problem Relation Age of Onset   Diabetes Maternal Grandmother    Stomach cancer Maternal Grandmother    SocHx:  reports that she has never smoked. She has never used smokeless tobacco. She reports current alcohol use. She reports that she does not currently use drugs.  Physical Exam: Strength 5/5 x4 and SILTx4   Assesment/Plan: 27 y.o. woman with L3-4 HNP and radiculopathy, here for L3-4 MIS discectomy. Risks, benefits, and alternatives discussed and the patient would like to continue with surgery.  -OR today -3C post-op  Jadene Pierini, MD 06/06/22 7:21 AM

## 2022-06-06 NOTE — Progress Notes (Signed)
Patient alert and oriented, voiding adequately, MAE well with no difficulty. Incision area cdi with no s/s of infection. Patient discharged home per order. Patient and Boyfriend stated understanding of discharge instructions given. Patient has an appointment with Dr.Ostergard

## 2022-06-06 NOTE — Discharge Summary (Signed)
Discharge Summary  Date of Admission: 06/06/2022  Date of Discharge: 06/06/22  Attending Physician: Autumn Patty, MD  Hospital Course: Patient was admitted following an uncomplicated right L3-4 MIS discectomy. They were recovered in PACU and transferred to [facility]. Their preop symptoms were improved, their hospital course was uncomplicated and the patient was discharged home today. They will follow up in clinic with me in clinic in 2 weeks.  Discharge diagnosis: Herniated nucleus pulposus with lumbar radiculopathy   Iran Sizer, PA-C 06/06/22 4:48 PM

## 2022-06-06 NOTE — Op Note (Signed)
PATIENT: Melissa Gilmore [facility]  DAY OF SURGERY: 06/06/22   PRE-OPERATIVE DIAGNOSIS:  Herniated nucleus pulposus with lumbar radiculopathy   POST-OPERATIVE DIAGNOSIS:  Herniated nucleus pulposus with lumbar radiculopathy   PROCEDURE:  Right minimally invasive L3-4 discectomy, use of operating room microscope   SURGEON:  Surgeon(s) and Role:    Jadene Pierini, MD    Emilee Hero PA   ANESTHESIA: ETGA   BRIEF HISTORY: This is a 27 year old woman who presented with right greater than left radicular pain, MRI showed a corresponding disc herniation and and nerve root compression at L3-4. I therefore recommended a minimally invasive L3-4 discectomy. This was discussed with the patient as well as risks, benefits, and alternatives and the patient wished to proceed with surgical treatment.    OPERATIVE DETAIL: The patient was taken to the operating room and placed on the OR table in the prone position. A formal time out was performed with two patient identifiers and confirmed the operative site. Anesthesia was induced by the anesthesia team. The operative site was marked, hair was clipped with surgical clippers, the area was then prepped and draped in a sterile fashion. Fluoroscopy was used to identify the surgical level prior to incision.   A 2cm incision was then marked 1cm off to the right of midline. The fascia was incised sharply and serial dilators were docked to the lamino-facet junction using fluoroscopy to confirm position as well as perform a second count to confirm the correct surgical level. After a final dilator was placed, a tubular retractor was placed over this and secured to the table. The operating microscope was draped and brought into the field. Anatomy was palpated and confirmed, monopolar cautery was used to expose the facet, lamina, and a portion of the spinous process to confirm orientation. A high speed drill and kerrison rongeurs were then used to create a  hemilaminotomy and small medial facetectomy. The ligamentum flavum was resected and the thecal sac and traversing nerve root were identified. The traversing nerve root was gently retracted medially to expose the disc space using a suction-retractor. A disc herniation was clearly present. An annulotomy was created sharply and large, free disc fragments that were easily removed. The annulotomy was explored and additional free fragments were removed. The traversing nerve root was palpated throughout its visible course to confirm good decompression. The medial aspect of the neuro-foramen was also probed with a right angle ball-tip probe to confirm decompression of the exiting nerve root.   Alli Consentino PA was scrubbed and assisted with the entire procedure which included exposure, discectomy, and closure.  Hemostasis was obtained, the wound was copiously irrigated, and the tube was removed while using the microscope to confirm hemostasis of the muscle edges. All instrument and sponge counts were correct and the incision was then closed in layers. The patient was then returned to anesthesia for emergence. No apparent complications at the completion of the procedure.    EBL:  30mL   DRAINS: none   SPECIMENS: none   Jadene Pierini, MD

## 2022-06-07 ENCOUNTER — Telehealth: Payer: Self-pay

## 2022-06-07 ENCOUNTER — Encounter (HOSPITAL_COMMUNITY): Payer: Self-pay | Admitting: Neurological Surgery

## 2022-06-07 NOTE — Anesthesia Postprocedure Evaluation (Signed)
Anesthesia Post Note  Patient: Melissa Gilmore [facility]  Procedure(s) Performed: Right Lumbar three-four Minimally Invasive Surgery Laminectomy/Discectomy (Right)     Patient location during evaluation: PACU Anesthesia Type: General Level of consciousness: awake and alert Pain management: pain level controlled Vital Signs Assessment: post-procedure vital signs reviewed and stable Respiratory status: spontaneous breathing, nonlabored ventilation, respiratory function stable and patient connected to nasal cannula oxygen Cardiovascular status: blood pressure returned to baseline and stable Postop Assessment: no apparent nausea or vomiting Anesthetic complications: no   No notable events documented.  Last Vitals:  Vitals:   06/06/22 1145 06/06/22 1727  BP: (!) 128/90 (!) 144/96  Pulse: 91 87  Resp: 18 16  Temp: 36.7 C 36.8 C  SpO2: 100% 100%    Last Pain:  Vitals:   06/06/22 1727  TempSrc: Oral  PainSc:                  Kennieth Rad

## 2022-06-07 NOTE — Transitions of Care (Post Inpatient/ED Visit) (Signed)
06/07/2022  Name: Melissa Gilmore MRN: [id number] DOB: 08-23-1995  Today's TOC FU Call Status: Today's TOC FU Call Status:: Successful TOC FU Call Competed TOC FU Call Complete Date: 06/07/22  Transition Care Management Follow-up Telephone Call Date of Discharge: 06/06/22 Discharge Facility: [facility]) Type of Discharge: Inpatient Admission Primary Inpatient Discharge Diagnosis:: Lumbar Radiculopathy Reason for ED Visit: Other: How have you been since you were released from the hospital?: Worse Any questions or concerns?: Yes Patient Questions/Concerns:: have numbness on right side she said that she has call the [facility] Patient Questions/Concerns Addressed: Notified Provider of Patient Questions/Concerns  Items Reviewed: Did you receive and understand the discharge instructions provided?: Yes Medications obtained,verified, and reconciled?: Yes (Medications Reviewed) Any new allergies since your discharge?: Yes Dietary orders reviewed?: No Do you have support at home?: Yes People in Home: parent(s)  Medications Reviewed Today: Medications Reviewed Today     Reviewed by Annabell Sabal, CMA (Certified Medical Assistant) on 06/07/22 at 1050  Med List Status: <None>   Medication Order Taking? Sig Documenting Provider Last Dose Status Informant  Accu-Chek Softclix Lancets lancets [id number] Yes Use to check blood sugar twice daily. Dx E11.65 Georganna Skeans, MD Taking Active Self  Blood Glucose Monitoring Suppl (ACCU-CHEK GUIDE ME) w/Device KIT [id number] Yes Use to check blood sugar twice daily. Dx E11.65 Georganna Skeans, MD Taking Active Self  cyclobenzaprine (FLEXERIL) 10 MG tablet [id number] Yes Take 1 tablet (10 mg total) by mouth 3 (three) times daily as needed for muscle spasms. [place] Sizer, PA-C Taking Active   glucose blood (ACCU-CHEK GUIDE) test strip [id number] Yes Use to check blood sugar twice daily. Dx E11.65 Georganna Skeans, MD Taking Active Self   insulin glargine (LANTUS SOLOSTAR) 100 UNIT/ML Solostar Pen [id number] Yes Inject 20 Units into the skin at bedtime.  Patient taking differently: Inject 25 Units into the skin at bedtime.   Georganna Skeans, MD Taking Active Self  Insulin Pen Needle (B-D UF III MINI PEN NEEDLES) 31G X 5 MM MISC [id number] Yes Use as instructed. Inject into the skin once daily Georganna Skeans, MD Taking Active Self  liraglutide (VICTOZA) 18 MG/3ML SOPN [id number] Yes Inject 1.8 mg into the skin daily. Tilden Fossa, MD Taking Active Self  medroxyPROGESTERone (DEPO-PROVERA) 150 MG/ML injection [id number] Yes Inject 150 mg into the muscle every 3 (three) months. [provider] Taking Active Self           Med Note Ricke Hey   JOA Mar 18, 2022  6:44 AM) Next dose was schedule for 2.6.24. Missed appt.  oxyCODONE (OXY IR/ROXICODONE) 5 MG immediate release tablet [id number] Yes Take 1 tablet (5 mg total) by mouth every 4 (four) hours as needed for severe pain. Iran Sizer, PA-C Taking Active             Home Care and Equipment/Supplies: Were Home Health Services Ordered?: No Any new equipment or medical supplies ordered?: No  Functional Questionnaire: Do you need assistance with bathing/showering or dressing?: No Do you need assistance with meal preparation?: No Do you need assistance with eating?: No Do you have difficulty maintaining continence: No Do you need assistance with getting out of bed/getting out of a chair/moving?: No Do you have difficulty managing or taking your medications?: No  Follow up appointments reviewed: PCP Follow-up appointment confirmed?: Yes Date of PCP follow-up appointment?: 06/29/22 (pt didn't want to change her appt.) Follow-up Provider: [facility] Follow-up appointment confirmed?: No Reason Specialist Follow-Up Not  Confirmed: Patient has Specialist Provider Number and will Call for Appointment (pt is waiting on the office to call her  back) Do you need transportation to your follow-up appointment?: No Do you understand care options if your condition(s) worsen?: Yes-patient verbalized understanding    SIGNATURE Fredirick Maudlin [facility]

## 2022-06-15 LAB — HM DIABETES EYE EXAM

## 2022-06-29 ENCOUNTER — Ambulatory Visit (INDEPENDENT_AMBULATORY_CARE_PROVIDER_SITE_OTHER): Payer: Medicaid Other | Admitting: Family Medicine

## 2022-06-29 ENCOUNTER — Encounter: Payer: Self-pay | Admitting: Family Medicine

## 2022-06-29 VITALS — BP 124/86 | HR 101 | Temp 98.1°F | Resp 16 | Wt 232.0 lb

## 2022-06-29 DIAGNOSIS — E1165 Type 2 diabetes mellitus with hyperglycemia: Secondary | ICD-10-CM | POA: Diagnosis not present

## 2022-06-29 DIAGNOSIS — M5441 Lumbago with sciatica, right side: Secondary | ICD-10-CM

## 2022-06-29 DIAGNOSIS — M5442 Lumbago with sciatica, left side: Secondary | ICD-10-CM | POA: Diagnosis not present

## 2022-06-29 DIAGNOSIS — G8929 Other chronic pain: Secondary | ICD-10-CM

## 2022-06-29 DIAGNOSIS — Z794 Long term (current) use of insulin: Secondary | ICD-10-CM | POA: Diagnosis not present

## 2022-06-29 NOTE — Progress Notes (Signed)
Follow up post back sugery .

## 2022-07-04 ENCOUNTER — Encounter: Payer: Self-pay | Admitting: Family Medicine

## 2022-07-04 NOTE — Progress Notes (Signed)
Established Patient Office Visit  Subjective    Patient ID: Melissa Gilmore, female    DOB: Nov 10, 1995  Age: 27 y.o. MRN: [id number]  CC:  Chief Complaint  Patient presents with   Follow-up    HFU    HPI Melissa Gilmore presents for follow up of chronic med issues. Patient reports that her recent back surgery was successful and she is doing much better.    Outpatient Encounter Medications as of 06/29/2022  Medication Sig   Accu-Chek Softclix Lancets lancets Use to check blood sugar twice daily. Dx E11.65   Blood Glucose Monitoring Suppl (ACCU-CHEK GUIDE ME) w/Device KIT Use to check blood sugar twice daily. Dx E11.65   cyclobenzaprine (FLEXERIL) 10 MG tablet Take 1 tablet (10 mg total) by mouth 3 (three) times daily as needed for muscle spasms.   glucose blood (ACCU-CHEK GUIDE) test strip Use to check blood sugar twice daily. Dx E11.65   insulin glargine (LANTUS SOLOSTAR) 100 UNIT/ML Solostar Pen Inject 20 Units into the skin at bedtime. (Patient taking differently: Inject 25 Units into the skin at bedtime.)   Insulin Pen Needle (B-D UF III MINI PEN NEEDLES) 31G X 5 MM MISC Use as instructed. Inject into the skin once daily   liraglutide (VICTOZA) 18 MG/3ML SOPN Inject 1.8 mg into the skin daily.   medroxyPROGESTERone (DEPO-PROVERA) 150 MG/ML injection Inject 150 mg into the muscle every 3 (three) months.   oxyCODONE (OXY IR/ROXICODONE) 5 MG immediate release tablet Take 1 tablet (5 mg total) by mouth every 4 (four) hours as needed for severe pain.   No facility-administered encounter medications on file as of 06/29/2022.    Past Medical History:  Diagnosis Date   Anxiety    Childhood asthma    Depression    Major Depression disorder   Diabetes mellitus without complication (HCC)    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HCC)    Headache    Migraines   Schizophrenia (HCC)     Past Surgical History:  Procedure Laterality Date   CHOLECYSTECTOMY N/A  07/17/2021   Procedure: SINGLE SITE LAPAROSCOPIC CHOLECYSTECTOMY WITH INTRAOPERATIVE CHOLANGIOGRAM; LIVER BIOPSY;  Surgeon: Melissa Soda, MD;  Location: [facility];  Service: General;  Laterality: N/A;   IR EPIDUROGRAPHY  03/21/2022   LUMBAR LAMINECTOMY/ DECOMPRESSION WITH MET-RX Right 06/06/2022   Procedure: Right Lumbar three-four Minimally Invasive Surgery Laminectomy/Discectomy;  Surgeon: Melissa Pierini, MD;  Location[facility];  Service: Neurosurgery;  Laterality: Right;   WISDOM TOOTH EXTRACTION      Family History  Problem Relation Age of Onset   Diabetes Maternal Grandmother    Stomach cancer Maternal Grandmother     Social History   Socioeconomic History   Marital status: Single    Spouse name: Not on file   Number of children: Not on file   Years of education: Not on file   Highest education level: Not on file  Occupational History   Not on file  Tobacco Use   Smoking status: Never   Smokeless tobacco: Never  Vaping Use   Vaping Use: Never used  Substance and Sexual Activity   Alcohol use: Yes    Comment: Occasional- events   Drug use: Not Currently   Sexual activity: Not Currently  Other Topics Concern   Not on file  Social History Narrative   Not on file   Social Determinants of Health   Financial Resource Strain: High Risk (03/16/2022)   Overall Financial Resource Strain (CARDIA)  Difficulty of Paying Living Expenses: Hard  Food Insecurity: Food Insecurity Present (03/18/2022)   Hunger Vital Sign    Worried About Running Out of Food in the Last Year: Often true    Ran Out of Food in the Last Year: Often true  Transportation Needs: No Transportation Needs (03/18/2022)   PRAPARE - Administrator, Civil Service (Medical): No    Lack of Transportation (Non-Medical): No  Physical Activity: Not on file  Stress: Stress Concern Present (03/16/2022)   [employer] of Occupational Health - Occupational Stress Questionnaire    Feeling of Stress : To  some extent  Social Connections: Moderately Isolated (03/16/2022)   Social Connection and Isolation Panel [NHANES]    Frequency of Communication with Friends and Family: More than three times a week    Frequency of Social Gatherings with Friends and Family: More than three times a week    Attends Religious Services: Never    Database administrator or Organizations: No    Attends Banker Meetings: Never    Marital Status: Living with partner  Intimate Partner Violence: Not At Risk (03/18/2022)   Humiliation, Afraid, Rape, and Kick questionnaire    Fear of Current or Ex-Partner: No    Emotionally Abused: No    Physically Abused: No    Sexually Abused: No    Review of Systems  All other systems reviewed and are negative.       Objective    BP 124/86   Pulse (!) 101   Temp 98.1 F (36.7 C) (Oral)   Resp 16   Wt 232 lb (105.2 kg)   SpO2 97%   BMI 39.82 kg/m   Physical Exam Vitals and nursing note reviewed.  Constitutional:      General: She is not in acute distress. Cardiovascular:     Rate and Rhythm: Normal rate and regular rhythm.  Pulmonary:     Effort: Pulmonary effort is normal.     Breath sounds: Normal breath sounds.  Abdominal:     Palpations: Abdomen is soft.     Tenderness: There is no abdominal tenderness.  Neurological:     General: No focal deficit present.     Mental Status: She is alert and oriented to person, place, and time.         Assessment & Plan:   1. Type 2 diabetes mellitus with hyperglycemia, with long-term current use of insulin (HCC) Elevated A1c. Will monitor. Has had numerous courses of steroids to help manage back pain.   2. Chronic midline low back pain with bilateral sciatica Much improved with recent surgery. Management as per consultant.      Return in about 3 months (around 09/29/2022) for follow up.   Melissa Raymond, MD

## 2022-07-05 ENCOUNTER — Encounter (HOSPITAL_COMMUNITY): Payer: Self-pay

## 2022-07-05 ENCOUNTER — Ambulatory Visit (HOSPITAL_COMMUNITY)
Admission: EM | Admit: 2022-07-05 | Discharge: 2022-07-05 | Disposition: A | Discharge status: Home / Self Care | Payer: Medicaid Other | Attending: Emergency Medicine | Admitting: Emergency Medicine

## 2022-07-05 DIAGNOSIS — Z3202 Encounter for pregnancy test, result negative: Secondary | ICD-10-CM | POA: Diagnosis not present

## 2022-07-05 DIAGNOSIS — R109 Unspecified abdominal pain: Secondary | ICD-10-CM | POA: Diagnosis not present

## 2022-07-05 DIAGNOSIS — R110 Nausea: Secondary | ICD-10-CM

## 2022-07-05 DIAGNOSIS — N644 Mastodynia: Secondary | ICD-10-CM

## 2022-07-05 LAB — POCT URINE PREGNANCY: Preg Test, Ur: NEGATIVE

## 2022-07-05 NOTE — ED Provider Notes (Signed)
[facility]    CSN: [id number] Arrival date & time: 07/05/22  1458      History   Chief Complaint Chief Complaint  Patient presents with   Abdominal Pain   nipple tenderness   Nausea    HPI Melissa Gilmore is a 27 y.o. female.   Patient presents to clinic for lower abdominal cramping, nipple tenderness and nausea for the past week.  Reports she has had food aversions, some foods trigger her nausea and she is unable to eat them. Denies emesis.   Her last Depo injection was sometime in March, unsure of exact date.  She has not yet had a menstrual cycle since then.  She does have a history of irregular menses.  She is sexually active, was last sexually active on Sunday.  She is not using condoms or any contraception.  Does not wish to restart her Depo.    The history is provided by the patient and medical records.  Abdominal Pain Associated symptoms: nausea   Associated symptoms: no dysuria     Past Medical History:  Diagnosis Date   Anxiety    Childhood asthma    Depression    Major Depression disorder   Diabetes mellitus without complication (HCC)    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HCC)    Headache    Migraines   Schizophrenia (HCC)     Patient Active Problem List   Diagnosis Date Noted   Lumbar radiculopathy 06/06/2022   Syncope 03/23/2022   Low back pain 03/19/2022   Acute lumbar back pain 03/18/2022   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/2021   Schizophrenia (HCC) 07/17/2021   Depression 07/17/2021   Insulin-requiring or dependent type II diabetes mellitus (HCC) 07/17/2021   Body mass index (BMI) 35.0-35.9, adult 07/17/2021   Dyslipidemia 07/17/2021   History of abnormal uterine bleeding 07/17/2021   Acute cholecystitis with chronic cholecystitis 07/16/2021    Past Surgical History:  Procedure Laterality Date   CHOLECYSTECTOMY N/A 07/17/2021   Procedure: SINGLE SITE LAPAROSCOPIC CHOLECYSTECTOMY WITH INTRAOPERATIVE  CHOLANGIOGRAM; LIVER BIOPSY;  Surgeon: Karie Soda, MD;  Location: [facility];  Service: General;  Laterality: N/A;   IR EPIDUROGRAPHY  03/21/2022   LUMBAR LAMINECTOMY/ DECOMPRESSION WITH MET-RX Right 06/06/2022   Procedure: Right Lumbar three-four Minimally Invasive Surgery Laminectomy/Discectomy;  Surgeon: Jadene Pierini, MD;  Location: [facility] OR;  Service: Neurosurgery;  Laterality: Right;   WISDOM TOOTH EXTRACTION      OB History   No obstetric history on file.      Home Medications    Prior to Admission medications   Medication Sig Start Date End Date Taking? Authorizing Provider  Accu-Chek Softclix Lancets lancets Use to check blood sugar twice daily. Dx E11.65 05/10/22   Georganna Skeans, MD  Blood Glucose Monitoring Suppl (ACCU-CHEK GUIDE ME) w/Device KIT Use to check blood sugar twice daily. Dx E11.65 05/10/22   Georganna Skeans, MD  glucose blood (ACCU-CHEK GUIDE) test strip Use to check blood sugar twice daily. Dx E11.65 05/10/22   Georganna Skeans, MD  insulin glargine (LANTUS SOLOSTAR) 100 UNIT/ML Solostar Pen Inject 20 Units into the skin at bedtime. Patient taking differently: Inject 25 Units into the skin at bedtime. 05/22/22   Georganna Skeans, MD  Insulin Pen Needle (B-D UF III MINI PEN NEEDLES) 31G X 5 MM MISC Use as instructed. Inject into the skin once daily 05/10/22   Georganna Skeans, MD  liraglutide (VICTOZA) 18 MG/3ML SOPN Inject 1.8 mg into the skin daily. 04/27/22  Tilden Fossa, MD  medroxyPROGESTERone (DEPO-PROVERA) 150 MG/ML injection Inject 150 mg into the muscle every 3 (three) months.    [provider]  oxyCODONE (OXY IR/ROXICODONE) 5 MG immediate release tablet Take 1 tablet (5 mg total) by mouth every 4 (four) hours as needed for severe pain. 06/06/22   Iran Sizer, PA-C    Family History Family History  Problem Relation Age of Onset   Diabetes Maternal Grandmother    Stomach cancer Maternal Grandmother     Social History Social History    Tobacco Use   Smoking status: Never   Smokeless tobacco: Never  Vaping Use   Vaping Use: Never used  Substance Use Topics   Alcohol use: Yes    Comment: Occasional- events   Drug use: Not Currently     Allergies   Patient has no known allergies.   Review of Systems Review of Systems  Gastrointestinal:  Positive for abdominal pain and nausea.  Genitourinary:  Negative for dysuria.     Physical Exam Triage Vital Signs ED Triage Vitals  Enc Vitals Group     BP 07/05/22 1638 (!) 138/94     Pulse Rate 07/05/22 1638 86     Resp 07/05/22 1638 16     Temp 07/05/22 1638 98.6 F (37 C)     Temp Source 07/05/22 1638 Oral     SpO2 07/05/22 1638 98 %     Weight --      Height --      Head Circumference --      Peak Flow --      Pain Score 07/05/22 1640 8     Pain Loc --      Pain Edu? --      Excl. in GC? --    No data found.  Updated Vital Signs BP (!) 138/94 (BP Location: Right Arm)   Pulse 86   Temp 98.6 F (37 C) (Oral)   Resp 16   SpO2 98%   Visual Acuity Right Eye Distance:   Left Eye Distance:   Bilateral Distance:    Right Eye Near:   Left Eye Near:    Bilateral Near:     Physical Exam Vitals and nursing note reviewed.  Constitutional:      Appearance: Normal appearance. She is well-developed.  HENT:     Head: Normocephalic and atraumatic.  Cardiovascular:     Rate and Rhythm: Normal rate.  Pulmonary:     Effort: Pulmonary effort is normal. No respiratory distress.  Chest:  Breasts:    Right: Tenderness present. No swelling, bleeding, inverted nipple, mass, nipple discharge or skin change.     Left: Tenderness present. No swelling, bleeding, inverted nipple, mass, nipple discharge or skin change.  Abdominal:     General: Abdomen is flat. Bowel sounds are normal.     Palpations: Abdomen is soft.     Tenderness: There is no abdominal tenderness. There is no guarding or rebound.     Hernia: No hernia is present.  Skin:    General: Skin is  warm and dry.  Neurological:     General: No focal deficit present.     Mental Status: She is alert and oriented to person, place, and time.  Psychiatric:        Mood and Affect: Mood normal.        Behavior: Behavior normal.      UC Treatments / Results  Labs (all labs ordered are listed, but only  abnormal results are displayed) Labs Reviewed  POCT URINE PREGNANCY    EKG   Radiology No results found.  Procedures Procedures (including critical care time)  Medications Ordered in UC Medications - No data to display  Initial Impression / Assessment and Plan / UC Course  I have reviewed the triage vital signs and the nursing notes.  Pertinent labs & imaging results that were available during my care of the patient were reviewed by me and considered in my medical decision making (see chart for details).  Vitals and triage reviewed, patient is hemodynamically stable.  Nipple tenderness, without nipple changes, or discharge.  Lower abdominal cramping without menstrual bleeding.  Nausea.  Not planning on restarting Depo, sexually active without contraception. Urine Preg negative.  Discussed that symptoms are most likely due to hormonal contraceptive withdrawal.  Advised to follow-up with primary care provider for further evaluation and potential contraception if warranted.  Patient verbalized understanding, no questions at this time.     Final Clinical Impressions(s) / UC Diagnoses   Final diagnoses:  Abdominal cramping  Nipple pain  Nausea without vomiting     Discharge Instructions      Your urine pregnancy was negative today in clinic.  I suspect your symptoms are due to your withdrawal from your hormonal contraception.  Please use some form of contraception, unless you are looking to get pregnant.   Your menstrual cycles may continue to be irregular, and may take a while to get back to 'regular' once you are off the Depo.  It can take over an entire year to return to  your normal cycles after getting off Depo injections.  I suggest following up with your primary care for further evaluation of your symptoms and to discuss contraception.  You can take Tylenol and ibuprofen every 4-6 hours, alternating the 2 for any pain or discomfort.      ED Prescriptions   None    PDMP not reviewed this encounter.   Zuleyka Kloc, [place], [place] 07/05/22 1736

## 2022-07-05 NOTE — Discharge Instructions (Addendum)
Your urine pregnancy was negative today in clinic.  I suspect your symptoms are due to your withdrawal from your hormonal contraception.  Please use some form of contraception, unless you are looking to get pregnant.   Your menstrual cycles may continue to be irregular, and may take a while to get back to 'regular' once you are off the Depo.  It can take over an entire year to return to your normal cycles after getting off Depo injections.  I suggest following up with your primary care for further evaluation of your symptoms and to discuss contraception.  You can take Tylenol and ibuprofen every 4-6 hours, alternating the 2 for any pain or discomfort.

## 2022-07-05 NOTE — ED Triage Notes (Signed)
Patient states she stopped taking her depo a week ago.  Patient is now c/o nipple tenderness, abdominal cramping, and nausea x 1 week.  Patient states she has been taking tylenol 1000 mg for her abdominal cramping and the last dose was at 0500

## 2022-07-11 ENCOUNTER — Telehealth: Payer: Medicaid Other | Admitting: Physician Assistant

## 2022-07-11 ENCOUNTER — Encounter: Payer: Self-pay | Admitting: Family Medicine

## 2022-07-11 DIAGNOSIS — B3731 Acute candidiasis of vulva and vagina: Secondary | ICD-10-CM | POA: Diagnosis not present

## 2022-07-11 MED ORDER — FLUCONAZOLE 150 MG PO TABS
ORAL_TABLET | ORAL | 0 refills | Status: DC
Start: 2022-07-11 — End: 2022-12-14

## 2022-07-11 NOTE — Patient Instructions (Signed)
[facility], thank you for joining Piedad Climes, PA-C for today's virtual visit.  While this provider is not your primary care provider (PCP), if your PCP is located in our provider database this encounter information will be shared with them immediately following your visit.   A [facility] MyChart account gives you access to today's visit and all your visits, tests, and labs performed at [facility] " click here if you don't have a [facility] MyChart account or go to [email or web address]  Consent: (Patient) Melissa Gilmore [facility] provided verbal consent for this virtual visit at the beginning of the encounter.  Current Medications:  Current Outpatient Medications:    Accu-Chek Softclix Lancets lancets, Use to check blood sugar twice daily. Dx E11.65, Disp: 100 each, Rfl: 3   Blood Glucose Monitoring Suppl (ACCU-CHEK GUIDE ME) w/Device KIT, Use to check blood sugar twice daily. Dx E11.65, Disp: 1 kit, Rfl: 0   glucose blood (ACCU-CHEK GUIDE) test strip, Use to check blood sugar twice daily. Dx E11.65, Disp: 100 each, Rfl: 12   insulin glargine (LANTUS SOLOSTAR) 100 UNIT/ML Solostar Pen, Inject 20 Units into the skin at bedtime. (Patient taking differently: Inject 25 Units into the skin at bedtime.), Disp: 15 mL, Rfl: 0   Insulin Pen Needle (B-D UF III MINI PEN NEEDLES) 31G X 5 MM MISC, Use as instructed. Inject into the skin once daily, Disp: 100 each, Rfl: 3   liraglutide (VICTOZA) 18 MG/3ML SOPN, Inject 1.8 mg into the skin daily., Disp: 9 mL, Rfl: 0   medroxyPROGESTERone (DEPO-PROVERA) 150 MG/ML injection, Inject 150 mg into the muscle every 3 (three) months., Disp: , Rfl:    oxyCODONE (OXY IR/ROXICODONE) 5 MG immediate release tablet, Take 1 tablet (5 mg total) by mouth every 4 (four) hours as needed for severe pain., Disp: 30 tablet, Rfl: 0   Medications ordered in this encounter:  No orders of the defined types were placed in this encounter.     *If you need refills on other medications prior to your next appointment, please contact your pharmacy*  Follow-Up: Call back or seek an in-person evaluation if the symptoms worsen or if the condition fails to improve as anticipated.  [facility] [phone]  Other Instructions Vaginal Yeast Infection, Adult  Vaginal yeast infection is a condition that causes vaginal discharge as well as soreness, swelling, and redness (inflammation) of the vagina. This is a common condition. Some women get this infection frequently. What are the causes? This condition is caused by a change in the normal balance of the yeast (Candida) and normal bacteria that live in the vagina. This change causes an overgrowth of yeast, which causes the inflammation. What increases the risk? The condition is more likely to develop in women who: Take antibiotic medicines. Have diabetes. Take birth control pills. Are pregnant. Douche often. Have a weak body defense system (immune system). Have been taking steroid medicines for a long time. Frequently wear tight clothing. What are the signs or symptoms? Symptoms of this condition include: White, thick, creamy vaginal discharge. Swelling, itching, redness, and irritation of the vagina. The lips of the vagina (labia) may be affected as well. Pain or a burning feeling while urinating. Pain during sex. How is this diagnosed? This condition is diagnosed based on: Your medical history. A physical exam. A pelvic exam. Your health care provider will examine a sample of your vaginal discharge under a microscope. Your health care provider may send this sample  for testing to confirm the diagnosis. How is this treated? This condition is treated with medicine. Medicines may be over-the-counter or prescription. You may be told to use one or more of the following: Medicine that is taken by mouth (orally). Medicine that is applied as a cream  (topically). Medicine that is inserted directly into the vagina (suppository). Follow these instructions at home: Take or apply over-the-counter and prescription medicines only as told by your health care provider. Do not use tampons until your health care provider approves. Do not have sex until your infection has cleared. Sex can prolong or worsen your symptoms of infection. Ask your health care provider when it is safe to resume sexual activity. Keep all follow-up visits. This is important. How is this prevented?  Do not wear tight clothes, such as pantyhose or tight pants. Wear breathable cotton underwear. Do not use douches, perfumed soap, creams, or powders. Wipe from front to back after using the toilet. If you have diabetes, keep your blood sugar levels under control. Ask your health care provider for other ways to prevent yeast infections. Contact a health care provider if: You have a fever. Your symptoms go away and then return. Your symptoms do not get better with treatment. Your symptoms get worse. You have new symptoms. You develop blisters in or around your vagina. You have blood coming from your vagina and it is not your menstrual period. You develop pain in your abdomen. Summary Vaginal yeast infection is a condition that causes discharge as well as soreness, swelling, and redness (inflammation) of the vagina. This condition is treated with medicine. Medicines may be over-the-counter or prescription. Take or apply over-the-counter and prescription medicines only as told by your health care provider. Do not douche. Resume sexual activity or use of tampons as instructed by your health care provider. Contact a health care provider if your symptoms do not get better with treatment or your symptoms go away and then return. This information is not intended to replace advice given to you by your health care provider. Make sure you discuss any questions you have with your health  care provider. Document Revised: 04/12/2020 Document Reviewed: 04/12/2020 Elsevier Patient Education  2024 Elsevier Inc.    If you have been instructed to have an in-person evaluation today at a local Urgent Care facility, please use the link below. It will take you to a list of all of our available [facility], including address, phone number and hours of operation. Please do not delay care.  [facility]  If you or a family member do not have a primary care provider, use the link below to schedule a visit and establish care. When you choose a [facility] primary care physician or advanced practice provider, you gain a long-term partner in health. Find a Primary Care Provider  Learn more about [facility]'s in-office and virtual care options: [facility] - Get Care Now

## 2022-07-11 NOTE — Progress Notes (Signed)
Virtual Visit Consent   Shayla Noguera [facility], you are scheduled for a virtual visit with a [facility] provider today. Just as with appointments in the office, your consent must be obtained to participate. Your consent will be active for this visit and any virtual visit you may have with one of our providers in the next 365 days. If you have a MyChart account, a copy of this consent can be sent to you electronically.  As this is a virtual visit, video technology does not allow for your provider to perform a traditional examination. This may limit your provider's ability to fully assess your condition. If your provider identifies any concerns that need to be evaluated in person or the need to arrange testing (such as labs, EKG, etc.), we will make arrangements to do so. Although advances in technology are sophisticated, we cannot ensure that it will always work on either your end or our end. If the connection with a video visit is poor, the visit may have to be switched to a telephone visit. With either a video or telephone visit, we are not always able to ensure that we have a secure connection.  By engaging in this virtual visit, you consent to the provision of healthcare and authorize for your insurance to be billed (if applicable) for the services provided during this visit. Depending on your insurance coverage, you may receive a charge related to this service.  I need to obtain your verbal consent now. Are you willing to proceed with your visit today? Aubreyrose Ryser [facility] has provided verbal consent on 07/11/2022 for a virtual visit (video or telephone). Piedad Climes, [place]  Date: 07/11/2022 6:47 PM  Virtual Visit via Video Note   I, Piedad Climes, connected with  Amberly Eckmann [place]  ([id number], Feb 20, 1995) on 07/11/22 at  7:00 PM EDT by a video-enabled telemedicine application and verified that I am speaking with the correct person using two  identifiers.  Location: Patient: Virtual Visit Location Patient: Home Provider: Virtual Visit Location Provider: Home Office   I discussed the limitations of evaluation and management by telemedicine and the availability of in person appointments. The patient expressed understanding and agreed to proceed.    History of Present Illness: Melissa Gilmore is a 27 y.o. who identifies as a female who was assigned female at birth, and is being seen today for some itching in the private area over the past 3 days, worsening today. Denies skin changes or discharge. Denies change to urinary habits overall. Has had to scratch the area which causes some irritation.  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 Repeat in 3 days if needed., Disp: 2 tablet, Rfl: 0   Accu-Chek Softclix Lancets lancets, Use to check blood  sugar twice daily. Dx E11.65, Disp: 100 each, Rfl: 3   Blood Glucose Monitoring Suppl (ACCU-CHEK GUIDE ME) w/Device KIT, Use to check blood sugar twice daily. Dx E11.65, Disp: 1 kit, Rfl: 0   glucose blood (ACCU-CHEK GUIDE) test strip, Use to check blood sugar twice daily. Dx E11.65, Disp: 100 each, Rfl: 12   insulin glargine (LANTUS SOLOSTAR) 100 UNIT/ML  Solostar Pen, Inject 20 Units into the skin at bedtime. (Patient taking differently: Inject 25 Units into the skin at bedtime.), Disp: 15 mL, Rfl: 0   Insulin Pen Needle (B-D UF III MINI PEN NEEDLES) 31G X 5 MM MISC, Use as instructed. Inject into the skin once daily, Disp: 100 each, Rfl: 3   liraglutide (VICTOZA) 18 MG/3ML SOPN, Inject 1.8 mg into the skin daily., Disp: 9 mL, Rfl: 0  Observations/Objective: Patient is well-developed, well-nourished in no acute distress.  Resting comfortably at home.  Head is normocephalic, atraumatic.  No labored breathing. Speech is clear and coherent with logical content.  Patient is alert and oriented at baseline.   Assessment and Plan: 1. Yeast vaginitis - fluconazole (DIFLUCAN) 150 MG tablet; Take 1 tablet PO once. Repeat in 3 days if needed.  Dispense: 2 tablet; Refill: 0  Most likely cause of current symptoms especially giving uncontrolled diabetes. Supportive measures and OTC medications reviewed. Diflucan per orders. Follow-up in person if not resolving.   Follow Up Instructions: I discussed the assessment and treatment plan with the patient. The patient was provided an opportunity to ask questions and all were answered. The patient agreed with the plan and demonstrated an understanding of the instructions.  A copy of instructions were sent to the patient via MyChart unless otherwise noted below.   The patient was advised to call back or seek an in-person evaluation if the symptoms worsen or if the condition fails to improve as anticipated.  Time:  I spent 10 minutes with the patient via telehealth technology discussing the above problems/concerns.    Piedad Climes, PA-C

## 2022-08-23 NOTE — Progress Notes (Signed)
HPI: FU syncope.  Patient previously admitted with back pain.  MRI February 2024 showed disc extrusion, spinal stenosis and right lateral recess stenosis.  She had an epidural steroid injection with improvement.  Prior to her admission she was having syncopal episodes that occurred when her back pain was severe.  Episodes were felt likely secondary to vagal syncope.  Monitor March 2024 showed sinus rhythm.  Echocardiogram April 2024 showed normal LV function, mild left ventricular hypertrophy.  Since last seen the patient denies any dyspnea on exertion, orthopnea, PND, pedal edema, palpitations, syncope or chest pain.   Current Outpatient Medications  Medication Sig Dispense Refill   Accu-Chek Softclix Lancets lancets Use to check blood sugar twice daily. Dx E11.65 100 each 3   Blood Glucose Monitoring Suppl (ACCU-CHEK GUIDE ME) w/Device KIT Use to check blood sugar twice daily. Dx E11.65 1 kit 0   glucose blood (ACCU-CHEK GUIDE) test strip Use to check blood sugar twice daily. Dx E11.65 100 each 12   insulin glargine (LANTUS SOLOSTAR) 100 UNIT/ML Solostar Pen Inject 20 Units into the skin at bedtime. (Patient taking differently: Inject 25 Units into the skin at bedtime.) 15 mL 0   Insulin Pen Needle (B-D UF III MINI PEN NEEDLES) 31G X 5 MM MISC Use as instructed. Inject into the skin once daily 100 each 3   liraglutide (VICTOZA) 18 MG/3ML SOPN Inject 1.8 mg into the skin daily. 9 mL 0   fluconazole (DIFLUCAN) 150 MG tablet Take 1 tablet PO once. Repeat in 3 days if needed. (Patient not taking: Reported on 08/31/2022) 2 tablet 0   No current facility-administered medications for this visit.     Past Medical History:  Diagnosis Date   Anxiety    Childhood asthma    Depression    Major Depression disorder   Diabetes mellitus without complication (HCC)    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HCC)    Headache    Migraines   Schizophrenia (HCC)     Past Surgical History:  Procedure  Laterality Date   CHOLECYSTECTOMY N/A 07/17/2021   Procedure: SINGLE SITE LAPAROSCOPIC CHOLECYSTECTOMY WITH INTRAOPERATIVE CHOLANGIOGRAM; LIVER BIOPSY;  Surgeon: Karie Soda, MD;  Location: [facility]S;  Service: General;  Laterality: N/A;   IR EPIDUROGRAPHY  03/21/2022   LUMBAR LAMINECTOMY/ DECOMPRESSION WITH MET-RX Right 06/06/2022   Procedure: Right Lumbar three-four Minimally Invasive Surgery Laminectomy/Discectomy;  Surgeon: Jadene Pierini, MD;  Location: [facility] OR;  Service: Neurosurgery;  Laterality: Right;   WISDOM TOOTH EXTRACTION      Social History   Socioeconomic History   Marital status: Single    Spouse name: Not on file   Number of children: Not on file   Years of education: Not on file   Highest education level: Not on file  Occupational History   Not on file  Tobacco Use   Smoking status: Never   Smokeless tobacco: Never  Vaping Use   Vaping status: Never Used  Substance and Sexual Activity   Alcohol use: Yes    Comment: Occasional- events   Drug use: Not Currently   Sexual activity: Not Currently  Other Topics Concern   Not on file  Social History Narrative   Not on file   Social Determinants of Health   Financial Resource Strain: High Risk (03/16/2022)   Overall Financial Resource Strain (CARDIA)    Difficulty of Paying Living Expenses: Hard  Food Insecurity: Food Insecurity Present (03/18/2022)   Hunger Vital Sign    Worried  About Running Out of Food in the Last Year: Often true    Ran Out of Food in the Last Year: Often true  Transportation Needs: No Transportation Needs (03/18/2022)   PRAPARE - Administrator, Civil Service (Medical): No    Lack of Transportation (Non-Medical): No  Physical Activity: Not on file  Stress: Stress Concern Present (03/16/2022)   [employer] of Occupational Health - Occupational Stress Questionnaire    Feeling of Stress : To some extent  Social Connections: Moderately Isolated (03/16/2022)   Social Connection  and Isolation Panel [NHANES]    Frequency of Communication with Friends and Family: More than three times a week    Frequency of Social Gatherings with Friends and Family: More than three times a week    Attends Religious Services: Never    Database administrator or Organizations: No    Attends Banker Meetings: Never    Marital Status: Living with partner  Intimate Partner Violence: Not At Risk (03/18/2022)   Humiliation, Afraid, Rape, and Kick questionnaire    Fear of Current or Ex-Partner: No    Emotionally Abused: No    Physically Abused: No    Sexually Abused: No    Family History  Problem Relation Age of Onset   Diabetes Maternal Grandmother    Stomach cancer Maternal Grandmother     ROS: Mild back pain but no fevers or chills, productive cough, hemoptysis, dysphasia, odynophagia, melena, hematochezia, dysuria, hematuria, rash, seizure activity, orthopnea, PND, pedal edema, claudication. Remaining systems are negative.  Physical Exam: Well-developed well-nourished in no acute distress.  Skin is warm and dry.  HEENT is normal.  Neck is supple.  Chest is clear to auscultation with normal expansion.  Cardiovascular exam is regular rate and rhythm.  Abdominal exam nontender or distended. No masses palpated. Extremities show no edema. neuro grossly intact   A/P  1 syncope-previous episodes felt likely vagal in etiology.  LV function normal and monitor showed no significant arrhythmia.  She will continue hydration and increase sodium intake.  We felt previously that her symptoms would improve as her back pain improved.  She has had no recurrences since her back surgery and improvement in her back pain.  2 diabetes mellitus-Per primary care.  Olga Millers, MD

## 2022-08-31 ENCOUNTER — Ambulatory Visit: Payer: MEDICAID | Attending: Cardiology | Admitting: Cardiology

## 2022-08-31 ENCOUNTER — Encounter: Payer: Self-pay | Admitting: Cardiology

## 2022-08-31 VITALS — BP 120/80 | HR 80 | Ht 64.0 in | Wt 233.0 lb

## 2022-08-31 DIAGNOSIS — R55 Syncope and collapse: Secondary | ICD-10-CM

## 2022-08-31 NOTE — Patient Instructions (Signed)
    Follow-Up: At [facility], you and your health needs are our priority.  As part of our continuing mission to provide you with exceptional heart care, we have created designated Provider Care Teams.  These Care Teams include your primary Cardiologist (physician) and Advanced Practice Providers (APPs -  Physician Assistants and Nurse Practitioners) who all work together to provide you with the care you need, when you need it.  We recommend signing up for the patient portal called "MyChart".  Sign up information is provided on this After Visit Summary.  MyChart is used to connect with patients for Virtual Visits (Telemedicine).  Patients are able to view lab/test results, encounter notes, upcoming appointments, etc.  Non-urgent messages can be sent to your provider as well.   To learn more about what you can do with MyChart, go to [email or web address].    Your next appointment:   As needed        

## 2022-09-29 ENCOUNTER — Encounter: Payer: Self-pay | Admitting: Family Medicine

## 2022-09-29 ENCOUNTER — Ambulatory Visit (INDEPENDENT_AMBULATORY_CARE_PROVIDER_SITE_OTHER): Payer: MEDICAID | Admitting: Family Medicine

## 2022-09-29 VITALS — BP 112/73 | HR 69 | Temp 98.1°F | Resp 18 | Ht 64.0 in | Wt 226.0 lb

## 2022-09-29 DIAGNOSIS — Z794 Long term (current) use of insulin: Secondary | ICD-10-CM

## 2022-09-29 DIAGNOSIS — S0181XA Laceration without foreign body of other part of head, initial encounter: Secondary | ICD-10-CM

## 2022-09-29 DIAGNOSIS — E1165 Type 2 diabetes mellitus with hyperglycemia: Secondary | ICD-10-CM

## 2022-09-29 LAB — POCT GLYCOSYLATED HEMOGLOBIN (HGB A1C): Hemoglobin A1C: 11 % — AB (ref 4.0–5.6)

## 2022-09-29 NOTE — Progress Notes (Unsigned)
New Patient Office Visit  Subjective    Patient ID: Melissa Gilmore, female    DOB: 12/18/1995  Age: 27 y.o. MRN: [id number]  CC:  Chief Complaint  Patient presents with   Follow-up    3 month follow up  Patient wants a above eye laceration check.  Patient wants her hormone level check.    HPI Melissa Gilmore [facility] presents to establish care ***  Outpatient Encounter Medications as of 09/29/2022  Medication Sig   Accu-Chek Softclix Lancets lancets Use to check blood sugar twice daily. Dx E11.65   Blood Glucose Monitoring Suppl (ACCU-CHEK GUIDE ME) w/Device KIT Use to check blood sugar twice daily. Dx E11.65   fluconazole (DIFLUCAN) 150 MG tablet Take 1 tablet PO once. Repeat in 3 days if needed. (Patient not taking: Reported on 08/31/2022)   glucose blood (ACCU-CHEK GUIDE) test strip Use to check blood sugar twice daily. Dx E11.65   insulin glargine (LANTUS SOLOSTAR) 100 UNIT/ML Solostar Pen Inject 20 Units into the skin at bedtime. (Patient taking differently: Inject 25 Units into the skin at bedtime.)   Insulin Pen Needle (B-D UF III MINI PEN NEEDLES) 31G X 5 MM MISC Use as instructed. Inject into the skin once daily   liraglutide (VICTOZA) 18 MG/3ML SOPN Inject 1.8 mg into the skin daily.   No facility-administered encounter medications on file as of 09/29/2022.    Past Medical History:  Diagnosis Date   Anxiety    Childhood asthma    Depression    Major Depression disorder   Diabetes mellitus without complication (HCC)    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HCC)    Headache    Migraines   Schizophrenia (HCC)     Past Surgical History:  Procedure Laterality Date   CHOLECYSTECTOMY N/A 07/17/2021   Procedure: SINGLE SITE LAPAROSCOPIC CHOLECYSTECTOMY WITH INTRAOPERATIVE CHOLANGIOGRAM; LIVER BIOPSY;  Surgeon: Melissa Soda, MD;  Location: [facility]S;  Service: General;  Laterality: N/A;   IR EPIDUROGRAPHY  03/21/2022   LUMBAR LAMINECTOMY/ DECOMPRESSION WITH MET-RX  Right 06/06/2022   Procedure: Right Lumbar three-four Minimally Invasive Surgery Laminectomy/Discectomy;  Surgeon: Melissa Pierini, MD;  Location: [facility] OR;  Service: Neurosurgery;  Laterality: Right;   WISDOM TOOTH EXTRACTION      Family History  Problem Relation Age of Onset   Diabetes Maternal Grandmother    Stomach cancer Maternal Grandmother     Social History   Socioeconomic History   Marital status: Single    Spouse name: Not on file   Number of children: Not on file   Years of education: Not on file   Highest education level: Not on file  Occupational History   Not on file  Tobacco Use   Smoking status: Never   Smokeless tobacco: Never  Vaping Use   Vaping status: Never Used  Substance and Sexual Activity   Alcohol use: Yes    Comment: Occasional- events   Drug use: Not Currently   Sexual activity: Yes    Birth control/protection: None  Other Topics Concern   Not on file  Social History Narrative   Not on file   Social Determinants of Health   Financial Resource Strain: High Risk (03/16/2022)   Overall Financial Resource Strain (CARDIA)    Difficulty of Paying Living Expenses: Hard  Food Insecurity: Food Insecurity Present (03/18/2022)   Hunger Vital Sign    Worried About Running Out of Food in the Last Year: Often true    Ran Out of Food  in the Last Year: Often true  Transportation Needs: No Transportation Needs (03/18/2022)   PRAPARE - Administrator, Civil Service (Medical): No    Lack of Transportation (Non-Medical): No  Physical Activity: Not on file  Stress: Stress Concern Present (03/16/2022)   [employer] of Occupational Health - Occupational Stress Questionnaire    Feeling of Stress : To some extent  Social Connections: Moderately Isolated (03/16/2022)   Social Connection and Isolation Panel [NHANES]    Frequency of Communication with Friends and Family: More than three times a week    Frequency of Social Gatherings with Friends and  Family: More than three times a week    Attends Religious Services: Never    Database administrator or Organizations: No    Attends Banker Meetings: Never    Marital Status: Living with partner  Intimate Partner Violence: Not At Risk (03/18/2022)   Humiliation, Afraid, Rape, and Kick questionnaire    Fear of Current or Ex-Partner: No    Emotionally Abused: No    Physically Abused: No    Sexually Abused: No    ROS      Objective    BP 112/73 (BP Location: Right Arm, Patient Position: Sitting, Cuff Size: Large)   Pulse 69   Temp 98.1 F (36.7 C) (Oral)   Resp 18   Ht 5\' 4"  (1.626 m)   Wt 226 lb (102.5 kg)   SpO2 97%   BMI 38.79 kg/m   Physical Exam  {Labs (Optional):23779}    Assessment & Plan:   Problem List Items Addressed This Visit   None Visit Diagnoses     Type 2 diabetes mellitus with hyperglycemia, with long-term current use of insulin (HCC)    -  Primary   Relevant Orders   POCT glycosylated hemoglobin (Hb A1C) (Completed)   Microalbumin / creatinine urine ratio       Return in about 3 months (around 12/30/2022) for follow up, chronic med issues.   Melissa Raymond, MD

## 2022-09-29 NOTE — Progress Notes (Unsigned)
3 month follow up.  Patient wants above the eye laceration check.  Patient wants her hormone levels check.

## 2022-09-30 LAB — MICROALBUMIN / CREATININE URINE RATIO
Creatinine, Urine: 139.3 mg/dL
Microalb/Creat Ratio: 31 mg/g{creat} — ABNORMAL HIGH (ref 0–29)
Microalbumin, Urine: 42.6 ug/mL

## 2022-10-02 ENCOUNTER — Encounter: Payer: Self-pay | Admitting: Family Medicine

## 2022-10-05 ENCOUNTER — Other Ambulatory Visit: Payer: Self-pay

## 2022-10-05 ENCOUNTER — Telehealth: Payer: Self-pay | Admitting: Pharmacist

## 2022-10-05 ENCOUNTER — Ambulatory Visit: Payer: MEDICAID | Attending: Family Medicine | Admitting: Pharmacist

## 2022-10-05 DIAGNOSIS — Z7985 Long-term (current) use of injectable non-insulin antidiabetic drugs: Secondary | ICD-10-CM

## 2022-10-05 DIAGNOSIS — Z794 Long term (current) use of insulin: Secondary | ICD-10-CM | POA: Diagnosis not present

## 2022-10-05 DIAGNOSIS — E1165 Type 2 diabetes mellitus with hyperglycemia: Secondary | ICD-10-CM | POA: Diagnosis not present

## 2022-10-05 MED ORDER — OZEMPIC (0.25 OR 0.5 MG/DOSE) 2 MG/3ML ~~LOC~~ SOPN
0.5000 mg | PEN_INJECTOR | SUBCUTANEOUS | 1 refills | Status: AC
Start: 2022-10-05 — End: ?
  Filled 2022-10-05: qty 3, 28d supply, fill #0
  Filled 2022-11-02: qty 3, 28d supply, fill #1

## 2022-10-05 MED ORDER — LANTUS SOLOSTAR 100 UNIT/ML ~~LOC~~ SOPN
30.0000 [IU] | PEN_INJECTOR | Freq: Every day | SUBCUTANEOUS | 0 refills | Status: DC
Start: 2022-10-05 — End: 2022-12-12
  Filled 2022-10-05: qty 9, 30d supply, fill #0
  Filled 2022-11-02: qty 6, 20d supply, fill #1

## 2022-10-05 MED ORDER — FREESTYLE LIBRE 3 SENSOR MISC
6 refills | Status: DC
Start: 2022-10-05 — End: 2023-05-15
  Filled 2022-10-05: qty 2, 28d supply, fill #0
  Filled 2022-11-02: qty 2, 28d supply, fill #1
  Filled 2022-12-12: qty 2, 28d supply, fill #2
  Filled 2023-01-08: qty 2, 28d supply, fill #3
  Filled 2023-02-11 – 2023-02-26 (×2): qty 2, 28d supply, fill #4
  Filled 2023-03-25 – 2023-03-27 (×2): qty 2, 28d supply, fill #5
  Filled 2023-04-23: qty 2, 28d supply, fill #6

## 2022-10-05 NOTE — Telephone Encounter (Signed)
Can we start a PA for her Melissa Gilmore? We may also need one for Ozempic. Has tried and failed Victoza.

## 2022-10-05 NOTE — Progress Notes (Signed)
    S:     PCP: Dr. Andrey Campanile  Patient arrives in good spirits. Presents for diabetes evaluation, education, and management. Patient was referred and last seen by Primary Care Provider on 09/29/2022. I've seen her before but her last visit with me was in 2021.   Patient reports diabetes was diagnosed in 2018. Has been on insulin since ~2021. Does not appear to have been hospitalized exclusively for DM or DKA. No hx of pancreatitis or thyroid cancer. She has taken and tolerated Victoza before. Still takes this and Lantus daily today with no issues. No hx of clinical ASCVD, CHF, CKD.   Today, patient reports that she is taking her medications daily. She has no complaints today.  Family/Social History: -FHx: Diabetes (MGP) -Tobacco user: never  Insurance coverage/medication affordability: [employer]  Current diabetes medications include: Lantus 20 units at bedtime, Victoza 1.8 mg daily Current hyperlipidemia medications include: none  Patient denies hypoglycemic events.  Patient reported dietary habits: Eats 4 tortillas (down from 8 daily), rice, fruits, not a big of vegetables, occasionally eats red meats; drinks water, rarely drinks soda, sometimes juice  Patient-reported exercise habits: not currently    Patient denies nocturia (nighttime urination).  Patient denies neuropathy (nerve pain). Patient reports some blurred vision.  Patient denies visual changes.  Patient reported blood sugars: -190s  O: No GM or CGM in place.   Lab Results  Component Value Date   HGBA1C 11.0 (A) 09/29/2022   There were no vitals filed for this visit.  Lipid Panel     Component Value Date/Time   CHOL 194 08/06/2020 1145   TRIG 188 (H) 08/06/2020 1145   HDL 49 08/06/2020 1145   CHOLHDL 4.0 08/06/2020 1145   LDLCALC 112 (H) 08/06/2020 1145    Home fasting blood sugars: not checking since last visit  2 hour post-meal/random blood sugars: not checking since last visit  Clinical  Atherosclerotic Cardiovascular Disease (ASCVD): No  The ASCVD Risk score (Arnett DK, et al., 2019) failed to calculate for the following reasons:   The 2019 ASCVD risk score is only valid for ages 53 to 70   A/P: Diabetes longstanding currently uncontrolled with A1C 11.0%. Patient endorses medication adherence. Will change Victoza to weekly Ozempic. She is amenable to this and hopefully we will get better glycemic control. Additionally, will try to get CGM covered for her.  -Stop Victoza. -Start Ozempic 0.5 mg weekly.  -Continue Lantus 30u daily.  -Extensively discussed pathophysiology of diabetes, recommended lifestyle interventions, dietary effects on blood sugar control -Counseled on s/sx of and management of hypoglycemia -Next A1C anticipated 12/2022.   Written patient instructions provided. Total time in face to face counseling 25 minutes.   Follow up Pharmacist Clinic Visit in 4 weeks.     Butch Penny, PharmD, P[place]y [place], CPP Clinical Pharmacist [facility] [phone]

## 2022-10-06 ENCOUNTER — Other Ambulatory Visit: Payer: Self-pay

## 2022-10-06 NOTE — Telephone Encounter (Signed)
Thank you, friend. Patient called and informed.

## 2022-10-20 ENCOUNTER — Other Ambulatory Visit: Payer: Self-pay

## 2022-10-31 ENCOUNTER — Ambulatory Visit: Payer: MEDICAID | Admitting: Internal Medicine

## 2022-10-31 ENCOUNTER — Encounter: Payer: Self-pay | Admitting: Internal Medicine

## 2022-10-31 ENCOUNTER — Other Ambulatory Visit: Payer: Self-pay

## 2022-10-31 VITALS — BP 116/80 | HR 97 | Temp 97.3°F | Resp 16 | Ht 64.76 in | Wt 232.4 lb

## 2022-10-31 DIAGNOSIS — J301 Allergic rhinitis due to pollen: Secondary | ICD-10-CM

## 2022-10-31 DIAGNOSIS — T781XXA Other adverse food reactions, not elsewhere classified, initial encounter: Secondary | ICD-10-CM

## 2022-10-31 DIAGNOSIS — J3081 Allergic rhinitis due to animal (cat) (dog) hair and dander: Secondary | ICD-10-CM

## 2022-10-31 DIAGNOSIS — J393 Upper respiratory tract hypersensitivity reaction, site unspecified: Secondary | ICD-10-CM | POA: Diagnosis not present

## 2022-10-31 DIAGNOSIS — L508 Other urticaria: Secondary | ICD-10-CM

## 2022-10-31 DIAGNOSIS — J3089 Other allergic rhinitis: Secondary | ICD-10-CM

## 2022-10-31 MED ORDER — CETIRIZINE HCL 10 MG PO TABS: 10.0000 mg | ORAL_TABLET | Freq: Every day | ORAL | 5 refills | Status: DC | PRN

## 2022-10-31 MED ORDER — AZELASTINE HCL 0.1 % NA SOLN: 1.0000 | Freq: Two times a day (BID) | NASAL | 5 refills | Status: DC | PRN

## 2022-10-31 MED ORDER — FLUTICASONE PROPIONATE 50 MCG/ACT NA SUSP: 2.0000 | Freq: Every day | NASAL | 5 refills | Status: DC

## 2022-10-31 NOTE — Patient Instructions (Addendum)
Reactions to Fragrances - Avoid exposure to fragrances.  This is a common irritant reaction.  History of Acute Urticaria - At this time etiology of hives is unknown. Hives can be caused by a variety of different triggers including illness/infection, exercise, pressure, vibrations, extremes of temperature to name a few however majority of the time there is no identifiable trigger.  - Please let us know if this recurs.   Oral Pruritus-Pollen Food Allergy Syndrome: - Negative SPT 10/2022 to common allergenic foods.  - These symptoms are typically not life-threatening and are because of a cross reaction between a pollen you are allergic to, and to a protein in specific foods (such as fresh fruits, vegetables, and nuts). - If you can eat these things and tolerate the symptoms, it is fine to continue to do so.  If not, you may avoid these fresh fruits and vegetables.   - Heating these foods, buying them canned, and peeling these foods should allow them to be consumed without symptoms or with less symptoms.  Allergic Rhinitis: - Positive skin test 10/2022: trees, grasses, weeds, dust mites, cats, cockroach.  - Avoidance measures discussed. - Use nasal saline rinses before nose sprays such as with Neilmed Sinus Rinse.  Use distilled water.   - Use Flonase 2 sprays each nostril daily. Aim upward and outward. - Use Azelastine 1-2 sprays each nostril twice daily as needed for runny nose, drainage, sneezing, congestion. Aim upward and outward. - Use Zyrtec 10 mg daily as needed for runny nose, sneezing, itchy watery eyes..  - Consider allergy shots as long term control of your symptoms by teaching your immune system to be more tolerant of your allergy triggers  ALLERGEN AVOIDANCE MEASURES   Cockroach Limit spread of food around the house; especially keep food out of bedrooms. Keep food and garbage in closed containers with a tight lid.  Never leave food out in the kitchen.  Do not leave out pet food or  dirty food bowls. Mop the kitchen floor and wash countertops at least once a week. Repair leaky pipes and faucets so there is no standing water to attract roaches. Plug up cracks in the house through which cockroaches can enter. Use bait stations and approved pesticides to reduce cockroach infestation. Pollen Avoidance Pollen levels are highest during the mid-day and afternoon.  Consider this when planning outdoor activities. Avoid being outside when the grass is being mowed, or wear a mask if the pollen-allergic person must be the one to mow the grass. Keep the windows closed to keep pollen outside of the home. Use an air conditioner to filter the air. Take a shower, wash hair, and change clothing after working or playing outdoors during pollen season. Pet Dander Keep the pet out of your bedroom and restrict it to only a few rooms. Be advised that keeping the pet in only one room will not limit the allergens to that room. Don't pet, hug or kiss the pet; if you do, wash your hands with soap and water. High-efficiency particulate air (HEPA) cleaners run continuously in a bedroom or living room can reduce allergen levels over time. Regular use of a high-efficiency vacuum cleaner or a central vacuum can reduce allergen levels. Giving your pet a bath at least once a week can reduce airborne allergen.

## 2022-10-31 NOTE — Progress Notes (Signed)
NEW PATIENT  Date of Service/Encounter:  10/31/22  Consult requested by: Georganna Skeans, MD   Subjective:   Melissa Gilmore (DOB: April 17, 1995) is a 27 y.o. female who presents to the clinic on 10/31/2022 with a chief complaint of Establish Care and Allergy Testing .    History obtained from: chart review and patient.  Discussed the use of AI scribe software for clinical note transcription with the patient, who gave verbal consent to proceed.  History of Present Illness   The patient presents with a history of multiple allergic reactions and seeks to identify the allergens. She reports occasional oral pruritus and mild tongue swelling after eating, but cannot identify a specific food trigger. The symptoms occur sporadically and are not associated with respiratory distress or skin changes or GI symptoms.  This rarely happens.  Not sure which food.   Several years ago, the patient experienced a severe allergic reaction characterized by hives and swelling on her arms. She initially attributed this to sunscreen use, but subsequent exposure to sunscreen did not provoke a reaction. Has never happened again. Can't recall if she was sick at the time or stressed.   The patient also reports respiratory distress when exposed to aerosol deodorants, describing a choking sensation even 30 minutes after the spray is used. No history of asthma. Denies any trouble with wheezing, shortness of breath, cough.   In addition to these reactions, the patient suffers from frequent runny and stuffy noses, particularly in the spring, summer, and sometimes fall. She recalls having significant pollen allergies as a child, but these symptoms have lessened since moving to and from [name].The patient is aware of a cat allergy but manages this by washing her hands immediately after petting her cats. She also has a history of frequent nosebleeds, for which a blood vessel was cauterized. For symptom management, the  patient uses a generic version of Benadryl.         Past Medical History: Past Medical History:  Diagnosis Date   Anxiety    Childhood asthma    Depression    Major Depression disorder   Diabetes mellitus without complication (HCC)    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HCC)    Headache    Migraines   Schizophrenia (HCC)    Urticaria     Past Surgical History: Past Surgical History:  Procedure Laterality Date   CHOLECYSTECTOMY N/A 07/17/2021   Procedure: SINGLE SITE LAPAROSCOPIC CHOLECYSTECTOMY WITH INTRAOPERATIVE CHOLANGIOGRAM; LIVER BIOPSY;  Surgeon: Karie Soda, MD;  Location: [facility] ORS;  Service: General;  Laterality: N/A;   IR EPIDUROGRAPHY  03/21/2022   LUMBAR LAMINECTOMY/ DECOMPRESSION WITH MET-RX Right 06/06/2022   Procedure: Right Lumbar three-four Minimally Invasive Surgery Laminectomy/Discectomy;  Surgeon: Jadene Pierini, MD;  Location: [facility] OR;  Service: Neurosurgery;  Laterality: Right;   WISDOM TOOTH EXTRACTION      Family History: Family History  Problem Relation Age of Onset   Allergic rhinitis Father    Diabetes Maternal Grandmother    Stomach cancer Maternal Grandmother     Social History:  Flooring in bedroom: laminate Pets: cat Tobacco use/exposure: none Job: none  Medication List:  Allergies as of 10/31/2022   No Known Allergies      Medication List        Accurate as of October 31, 2022 10:33 AM. If you have any questions, ask your nurse or doctor.          Accu-Chek Guide Me w/Device Kit Use to check blood  sugar twice daily. Dx E11.65   Accu-Chek Guide test strip Generic drug: glucose blood Use to check blood sugar twice daily. Dx E11.65   Accu-Chek Softclix Lancets lancets Use to check blood sugar twice daily. Dx E11.65   azelastine 0.1 % nasal spray Commonly known as: ASTELIN Place 1 spray into both nostrils 2 (two) times daily as needed. Use in each nostril as directed Started by: Birder Robson   B-D UF III MINI PEN  NEEDLES 31G X 5 MM Misc Generic drug: Insulin Pen Needle Use as instructed. Inject into the skin once daily   cetirizine 10 MG tablet Commonly known as: ZyrTEC Allergy Take 1 tablet (10 mg total) by mouth daily as needed for allergies. Started by: Birder Robson   fluconazole 150 MG tablet Commonly known as: DIFLUCAN Take 1 tablet PO once. Repeat in 3 days if needed.   fluticasone 50 MCG/ACT nasal spray Commonly known as: FLONASE Place 2 sprays into both nostrils daily. Started by: Birder Robson   FreeStyle Libre 3 Sensor Misc Place 1 sensor on the skin every 14 days. Use to check glucose continuously   Lantus SoloStar 100 UNIT/ML Solostar Pen Generic drug: insulin glargine Inject 30 Units into the skin at bedtime.   Ozempic (0.25 or 0.5 MG/DOSE) 2 MG/3ML Sopn Generic drug: Semaglutide(0.25 or 0.5MG /DOS) Inject 0.5 mg into the skin once a week. Stop Victoza.         REVIEW OF SYSTEMS: Pertinent positives and negatives discussed in HPI.   Objective:   Physical Exam: BP 116/80   Pulse 97   Temp (!) 97.3 F (36.3 C) (Temporal)   Resp 16   Ht 5' 4.76" (1.645 m)   Wt 232 lb 6.4 oz (105.4 kg)   SpO2 97%   BMI 38.96 kg/m  Body mass index is 38.96 kg/m. GEN: alert, well developed HEENT: clear conjunctiva, TM grey and translucent, nose with + inferior turbinate hypertrophy, pink nasal mucosa, slight clear rhinorrhea, no cobblestoning HEART: regular rate and rhythm, no murmur LUNGS: clear to auscultation bilaterally, no coughing, unlabored respiration ABDOMEN: soft, non distended  SKIN: no rashes or lesions  Reviewed:  10/05/2022: followed by [facility] pharmacists for Type II DM, long standing, uncontrolled.  Stopped Victoza, started Ozempic.  On Lantus.   05/02/2022: seen by Dr Jens Som cardiology for syncope/dizzy spells.  Thought to be vagal.  Normal EKG.   04/26/2021: seen by [facility] for sore throat, congestion, pressure in sinuses, dry cough.  Discussed likely  sinusitis. Started on Flonase and Azithromycin.    Skin Testing:  Skin prick testing was placed, which includes aeroallergens/foods, histamine control, and saline control.  Verbal consent was obtained prior to placing test.  Patient tolerated procedure well.  Allergy testing results were read and interpreted by myself, documented by clinical staff. Adequate positive and negative control.  Results discussed with patient/family.  Airborne Adult Perc - 10/31/22 0952     Time Antigen Placed 0957    Allergen Manufacturer Waynette Buttery    Location Back    Number of Test 55    1. Control-Buffer 50% Glycerol Negative    2. Control-Histamine 3+    3. Bahia 2+    4. [place] 3+    5. Johnson 3+    6. Kentucky Blue Negative    7. Meadow Fescue Negative    8. Perennial Rye Negative    9. Timothy Negative    10. Ragweed Mix Negative    11. Cocklebur Negative  12. Plantain,  English Negative    13. Baccharis Negative    14. Dog Fennel Negative    15. Guernsey Thistle 2+    16. Lamb's Quarters Negative    17. Sheep Sorrell Negative    18. Rough Pigweed 2+    19. Marsh Elder, Rough Negative    20. Mugwort, Common Negative    21. Box, Elder Negative    22. Cedar, red Negative    23. Sweet Gum Negative    24. Pecan Pollen Negative    25. Pine Mix Negative    26. Walnut, Black Pollen Negative    27. Red Mulberry 2+    28. Ash Mix 3+    29. Birch Mix Negative    30. Beech American Negative    31. Cottonwood, Guinea-Bissau Negative    32. Hickory, White 2+    33. Maple Mix Negative    34. Oak, Guinea[place] Mix Negative    35. Sycamore Eastern Negative    36. Alternaria Alternata Negative    37. Cladosporium Herbarum Negative    38. Aspergillus Mix Negative    39. Penicillium Mix Negative    40. Bipolaris Sorokiniana (Helminthosporium) Negative    41. Drechslera Spicifera (Curvularia) Negative    42. Mucor Plumbeus Negative    43. Fusarium Moniliforme Negative    44. Aureobasidium Pullulans  (pullulara) Negative    45. Rhizopus Oryzae Negative    46. Botrytis Cinera Negative    47. Epicoccum Nigrum Negative    48. Phoma Betae Negative    49. Dust Mite Mix 3+    50. Cat Hair 10,000 BAU/ml 2+    51.  Dog Epithelia Negative    52. Mixed Feathers Negative    53. Horse Epithelia Negative    54. Cockroach, German 3+    55. Tobacco Leaf Negative             13 Food Perc - 10/31/22 0952       Test Information   Time Antigen Placed 0957    Allergen Manufacturer Waynette Buttery    Location Back    Number of allergen test 13      Food   1. Peanut Negative    2. Soybean Negative    3. Wheat Negative    4. Sesame Negative    5. Milk, Cow Negative    6. Casein Negative    7. Egg White, Chicken Negative    8. Shellfish Mix Negative    9. Fish Mix Negative    10. Cashew Negative    11. Walnut Food Negative    12. Almond Negative    13. Hazelnut Negative               Assessment:   1. Acute urticaria   2. Pollen-food allergy, initial encounter   3. Seasonal allergic rhinitis due to pollen   4. Allergic rhinitis due to insect   5. Allergic rhinitis due to animal hair or dander   6. Upper respiratory tract hypersensitivity reaction     Plan/Recommendations:  Reactions to Fragrances - Avoid exposure to fragrances.  This is a common irritant reaction.  History of Acute Urticaria - At this time etiology of hives is unknown. Hives can be caused by a variety of different triggers including illness/infection, exercise, pressure, vibrations, extremes of temperature to name a few however majority of the time there is no identifiable trigger.  - Please let us know if this recurs.   Oral Pruritus-Pollen Food  Allergy Syndrome: - Negative SPT 10/2022 to common allergenic foods.  - These symptoms are typically not life-threatening and are because of a cross reaction between a pollen you are allergic to, and to a protein in specific foods (such as fresh fruits, vegetables, and  nuts). - If you can eat these things and tolerate the symptoms, it is fine to continue to do so.  If not, you may avoid these fresh fruits and vegetables.   - Heating these foods, buying them canned, and peeling these foods should allow them to be consumed without symptoms or with less symptoms.  Allergic Rhinitis: - Due to turbinate hypertrophy, seasonal symptoms and unresponsive to over the counter meds, performed skin testing to identify aeroallergen triggers.   - Positive skin test 10/2022: trees, grasses, weeds, dust mites, cats, cockroach.  - Avoidance measures discussed. - Use nasal saline rinses before nose sprays such as with Neilmed Sinus Rinse.  Use distilled water.   - Use Flonase 2 sprays each nostril daily. Aim upward and outward. - Use Azelastine 1-2 sprays each nostril twice daily as needed for runny nose, drainage, sneezing, congestion. Aim upward and outward. - Use Zyrtec 10 mg daily as needed for runny nose, sneezing, itchy watery eyes..  - Consider allergy shots as long term control of your symptoms by teaching your immune system to be more tolerant of your allergy triggers  ALLERGEN AVOIDANCE MEASURES   Cockroach Limit spread of food around the house; especially keep food out of bedrooms. Keep food and garbage in closed containers with a tight lid.  Never leave food out in the kitchen.  Do not leave out pet food or dirty food bowls. Mop the kitchen floor and wash countertops at least once a week. Repair leaky pipes and faucets so there is no standing water to attract roaches. Plug up cracks in the house through which cockroaches can enter. Use bait stations and approved pesticides to reduce cockroach infestation. Pollen Avoidance Pollen levels are highest during the mid-day and afternoon.  Consider this when planning outdoor activities. Avoid being outside when the grass is being mowed, or wear a mask if the pollen-allergic person must be the one to mow the grass. Keep  the windows closed to keep pollen outside of the home. Use an air conditioner to filter the air. Take a shower, wash hair, and change clothing after working or playing outdoors during pollen season. Pet Dander Keep the pet out of your bedroom and restrict it to only a few rooms. Be advised that keeping the pet in only one room will not limit the allergens to that room. Don't pet, hug or kiss the pet; if you do, wash your hands with soap and water. High-efficiency particulate air (HEPA) cleaners run continuously in a bedroom or living room can reduce allergen levels over time. Regular use of a high-efficiency vacuum cleaner or a central vacuum can reduce allergen levels. Giving your pet a bath at least once a week can reduce airborne allergen.    Return in about 6 weeks (around 12/12/2022).  Alesia Morin, MD [facility]

## 2022-11-02 ENCOUNTER — Other Ambulatory Visit: Payer: Self-pay

## 2022-11-06 ENCOUNTER — Other Ambulatory Visit: Payer: Self-pay

## 2022-11-09 ENCOUNTER — Other Ambulatory Visit: Payer: Self-pay

## 2022-11-09 ENCOUNTER — Ambulatory Visit: Payer: MEDICAID | Attending: Family Medicine | Admitting: Pharmacist

## 2022-11-09 DIAGNOSIS — Z794 Long term (current) use of insulin: Secondary | ICD-10-CM | POA: Diagnosis not present

## 2022-11-09 DIAGNOSIS — E1165 Type 2 diabetes mellitus with hyperglycemia: Secondary | ICD-10-CM | POA: Diagnosis not present

## 2022-11-09 DIAGNOSIS — Z7985 Long-term (current) use of injectable non-insulin antidiabetic drugs: Secondary | ICD-10-CM | POA: Diagnosis not present

## 2022-11-09 MED ORDER — SEMAGLUTIDE (1 MG/DOSE) 4 MG/3ML ~~LOC~~ SOPN
1.0000 mg | PEN_INJECTOR | SUBCUTANEOUS | 2 refills | Status: DC
Start: 2022-11-09 — End: 2023-01-15
  Filled 2022-11-09 – 2022-12-12 (×3): qty 3, 28d supply, fill #0
  Filled 2023-01-08: qty 3, 28d supply, fill #1

## 2022-11-09 NOTE — Progress Notes (Signed)
    S:     PCP: Dr. Andrey Campanile  Patient arrives in good spirits. Presents for diabetes evaluation, education, and management. Patient was referred and last seen by Primary Care Provider on 09/29/2022. I saw her on 10/05/2022. We changed her Victoza to Ozempic and started CGM with Libre 3.   Today, pt reports initial NV, diarrhea with Ozempic. This resolved after the first week. She'll occasionally feel nausea the day after injection. She is taking Lantus 30u at night and endorses some lows occurring ~4 AM (gives readings in the 50s). She also reports that her sugars are more elevated overnight. CGM data summarized below.    Family/Social History: -FHx: Diabetes (MGP) -Tobacco user: never  Insurance coverage/medication affordability: [employer]  Current diabetes medications include: Lantus 30 units at bedtime, Ozempic 0.5mg  weekly Current hyperlipidemia medications include: none  Patient denies hypoglycemic events.  Patient reported dietary habits: Eats 4 tortillas (down from 8 daily), rice, fruits, not a big of vegetables, occasionally eats red meats; drinks water, rarely drinks soda, sometimes juice  Patient-reported exercise habits: not currently    Patient denies nocturia (nighttime urination).  Patient denies neuropathy (nerve pain). Patient reports some blurred vision.  Patient denies visual changes.  O: Report from [employer]:  Date of Download: 28-day report from 9/4-10/02/2022 % Time CGM is active: 61% Average Glucose: 168 mg/dL Glucose Management Indicator: 7.3  Glucose Variability: 26.1 (goal <36%) Time in Goal:  - Time in range 70-180: 61% - Time above range: 39% - Time below range: 0% Observed patterns: -Most highs occur overnight into the early morning   Lab Results  Component Value Date   HGBA1C 11.0 (A) 09/29/2022   There were no vitals filed for this visit.  Lipid Panel     Component Value Date/Time   CHOL 194 08/06/2020 1145   TRIG 188 (H) 08/06/2020 1145    HDL 49 08/06/2020 1145   CHOLHDL 4.0 08/06/2020 1145   LDLCALC 112 (H) 08/06/2020 1145   Clinical Atherosclerotic Cardiovascular Disease (ASCVD): No  The ASCVD Risk score (Arnett DK, et al., 2019) failed to calculate for the following reasons:   The 2019 ASCVD risk score is only valid for ages 20 to 1   A/P: Diabetes longstanding currently uncontrolled with A1C 11.0%, however, CGM data reveals improvement. She is having some issues with the meter falling off (hence only 61% activity). She has covers coming in the mail. Patient endorses medication adherence. Based on her report, the Ozempic seems to be covering her better than Victoza. We will titrate and see how she tolerates the 1mg  weekly dose. If GI symptoms return or worsen we will need to stay with the 0.5mg  dose.  -Increase Ozempic to 1mg  weekly.  -Continue Lantus 30u daily.  -Extensively discussed pathophysiology of diabetes, recommended lifestyle interventions, dietary effects on blood sugar control -Counseled on s/sx of and management of hypoglycemia -Next A1C anticipated 12/2022.   Written patient instructions provided. Total time in face to face counseling 25 minutes.   Follow up Pharmacist Clinic Visit in 4 weeks.     Butch Penny, PharmD, Patsy [place], CPP Clinical Pharmacist [facility] [phone]

## 2022-11-15 ENCOUNTER — Encounter: Payer: Self-pay | Admitting: Family Medicine

## 2022-11-21 ENCOUNTER — Other Ambulatory Visit: Payer: Self-pay

## 2022-11-22 ENCOUNTER — Encounter (HOSPITAL_COMMUNITY): Payer: Self-pay

## 2022-11-22 ENCOUNTER — Other Ambulatory Visit: Payer: Self-pay

## 2022-11-22 ENCOUNTER — Ambulatory Visit (HOSPITAL_COMMUNITY)
Admission: EM | Admit: 2022-11-22 | Discharge: 2022-11-22 | Disposition: A | Discharge status: Home / Self Care | Payer: MEDICAID | Attending: Physician Assistant | Admitting: Physician Assistant

## 2022-11-22 DIAGNOSIS — R112 Nausea with vomiting, unspecified: Secondary | ICD-10-CM

## 2022-11-22 LAB — POCT URINALYSIS DIP (MANUAL ENTRY)
Bilirubin, UA: NEGATIVE
Blood, UA: NEGATIVE
Glucose, UA: >=1000 mg/dL — AB
Ketones, POC UA: NEGATIVE mg/dL
Leukocytes, UA: NEGATIVE
Nitrite, UA: NEGATIVE
Protein Ur, POC: NEGATIVE mg/dL
Spec Grav, UA: 1.015 (ref 1.010–1.025)
Urobilinogen, UA: 0.2 U/dL
pH, UA: 6 (ref 5.0–8.0)

## 2022-11-22 LAB — POCT URINE PREGNANCY: Preg Test, Ur: NEGATIVE

## 2022-11-22 MED ORDER — ONDANSETRON 4 MG PO TBDP: 4.0000 mg | ORAL_TABLET | Freq: Three times a day (TID) | ORAL | 0 refills | Status: DC | PRN

## 2022-11-22 NOTE — ED Provider Notes (Signed)
[facility]    CSN: [id number] Arrival date & time: 11/22/22  1717      History   Chief Complaint Chief Complaint  Patient presents with   Dizziness   Emesis    HPI Melissa Gilmore is a 27 y.o. female.   Patient reports she has had vomiting and nausea on and off for the past week.  Patient reports she has not had any diarrhea as she has not had any abdominal pain.  Patient reports that she experiences some dizziness and then will have nausea or have vomiting.  Patient had a negative pregnancy test.  Patient denies having any diarrhea she has not had been exposed to anyone who was sick.   Dizziness Associated symptoms: vomiting   Emesis   Past Medical History:  Diagnosis Date   Anxiety    Childhood asthma    Depression    Major Depression disorder   Diabetes mellitus without complication (HCC)    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HCC)    Headache    Migraines   Schizophrenia (HCC)    Urticaria     Patient Active Problem List   Diagnosis Date Noted   Lumbar radiculopathy 06/06/2022   Syncope 03/23/2022   Low back pain 03/19/2022   Acute lumbar back pain 03/18/2022   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/2021   Schizophrenia (HCC) 07/17/2021   Depression 07/17/2021   Insulin-requiring or dependent type II diabetes mellitus (HCC) 07/17/2021   Body mass index (BMI) 35.0-35.9, adult 07/17/2021   Dyslipidemia 07/17/2021   History of abnormal uterine bleeding 07/17/2021   Acute cholecystitis with chronic cholecystitis 07/16/2021    Past Surgical History:  Procedure Laterality Date   CHOLECYSTECTOMY N/A 07/17/2021   Procedure: SINGLE SITE LAPAROSCOPIC CHOLECYSTECTOMY WITH INTRAOPERATIVE CHOLANGIOGRAM; LIVER BIOPSY;  Surgeon: Karie Soda, MD;  Location: [facility];  Service: General;  Laterality: N/A;   IR EPIDUROGRAPHY  03/21/2022   LUMBAR LAMINECTOMY/ DECOMPRESSION WITH MET-RX Right 06/06/2022   Procedure: Right Lumbar three-four Minimally  Invasive Surgery Laminectomy/Discectomy;  Surgeon: Jadene Pierini, MD;  Location: [facility] OR;  Service: Neurosurgery;  Laterality: Right;   WISDOM TOOTH EXTRACTION      OB History   No obstetric history on file.      Home Medications    Prior to Admission medications   Medication Sig Start Date End Date Taking? Authorizing Provider  ondansetron (ZOFRAN-ODT) 4 MG disintegrating tablet Take 1 tablet (4 mg total) by mouth every 8 (eight) hours as needed for nausea or vomiting. 11/22/22  Yes Cheron Schaumann K, PA-C  Accu-Chek Softclix Lancets lancets Use to check blood sugar twice daily. Dx E11.65 05/10/22   Georganna Skeans, MD  azelastine (ASTELIN) 0.1 % nasal spray Place 1 spray into both nostrils 2 (two) times daily as needed. Use in each nostril as directed 10/31/22   Birder Robson, MD  Blood Glucose Monitoring Suppl (ACCU-CHEK GUIDE ME) w/Device KIT Use to check blood sugar twice daily. Dx E11.65 Patient not taking: Reported on 10/31/2022 05/10/22   Georganna Skeans, MD  cetirizine (ZYRTEC ALLERGY) 10 MG tablet Take 1 tablet (10 mg total) by mouth daily as needed for allergies. 10/31/22   Birder Robson, MD  Continuous Glucose Sensor (FREESTYLE LIBRE 3 SENSOR) MISC Place 1 sensor on the skin every 14 days. Use to check glucose continuously 10/05/22   Hoy Register, MD  fluconazole (DIFLUCAN) 150 MG tablet Take 1 tablet PO once. Repeat in 3 days if needed. Patient not taking: Reported  on 08/31/2022 07/11/22   Waldon Merl, PA-C  fluticasone [facility]) 50 MCG/ACT nasal spray Place 2 sprays into both nostrils daily. 10/31/22   Birder Robson, MD  glucose blood (ACCU-CHEK GUIDE) test strip Use to check blood sugar twice daily. Dx E11.65 05/10/22   Georganna Skeans, MD  insulin glargine (LANTUS SOLOSTAR) 100 UNIT/ML Solostar Pen Inject 30 Units into the skin at bedtime. 10/05/22   Hoy Register, MD  Insulin Pen Needle (B-D UF III MINI PEN NEEDLES) 31G X 5 MM MISC Use as instructed. Inject into the skin once  daily 05/10/22   Georganna Skeans, MD  Semaglutide, 1 MG/DOSE, 4 MG/3ML SOPN Inject 1 mg as directed once a week. 11/09/22   Hoy Register, MD    Family History Family History  Problem Relation Age of Onset   Allergic rhinitis Father    Diabetes Maternal Grandmother    Stomach cancer Maternal Grandmother     Social History Social History   Tobacco Use   Smoking status: Never    Passive exposure: Never   Smokeless tobacco: Never  Vaping Use   Vaping status: Never Used  Substance Use Topics   Alcohol use: Yes    Comment: Occasional- events   Drug use: Never     Allergies   Patient has no known allergies.   Review of Systems Review of Systems  Gastrointestinal:  Positive for vomiting.  Neurological:  Positive for dizziness.  All other systems reviewed and are negative.    Physical Exam Triage Vital Signs ED Triage Vitals  Encounter Vitals Group     BP 11/22/22 1755 (!) 119/90     Systolic BP Percentile --      Diastolic BP Percentile --      Pulse Rate 11/22/22 1755 67     Resp 11/22/22 1755 16     Temp 11/22/22 1755 98.1 F (36.7 C)     Temp Source 11/22/22 1755 Oral     SpO2 11/22/22 1755 98 %     Weight --      Height --      Head Circumference --      Peak Flow --      Pain Score 11/22/22 1757 0     Pain Loc --      Pain Education --      Exclude from Growth Chart --    No data found.  Updated Vital Signs BP (!) 119/90 (BP Location: Right Arm)   Pulse 67   Temp 98.1 F (36.7 C) (Oral)   Resp 16   LMP 11/12/2022 (Exact Date)   SpO2 98%   Visual Acuity Right Eye Distance:   Left Eye Distance:   Bilateral Distance:    Right Eye Near:   Left Eye Near:    Bilateral Near:     Physical Exam Vitals and nursing note reviewed.  Constitutional:      Appearance: She is well-developed.  HENT:     Head: Normocephalic.     Mouth/Throat:     Mouth: Mucous membranes are moist.  Cardiovascular:     Rate and Rhythm: Normal rate.  Pulmonary:      Effort: Pulmonary effort is normal.  Abdominal:     General: There is no distension.  Musculoskeletal:        General: Normal range of motion.     Cervical back: Normal range of motion.  Skin:    General: Skin is warm.  Neurological:     General:  No focal deficit present.     Mental Status: She is alert and oriented to person, place, and time.  Psychiatric:        Mood and Affect: Mood normal.      UC Treatments / Results  Labs (all labs ordered are listed, but only abnormal results are displayed) Labs Reviewed  POCT URINALYSIS DIP (MANUAL ENTRY) - Abnormal; Notable for the following components:      Result Value   Glucose, UA >=1,000 (*)    All other components within normal limits  POCT URINE PREGNANCY    EKG   Radiology No results found.  Procedures Procedures (including critical care time)  Medications Ordered in UC Medications - No data to display  Initial Impression / Assessment and Plan / UC Course  I have reviewed the triage vital signs and the nursing notes.  Pertinent labs & imaging results that were available during my care of the patient were reviewed by me and considered in my medical decision making (see chart for details).      Final Clinical Impressions(s) / UC Diagnoses   Final diagnoses:  Nausea and vomiting, unspecified vomiting type     Discharge Instructions      Return if any problems.    ED Prescriptions     Medication Sig Dispense Auth. Provider   ondansetron (ZOFRAN-ODT) 4 MG disintegrating tablet Take 1 tablet (4 mg total) by mouth every 8 (eight) hours as needed for nausea or vomiting. 15 tablet Elson Areas, [place]      PDMP not reviewed this encounter. An After Visit Summary was printed and given to the patient.       Elson Areas, [place] 11/22/22 2023

## 2022-11-22 NOTE — Discharge Instructions (Addendum)
Return if any problems.

## 2022-11-22 NOTE — ED Triage Notes (Signed)
Patient reports that she has had random vomiting and dizziness x 1 week   Patient reports that she is taking Pepto Bismol.

## 2022-12-04 ENCOUNTER — Other Ambulatory Visit: Payer: Self-pay

## 2022-12-12 ENCOUNTER — Other Ambulatory Visit (HOSPITAL_COMMUNITY): Payer: Self-pay

## 2022-12-12 ENCOUNTER — Other Ambulatory Visit: Payer: Self-pay

## 2022-12-12 ENCOUNTER — Ambulatory Visit (INDEPENDENT_AMBULATORY_CARE_PROVIDER_SITE_OTHER): Payer: MEDICAID | Admitting: Family Medicine

## 2022-12-12 ENCOUNTER — Other Ambulatory Visit: Payer: Self-pay | Admitting: Family Medicine

## 2022-12-12 ENCOUNTER — Ambulatory Visit: Payer: MEDICAID | Admitting: Internal Medicine

## 2022-12-12 VITALS — BP 126/85 | HR 95 | Temp 98.7°F | Resp 18 | Ht 64.0 in | Wt 237.6 lb

## 2022-12-12 DIAGNOSIS — R42 Dizziness and giddiness: Secondary | ICD-10-CM

## 2022-12-12 DIAGNOSIS — Z6841 Body Mass Index (BMI) 40.0 and over, adult: Secondary | ICD-10-CM

## 2022-12-12 DIAGNOSIS — N914 Secondary oligomenorrhea: Secondary | ICD-10-CM | POA: Diagnosis not present

## 2022-12-12 DIAGNOSIS — Z23 Encounter for immunization: Secondary | ICD-10-CM

## 2022-12-12 DIAGNOSIS — E1165 Type 2 diabetes mellitus with hyperglycemia: Secondary | ICD-10-CM

## 2022-12-12 DIAGNOSIS — E66813 Obesity, class 3: Secondary | ICD-10-CM | POA: Diagnosis not present

## 2022-12-12 DIAGNOSIS — Z3202 Encounter for pregnancy test, result negative: Secondary | ICD-10-CM | POA: Diagnosis not present

## 2022-12-12 DIAGNOSIS — Z7985 Long-term (current) use of injectable non-insulin antidiabetic drugs: Secondary | ICD-10-CM

## 2022-12-12 LAB — POCT URINE PREGNANCY: Preg Test, Ur: NEGATIVE

## 2022-12-12 MED ORDER — LANTUS SOLOSTAR 100 UNIT/ML ~~LOC~~ SOPN
30.0000 [IU] | PEN_INJECTOR | Freq: Every day | SUBCUTANEOUS | 0 refills | Status: DC
  Filled 2022-12-12: qty 15, 50d supply, fill #0

## 2022-12-12 MED ORDER — MECLIZINE HCL 25 MG PO TABS
25.0000 mg | ORAL_TABLET | Freq: Three times a day (TID) | ORAL | 1 refills | Status: DC | PRN
  Filled 2022-12-12: qty 30, 10d supply, fill #0
  Filled 2023-01-08: qty 30, 10d supply, fill #1

## 2022-12-13 ENCOUNTER — Ambulatory Visit (INDEPENDENT_AMBULATORY_CARE_PROVIDER_SITE_OTHER): Payer: MEDICAID | Admitting: Internal Medicine

## 2022-12-13 ENCOUNTER — Other Ambulatory Visit (HOSPITAL_COMMUNITY): Payer: Self-pay

## 2022-12-13 ENCOUNTER — Encounter: Payer: Self-pay | Admitting: Internal Medicine

## 2022-12-13 ENCOUNTER — Other Ambulatory Visit: Payer: Self-pay

## 2022-12-13 VITALS — BP 110/70 | HR 76 | Temp 98.0°F | Resp 16 | Wt 237.2 lb

## 2022-12-13 DIAGNOSIS — J3089 Other allergic rhinitis: Secondary | ICD-10-CM

## 2022-12-13 DIAGNOSIS — Z872 Personal history of diseases of the skin and subcutaneous tissue: Secondary | ICD-10-CM | POA: Diagnosis not present

## 2022-12-13 DIAGNOSIS — J302 Other seasonal allergic rhinitis: Secondary | ICD-10-CM | POA: Diagnosis not present

## 2022-12-13 DIAGNOSIS — T781XXD Other adverse food reactions, not elsewhere classified, subsequent encounter: Secondary | ICD-10-CM

## 2022-12-13 LAB — CMP14+EGFR
ALT: 14 [IU]/L (ref 0–32)
AST: 15 [IU]/L (ref 0–40)
Albumin: 4.4 g/dL (ref 4.0–5.0)
Alkaline Phosphatase: 115 [IU]/L (ref 44–121)
BUN/Creatinine Ratio: 14 (ref 9–23)
BUN: 10 mg/dL (ref 6–20)
Bilirubin Total: 0.3 mg/dL (ref 0.0–1.2)
CO2: 21 mmol/L (ref 20–29)
Calcium: 9.3 mg/dL (ref 8.7–10.2)
Chloride: 99 mmol/L (ref 96–106)
Creatinine, Ser: 0.69 mg/dL (ref 0.57–1.00)
Globulin, Total: 2.6 g/dL (ref 1.5–4.5)
Glucose: 394 mg/dL — ABNORMAL HIGH (ref 70–99)
Potassium: 4.2 mmol/L (ref 3.5–5.2)
Sodium: 136 mmol/L (ref 134–144)
Total Protein: 7 g/dL (ref 6.0–8.5)
eGFR: 123 mL/min/{1.73_m2} (ref >59)

## 2022-12-13 LAB — CBC WITH DIFFERENTIAL/PLATELET
Basophils Absolute: 0 10*3/uL (ref 0.0–0.2)
Basos: 0 %
EOS (ABSOLUTE): 0.1 10*3/uL (ref 0.0–0.4)
Eos: 1 %
Hematocrit: 42.7 % (ref 34.0–46.6)
Hemoglobin: 14.1 g/dL (ref 11.1–15.9)
Immature Grans (Abs): 0 10*3/uL (ref 0.0–0.1)
Immature Granulocytes: 0 %
Lymphocytes Absolute: 3.1 10*3/uL (ref 0.7–3.1)
Lymphs: 31 %
MCH: 28.7 pg (ref 26.6–33.0)
MCHC: 33 g/dL (ref 31.5–35.7)
MCV: 87 fL (ref 79–97)
Monocytes Absolute: 0.5 10*3/uL (ref 0.1–0.9)
Monocytes: 5 %
Neutrophils Absolute: 6.2 10*3/uL (ref 1.4–7.0)
Neutrophils: 63 %
Platelets: 315 10*3/uL (ref 150–450)
RBC: 4.92 x10E6/uL (ref 3.77–5.28)
RDW: 13.4 % (ref 11.7–15.4)
WBC: 9.9 10*3/uL (ref 3.4–10.8)

## 2022-12-13 MED ORDER — AZELASTINE HCL 0.1 % NA SOLN
1.0000 | Freq: Two times a day (BID) | NASAL | 5 refills | Status: DC | PRN
  Filled 2022-12-13: qty 30, 50d supply, fill #0
  Filled 2023-01-08: qty 30, 50d supply, fill #1

## 2022-12-13 MED ORDER — FLUTICASONE PROPIONATE 50 MCG/ACT NA SUSP
2.0000 | Freq: Every day | NASAL | 5 refills | Status: DC
  Filled 2022-12-13: qty 16, 30d supply, fill #0
  Filled 2023-01-08: qty 16, 30d supply, fill #1

## 2022-12-13 MED ORDER — CETIRIZINE HCL 10 MG PO TABS
10.0000 mg | ORAL_TABLET | Freq: Every day | ORAL | 5 refills | Status: DC | PRN
  Filled 2022-12-13: qty 30, 30d supply, fill #0
  Filled 2023-01-08: qty 30, 30d supply, fill #1

## 2022-12-13 NOTE — Progress Notes (Signed)
FOLLOW UP Date of Service/Encounter:  12/13/22   Subjective:  Melissa Gilmore (DOB: 03/29/1995) is a 27 y.o. female who [facility] for follow up for hx of acute urticaria, pollen food allergy syndrome and allergic rhinitis.   History obtained from: chart review and patient. Last visit was on 10/31/2022 and at the time was SPT positive to multiple allergens, started on Flonase, Azelastine/Zyrtec PRN.  Also SPT to commonly allergenic foods was negative, discussed likely has PFAS as she reported mouth itching with random foods, couldn't find a clear trigger.  No hives since last visit. Still sometimes gets mouth itching but not sure what foods.  No swelling.  Allergies are doing a lot better.  She does have cats at home but usually washes her clothes and hands after exposure or takes a Zyrtec.  Also taking Flonase daily.  This has helped reduce her congestion, drainage, runny nose.  Has not needed Azelastine much.   Past Medical History: Past Medical History:  Diagnosis Date   Anxiety    Childhood asthma    Depression    Major Depression disorder   Diabetes mellitus without complication (HCC)    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HCC)    Headache    Migraines   Schizophrenia (HCC)    Urticaria     Objective:  BP 110/70   Pulse 76   Temp 98 F (36.7 C) (Temporal)   Resp 16   Wt 237 lb 3.2 oz (107.6 kg)   LMP 11/12/2022 (Exact Date)   SpO2 98%   BMI 40.72 kg/m  Body mass index is 40.72 kg/m. Physical Exam: GEN: alert, well developed HEENT: clear conjunctiva, TM grey and translucent, nose with mild inferior turbinate hypertrophy, pink nasal mucosa, no rhinorrhea, no cobblestoning HEART: regular rate and rhythm, no murmur LUNGS: clear to auscultation bilaterally, no coughing, unlabored respiration SKIN: no rashes or lesions  Assessment:   1. Seasonal and perennial allergic rhinitis   2. Pollen-food allergy, subsequent  encounter   3. History of urticaria     Plan/Recommendations:  History of Acute Urticaria - At this time etiology of hives is unknown. Hives can be caused by a variety of different triggers including illness/infection, exercise, pressure, vibrations, extremes of temperature to name a few however majority of the time there is no identifiable trigger.  - Please let us know if this recurs.   Oral Pruritus-Pollen Food Allergy Syndrome: - Negative SPT 10/2022 to common allergenic foods.  - These symptoms are typically not life-threatening and are because of a cross reaction between a pollen you are allergic to, and to a protein in specific foods (such as fresh fruits, vegetables, and nuts). - If you can eat these things and tolerate the symptoms, it is fine to continue to do so.  If not, you may avoid these fresh fruits and vegetables.   - Heating these foods, buying them canned, and peeling these foods should allow them to be consumed without symptoms or with less symptoms.  Allergic Rhinitis: - Controlled with medical management.  - Positive skin test 10/2022: trees, grasses, weeds, dust mites, cats, cockroach.  - Avoidance measures discussed. - Use nasal saline rinses before nose sprays such as with Neilmed Sinus Rinse.  Use distilled water.   - Use Flonase 2 sprays each nostril daily. Aim upward and outward. - Use Azelastine 1-2 sprays each nostril twice daily as needed for runny nose, drainage, sneezing, congestion. Aim upward and outward. -  Use Zyrtec 10 mg daily as needed for runny nose, sneezing, itchy watery eyes..  - Consider allergy shots as long term control of your symptoms by teaching your immune system to be more tolerant of your allergy triggers    Return in about 4 months (around 04/12/2023).  Alesia Morin, MD [facility]

## 2022-12-13 NOTE — Progress Notes (Signed)
S:    PCP: Dr. Andrey Campanile PMH: T2DM (dx 2018), HLD, obesity  Patient arrives in good spirits. Presents for diabetes evaluation, education, and management. Patient was referred Primary Care Provider on 09/29/2022. Last seen by PCP on 12/12/22 for vertigo. Last seen by pharmacist on 11/09/2022. Patient was instructed to increase her Ozempic to 1mg  at that visit and switch Lantus from bedtime to AM. Of note, patient presented to the ED on 11/22/22 with episodic N/V and dizziness without diarrhea or abdominal pain.  Today, patient reports some financial difficulties over the past month. Because of this, she went without Ozempic between 10/14 to first dose of 1mg  yesterday (11/6). As her Josephine Igo falls off often, she has not been wearing it. Had to wait to purchase over patches for her Josephine Igo due to her financial situation, but these will be coming in the mail next week.   Patient notes no N/V since receiving 1mg  Ozempic dose yesterday. She does note a consistent nausea and dizziness, which is why she went to the ED on 10/16. Does not believe this to due to the Ozempic as it still occurred while she was off Ozempic for 2 weeks. She also states that N/V related to Ozempic had already resolved once this new N/V presented.  Patient will miss her Lantus dose 2-3x/week as it has been a bit difficult to remember to take it in the morning, rather than with her usual routine at bedtime. Notes that if she recognizes later in the day that she missed her AM Lantus dose, she will take that dose then (even if it's at bedtime), then still take her AM Lantus dose the next morning. She states that these are the days where she feels her BGs going too low and because she feels bad that day, she will skip her Lantus dose the next AM.  Family/Social History: -FHx: Diabetes (MGP) -Tobacco user: never  Insurance coverage/medication affordability: [employer]  Current diabetes medications: Lantus 30 units daily (tries to takes in  AM) Ozempic 1mg  weekly - administered first dose 11/6 Previous diabetes medications: Metformin IR (GI upset) Victoza (ineffective) Current hyperlipidemia medications: none  Patient reports symptoms of hypoglycemic events. Noted it's hard to explain how she feels, other than that her body just feels off. - Typically happens 1-2x/day, usually in the afternoon and at 3-4am - Does not check BG when she is having these symptoms as she is not typically at home at time of symptoms - Notes she knows how to correct it.  Patient denies nocturia (nighttime urination).  Patient reports neuropathy (nerve pain) in fingers. Patient reports some blurred vision.  Patient reports fatigue.  Patient reported dietary habits: Rice, fruits, not a big of vegetables, occasionally eats red meats; drinks water, rarely drinks soda, sometimes juice - Limiting carb intake/tortillas since last visit - May drink sugary beverage over span of 7am - 2pm if her fiance brings her one home. Usually shares this drink with her sister  Patient-reported exercise habits: not currently  - Back surgery in February has limited her exercise as she used to walk several times a week for 30 minutes - 2 hours, but she is trying to start walking that much again  Checks BG QAM and at night after dinner FBG: 250s At night 2hrs after dinner: 250s  O: No new CGM data to report on.  Report from 11/09/22 visit:  Date of Download: 28-day report from 9/4-10/02/2022 % Time CGM is active: 61% Average Glucose: 168 mg/dL Glucose  Management Indicator: 7.3  Glucose Variability: 26.1 (goal <36%) Time in Goal:  - Time in range 70-180: 61% - Time above range: 39% - Time below range: 0% Observed patterns: -Most highs occur overnight into the early morning   Lab Results  Component Value Date   HGBA1C 11.0 (A) 09/29/2022   There were no vitals filed for this visit.  Lipid Panel     Component Value Date/Time   CHOL 194 08/06/2020 1145    TRIG 188 (H) 08/06/2020 1145   HDL 49 08/06/2020 1145   CHOLHDL 4.0 08/06/2020 1145   LDLCALC 112 (H) 08/06/2020 1145   Clinical Atherosclerotic Cardiovascular Disease (ASCVD): No  The ASCVD Risk score (Arnett DK, et al., 2019) failed to calculate for the following reasons:   The 2019 ASCVD risk score is only valid for ages 21 to 56   A/P: Diabetes longstanding currently uncontrolled with A1C of 11%. Self-reported CBGs are consistent with current A1c, and therefore appear to have not improved from previous visit. This new, consistent nausea and dizziness is likely not attributed to the Ozempic as it still occurred while she was off the medicine. As patient has only received one dose of Ozempic 1mg  and did not have N/V, will have patient continue this dose and see how it impacts her BGs. Will also not adjust Lantus at this time as it has not been given consistently. Advised patient to switch back to taking Lantus at bedtime to help improve adherence. Patient voices understanding. As for the reported hypoglycemia symptoms, this is likely relative hypoglycemia given her BGs are usually in the 250s. Plan to reassess this further once we have Libre data at the next visit. -Continue Ozempic 1mg  weekly -Continue Lantus 30u daily, but switch from the mornings to at bedtime -F/u updated lipid panel -Extensively discussed pathophysiology of diabetes, recommended lifestyle interventions, dietary effects on blood sugar control -Counseled on s/sx of and management of hypoglycemia -Next A1C anticipated at PCP visit at end of 12/2022  Written patient instructions provided. Total time in face to face counseling 30 minutes.    Follow up Pharmacist: 01/15/23 PCP Visit: 12/29/22  Nicole Kindred, PharmD PGY1 Pharmacy Resident 12/14/2022 7:55 PM

## 2022-12-13 NOTE — Patient Instructions (Addendum)
History of Acute Urticaria - At this time etiology of hives is unknown. Hives can be caused by a variety of different triggers including illness/infection, exercise, pressure, vibrations, extremes of temperature to name a few however majority of the time there is no identifiable trigger.  - Please let us know if this recurs.   Oral Pruritus-Pollen Food Allergy Syndrome: - Negative SPT 10/2022 to common allergenic foods.  - These symptoms are typically not life-threatening and are because of a cross reaction between a pollen you are allergic to, and to a protein in specific foods (such as fresh fruits, vegetables, and nuts). - If you can eat these things and tolerate the symptoms, it is fine to continue to do so.  If not, you may avoid these fresh fruits and vegetables.   - Heating these foods, buying them canned, and peeling these foods should allow them to be consumed without symptoms or with less symptoms.  Allergic Rhinitis: - Positive skin test 10/2022: trees, grasses, weeds, dust mites, cats, cockroach.  - Avoidance measures discussed. - Use nasal saline rinses before nose sprays such as with Neilmed Sinus Rinse.  Use distilled water.   - Use Flonase 2 sprays each nostril daily. Aim upward and outward. - Use Azelastine 1-2 sprays each nostril twice daily as needed for runny nose, drainage, sneezing, congestion. Aim upward and outward. - Use Zyrtec 10 mg daily as needed for runny nose, sneezing, itchy watery eyes..  - Consider allergy shots as long term control of your symptoms by teaching your immune system to be more tolerant of your allergy triggers

## 2022-12-14 ENCOUNTER — Encounter: Payer: Self-pay | Admitting: Family Medicine

## 2022-12-14 ENCOUNTER — Other Ambulatory Visit: Payer: Self-pay

## 2022-12-14 ENCOUNTER — Ambulatory Visit: Payer: MEDICAID | Attending: Family Medicine | Admitting: Pharmacist

## 2022-12-14 ENCOUNTER — Encounter: Payer: Self-pay | Admitting: Pharmacist

## 2022-12-14 DIAGNOSIS — Z7985 Long-term (current) use of injectable non-insulin antidiabetic drugs: Secondary | ICD-10-CM | POA: Diagnosis not present

## 2022-12-14 DIAGNOSIS — E1165 Type 2 diabetes mellitus with hyperglycemia: Secondary | ICD-10-CM

## 2022-12-14 DIAGNOSIS — Z794 Long term (current) use of insulin: Secondary | ICD-10-CM

## 2022-12-14 NOTE — Progress Notes (Signed)
Established Patient Office Visit  Subjective    Patient ID: Melissa Gilmore, female    DOB: 03/19/1995  Age: 27 y.o. MRN: [id number]  CC:  Chief Complaint  Patient presents with   Follow-up    Nausea, dizziest     HPI Melissa Gilmore [facility] presents for complaint of intermittent dizziness. She also repors some irregular menses. She is wondering that she might be pregnant.   Outpatient Encounter Medications as of 12/12/2022  Medication Sig   Accu-Chek Softclix Lancets lancets Use to check blood sugar twice daily. Dx E11.65   Continuous Glucose Sensor (FREESTYLE LIBRE 3 SENSOR) MISC Place 1 sensor on the skin every 14 days. Use to check glucose continuously   glucose blood (ACCU-CHEK GUIDE) test strip Use to check blood sugar twice daily. Dx E11.65   Insulin Pen Needle (B-D UF III MINI PEN NEEDLES) 31G X 5 MM MISC Use as instructed. Inject into the skin once daily   meclizine (ANTIVERT) 25 MG tablet Take 1 tablet (25 mg total) by mouth 3 (three) times daily as needed for dizziness.   Semaglutide, 1 MG/DOSE, 4 MG/3ML SOPN Inject 1 mg as directed once a week.   [DISCONTINUED] azelastine (ASTELIN) 0.1 % nasal spray Place 1 spray into both nostrils 2 (two) times daily as needed. Use in each nostril as directed   [DISCONTINUED] cetirizine (ZYRTEC ALLERGY) 10 MG tablet Take 1 tablet (10 mg total) by mouth daily as needed for allergies.   [DISCONTINUED] fluticasone (FLONASE) 50 MCG/ACT nasal spray Place 2 sprays into both nostrils daily.   [DISCONTINUED] insulin glargine (LANTUS SOLOSTAR) 100 UNIT/ML Solostar Pen Inject 30 Units into the skin at bedtime.   Blood Glucose Monitoring Suppl (ACCU-CHEK GUIDE ME) w/Device KIT Use to check blood sugar twice daily. Dx E11.65   fluconazole (DIFLUCAN) 150 MG tablet Take 1 tablet PO once. Repeat in 3 days if needed. (Patient not taking: Reported on 08/31/2022)   ondansetron (ZOFRAN-ODT) 4 MG disintegrating tablet Take 1 tablet (4 mg total) by  mouth every 8 (eight) hours as needed for nausea or vomiting.   No facility-administered encounter medications on file as of 12/12/2022.    Past Medical History:  Diagnosis Date   Anxiety    Childhood asthma    Depression    Major Depression disorder   Diabetes mellitus without complication (HCC)    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HCC)    Headache    Migraines   Schizophrenia (HCC)    Urticaria     Past Surgical History:  Procedure Laterality Date   CHOLECYSTECTOMY N/A 07/17/2021   Procedure: SINGLE SITE LAPAROSCOPIC CHOLECYSTECTOMY WITH INTRAOPERATIVE CHOLANGIOGRAM; LIVER BIOPSY;  Surgeon: Karie Soda, MD;  Location: [facility];  Service: General;  Laterality: N/A;   IR EPIDUROGRAPHY  03/21/2022   LUMBAR LAMINECTOMY/ DECOMPRESSION WITH MET-RX Right 06/06/2022   Procedure: Right Lumbar three-four Minimally Invasive Surgery Laminectomy/Discectomy;  Surgeon: Jadene Pierini, MD;  Location: [facility] OR;  Service: Neurosurgery;  Laterality: Right;   WISDOM TOOTH EXTRACTION      Family History  Problem Relation Age of Onset   Allergic rhinitis Father    Diabetes Maternal Grandmother    Stomach cancer Maternal Grandmother     Social History   Socioeconomic History   Marital status: Single    Spouse name: Not on file   Number of children: Not on file   Years of education: Not on file   Highest education level: 11th grade  Occupational History   Not  on file  Tobacco Use   Smoking status: Never    Passive exposure: Never   Smokeless tobacco: Never  Vaping Use   Vaping status: Never Used  Substance and Sexual Activity   Alcohol use: Yes    Comment: Occasional- events   Drug use: Never   Sexual activity: Yes    Birth control/protection: None  Other Topics Concern   Not on file  Social History Narrative   Not on file   Social Determinants of Health   Financial Resource Strain: Medium Risk (12/12/2022)   Overall Financial Resource Strain (CARDIA)    Difficulty of Paying  Living Expenses: Somewhat hard  Food Insecurity: Food Insecurity Present (12/12/2022)   Hunger Vital Sign    Worried About Running Out of Food in the Last Year: Sometimes true    Ran Out of Food in the Last Year: Sometimes true  Transportation Needs: Unmet Transportation Needs (12/12/2022)   PRAPARE - Administrator, Civil Service (Medical): Yes    Lack of Transportation (Non-Medical): No  Physical Activity: Insufficiently Active (12/12/2022)   Exercise Vital Sign    Days of Exercise per Week: 3 days    Minutes of Exercise per Session: 30 min  Stress: No Stress Concern Present (12/12/2022)   [employer] of Occupational Health - Occupational Stress Questionnaire    Feeling of Stress : Only a little  Social Connections: Moderately Isolated (12/12/2022)   Social Connection and Isolation Panel [NHANES]    Frequency of Communication with Friends and Family: Three times a week    Frequency of Social Gatherings with Friends and Family: Twice a week    Attends Religious Services: Never    Database administrator or Organizations: No    Attends Banker Meetings: Never    Marital Status: Living with partner  Intimate Partner Violence: Not At Risk (03/18/2022)   Humiliation, Afraid, Rape, and Kick questionnaire    Fear of Current or Ex-Partner: No    Emotionally Abused: No    Physically Abused: No    Sexually Abused: No    Review of Systems  Neurological:  Positive for dizziness. Negative for weakness.  All other systems reviewed and are negative.       Objective    BP 126/85 (BP Location: Right Arm, Patient Position: Sitting, Cuff Size: Large)   Pulse 95   Temp 98.7 F (37.1 C) (Oral)   Resp 18   Ht 5\' 4"  (1.626 m)   Wt 237 lb 9.6 oz (107.8 kg)   LMP 11/12/2022 (Exact Date)   SpO2 95%   BMI 40.78 kg/m   Physical Exam Vitals and nursing note reviewed.  Constitutional:      General: She is not in acute distress.    Appearance: She is obese.   Cardiovascular:     Rate and Rhythm: Normal rate and regular rhythm.  Pulmonary:     Effort: Pulmonary effort is normal.     Breath sounds: Normal breath sounds.  Abdominal:     Palpations: Abdomen is soft.     Tenderness: There is no abdominal tenderness.  Neurological:     General: No focal deficit present.     Mental Status: She is alert and oriented to person, place, and time.         Assessment & Plan:   Vertigo -     POCT urine pregnancy -     CMP14+EGFR -     CBC with Differential/Platelet  Secondary  oligomenorrhea -     POCT urine pregnancy  Class 3 severe obesity due to excess calories without serious comorbidity with body mass index (BMI) of 40.0 to 44.9 in adult [facility])  Encounter for immunization -     Flu vaccine trivalent PF, 6mos and older(Flulaval,Afluria,Fluarix,Fluzone)  Other orders -     Meclizine HCl; Take 1 tablet (25 mg total) by mouth 3 (three) times daily as needed for dizziness.  Dispense: 30 tablet; Refill: 1     No follow-ups on file.   [name], MD

## 2022-12-15 LAB — LIPID PANEL
Chol/HDL Ratio: 4.1 ratio (ref 0.0–4.4)
Cholesterol, Total: 170 mg/dL (ref 100–199)
HDL: 41 mg/dL (ref >39)
LDL Chol Calc (NIH): 108 mg/dL — ABNORMAL HIGH (ref 0–99)
Triglycerides: 116 mg/dL (ref 0–149)
VLDL Cholesterol Cal: 21 mg/dL (ref 5–40)

## 2022-12-29 ENCOUNTER — Ambulatory Visit (INDEPENDENT_AMBULATORY_CARE_PROVIDER_SITE_OTHER): Payer: MEDICAID | Admitting: Family Medicine

## 2022-12-29 ENCOUNTER — Encounter: Payer: Self-pay | Admitting: Family Medicine

## 2022-12-29 VITALS — BP 124/85 | HR 82 | Temp 98.8°F | Resp 18 | Ht 64.0 in | Wt 239.0 lb

## 2022-12-29 DIAGNOSIS — E66813 Obesity, class 3: Secondary | ICD-10-CM

## 2022-12-29 DIAGNOSIS — Z7985 Long-term (current) use of injectable non-insulin antidiabetic drugs: Secondary | ICD-10-CM | POA: Diagnosis not present

## 2022-12-29 DIAGNOSIS — Z794 Long term (current) use of insulin: Secondary | ICD-10-CM

## 2022-12-29 DIAGNOSIS — Z6841 Body Mass Index (BMI) 40.0 and over, adult: Secondary | ICD-10-CM | POA: Diagnosis not present

## 2022-12-29 DIAGNOSIS — E1165 Type 2 diabetes mellitus with hyperglycemia: Secondary | ICD-10-CM

## 2022-12-29 DIAGNOSIS — E119 Type 2 diabetes mellitus without complications: Secondary | ICD-10-CM

## 2022-12-29 LAB — POCT GLYCOSYLATED HEMOGLOBIN (HGB A1C): Hemoglobin A1C: 7.7 % — AB (ref 4.0–5.6)

## 2022-12-29 NOTE — Progress Notes (Unsigned)
Established Patient Office Visit  Subjective    Patient ID: Melissa Gilmore, female    DOB: 11/17/1995  Age: 27 y.o. MRN: [id number]  CC:  Chief Complaint  Patient presents with   Follow-up    HPI Melissa Gilmore [facility] presents for follow up of diabetes. Patient had a greatly elevated A1c but she reports that she has been working on getting it down. She reports that she has been having some low blood sugars several times a day as of late.   Outpatient Encounter Medications as of 12/29/2022  Medication Sig   Accu-Chek Softclix Lancets lancets Use to check blood sugar twice daily. Dx E11.65   azelastine (ASTELIN) 0.1 % nasal spray Place 1 spray into both nostrils 2 (two) times daily as needed. Use in each nostril as directed   Blood Glucose Monitoring Suppl (ACCU-CHEK GUIDE ME) w/Device KIT Use to check blood sugar twice daily. Dx E11.65   cetirizine (ZYRTEC ALLERGY) 10 MG tablet Take 1 tablet (10 mg total) by mouth daily as needed for allergies.   Continuous Glucose Sensor (FREESTYLE LIBRE 3 SENSOR) MISC Place 1 sensor on the skin every 14 days. Use to check glucose continuously   fluticasone (FLONASE) 50 MCG/ACT nasal spray Place 2 sprays into both nostrils daily.   glucose blood (ACCU-CHEK GUIDE) test strip Use to check blood sugar twice daily. Dx E11.65   insulin glargine (LANTUS SOLOSTAR) 100 UNIT/ML Solostar Pen Inject 30 Units into the skin at bedtime.   Insulin Pen Needle (B-D UF III MINI PEN NEEDLES) 31G X 5 MM MISC Use as instructed. Inject into the skin once daily   meclizine (ANTIVERT) 25 MG tablet Take 1 tablet (25 mg total) by mouth 3 (three) times daily as needed for dizziness.   Semaglutide, 1 MG/DOSE, 4 MG/3ML SOPN Inject 1 mg as directed once a week.   No facility-administered encounter medications on file as of 12/29/2022.    Past Medical History:  Diagnosis Date   Anxiety    Childhood asthma    Depression    Major Depression disorder   Diabetes  mellitus without complication (HCC)    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HCC)    Headache    Migraines   Schizophrenia (HCC)    Urticaria     Past Surgical History:  Procedure Laterality Date   CHOLECYSTECTOMY N/A 07/17/2021   Procedure: SINGLE SITE LAPAROSCOPIC CHOLECYSTECTOMY WITH INTRAOPERATIVE CHOLANGIOGRAM; LIVER BIOPSY;  Surgeon: Karie Soda, MD;  Location: [facility];  Service: General;  Laterality: N/A;   IR EPIDUROGRAPHY  03/21/2022   LUMBAR LAMINECTOMY/ DECOMPRESSION WITH MET-RX Right 06/06/2022   Procedure: Right Lumbar three-four Minimally Invasive Surgery Laminectomy/Discectomy;  Surgeon: Jadene Pierini, MD;  Location: [facility] OR;  Service: Neurosurgery;  Laterality: Right;   WISDOM TOOTH EXTRACTION      Family History  Problem Relation Age of Onset   Allergic rhinitis Father    Diabetes Maternal Grandmother    Stomach cancer Maternal Grandmother     Social History   Socioeconomic History   Marital status: Single    Spouse name: Not on file   Number of children: Not on file   Years of education: Not on file   Highest education level: 11th grade  Occupational History   Not on file  Tobacco Use   Smoking status: Never    Passive exposure: Never   Smokeless tobacco: Never  Vaping Use   Vaping status: Never Used  Substance and Sexual Activity   Alcohol  use: Yes    Comment: Occasional- events   Drug use: Never   Sexual activity: Yes    Birth control/protection: None  Other Topics Concern   Not on file  Social History Narrative   Not on file   Social Determinants of Health   Financial Resource Strain: Medium Risk (12/12/2022)   Overall Financial Resource Strain (CARDIA)    Difficulty of Paying Living Expenses: Somewhat hard  Food Insecurity: Food Insecurity Present (12/12/2022)   Hunger Vital Sign    Worried About Running Out of Food in the Last Year: Sometimes true    Ran Out of Food in the Last Year: Sometimes true  Transportation Needs: Unmet  Transportation Needs (12/12/2022)   PRAPARE - Administrator, Civil Service (Medical): Yes    Lack of Transportation (Non-Medical): No  Physical Activity: Insufficiently Active (12/12/2022)   Exercise Vital Sign    Days of Exercise per Week: 3 days    Minutes of Exercise per Session: 30 min  Stress: No Stress Concern Present (12/12/2022)   [employer] of Occupational Health - Occupational Stress Questionnaire    Feeling of Stress : Only a little  Social Connections: Moderately Isolated (12/12/2022)   Social Connection and Isolation Panel [NHANES]    Frequency of Communication with Friends and Family: Three times a week    Frequency of Social Gatherings with Friends and Family: Twice a week    Attends Religious Services: Never    Database administrator or Organizations: No    Attends Banker Meetings: Never    Marital Status: Living with partner  Intimate Partner Violence: Not At Risk (03/18/2022)   Humiliation, Afraid, Rape, and Kick questionnaire    Fear of Current or Ex-Partner: No    Emotionally Abused: No    Physically Abused: No    Sexually Abused: No    Review of Systems  All other systems reviewed and are negative.       Objective    BP 124/85 (BP Location: Right Arm, Patient Position: Sitting, Cuff Size: Large)   Pulse 82   Temp 98.8 F (37.1 C) (Oral)   Resp 18   Ht 5\' 4"  (1.626 m)   Wt 239 lb (108.4 kg)   SpO2 98%   BMI 41.02 kg/m   Physical Exam Vitals and nursing note reviewed.  Constitutional:      General: She is not in acute distress.    Appearance: She is obese.  Cardiovascular:     Rate and Rhythm: Normal rate and regular rhythm.  Pulmonary:     Effort: Pulmonary effort is normal.     Breath sounds: Normal breath sounds.  Abdominal:     Palpations: Abdomen is soft.     Tenderness: There is no abdominal tenderness.  Neurological:     General: No focal deficit present.     Mental Status: She is alert and oriented to  person, place, and time.         Assessment & Plan:  1. Type 2 diabetes mellitus with hyperglycemia, with long-term current use of insulin (HCC) Much improved A1c. Will decrease lantus from 30 units to 25 units daily - HgB A1c  2. Insulin-requiring or dependent type II diabetes mellitus (HCC)   3. Long-term current use of injectable noninsulin antidiabetic medication   4. Class 3 severe obesity due to excess calories without serious comorbidity with body mass index (BMI) of 40.0 to 44.9 in adult [facility])    Return  in about 3 months (around 03/31/2023) for follow up.   [name], MD

## 2023-01-02 ENCOUNTER — Encounter: Payer: Self-pay | Admitting: Family Medicine

## 2023-01-05 ENCOUNTER — Ambulatory Visit: Payer: MEDICAID | Admitting: Family Medicine

## 2023-01-08 ENCOUNTER — Other Ambulatory Visit: Payer: Self-pay

## 2023-01-08 ENCOUNTER — Other Ambulatory Visit: Payer: Self-pay | Admitting: Family Medicine

## 2023-01-08 DIAGNOSIS — E1165 Type 2 diabetes mellitus with hyperglycemia: Secondary | ICD-10-CM

## 2023-01-08 MED ORDER — LANTUS SOLOSTAR 100 UNIT/ML ~~LOC~~ SOPN
30.0000 [IU] | PEN_INJECTOR | Freq: Every day | SUBCUTANEOUS | 0 refills | Status: DC
  Filled 2023-01-08 – 2023-01-09 (×2): qty 15, 50d supply, fill #0

## 2023-01-09 ENCOUNTER — Other Ambulatory Visit: Payer: Self-pay

## 2023-01-11 ENCOUNTER — Other Ambulatory Visit: Payer: Self-pay

## 2023-01-15 ENCOUNTER — Other Ambulatory Visit: Payer: Self-pay

## 2023-01-15 ENCOUNTER — Ambulatory Visit: Payer: MEDICAID | Attending: Nurse Practitioner | Admitting: Pharmacist

## 2023-01-15 ENCOUNTER — Encounter: Payer: Self-pay | Admitting: Pharmacist

## 2023-01-15 DIAGNOSIS — Z7985 Long-term (current) use of injectable non-insulin antidiabetic drugs: Secondary | ICD-10-CM | POA: Diagnosis not present

## 2023-01-15 DIAGNOSIS — E1165 Type 2 diabetes mellitus with hyperglycemia: Secondary | ICD-10-CM | POA: Diagnosis not present

## 2023-01-15 DIAGNOSIS — E119 Type 2 diabetes mellitus without complications: Secondary | ICD-10-CM | POA: Diagnosis not present

## 2023-01-15 DIAGNOSIS — K812 Acute cholecystitis with chronic cholecystitis: Secondary | ICD-10-CM

## 2023-01-15 DIAGNOSIS — Z794 Long term (current) use of insulin: Secondary | ICD-10-CM | POA: Diagnosis not present

## 2023-01-15 MED ORDER — SEMAGLUTIDE (1 MG/DOSE) 4 MG/3ML ~~LOC~~ SOPN
1.0000 mg | PEN_INJECTOR | SUBCUTANEOUS | 2 refills | Status: DC
  Filled 2023-01-15: qty 3, 28d supply, fill #0

## 2023-01-15 MED ORDER — LANTUS SOLOSTAR 100 UNIT/ML ~~LOC~~ SOPN
22.0000 [IU] | PEN_INJECTOR | Freq: Every day | SUBCUTANEOUS | 2 refills | Status: DC
  Filled 2023-01-15 – 2023-02-26 (×3): qty 15, 68d supply, fill #0

## 2023-01-15 NOTE — Progress Notes (Signed)
    S:    PCP: Dr. Andrey Campanile PMH: T2DM (dx 2018), HLD, obesity  Patient arrives in good spirits. Presents for diabetes evaluation, education, and management. Patient was originally referred Primary Care Provider on 09/29/2022. Last seen by PCP on 12/29/22 and her A1c showed improvement (7.7% down from 11%). Last seen by pharmacist on 12/14/2022. We continued her diabetes regimen at that visit.   Today, patient reports doing well. Her Josephine Igo covers came in and she is having better success with Josephine Igo activity (summarized below). She continues to tolerate Ozempic well. Denies N/V, abdominal pain but notes a decreased appetite. She continues to take her Lantus but her dose was recently decreased to 25 units by her PCP d/t hypoglycemia.   Family/Social History: -FHx: Diabetes (MGP) -Tobacco user: never  Insurance coverage/medication affordability: [employer]  Current diabetes medications: Lantus 25 units daily (tries to takes in AM) Ozempic 1mg  weekly   Patient endorses hypoglycemia. Even though this does not show on her AGP report, I do note several daily readings in the 60s. These are rapidly corrected/tx'd successfully.   Patient denies nocturia (nighttime urination).  Patient reports neuropathy (nerve pain) in fingers. Patient reports some blurred vision.  Patient reports fatigue.  Patient reported dietary habits: Rice, fruits, not a big of vegetables, occasionally eats red meats; drinks water, rarely drinks soda, sometimes juice - Limiting carb intake/tortillas since last visit - May drink sugary beverage over span of 7am - 2pm if her fiance brings her one home. Usually shares this drink with her sister  Patient-reported exercise habits: not currently  - Back surgery in February has limited her exercise as she used to walk several times a week for 30 minutes - 2 hours, but she is trying to start walking that much again  O: Date of Download: 11/12 - 01/15/2023 % Time CGM is active:  99% Average Glucose: 153 mg/dL Glucose Management Indicator: 7.0  Glucose Variability: 25.8 (goal <36%) Time in Goal:  - Time in range 70-180: 78% - Time above range: 22% - Time below range: 0%  Lab Results  Component Value Date   HGBA1C 7.7 (A) 12/29/2022   There were no vitals filed for this visit.  Lipid Panel     Component Value Date/Time   CHOL 170 12/14/2022 1458   TRIG 116 12/14/2022 1458   HDL 41 12/14/2022 1458   CHOLHDL 4.1 12/14/2022 1458   LDLCALC 108 (H) 12/14/2022 1458   Clinical Atherosclerotic Cardiovascular Disease (ASCVD): No  The ASCVD Risk score (Arnett DK, et al., 2019) failed to calculate for the following reasons:   The 2019 ASCVD risk score is only valid for ages 30 to 26   A/P: Diabetes longstanding currentlly above goal but improved with A1C of 7.7%. Home CGM data shows continued improvement as well. However, she is experiencing occasional hypoglycemia with numbers in the 60s overnight. Will decrease Lantus this time.  -Continue Ozempic 1mg  weekly -Decrease Lantus to 22u daily. -Extensively discussed pathophysiology of diabetes, recommended lifestyle interventions, dietary effects on blood sugar control -Counseled on s/sx of and management of hypoglycemia -Next A1C anticipated at PCP visit at end of 03/2023  Written patient instructions provided. Total time in face to face counseling 30 minutes.    Follow up Pharmacist: 02/26/2023 PCP Visit: 03/26/2023  Butch Penny, PharmD, BCACP, CPP Clinical Pharmacist [facility] [phone]

## 2023-02-05 ENCOUNTER — Other Ambulatory Visit (HOSPITAL_COMMUNITY)
Admission: RE | Admit: 2023-02-05 | Discharge: 2023-02-05 | Disposition: A | Discharge status: Home / Self Care | Payer: MEDICAID | Source: Ambulatory Visit | Attending: Obstetrics & Gynecology | Admitting: Obstetrics & Gynecology

## 2023-02-05 ENCOUNTER — Encounter: Payer: Self-pay | Admitting: Obstetrics & Gynecology

## 2023-02-05 ENCOUNTER — Ambulatory Visit: Payer: MEDICAID | Admitting: Obstetrics & Gynecology

## 2023-02-05 VITALS — BP 129/88 | HR 87 | Wt 231.1 lb

## 2023-02-05 DIAGNOSIS — Z01419 Encounter for gynecological examination (general) (routine) without abnormal findings: Secondary | ICD-10-CM

## 2023-02-05 DIAGNOSIS — B9689 Other specified bacterial agents as the cause of diseases classified elsewhere: Secondary | ICD-10-CM

## 2023-02-05 DIAGNOSIS — N760 Acute vaginitis: Secondary | ICD-10-CM | POA: Insufficient documentation

## 2023-02-05 DIAGNOSIS — Z3201 Encounter for pregnancy test, result positive: Secondary | ICD-10-CM

## 2023-02-05 DIAGNOSIS — Z331 Pregnant state, incidental: Secondary | ICD-10-CM

## 2023-02-05 LAB — CERVICOVAGINAL ANCILLARY ONLY
Bacterial Vaginitis (gardnerella): POSITIVE — AB
Candida Glabrata: NEGATIVE
Candida Vaginitis: NEGATIVE
Chlamydia: NEGATIVE
Comment: NEGATIVE
Comment: NEGATIVE
Comment: NEGATIVE
Comment: NEGATIVE
Comment: NEGATIVE
Comment: NORMAL
Neisseria Gonorrhea: NEGATIVE
Trichomonas: NEGATIVE

## 2023-02-05 LAB — POCT PREGNANCY, URINE: Preg Test, Ur: POSITIVE — AB

## 2023-02-05 MED ORDER — PRENATAL 28-0.8 MG PO TABS
1.0000 | ORAL_TABLET | Freq: Every day | ORAL | 12 refills | Status: DC
Start: 2023-02-05 — End: 2023-12-06

## 2023-02-05 NOTE — Patient Instructions (Signed)
  Safe Medications in Pregnancy  that are over the counter  Acne:  Benzoyl Peroxide  Salicylic Acid  *NO-Retin A  Backache/Headache:  Tylenol: 2 Regular strength every 4 hours OR               2 Extra strength every 6 hours   Colds/Coughs/Allergies: Allegra Benadryl (alcohol free) 25 mg every 6 hours as needed  Breath right strips  Claritin  Cepacol throat lozenges  Chloraseptic throat spray  Cold-Eeze- up to three times per day  Cough drops, alcohol free  Flonase Guaifenesin  Mucinex (plain/DM) Nasacort Robitussin DM (plain only, alcohol free)  Saline nasal spray/drops Steroid nasal sprays Sudafed (Pseudoephedrine) & Actifed * use only after [redacted] weeks gestation and if you do not have high blood pressure  Tylenol  Vicks Vaporub  Zicam Zinc lozenges  Zyrtec   Make sure to not take anything that has the active ingredient Phenylephrine   Constipation:  Colace  Ducolax suppositories  Fleet enema  Glycerin suppositories  Metamucil  Milk of magnesia  Miralax  Senokot  Smooth move tea   Diarrhea:  Kaopectate  Imodium A-D   *NO Pepto Bismol   Hemorrhoids:  Anusol  Anusol-HC  Preparation-H  Tucks   Indigestion:  Tums  Maalox  Mylanta  Nexium Pepcid  Zantac Prevacid Protonix Prilosec  Insomnia:  Benadryl 25 - 50 mg every 6 hours as needed  Tylenol PM  Unisom  Leg Cramps:  Tums  Magnesium tablets  Nausea/Vomiting:  Bonine  Dramamine  Emetrol  Ginger extract  Sea bands  Meclizine   Nausea medication to take during pregnancy:  Unisom (doxylamine succinate 25 mg tablets) Take one tablet daily at bedtime. If symptoms are not adequately controlled, the dose can be increased to a maximum recommended dose of two tablets daily (1/2 tablet in the morning, 1/2 tablet mid-afternoon and one at bedtime).  Vitamin B6 100mg tablets. Take one tablet twice a day (up to 200 mg per day).   Skin Rashes:  Aveeno products  Benadryl cream or Benadryl  tablets 25mg every 6 hours as needed  Calamine Lotion  1% Hydrocortisone cream   Yeast infection:  Gyne-lotrimin 7  Monistat 3 or 7day   **If taking multiple medications, please check labels to avoid duplicating the same active ingredients  **Take medication as directed on the label ** ** Do not exceed 4000 mg of Tylenol/Acetaminophen in 24 hours**  **Do not take medications that contain Aspirin or Ibuprofen** Unless your doctor has prescribed low dose Aspirin for prevention of Pre-eclampsia       

## 2023-02-05 NOTE — Progress Notes (Signed)
GYNECOLOGY ANNUAL PREVENTATIVE CARE ENCOUNTER NOTE  History:     Melissa Gilmore is a 27 y.o. G0P0000 female here for a routine annual gynecologic exam.  Current complaints: nausea, vomiting and breast tenderness.  Long history of having few menstrual periods and has bene off Depo Provera for many months. Last spotting episode was in Oct 2024, lasted for a few hours.  Desires a pregnancy test.   Denies abnormal vaginal discharge, pelvic pain, problems with intercourse or other gynecologic concerns.    Gynecologic History Patient's last menstrual period was 11/12/2022 (exact date). Contraception: none Last Pap: 2019. Result was normal with negative HPV  Obstetric History OB History  Gravida Para Term Preterm AB Living  0 0 0 0 0 0  SAB IAB Ectopic Multiple Live Births  0 0 0 0 0    Past Medical History:  Diagnosis Date   Acute cholecystitis with chronic cholecystitis 07/16/2021   Anxiety    Childhood asthma    Depression    Major Depression disorder   Diabetes mellitus without complication [facility])    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HCC)    Headache    Migraines   History of abnormal uterine bleeding 07/17/2021   Schizophrenia (HCC)    Urticaria     Past Surgical History:  Procedure Laterality Date   CHOLECYSTECTOMY N/A 07/17/2021   Procedure: SINGLE SITE LAPAROSCOPIC CHOLECYSTECTOMY WITH INTRAOPERATIVE CHOLANGIOGRAM; LIVER BIOPSY;  Surgeon: Karie Soda, MD;  Location: [facility];  Service: General;  Laterality: N/A;   IR EPIDUROGRAPHY  03/21/2022   LUMBAR LAMINECTOMY/ DECOMPRESSION WITH MET-RX Right 06/06/2022   Procedure: Right Lumbar three-four Minimally Invasive Surgery Laminectomy/Discectomy;  Surgeon: Jadene Pierini, MD;  Location: [facility];  Service: Neurosurgery;  Laterality: Right;   WISDOM TOOTH EXTRACTION      Current Outpatient Medications on File Prior to Visit  Medication Sig Dispense Refill   Accu-Chek Softclix Lancets lancets Use to check blood  sugar twice daily. Dx E11.65 100 each 3   azelastine (ASTELIN) 0.1 % nasal spray Place 1 spray into both nostrils 2 (two) times daily as needed. Use in each nostril as directed 30 mL 5   Blood Glucose Monitoring Suppl (ACCU-CHEK GUIDE ME) w/Device KIT Use to check blood sugar twice daily. Dx E11.65 1 kit 0   cetirizine (ZYRTEC ALLERGY) 10 MG tablet Take 1 tablet (10 mg total) by mouth daily as needed for allergies. 30 tablet 5   Continuous Glucose Sensor (FREESTYLE LIBRE 3 SENSOR) MISC Place 1 sensor on the skin every 14 days. Use to check glucose continuously 2 each 6   fluticasone (FLONASE) 50 MCG/ACT nasal spray Place 2 sprays into both nostrils daily. 16 g 5   glucose blood (ACCU-CHEK GUIDE) test strip Use to check blood sugar twice daily. Dx E11.65 100 each 12   insulin glargine (LANTUS SOLOSTAR) 100 UNIT/ML Solostar Pen Inject 22 Units into the skin at bedtime. 15 mL 2   Insulin Pen Needle (B-D UF III MINI PEN NEEDLES) 31G X 5 MM MISC Use as instructed. Inject into the skin once daily 100 each 3   No current facility-administered medications on file prior to visit.    No Known Allergies  Social History:  reports that she has never smoked. She has never been exposed to tobacco smoke. She has never used smokeless tobacco. She reports current alcohol use. She reports that she does not use drugs.  Family History  Problem Relation Age of Onset   Allergic rhinitis  Father    Diabetes Maternal Grandmother    Stomach cancer Maternal Grandmother     The following portions of the patient's history were reviewed and updated as appropriate: allergies, current medications, past family history, past medical history, past social history, past surgical history and problem list.  Review of Systems Pertinent items noted in HPI and remainder of comprehensive ROS otherwise negative.  Physical Exam:  BP 129/88   Pulse 87   Wt 231 lb 1.6 oz (104.8 kg)   LMP 11/12/2022 (Exact Date)   BMI 39.67 kg/m   CONSTITUTIONAL: Well-developed, well-nourished female in no acute distress.  HENT:  Normocephalic, atraumatic, External right and left ear normal.  EYES: Conjunctivae and EOM are normal. Pupils are equal, round, and reactive to light. No scleral icterus.  NECK: Normal range of motion, supple, no masses.  Normal thyroid.  SKIN: Skin is warm and dry. No rash noted. Not diaphoretic. No erythema. No pallor. MUSCULOSKELETAL: Normal range of motion. No tenderness.  No cyanosis, clubbing, or edema. NEUROLOGIC: Alert and oriented to person, place, and time. Normal reflexes, muscle tone coordination.  PSYCHIATRIC: Normal mood and affect. Normal behavior. Normal judgment and thought content. CARDIOVASCULAR: Normal heart rate noted, regular rhythm RESPIRATORY: Clear to auscultation bilaterally. Effort and breath sounds normal, no problems with respiration noted. BREASTS: Symmetric in size. No masses, tenderness, skin changes, nipple drainage, or lymphadenopathy bilaterally. Performed in the presence of a chaperone. ABDOMEN: Soft, no distention noted.  No tenderness, rebound or guarding.  PELVIC: Normal appearing external genitalia and urethral meatus; normal appearing vaginal mucosa and cervix.  Yellow vaginal discharge noted, testing sample obtained.  Pap smear obtained which caused some bleeding, ameliorated by pressure from fox swabs and application of silver nitrate.  Normal uterine size, no other palpable masses, no uterine or adnexal tenderness.  Performed in the presence of a chaperone.  Results for orders placed or performed in visit on 02/05/23 (from the past 24 hours)  Pregnancy, urine POC     Status: Abnormal   Collection Time: 02/05/23 10:16 AM  Result Value Ref Range   Preg Test, Ur POSITIVE (A) NEGATIVE     Assessment and Plan:    1. Pregnancy test positive for incidental pregnancy Will get BHCG level, and decide on when she can have viability scan. Discussed high risk pregnancy given  her T2DM and other co-morbidities. Patient is on Ozempic, she will discontinue this and continue on her Lantus. She will contact her PCP to adjust regimen accordingly given that Ozempic will be discontinued. Prenatal vitamins prescribed.  - Beta hCG quant (ref lab) - Prenatal 28-0.8 MG TABS; Take 1 tablet by mouth daily.  Dispense: 30 tablet; Refill: 12  2. Well woman exam with routine gynecological exam (Primary) - Cytology - PAP - Cervicovaginal ancillary only Will follow up results of pap smear and vaginal discharge testing and manage accordingly. Normal breast examination today, she was advised to perform periodic self breast examinations.  Routine preventative health maintenance measures emphasized. Please refer to After Visit Summary for other counseling recommendations.      Jaynie Collins, MD, FACOG Obstetrician & Gynecologist, [facility] for [employer], [facility]

## 2023-02-06 ENCOUNTER — Telehealth: Payer: Self-pay

## 2023-02-06 ENCOUNTER — Ambulatory Visit: Payer: Self-pay

## 2023-02-06 DIAGNOSIS — O3680X0 Pregnancy with inconclusive fetal viability, not applicable or unspecified: Secondary | ICD-10-CM

## 2023-02-06 LAB — BETA HCG QUANT (REF LAB): hCG Quant: 38548 m[IU]/mL

## 2023-02-06 MED ORDER — METRONIDAZOLE 500 MG PO TABS
500.0000 mg | ORAL_TABLET | Freq: Two times a day (BID) | ORAL | 0 refills | Status: AC
Start: 2023-02-06 — End: 2023-02-13

## 2023-02-06 NOTE — Telephone Encounter (Addendum)
-----   Message from Martell Anyanwu sent at 02/06/2023  7:57 AM EST ----- Please schedule viability scan as soon as possible followed by New OB intake/visit.  Pt informed of BV results and treatment has [facility] on [address]  Almeta agreed to U/S scheduled on 02/13/23 at 1115.  Front office notified to contact pt with NEW OB intake and provider appts.    Roselyne Stalnaker,RN

## 2023-02-06 NOTE — Telephone Encounter (Signed)
 Chief Complaint: Needs medication adjustment, Also pain under right arm Symptoms: see below Frequency: pain 1 hour 6/10 Pertinent Negatives: Patient denies  Disposition: [] ED /[] Urgent Care (no appt availability in office) / [] Appointment(In office/virtual)/ []  [facility]/ [] Home Care/ [] Refused Recommended Disposition /[] [facility]/ [x]  Follow-up with PCP Additional Notes: Pt is pregnant about 2-3 months. Saw OB/GYN yesterday, and discontinued Ozempic . Pt will need to have insulin  adjusted. Pt is due for her Ozempic  tomorrow. Please advise ASAP.  Pt also reports side pain under her right arm. She was twisting and moving a lot just prior to pain. Pt will try warmth therapy, tylenol  and light massage. If pain does not resolve she will go to ED.    Summary: Pregnancy - rx adjustment   Pt went to OBGYN yesterday and was informed she is pregnant about 2-3 months. Pt informed by OB she needs to stop Ozempic , and needs the insulin  increased.  Pt requesting increase in insulin . Please reach out to patient if any more information is needed.     Reason for Disposition  [1] SEVERE pain AND [2] present < 1 hour  [1] Caller has URGENT medicine question about med that PCP or specialist prescribed AND [2] triager unable to answer question  Answer Assessment - Initial Assessment Questions 1. LOCATION: Where does it hurt?      Upper right side - up to under her arm pit 2. RADIATION: Does the pain shoot anywhere else? (e.g., chest, back)     Just in that spot 3. ONSET: When did the pain begin? (e.g., minutes, hours or days ago)      A couple of hours ago 4. SUDDEN: Gradual or sudden onset?     Sudden  5. PATTERN Does the pain come and go, or is it constant?    - If it comes and goes: How long does it last? Do you have pain now?     (Note: Comes and goes means the pain is intermittent. It goes away completely between bouts.)    - If constant: Is it getting  better, staying the same, or getting worse?      (Note: Constant means the pain never goes away completely; most serious pain is constant and gets worse.)      Continues 6. SEVERITY: How bad is the pain?  (e.g., Scale 1-10; mild, moderate, or severe)    - MILD (1-3): Doesn't interfere with normal activities, abdomen soft and not tender to touch.     - MODERATE (4-7): Interferes with normal activities or awakens from sleep, abdomen tender to touch.     - SEVERE (8-10): Excruciating pain, doubled over, unable to do any normal activities.       moderate 7. RECURRENT SYMPTOM: Have you ever had this type of stomach pain before? If Yes, ask: When was the last time? and What happened that time?      no 8. CAUSE: What do you think is causing the stomach pain?     nothing 9. RELIEVING/AGGRAVATING FACTORS: What makes it better or worse? (e.g., antacids, bending or twisting motion, bowel movement)      10. OTHER SYMPTOMS: Do you have any other symptoms? (e.g., back pain, diarrhea, fever, urination pain, vomiting)       no 11. PREGNANCY: Is there any chance you are pregnant? When was your last menstrual period?       Yes!  Answer Assessment - Initial Assessment Questions 1. NAME of MEDICINE: What medicine(s) are  you calling about?     insulin  2. QUESTION: What is your question? (e.g., double dose of medicine, side effect)     Cannot continue on Ozempic  5. PREGNANCY:  Is there any chance that you are pregnant? When was your last menstrual period?     yes  Protocols used: Abdominal Pain - Female-A-AH, Medication Question Call-A-AH

## 2023-02-08 LAB — CYTOLOGY - PAP
Comment: NEGATIVE
Diagnosis: NEGATIVE
High risk HPV: NEGATIVE

## 2023-02-12 ENCOUNTER — Other Ambulatory Visit: Payer: Self-pay

## 2023-02-13 ENCOUNTER — Other Ambulatory Visit: Payer: MEDICAID

## 2023-02-13 DIAGNOSIS — Z3491 Encounter for supervision of normal pregnancy, unspecified, first trimester: Secondary | ICD-10-CM

## 2023-02-13 DIAGNOSIS — Z3A13 13 weeks gestation of pregnancy: Secondary | ICD-10-CM | POA: Diagnosis not present

## 2023-02-13 DIAGNOSIS — O3680X0 Pregnancy with inconclusive fetal viability, not applicable or unspecified: Secondary | ICD-10-CM

## 2023-02-22 ENCOUNTER — Telehealth: Payer: MEDICAID

## 2023-02-22 ENCOUNTER — Other Ambulatory Visit: Payer: Self-pay

## 2023-02-22 ENCOUNTER — Ambulatory Visit (INDEPENDENT_AMBULATORY_CARE_PROVIDER_SITE_OTHER): Payer: MEDICAID | Admitting: Family

## 2023-02-22 ENCOUNTER — Telehealth: Payer: Self-pay | Admitting: Family Medicine

## 2023-02-22 VITALS — BP 137/83 | HR 87 | Temp 98.1°F | Resp 16

## 2023-02-22 DIAGNOSIS — R0981 Nasal congestion: Secondary | ICD-10-CM

## 2023-02-22 DIAGNOSIS — O0990 Supervision of high risk pregnancy, unspecified, unspecified trimester: Secondary | ICD-10-CM | POA: Insufficient documentation

## 2023-02-22 DIAGNOSIS — Z794 Long term (current) use of insulin: Secondary | ICD-10-CM | POA: Diagnosis not present

## 2023-02-22 DIAGNOSIS — E1165 Type 2 diabetes mellitus with hyperglycemia: Secondary | ICD-10-CM

## 2023-02-22 DIAGNOSIS — H9202 Otalgia, left ear: Secondary | ICD-10-CM | POA: Diagnosis not present

## 2023-02-22 DIAGNOSIS — O0992 Supervision of high risk pregnancy, unspecified, second trimester: Secondary | ICD-10-CM

## 2023-02-22 DIAGNOSIS — Z3A14 14 weeks gestation of pregnancy: Secondary | ICD-10-CM

## 2023-02-22 LAB — POCT RAPID STREP A (OFFICE): Rapid Strep A Screen: NEGATIVE

## 2023-02-22 MED ORDER — NEOMYCIN-POLYMYXIN-HC 1 % OT SOLN: 3.0000 [drp] | Freq: Three times a day (TID) | OTIC | 0 refills | Status: DC

## 2023-02-22 NOTE — Telephone Encounter (Signed)
Pt. Given rapid strep test results, verbalizes understanding.

## 2023-02-22 NOTE — Progress Notes (Signed)
Patient c/o left ear pain/popping for awhile 6/10. Patient is also expecting a child and is concern about her DM. I explain that her DM would probably be managed by her OBGYN until after having her child.

## 2023-02-22 NOTE — Progress Notes (Signed)
New OB Intake  I connected with Melissa Gilmore [facility]  on 02/22/23 at  1:15 PM EST by MyChart Video Visit and verified that I am speaking with the correct person using two identifiers. Nurse is located at [facility] and pt is located at home.  I discussed the limitations, risks, security and privacy concerns of performing an evaluation and management service by telephone and the availability of in person appointments. I also discussed with the patient that there may be a patient responsible charge related to this service. The patient expressed understanding and agreed to proceed.  I explained I am completing New OB Intake today. We discussed EDD of 08/19/2023, by Last Menstrual Period. Pt is G1P0000. I reviewed her allergies, medications and Medical/Surgical/OB history.    Patient Active Problem List   Diagnosis Date Noted   Supervision of high risk pregnancy, antepartum 02/22/2023   Lumbar radiculopathy 06/06/2022   Syncope 03/23/2022   Acute lumbar back pain 03/18/2022   Dissociative identity disorder [facility]) 07/17/2021   Schizophrenia (HCC) 07/17/2021   Depression 07/17/2021   Insulin-requiring or dependent type II diabetes mellitus (HCC) 07/17/2021   Dyslipidemia 07/17/2021    Concerns addressed today: --Patient stated she see a Psychiatrist and Therapist for her Mental Health   Delivery Plans Plans to deliver at [facility]. Discussed the nature of our practice with multiple providers including residents and students. Due to the size of the practice, the delivering provider may not be the same as those providing prenatal care.   Patient is not interested in water birth. Offered upcoming OB visit with CNM to discuss further.  MyChart/Babyscripts MyChart access verified. I explained pt will have some visits in office and some virtually. Babyscripts instructions given and order placed. Patient verifies receipt of registration text/e-mail. Account successfully created and app downloaded.  If patient is a candidate for Optimized scheduling, add to sticky note.   Blood Pressure Cuff/Weight Scale Patient has private insurance; instructed to purchase blood pressure cuff and bring to first prenatal appt. Explained after first prenatal appt pt will check weekly and document in Babyscripts. Patient does not have weight scale; patient may purchase if they desire to track weight weekly in Babyscripts.  Anatomy US Explained first scheduled [place] will be around 19 weeks. Anatomy US scheduled for 03/27/2023 at 1:15PM.  Is patient a CenteringPregnancy candidate?  Declined Declined due to Schedule    Is patient a Mom+Baby Combined Care candidate?  Declined   If accepted, confirm patient does not intend to move from the area for at least 12 months, then notify Mom+Baby staff  Interested in [place]? If yes, send referral and doula dot phrase.   Is patient a candidate for Babyscripts Optimization? No, due to diabetes type 2   First visit review I reviewed new OB appt with patient. Explained pt will be seen by Carlynn Herald, CNM  at first visit. Discussed Avelina Laine genetic screening with patient. Yes Panorama and Horizon.. Routine prenatal labs is not   Last Pap Diagnosis  Date Value Ref Range Status  02/05/2023   Final   - Negative for intraepithelial lesion or malignancy (NILM)    Vidal Schwalbe, CMA 02/22/2023  1:46 PM

## 2023-02-22 NOTE — Progress Notes (Signed)
Patient ID: Melissa Gilmore, female    DOB: 1995/09/07  MRN: [id number]  CC: Chronic Conditions Follow-Up  Subjective: Melissa Gilmore is a 28 y.o. female who presents for chronic conditions follow-up.  Her concerns today include:  - Diabetes type 2. Patient currently pregnant. Established with Obstetrics / Gynecology and upcoming appointment.  - Left ear pain. Denies red flag symptoms.  - Nasal congestion. Denies red flag symptoms.   Patient Active Problem List   Diagnosis Date Noted   Lumbar radiculopathy 06/06/2022   Syncope 03/23/2022   Acute lumbar back pain 03/18/2022   Dissociative identity disorder [facility]) 07/17/2021   Schizophrenia (HCC) 07/17/2021   Depression 07/17/2021   Insulin-requiring or dependent type II diabetes mellitus (HCC) 07/17/2021   Dyslipidemia 07/17/2021     Current Outpatient Medications on File Prior to Visit  Medication Sig Dispense Refill   Accu-Chek Softclix Lancets lancets Use to check blood sugar twice daily. Dx E11.65 100 each 3   azelastine (ASTELIN) 0.1 % nasal spray Place 1 spray into both nostrils 2 (two) times daily as needed. Use in each nostril as directed 30 mL 5   Blood Glucose Monitoring Suppl (ACCU-CHEK GUIDE ME) w/Device KIT Use to check blood sugar twice daily. Dx E11.65 1 kit 0   cetirizine (ZYRTEC ALLERGY) 10 MG tablet Take 1 tablet (10 mg total) by mouth daily as needed for allergies. 30 tablet 5   Continuous Glucose Sensor (FREESTYLE LIBRE 3 SENSOR) MISC Place 1 sensor on the skin every 14 days. Use to check glucose continuously 2 each 6   fluticasone (FLONASE) 50 MCG/ACT nasal spray Place 2 sprays into both nostrils daily. 16 g 5   glucose blood (ACCU-CHEK GUIDE) test strip Use to check blood sugar twice daily. Dx E11.65 100 each 12   insulin glargine (LANTUS SOLOSTAR) 100 UNIT/ML Solostar Pen Inject 22 Units into the skin at bedtime. 15 mL 2   Insulin Pen Needle (B-D UF III MINI PEN NEEDLES) 31G X 5 MM  MISC Use as instructed. Inject into the skin once daily 100 each 3   Prenatal 28-0.8 MG TABS Take 1 tablet by mouth daily. 30 tablet 12   No current facility-administered medications on file prior to visit.    No Known Allergies  Social History   Socioeconomic History   Marital status: Single    Spouse name: Not on file   Number of children: Not on file   Years of education: Not on file   Highest education level: 11th grade  Occupational History   Not on file  Tobacco Use   Smoking status: Never    Passive exposure: Never   Smokeless tobacco: Never  Vaping Use   Vaping status: Never Used  Substance and Sexual Activity   Alcohol use: Yes    Comment: Occasional- events   Drug use: Never   Sexual activity: Yes    Birth control/protection: None  Other Topics Concern   Not on file  Social History Narrative   Not on file   Social Drivers of Health   Financial Resource Strain: Medium Risk (12/12/2022)   Overall Financial Resource Strain (CARDIA)    Difficulty of Paying Living Expenses: Somewhat hard  Food Insecurity: Food Insecurity Present (12/12/2022)   Hunger Vital Sign    Worried About Running Out of Food in the Last Year: Sometimes true    Ran Out of Food in the Last Year: Sometimes true  Transportation Needs: Unmet Transportation Needs (12/12/2022)  PRAPARE - Administrator, Civil Service (Medical): Yes    Lack of Transportation (Non-Medical): No  Physical Activity: Insufficiently Active (12/12/2022)   Exercise Vital Sign    Days of Exercise per Week: 3 days    Minutes of Exercise per Session: 30 min  Stress: No Stress Concern Present (12/12/2022)   [employer] of Occupational Health - Occupational Stress Questionnaire    Feeling of Stress : Only a little  Social Connections: Moderately Isolated (12/12/2022)   Social Connection and Isolation Panel [NHANES]    Frequency of Communication with Friends and Family: Three times a week    Frequency of  Social Gatherings with Friends and Family: Twice a week    Attends Religious Services: Never    Database administrator or Organizations: No    Attends Banker Meetings: Never    Marital Status: Living with partner  Intimate Partner Violence: Not At Risk (03/18/2022)   Humiliation, Afraid, Rape, and Kick questionnaire    Fear of Current or Ex-Partner: No    Emotionally Abused: No    Physically Abused: No    Sexually Abused: No    Family History  Problem Relation Age of Onset   Allergic rhinitis Father    Diabetes Maternal Grandmother    Stomach cancer Maternal Grandmother     Past Surgical History:  Procedure Laterality Date   CHOLECYSTECTOMY N/A 07/17/2021   Procedure: SINGLE SITE LAPAROSCOPIC CHOLECYSTECTOMY WITH INTRAOPERATIVE CHOLANGIOGRAM; LIVER BIOPSY;  Surgeon: Karie Soda, MD;  Location: [facility];  Service: General;  Laterality: N/A;   IR EPIDUROGRAPHY  03/21/2022   LUMBAR LAMINECTOMY/ DECOMPRESSION WITH MET-RX Right 06/06/2022   Procedure: Right Lumbar three-four Minimally Invasive Surgery Laminectomy/Discectomy;  Surgeon: Jadene Pierini, MD;  Location: [facility];  Service: Neurosurgery;  Laterality: Right;   WISDOM TOOTH EXTRACTION      ROS: Review of Systems Negative except as stated above  PHYSICAL EXAM: BP 137/83   Pulse 87   Temp 98.1 F (36.7 C) (Oral)   Resp 16   LMP 11/12/2022 (Exact Date)   SpO2 100%   Physical Exam HENT:     Head: Normocephalic and atraumatic.     Right Ear: Tympanic membrane, ear canal and external ear normal.     Left Ear: Tympanic membrane, ear canal and external ear normal.     Nose: Nose normal.     Mouth/Throat:     Mouth: Mucous membranes are moist.     Pharynx: Oropharynx is clear.  Eyes:     Extraocular Movements: Extraocular movements intact.     Conjunctiva/sclera: Conjunctivae normal.     Pupils: Pupils are equal, round, and reactive to light.  Cardiovascular:     Rate and Rhythm: Normal rate and  regular rhythm.     Pulses: Normal pulses.     Heart sounds: Normal heart sounds.  Pulmonary:     Effort: Pulmonary effort is normal.     Breath sounds: Normal breath sounds.  Musculoskeletal:        General: Normal range of motion.     Cervical back: Normal range of motion and neck supple.  Neurological:     General: No focal deficit present.     Mental Status: She is alert and oriented to person, place, and time.  Psychiatric:        Mood and Affect: Mood normal.        Behavior: Behavior normal.     ASSESSMENT AND PLAN: Note -  Plan of care discussed with Georganna Skeans, MD.   1. Type 2 diabetes mellitus with hyperglycemia, with long-term current use of insulin (HCC) (Primary) - Obstetrics / Gynecology to manage.   2. Left ear pain - Neomycin-Polymyxin-Hydrocortisone otic solution as prescribed. Counseled on medication adherence/adverse effects.  - Follow-up with primary provider as scheduled. - NEOMYCIN-POLYMYXIN-HYDROCORTISONE (CORTISPORIN) 1 % SOLN OTIC solution; Place 3 drops into the left ear every 8 (eight) hours.  Dispense: 10 mL; Refill: 0  3. Nasal congestion - Patient today in office with no cardiopulmonary/acute distress.  - Routine screening.  - Follow-up with primary provider as scheduled.  - COVID-19, Flu A+B and RSV - POCT rapid strep A; Future - Culture, Group A Strep   Patient was given the opportunity to ask questions.  Patient verbalized understanding of the plan and was able to repeat key elements of the plan. Patient was given clear instructions to go to Emergency Department or return to medical center if symptoms don't improve, worsen, or new problems develop.The patient verbalized understanding.   Orders Placed This Encounter  Procedures   COVID-19, Flu A+B and RSV   Culture, Group A Strep   POCT rapid strep A     Requested Prescriptions   Signed Prescriptions Disp Refills   NEOMYCIN-POLYMYXIN-HYDROCORTISONE (CORTISPORIN) 1 % SOLN OTIC  solution 10 mL 0    Sig: Place 3 drops into the left ear every 8 (eight) hours.    Return in about 1 week (around 03/01/2023) for Follow-Up or next available with Georganna Skeans, MD .  Rema Fendt, NP

## 2023-02-23 LAB — COVID-19, FLU A+B AND RSV
Influenza A, NAA: NOT DETECTED
Influenza B, NAA: NOT DETECTED
RSV, NAA: DETECTED — AB
SARS-CoV-2, NAA: NOT DETECTED

## 2023-02-24 LAB — CULTURE, GROUP A STREP: Strep A Culture: NEGATIVE

## 2023-02-26 ENCOUNTER — Ambulatory Visit: Payer: MEDICAID | Admitting: Pharmacist

## 2023-02-26 ENCOUNTER — Other Ambulatory Visit: Payer: Self-pay

## 2023-02-27 ENCOUNTER — Ambulatory Visit: Payer: MEDICAID | Admitting: Certified Nurse Midwife

## 2023-02-27 ENCOUNTER — Other Ambulatory Visit: Payer: Self-pay

## 2023-02-27 VITALS — BP 117/77 | HR 71 | Wt 236.4 lb

## 2023-02-27 DIAGNOSIS — E119 Type 2 diabetes mellitus without complications: Secondary | ICD-10-CM | POA: Diagnosis not present

## 2023-02-27 DIAGNOSIS — Z31430 Encounter of female for testing for genetic disease carrier status for procreative management: Secondary | ICD-10-CM | POA: Diagnosis not present

## 2023-02-27 DIAGNOSIS — Z3402 Encounter for supervision of normal first pregnancy, second trimester: Secondary | ICD-10-CM

## 2023-02-27 DIAGNOSIS — Z1332 Encounter for screening for maternal depression: Secondary | ICD-10-CM | POA: Diagnosis not present

## 2023-02-27 DIAGNOSIS — O0992 Supervision of high risk pregnancy, unspecified, second trimester: Secondary | ICD-10-CM

## 2023-02-27 DIAGNOSIS — Z3A15 15 weeks gestation of pregnancy: Secondary | ICD-10-CM

## 2023-02-27 DIAGNOSIS — Z794 Long term (current) use of insulin: Secondary | ICD-10-CM

## 2023-02-27 DIAGNOSIS — Z8279 Family history of other congenital malformations, deformations and chromosomal abnormalities: Secondary | ICD-10-CM | POA: Insufficient documentation

## 2023-02-27 DIAGNOSIS — Z8719 Personal history of other diseases of the digestive system: Secondary | ICD-10-CM | POA: Insufficient documentation

## 2023-02-27 NOTE — Progress Notes (Signed)
History:   Melissa Gilmore is a 28 y.o. G1P0000 at [redacted]w[redacted]d by LMP being seen today for her first obstetrical visit.  Her obstetrical history is significant for obesity and Type II diabetes currently being managed with Lanus 30.  Marland Kitchen Patient does intend to breast feed. Pregnancy history fully reviewed. Family hx -spina bifida  Herniated nucleus pulposus with lumbar radiculopathy  Patient reports no complaints. She is patiently awaiting a healthy baby. Patient reports a hx of severe social anxiety with panic attacks and meds. Reports that she has been off meds for a few years now and is doing well with her psychiatrist and therapist.       HISTORY: OB History  Gravida Para Term Preterm AB Living  1 0 0 0 0 0  SAB IAB Ectopic Multiple Live Births  0 0 0 0 0    # Outcome Date GA Lbr Len/2nd Weight Sex Type Anes PTL Lv  1 Current             Last pap smear was done  02/05/2023 and was normal  Past Medical History:  Diagnosis Date   Acute cholecystitis with chronic cholecystitis 07/16/2021   Anxiety    Childhood asthma    Depression    Major Depression disorder   Diabetes mellitus without complication [facility])    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HCC)    Headache    Migraines   History of abnormal uterine bleeding 07/17/2021   Schizophrenia (HCC)    Urticaria    Past Surgical History:  Procedure Laterality Date   CHOLECYSTECTOMY N/A 07/17/2021   Procedure: SINGLE SITE LAPAROSCOPIC CHOLECYSTECTOMY WITH INTRAOPERATIVE CHOLANGIOGRAM; LIVER BIOPSY;  Surgeon: Karie Soda, MD;  Location: [facility];  Service: General;  Laterality: N/A;   IR EPIDUROGRAPHY  03/21/2022   LUMBAR LAMINECTOMY/ DECOMPRESSION WITH MET-RX Right 06/06/2022   Procedure: Right Lumbar three-four Minimally Invasive Surgery Laminectomy/Discectomy;  Surgeon: Jadene Pierini, MD;  Location: [facility] OR;  Service: Neurosurgery;  Laterality: Right;   WISDOM TOOTH EXTRACTION     Family History  Problem Relation Age of  Onset   Allergic rhinitis Father    Diabetes Maternal Grandmother    Stomach cancer Maternal Grandmother    Social History   Tobacco Use   Smoking status: Never    Passive exposure: Never   Smokeless tobacco: Never  Vaping Use   Vaping status: Never Used  Substance Use Topics   Alcohol use: Not Currently    Comment: Occasional- events   Drug use: Never   No Known Allergies Current Outpatient Medications on File Prior to Visit  Medication Sig Dispense Refill   azelastine (ASTELIN) 0.1 % nasal spray Place 1 spray into both nostrils 2 (two) times daily as needed. Use in each nostril as directed 30 mL 5   cetirizine (ZYRTEC ALLERGY) 10 MG tablet Take 1 tablet (10 mg total) by mouth daily as needed for allergies. 30 tablet 5   Continuous Glucose Sensor (FREESTYLE LIBRE 3 SENSOR) MISC Place 1 sensor on the skin every 14 days. Use to check glucose continuously 2 each 6   fluticasone (FLONASE) 50 MCG/ACT nasal spray Place 2 sprays into both nostrils daily. 16 g 5   insulin glargine (LANTUS SOLOSTAR) 100 UNIT/ML Solostar Pen Inject 22 Units into the skin at bedtime. 15 mL 2   Insulin Pen Needle (B-D UF III MINI PEN NEEDLES) 31G X 5 MM MISC Use as instructed. Inject into the skin once daily 100 each 3  Prenatal 28-0.8 MG TABS Take 1 tablet by mouth daily. 30 tablet 12   Accu-Chek Softclix Lancets lancets Use to check blood sugar twice daily. Dx E11.65 (Patient not taking: Reported on 02/22/2023) 100 each 3   Blood Glucose Monitoring Suppl (ACCU-CHEK GUIDE ME) w/Device KIT Use to check blood sugar twice daily. Dx E11.65 (Patient not taking: Reported on 02/22/2023) 1 kit 0   glucose blood (ACCU-CHEK GUIDE) test strip Use to check blood sugar twice daily. Dx E11.65 (Patient not taking: Reported on 02/22/2023) 100 each 12   NEOMYCIN-POLYMYXIN-HYDROCORTISONE (CORTISPORIN) 1 % SOLN OTIC solution Place 3 drops into the left ear every 8 (eight) hours. (Patient not taking: Reported on 02/27/2023) 10 mL 0    No current facility-administered medications on file prior to visit.    Review of Systems Pertinent items noted in HPI and remainder of comprehensive ROS otherwise negative.  Indications for ASA therapy (per UpToDate) One of the following: Previous pregnancy with preeclampsia, especially early onset and with an adverse outcome No Multifetal gestation No Chronic hypertension No Type 1 or 2 diabetes mellitus Yes Chronic kidney disease No Autoimmune disease (antiphospholipid syndrome, systemic lupus erythematosus) No Two or more of the following: Nulliparity Yes Obesity (body mass index >30 kg/m2) Yes Family history of preeclampsia in mother or sister No Age >=35 years No Sociodemographic characteristics (African American race, low socioeconomic level) Yes Personal risk factors (eg, previous pregnancy with low birth weight or small for gestational age infant, previous adverse pregnancy outcome [eg, stillbirth], interval >10 years between pregnancies) No  Indications for early GDM screening (FBS, A1C, Random CBG, GTT) First-degree relative with diabetes No BMI >30kg/m2 Yes Age > 35 No Previous birth of an infant weighing >=4000 g No Gestational diabetes mellitus in a previous pregnancy No Glycated hemoglobin >=5.7 percent (39 mmol/mol), impaired glucose tolerance, or impaired fasting glucose on previous testing Yes High-risk race/ethnicity (eg, African American, Latino, Native American, Asian American, Pacific Islander) Yes Previous stillbirth of unknown cause No Maternal birthweight > 9 lbs No History of cardiovascular disease No Hypertension or on therapy for hypertension No High-density lipoprotein cholesterol level <35 mg/dL (0.34 mmol/L) and/or a triglyceride level >250 mg/dL (7.42 mmol/L) No Polycystic ovary syndrome No Physical inactivity No Other clinical condition associated with insulin resistance (eg, severe obesity, acanthosis nigricans) Yes Current use of  glucocorticoids No   Physical Exam:   Vitals:   02/27/23 1010  BP: 117/77  Pulse: 71  Weight: 236 lb 6.4 oz (107.2 kg)   Fetal Heart Rate (bpm): 149 (obtained via handheld [place])  Uterine size:   Bedside Ultrasound for FHR check: Viable intrauterine pregnancy with positive cardiac activity noted, fetal heart rate 149 bpm Patient informed that the ultrasound is considered a limited obstetric ultrasound and is not intended to be a complete ultrasound exam.  Patient also informed that the ultrasound is not being completed with the intent of assessing for fetal or placental anomalies or any pelvic abnormalities.  Explained that the purpose of today's ultrasound is to assess for fetal heart rate.  Patient acknowledges the purpose of the exam and the limitations of the study. General: well-developed, well-nourished female in no acute distress     Skin: normal coloration and turgor, no rashes  Neurologic: oriented, normal, negative, normal mood  Extremities: normal strength, tone, and muscle mass, ROM of all joints is normal  HEENT PERRLA, extraocular movement intact and sclera clear, anicteric  Neck supple and no masses  Cardiovascular: regular rate and rhythm  Respiratory:  no respiratory distress, normal breath sounds  Abdomen: soft, non-tender; bowel sounds normal; no masses,  no organomegaly       Assessment:    Pregnancy: G1P0000 Patient Active Problem List   Diagnosis Date Noted   Family history of spina bifida 02/27/2023   Hx of acute cholecystitis 02/27/2023   Supervision of high risk pregnancy, antepartum 02/22/2023   Lumbar radiculopathy 06/06/2022   Syncope 03/23/2022   Acute lumbar back pain 03/18/2022   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/2021   Schizophrenia (HCC) 07/17/2021   Depression 07/17/2021   Insulin-requiring or dependent type II diabetes mellitus (HCC) 07/17/2021   Dyslipidemia 07/17/2021     Plan:    1. Supervision of high risk pregnancy in second  trimester (Primary) - Patient doing well. Excited about the pregnancy  - CBC/D/Plt+RPR+Rh+ABO+RubIgG... - Culture, OB Urine - Hemoglobin A1c - GC/Chlamydia probe amp ([place])not at [facility]  2. [redacted] weeks gestation of pregnancy - Has not felt movement, but reassured patient of normal movement expectations up to 20 weeks.  - CBC/D/Plt+RPR+Rh+ABO+RubIgG... - Culture, OB Urine - Hemoglobin A1c - GC/Chlamydia probe amp ([place])not at [facility]  3. Insulin-requiring or dependent type II diabetes mellitus (HCC) - Currently being managed with Lantus 30. Using a Libre. Recommended to tack CBG and bring to next visit.  - CBC/D/Plt+RPR+Rh+ABO+RubIgG... - Culture, OB Urine - Hemoglobin A1c - GC/Chlamydia probe amp ([place])not at [facility]  4. Encounter of female for testing for genetic disease carrier status for procreative management - HORIZON Basic Panel - PANORAMA PRENATAL TEST  6. Family history of spina bifida - Genetic testing   7. Hx of acute cholecystitis - Removal of Gallbladder in 2024 - Reviewed importance of low fat diet.   Initial labs drawn. Continue prenatal vitamins. Problem list reviewed and updated. Genetic Screening discussed, Panorama and Horizon: requested. Ultrasound discussed; fetal anatomic survey: scheduled. Anticipatory guidance about prenatal visits given including labs, ultrasounds, and testing. Weight gain recommendations per IOM guidelines reviewed: underweight/BMI 18.5 or less > 28 - 40 lbs; normal weight/BMI 18.5 - 24.9 > 25 - 35 lbs; overweight/BMI 25 - 29.9 > 15 - 25 lbs; obese/BMI  30 or more > 11 - 20 lbs. Discussed usage of the Babyscripts app for more information about pregnancy, and to track blood pressures. Also discussed usage of virtual visits as additional source of managing and completing prenatal visits.  Patient was encouraged to use MyChart to review results, send requests, and have questions addressed.   The nature of [facility]  Healthcare/Faculty Practice with multiple MDs and Advanced Practice Providers was explained to patient; also emphasized that residents, students are part of our team. Routine obstetric precautions reviewed. Encouraged to seek out care at our office or emergency room [facility] MAU preferred) for urgent and/or emergent concerns. Return in about 4 weeks (around 03/27/2023) for HROB.     Kimiyah Blick Danella Deis) Suzie Portela, MSN, CNM  [facility]  02/27/2023 1:22 PM

## 2023-02-28 LAB — CBC/D/PLT+RPR+RH+ABO+RUBIGG...
Antibody Screen: NEGATIVE
Basophils Absolute: 0 10*3/uL (ref 0.0–0.2)
Basos: 0 %
EOS (ABSOLUTE): 0.1 10*3/uL (ref 0.0–0.4)
Eos: 1 %
HCV Ab: NONREACTIVE
HIV Screen 4th Generation wRfx: NONREACTIVE
Hematocrit: 38.7 % (ref 34.0–46.6)
Hemoglobin: 13.2 g/dL (ref 11.1–15.9)
Hepatitis B Surface Ag: NEGATIVE
Immature Grans (Abs): 0.1 10*3/uL (ref 0.0–0.1)
Immature Granulocytes: 1 %
Lymphocytes Absolute: 2.4 10*3/uL (ref 0.7–3.1)
Lymphs: 24 %
MCH: 29.7 pg (ref 26.6–33.0)
MCHC: 34.1 g/dL (ref 31.5–35.7)
MCV: 87 fL (ref 79–97)
Monocytes Absolute: 0.4 10*3/uL (ref 0.1–0.9)
Monocytes: 4 %
Neutrophils Absolute: 6.8 10*3/uL (ref 1.4–7.0)
Neutrophils: 70 %
Platelets: 276 10*3/uL (ref 150–450)
RBC: 4.44 x10E6/uL (ref 3.77–5.28)
RDW: 13.1 % (ref 11.7–15.4)
RPR Ser Ql: NONREACTIVE
Rh Factor: POSITIVE
Rubella Antibodies, IGG: 0.93 {index} — ABNORMAL LOW (ref >0.99)
WBC: 9.7 10*3/uL (ref 3.4–10.8)

## 2023-02-28 LAB — HEMOGLOBIN A1C
Est. average glucose Bld gHb Est-mCnc: 137 mg/dL
Hgb A1c MFr Bld: 6.4 % — ABNORMAL HIGH (ref 4.8–5.6)

## 2023-02-28 LAB — HCV INTERPRETATION

## 2023-03-05 LAB — PANORAMA PRENATAL TEST FULL PANEL:PANORAMA TEST PLUS 5 ADDITIONAL MICRODELETIONS: FETAL FRACTION: 5.5

## 2023-03-06 ENCOUNTER — Telehealth: Payer: Self-pay | Admitting: Family Medicine

## 2023-03-06 LAB — HORIZON CUSTOM: REPORT SUMMARY: NEGATIVE

## 2023-03-06 NOTE — Telephone Encounter (Signed)
Patient called wanting to know the results from her genetic testing

## 2023-03-06 NOTE — Telephone Encounter (Signed)
Pt informed of normal genetic testing.  Pt informed that we can give her gender results in an envelope and to not open MyChart results as it is in the top.    [name]  03/06/23

## 2023-03-16 ENCOUNTER — Encounter (HOSPITAL_COMMUNITY): Payer: Self-pay | Admitting: Obstetrics and Gynecology

## 2023-03-16 ENCOUNTER — Inpatient Hospital Stay (HOSPITAL_COMMUNITY)
Admission: AD | Admit: 2023-03-16 | Discharge: 2023-03-16 | Disposition: A | Discharge status: Home / Self Care | Payer: MEDICAID | Attending: Obstetrics and Gynecology | Admitting: Obstetrics and Gynecology

## 2023-03-16 ENCOUNTER — Telehealth: Payer: Self-pay

## 2023-03-16 DIAGNOSIS — E11649 Type 2 diabetes mellitus with hypoglycemia without coma: Secondary | ICD-10-CM | POA: Diagnosis present

## 2023-03-16 DIAGNOSIS — O24312 Unspecified pre-existing diabetes mellitus in pregnancy, second trimester: Secondary | ICD-10-CM | POA: Diagnosis not present

## 2023-03-16 DIAGNOSIS — Z794 Long term (current) use of insulin: Secondary | ICD-10-CM | POA: Diagnosis not present

## 2023-03-16 DIAGNOSIS — R1031 Right lower quadrant pain: Secondary | ICD-10-CM | POA: Diagnosis present

## 2023-03-16 DIAGNOSIS — Z3A17 17 weeks gestation of pregnancy: Secondary | ICD-10-CM | POA: Diagnosis not present

## 2023-03-16 DIAGNOSIS — R42 Dizziness and giddiness: Secondary | ICD-10-CM | POA: Diagnosis present

## 2023-03-16 DIAGNOSIS — O24112 Pre-existing diabetes mellitus, type 2, in pregnancy, second trimester: Secondary | ICD-10-CM | POA: Insufficient documentation

## 2023-03-16 LAB — GLUCOSE, CAPILLARY
Glucose-Capillary: 166 mg/dL — ABNORMAL HIGH (ref 70–99)
Glucose-Capillary: 114 mg/dL — ABNORMAL HIGH (ref 70–99)

## 2023-03-16 LAB — URINALYSIS, ROUTINE W REFLEX MICROSCOPIC
Bilirubin Urine: NEGATIVE
Glucose, UA: 150 mg/dL — AB
Hgb urine dipstick: NEGATIVE
Ketones, ur: NEGATIVE mg/dL
Leukocytes,Ua: NEGATIVE
Nitrite: NEGATIVE
Protein, ur: NEGATIVE mg/dL
Specific Gravity, Urine: 1.008 (ref 1.005–1.030)
pH: 7 (ref 5.0–8.0)

## 2023-03-16 NOTE — Telephone Encounter (Signed)
 Called patient to inform that our physician advised a DM education appointment in our office. Patient verbalized understanding. Scheduled patient for DM education on 2/13 at 8:15 AM. Patient confirmed scheduled appointment.  Moira Andrews, RN

## 2023-03-16 NOTE — MAU Note (Signed)
...  Melissa Gilmore is a 28 y.o. at [redacted]w[redacted]d here in MAU reporting: Several low CBG alerts throughout the night last night since 0100. She reports she has T2DM. She reports yesterday was her sisters birthday so she ate grilled chicken, spaghetti, and had cake around 2000. She reports her blood glucose levels got up to 202 so she injected herself with 30 of her Lantus . She reports the following low sugar alerts last night. She reports she intermittently had pringles, gummies, and sweetened aloe vera juice with these alerts. When asked when she takes her Lantus , she reports, Sometimes during the day and sometimes at night when I remember. She reports occasional dizziness throughout her pregnancy as well as occasional right lower quadrant squeezing. Denies VB or LOF.  Libre reading during triage 157 mg/dL and POCT CBG reads 833 mg/dL.  Libre alerts: 0143: 66 0213: 53 0505: 58 0506: 53 0719: 69  Current Libre placed three days ago, has 11 days left.  Onset of complaint: 0143 Pain score: Denies current pain.  FHT: 153 doppler Lab orders placed from triage: POCT CBG, UA

## 2023-03-16 NOTE — MAU Provider Note (Signed)
 History     CSN: [id number]  Arrival date and time: 03/16/23 9089   Event Date/Time   First Provider Initiated Contact with Patient 03/16/23 1028      Chief Complaint  Patient presents with   Hypoglycemia   HPI Melissa Gilmore is a 28 y.o. G1P0000 at [redacted]w[redacted]d who presents for hypoglycemia. Patient has preexisting type 2 DM that was diagnosed when she was 28 yrs old. She is currently on lantus  30 units nightly; but take it various times per day b/c she can't remember when to take it.  Took last dose yesterday after eating dinner - chicken, spaghetti, & cake. Throughput the night she received notifications from her CGM that she was low. Most recently her BS was 144. Has not eaten anything today. States she doesn't always have an appetite so her meals aren't consistent. Hasn't met with diabetes educator during this pregnancy. Outside of pregnancy, reports she met with an educator when she was first diagnosed.  Denies n/v/d, excessive thirst, abdominal pain, or vaginal bleeding.   OB History     Gravida  1   Para  0   Term  0   Preterm  0   AB  0   Living  0      SAB  0   IAB  0   Ectopic  0   Multiple  0   Live Births  0           Past Medical History:  Diagnosis Date   Acute cholecystitis with chronic cholecystitis 07/16/2021   Anxiety    Childhood asthma    Depression    Major Depression disorder   Diabetes mellitus without complication [facility])    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HCC)    Headache    Migraines   History of abnormal uterine bleeding 07/17/2021   Schizophrenia (HCC)    Urticaria     Past Surgical History:  Procedure Laterality Date   CHOLECYSTECTOMY N/A 07/17/2021   Procedure: SINGLE SITE LAPAROSCOPIC CHOLECYSTECTOMY WITH INTRAOPERATIVE CHOLANGIOGRAM; LIVER BIOPSY;  Surgeon: Sheldon Standing, MD;  Location: [facility]S;  Service: General;  Laterality: N/A;   IR EPIDUROGRAPHY  03/21/2022   LUMBAR LAMINECTOMY/ DECOMPRESSION WITH MET-RX  Right 06/06/2022   Procedure: Right Lumbar three-four Minimally Invasive Surgery Laminectomy/Discectomy;  Surgeon: Cheryle Debby LABOR, MD;  Location: [facility] OR;  Service: Neurosurgery;  Laterality: Right;   WISDOM TOOTH EXTRACTION      Family History  Problem Relation Age of Onset   Allergic rhinitis Father    Diabetes Maternal Grandmother    Stomach cancer Maternal Grandmother     Social History   Tobacco Use   Smoking status: Never    Passive exposure: Never   Smokeless tobacco: Never  Vaping Use   Vaping status: Never Used  Substance Use Topics   Alcohol use: Not Currently    Comment: Occasional- events   Drug use: Never    Allergies: No Known Allergies  Medications Prior to Admission  Medication Sig Dispense Refill Last Dose/Taking   insulin  glargine (LANTUS  SOLOSTAR) 100 UNIT/ML Solostar Pen Inject 22 Units into the skin at bedtime. 15 mL 2 03/15/2023   Prenatal 28-0.8 MG TABS Take 1 tablet by mouth daily. 30 tablet 12 03/15/2023   Accu-Chek Softclix Lancets lancets Use to check blood sugar twice daily. Dx E11.65 (Patient not taking: Reported on 02/22/2023) 100 each 3    azelastine  (ASTELIN ) 0.1 % nasal spray Place 1 spray into both nostrils 2 (two) times  daily as needed. Use in each nostril as directed 30 mL 5    Blood Glucose Monitoring Suppl (ACCU-CHEK GUIDE ME) w/Device KIT Use to check blood sugar twice daily. Dx E11.65 (Patient not taking: Reported on 02/22/2023) 1 kit 0    cetirizine  (ZYRTEC  ALLERGY ) 10 MG tablet Take 1 tablet (10 mg total) by mouth daily as needed for allergies. 30 tablet 5    Continuous Glucose Sensor (FREESTYLE LIBRE 3 SENSOR) MISC Place 1 sensor on the skin every 14 days. Use to check glucose continuously 2 each 6    fluticasone  (FLONASE ) 50 MCG/ACT nasal spray Place 2 sprays into both nostrils daily. 16 g 5    glucose blood (ACCU-CHEK GUIDE) test strip Use to check blood sugar twice daily. Dx E11.65 (Patient not taking: Reported on 02/22/2023) 100 each 12     Insulin  Pen Needle (B-D UF III MINI PEN NEEDLES) 31G X 5 MM MISC Use as instructed. Inject into the skin once daily 100 each 3    NEOMYCIN -POLYMYXIN-HYDROCORTISONE (CORTISPORIN) 1 % SOLN OTIC solution Place 3 drops into the left ear every 8 (eight) hours. (Patient not taking: Reported on 02/27/2023) 10 mL 0     Review of Systems  All other systems reviewed and are negative.  Physical Exam   Blood pressure 118/74, pulse 78, temperature 98.7 F (37.1 C), temperature source Oral, resp. rate 18, height 5' 4 (1.626 m), weight 108.5 kg, last menstrual period 11/12/2022, SpO2 100%.  Physical Exam Vitals and nursing note reviewed.  Constitutional:      General: She is not in acute distress.    Appearance: She is well-developed. She is not ill-appearing.  HENT:     Head: Normocephalic and atraumatic.  Eyes:     General: No scleral icterus.       Right eye: No discharge.        Left eye: No discharge.     Conjunctiva/sclera: Conjunctivae normal.  Pulmonary:     Effort: Pulmonary effort is normal. No respiratory distress.  Neurological:     General: No focal deficit present.     Mental Status: She is alert.  Psychiatric:        Mood and Affect: Mood normal.        Behavior: Behavior normal.     MAU Course  Procedures Results for orders placed or performed during the hospital encounter of 03/16/23 (from the past 24 hours)  Glucose, capillary     Status: Abnormal   Collection Time: 03/16/23  9:23 AM  Result Value Ref Range   Glucose-Capillary 166 (H) 70 - 99 mg/dL  Urinalysis, Routine w reflex microscopic -Urine, Clean Catch     Status: Abnormal   Collection Time: 03/16/23  9:26 AM  Result Value Ref Range   Color, Urine STRAW (A) YELLOW   APPearance CLEAR CLEAR   Specific Gravity, Urine 1.008 1.005 - 1.030   pH 7.0 5.0 - 8.0   Glucose, UA 150 (A) NEGATIVE mg/dL   Hgb urine dipstick NEGATIVE NEGATIVE   Bilirubin Urine NEGATIVE NEGATIVE   Ketones, ur NEGATIVE NEGATIVE mg/dL    Protein, ur NEGATIVE NEGATIVE mg/dL   Nitrite NEGATIVE NEGATIVE   Leukocytes,Ua NEGATIVE NEGATIVE  Glucose, capillary     Status: Abnormal   Collection Time: 03/16/23 10:23 AM  Result Value Ref Range   Glucose-Capillary 114 (H) 70 - 99 mg/dL    MDM CBG x 2 U/a Chart review completed  Assessment and Plan   1. Preexisting diabetes complicating pregnancy,  antepartum, second trimester   2. [redacted] weeks gestation of pregnancy    -Fasting BS in MAU is 166.  -Message sent to office to schedule DE appt asap. Patient received call from office during MAU stay & is scheduled for appt next Thursday -Discussed eating habits with diabetes, especially during pregnancy. Important not to skip meals & to get protein with carbs. Hypoglycemic episode likely related to diet. Discussed eating small bedtime meal with carb & protein.  -Reviewed reasons to return to MAU  Rocky Satterfield 03/16/2023, 10:28 AM

## 2023-03-16 NOTE — MAU Note (Signed)
 Patient called out stating her blood glucose is dropping. POCT CBG 114. Libre states 77.  NP made aware. Saltine crackers, peanut butter, and water  given.

## 2023-03-21 ENCOUNTER — Other Ambulatory Visit: Payer: Self-pay | Admitting: *Deleted

## 2023-03-21 DIAGNOSIS — O24312 Unspecified pre-existing diabetes mellitus in pregnancy, second trimester: Secondary | ICD-10-CM

## 2023-03-21 DIAGNOSIS — O0992 Supervision of high risk pregnancy, unspecified, second trimester: Secondary | ICD-10-CM

## 2023-03-22 ENCOUNTER — Other Ambulatory Visit: Payer: Self-pay

## 2023-03-22 ENCOUNTER — Encounter: Payer: MEDICAID | Attending: Family Medicine | Admitting: Dietician

## 2023-03-22 ENCOUNTER — Ambulatory Visit: Payer: MEDICAID

## 2023-03-22 DIAGNOSIS — O0992 Supervision of high risk pregnancy, unspecified, second trimester: Secondary | ICD-10-CM

## 2023-03-22 DIAGNOSIS — R519 Headache, unspecified: Secondary | ICD-10-CM | POA: Insufficient documentation

## 2023-03-22 DIAGNOSIS — R109 Unspecified abdominal pain: Secondary | ICD-10-CM | POA: Insufficient documentation

## 2023-03-22 DIAGNOSIS — O208 Other hemorrhage in early pregnancy: Secondary | ICD-10-CM | POA: Diagnosis not present

## 2023-03-22 DIAGNOSIS — O26892 Other specified pregnancy related conditions, second trimester: Secondary | ICD-10-CM | POA: Diagnosis present

## 2023-03-22 DIAGNOSIS — O24312 Unspecified pre-existing diabetes mellitus in pregnancy, second trimester: Secondary | ICD-10-CM

## 2023-03-22 DIAGNOSIS — Z3A18 18 weeks gestation of pregnancy: Secondary | ICD-10-CM

## 2023-03-22 DIAGNOSIS — Z3A20 20 weeks gestation of pregnancy: Secondary | ICD-10-CM | POA: Insufficient documentation

## 2023-03-22 NOTE — Progress Notes (Signed)
Patient was seen for  Pre-existing Diabetes During Pregnancy) on 03/22/2023  Start time 0815 and End time 0901  Estimated due date: 08/19/2023; [redacted]w[redacted]d  Clinical: Medications:  Current Outpatient Medications:    cetirizine (ZYRTEC ALLERGY) 10 MG tablet, Take 1 tablet (10 mg total) by mouth daily as needed for allergies., Disp: 30 tablet, Rfl: 5   Continuous Glucose Sensor (FREESTYLE LIBRE 3 SENSOR) MISC, Place 1 sensor on the skin every 14 days. Use to check glucose continuously, Disp: 2 each, Rfl: 6   fluticasone (FLONASE) 50 MCG/ACT nasal spray, Place 2 sprays into both nostrils daily., Disp: 16 g, Rfl: 5   insulin glargine (LANTUS SOLOSTAR) 100 UNIT/ML Solostar Pen, Inject 22 Units into the skin at bedtime., Disp: 15 mL, Rfl: 2   Insulin Pen Needle (B-D UF III MINI PEN NEEDLES) 31G X 5 MM MISC, Use as instructed. Inject into the skin once daily, Disp: 100 each, Rfl: 3   Prenatal 28-0.8 MG TABS, Take 1 tablet by mouth daily., Disp: 30 tablet, Rfl: 12   Accu-Chek Softclix Lancets lancets, Use to check blood sugar twice daily. Dx E11.65 (Patient not taking: Reported on 02/22/2023), Disp: 100 each, Rfl: 3   azelastine (ASTELIN) 0.1 % nasal spray, Place 1 spray into both nostrils 2 (two) times daily as needed. Use in each nostril as directed, Disp: 30 mL, Rfl: 5   Blood Glucose Monitoring Suppl (ACCU-CHEK GUIDE ME) w/Device KIT, Use to check blood sugar twice daily. Dx E11.65 (Patient not taking: Reported on 02/22/2023), Disp: 1 kit, Rfl: 0   glucose blood (ACCU-CHEK GUIDE) test strip, Use to check blood sugar twice daily. Dx E11.65 (Patient not taking: Reported on 02/22/2023), Disp: 100 each, Rfl: 12   NEOMYCIN-POLYMYXIN-HYDROCORTISONE (CORTISPORIN) 1 % SOLN OTIC solution, Place 3 drops into the left ear every 8 (eight) hours. (Patient not taking: Reported on 02/27/2023), Disp: 10 mL, Rfl: 0  Medical History:  Past Medical History:  Diagnosis Date   Acute cholecystitis with chronic cholecystitis  07/16/2021   Anxiety    Childhood asthma    Depression    Major Depression disorder   Diabetes mellitus without complication [facility])    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HCC)    Headache    Migraines   History of abnormal uterine bleeding 07/17/2021   Schizophrenia (HCC)    Urticaria     Labs:  Lab Results  Component Value Date   HGBA1C 6.4 (H) 02/27/2023    Dietary and Lifestyle History: Pt present today alone. Pt reports taking 30 units of Lantus sometimes before bed and other time in the morning-CDCES encouraged Pt to take near the same hour daily. Pt reports she can't drink water during this pregnancy and states she becomes sick. Pt c/o of hypoglycemia occurring at night stating CGM alarms. Pt reports she lives with her fiance and she does all the shopping and cooking  All Pt's questions were answered during this encounter.   Physical Activity: walks daily for 10-30 minutes Stress: 8 out of 10 / self care includes tv, emotional support cat Sleep: good  24 hr Recall:  First Meal: skips  Snack: none Second meal: peanut butter and jelly sandwich, milk, strawberries  Snack: none Third meal: 4 flour tortillas with meat, onions, cilantro, salsa, jorrito soda Snack: none Beverages: regular soda, milk, water, juice  NUTRITION INTERVENTION  Nutrition education (E-1) on the following topics:   Initial Follow-up  [x]  []  Definition of Gestational Diabetes [x]  []  Why dietary management is important in controlling  blood glucose [x]  []  Effects each nutrient has on blood glucose levels [x]  []  Simple carbohydrates vs complex carbohydrates [x]  []  Fluid intake [x]  []  Creating a balanced meal plan [x]  []  Carbohydrate counting  [x]  []  When to check blood glucose levels [x]  []  Proper blood glucose monitoring techniques [x]  []  Effect of stress and stress reduction techniques  [x]  []  Exercise effect on blood glucose levels, appropriate exercise during pregnancy [x]  []  Importance of  limiting caffeine and abstaining from alcohol and smoking [x]  []  Medications used for blood sugar control during pregnancy [x]  []  Hypoglycemia and rule of 15     Patient has a meter (CGM Libre 3) prior to visit. Patient is instructed to test pre breakfast and 2 hours after each meal. CGM Results from download: Libre 3  % Time CGM active:   91 %   (Goal >70%)  Average glucose:   140 mg/dL for 7 days  Glucose management indicator:   6.7 %  Time in range (70-140 mg/dL):   16% %   (Goal >10%)  Time High (141-250 mg/dL):   42 %   (Goal < 96%)  Time Very High (>250 mg/dL):    0 %   (Goal < 5%)  Time Low (54-69 mg/dL):   0 %   (Goal <0%)  Time Very Low (<54 mg/dL):   0 %   (Goal <4%)   Patient instructed to monitor glucose levels: QID FBS: 60 - <= 95 mg/dL; 2 hour: <= 540 mg/dL  Patient received handouts: Nutrition Diabetes and Pregnancy Carbohydrate Counting List Blood glucose log Snack ideas for diabetes during pregnancy  Patient will be seen for follow-up as needed.

## 2023-03-23 ENCOUNTER — Ambulatory Visit: Payer: MEDICAID | Admitting: Obstetrics & Gynecology

## 2023-03-23 VITALS — BP 119/80 | HR 84 | Wt 231.0 lb

## 2023-03-23 DIAGNOSIS — Z3A18 18 weeks gestation of pregnancy: Secondary | ICD-10-CM | POA: Diagnosis not present

## 2023-03-23 DIAGNOSIS — O0992 Supervision of high risk pregnancy, unspecified, second trimester: Secondary | ICD-10-CM | POA: Diagnosis not present

## 2023-03-23 DIAGNOSIS — O24312 Unspecified pre-existing diabetes mellitus in pregnancy, second trimester: Secondary | ICD-10-CM | POA: Diagnosis not present

## 2023-03-23 DIAGNOSIS — Z8279 Family history of other congenital malformations, deformations and chromosomal abnormalities: Secondary | ICD-10-CM

## 2023-03-23 MED ORDER — LANTUS SOLOSTAR 100 UNIT/ML ~~LOC~~ SOPN: 35.0000 [IU] | PEN_INJECTOR | Freq: Every day | SUBCUTANEOUS | 2 refills | Status: DC

## 2023-03-23 NOTE — Progress Notes (Signed)
   PRENATAL VISIT NOTE  Subjective:  Melissa Gilmore is a 28 y.o. G1P0000 at [redacted]w[redacted]d being seen today for ongoing prenatal care.  She is currently monitored for the following issues for this high-risk pregnancy and has 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HCC); Schizophrenia (HCC); Depression; Insulin-requiring or dependent type II diabetes mellitus (HCC); Dyslipidemia; Acute lumbar back pain; Syncope; Lumbar radiculopathy; Supervision of high risk pregnancy, antepartum; Family history of spina bifida; and Hx of acute cholecystitis on their problem list.  Patient reports no complaints.  Contractions: Not present. Vag. Bleeding: None.  Movement: Present. Denies leaking of fluid.   The following portions of the patient's history were reviewed and updated as appropriate: allergies, current medications, past family history, past medical history, past social history, past surgical history and problem list.   Objective:   Vitals:   03/23/23 1127  BP: 119/80  Pulse: 84  Weight: 231 lb (104.8 kg)    Fetal Status: Fetal Heart Rate (bpm): 150   Movement: Present     General:  Alert, oriented and cooperative. Patient is in no acute distress.  Skin: Skin is warm and dry. No rash noted.   Cardiovascular: Normal heart rate noted  Respiratory: Normal respiratory effort, no problems with respiration noted  Abdomen: Soft, gravid, appropriate for gestational age.  Pain/Pressure: Present (Lower abd pressure)     Pelvic: Cervical exam deferred        Extremities: Normal range of motion.  Edema: None  Mental Status: Normal mood and affect. Normal behavior. Normal judgment and thought content.   Assessment and Plan:  Pregnancy: G1P0000 at [redacted]w[redacted]d 1. Preexisting diabetes complicating pregnancy, antepartum, second trimester (Primary) Fastings in 110s, PP 120-140s.  Increased Lantus to 35 units [place] at bedtime, will reevaluate in two weeks. On [employer].  Fetal ECHO ordered.  Continue close  monitoring. - insulin glargine (LANTUS SOLOSTAR) 100 UNIT/ML Solostar Pen; Inject 35 Units into the skin at bedtime.  Dispense: 15 mL; Refill: 2 - Ambulatory referral to Pediatric Cardiology  2. Family history of spina bifida - AFP, Serum, Open Spina Bifida done today, already scheduled for detailed anatomy scan  3. [redacted] weeks gestation of pregnancy 4. Supervision of high risk pregnancy in second trimester - AFP, Serum, Open Spina Bifida done today, already scheduled for detailed anatomy scan  Preterm labor symptoms and general obstetric precautions including but not limited to vaginal bleeding, contractions, leaking of fluid and fetal movement were reviewed in detail with the patient. Please refer to After Visit Summary for other counseling recommendations.   Return in about 2 weeks (around 04/06/2023) for OFFICE OB VISIT (MD only).  Future Appointments  Date Time Provider Department Center  03/27/2023  1:15 PM [facility] NURSE [facility]  03/27/2023  1:30 PM [facility]  03/28/2023  2:45 PM Luis Abed, PT [facility]  04/11/2023 11:15 AM Birder Robson, MD [facility] None  04/16/2023  2:20 PM Georganna Skeans, MD [facility] None    Jaynie Collins, MD

## 2023-03-25 ENCOUNTER — Encounter: Payer: Self-pay | Admitting: Obstetrics & Gynecology

## 2023-03-25 DIAGNOSIS — O24119 Pre-existing diabetes mellitus, type 2, in pregnancy, unspecified trimester: Secondary | ICD-10-CM | POA: Insufficient documentation

## 2023-03-25 DIAGNOSIS — O24319 Unspecified pre-existing diabetes mellitus in pregnancy, unspecified trimester: Secondary | ICD-10-CM | POA: Insufficient documentation

## 2023-03-25 DIAGNOSIS — O280 Abnormal hematological finding on antenatal screening of mother: Secondary | ICD-10-CM | POA: Insufficient documentation

## 2023-03-25 LAB — AFP, SERUM, OPEN SPINA BIFIDA
AFP MoM: 2.29
AFP Value: 65.3 ng/mL
Gest. Age on Collection Date: 18.7 wk
Maternal Age At EDD: 26.7 a
OSBR Risk 1 IN: 130
Test Results:: POSITIVE — AB
Weight: 237 [lb_av]

## 2023-03-25 NOTE — Progress Notes (Signed)
Abnormal AFP screen with increased risk of open spina bifida (OSB) of 1 in 130 noted, patient has a known family history of OSB. She is already scheduled for detailed anatomy scan on 03/27/23, notes about abnormal AFP added to the appointment and problem list updated.  Please call to inform patient of results and recommendations; emphasize that this is a screening test and definitive diagnosis can only be made on ultrasound and further evaluation.   Jaynie Collins, MD

## 2023-03-26 ENCOUNTER — Ambulatory Visit: Payer: MEDICAID | Admitting: Family Medicine

## 2023-03-27 ENCOUNTER — Other Ambulatory Visit: Payer: Self-pay

## 2023-03-27 ENCOUNTER — Encounter: Payer: Self-pay | Admitting: *Deleted

## 2023-03-27 ENCOUNTER — Ambulatory Visit: Payer: MEDICAID

## 2023-03-27 ENCOUNTER — Ambulatory Visit: Payer: MEDICAID | Attending: Certified Nurse Midwife

## 2023-03-27 ENCOUNTER — Ambulatory Visit: Payer: MEDICAID | Admitting: Obstetrics and Gynecology

## 2023-03-27 VITALS — BP 124/80 | HR 77

## 2023-03-27 DIAGNOSIS — Z794 Long term (current) use of insulin: Secondary | ICD-10-CM

## 2023-03-27 DIAGNOSIS — E6689 Other obesity not elsewhere classified: Secondary | ICD-10-CM | POA: Diagnosis not present

## 2023-03-27 DIAGNOSIS — Z8279 Family history of other congenital malformations, deformations and chromosomal abnormalities: Secondary | ICD-10-CM

## 2023-03-27 DIAGNOSIS — Z7982 Long term (current) use of aspirin: Secondary | ICD-10-CM | POA: Insufficient documentation

## 2023-03-27 DIAGNOSIS — O280 Abnormal hematological finding on antenatal screening of mother: Secondary | ICD-10-CM | POA: Insufficient documentation

## 2023-03-27 DIAGNOSIS — Z363 Encounter for antenatal screening for malformations: Secondary | ICD-10-CM | POA: Diagnosis present

## 2023-03-27 DIAGNOSIS — O99212 Obesity complicating pregnancy, second trimester: Secondary | ICD-10-CM | POA: Diagnosis not present

## 2023-03-27 DIAGNOSIS — O0990 Supervision of high risk pregnancy, unspecified, unspecified trimester: Secondary | ICD-10-CM | POA: Diagnosis not present

## 2023-03-27 DIAGNOSIS — O99342 Other mental disorders complicating pregnancy, second trimester: Secondary | ICD-10-CM

## 2023-03-27 DIAGNOSIS — E669 Obesity, unspecified: Secondary | ICD-10-CM | POA: Diagnosis not present

## 2023-03-27 DIAGNOSIS — Z361 Encounter for antenatal screening for raised alphafetoprotein level: Secondary | ICD-10-CM | POA: Diagnosis not present

## 2023-03-27 DIAGNOSIS — F251 Schizoaffective disorder, depressive type: Secondary | ICD-10-CM

## 2023-03-27 DIAGNOSIS — O24112 Pre-existing diabetes mellitus, type 2, in pregnancy, second trimester: Secondary | ICD-10-CM

## 2023-03-27 DIAGNOSIS — Z3A19 19 weeks gestation of pregnancy: Secondary | ICD-10-CM | POA: Insufficient documentation

## 2023-03-27 DIAGNOSIS — E119 Type 2 diabetes mellitus without complications: Secondary | ICD-10-CM

## 2023-03-27 DIAGNOSIS — Z3A14 14 weeks gestation of pregnancy: Secondary | ICD-10-CM | POA: Diagnosis not present

## 2023-03-27 DIAGNOSIS — F4481 Dissociative identity disorder: Secondary | ICD-10-CM

## 2023-03-27 NOTE — Therapy (Signed)
OUTPATIENT PHYSICAL THERAPY THORACOLUMBAR EVALUATION   Patient Name: Melissa Gilmore MRN: [id number] DOB:[birth year], 28 y.o., female Today's Date: 03/28/2023  END OF SESSION:  PT End of Session - 03/28/23 1636     Visit Number 1    Number of Visits 13    Date for PT Re-Evaluation 05/18/23    Authorization Type [employer]    PT Start Time 1021    PT Stop Time 1107    PT Time Calculation (min) 46 min    Activity Tolerance Patient tolerated treatment well    Behavior During Therapy [facility] for tasks assessed/performed             Past Medical History:  Diagnosis Date   Acute cholecystitis with chronic cholecystitis 07/16/2021   Acute lumbar back pain 03/18/2022   Anxiety    Childhood asthma    Depression    Major Depression disorder   Diabetes mellitus without complication (HCC)    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HCC)    Dyslipidemia 07/17/2021   Headache    Migraines   History of abnormal uterine bleeding 07/17/2021   Schizophrenia (HCC)    Urticaria    Past Surgical History:  Procedure Laterality Date   CHOLECYSTECTOMY N/A 07/17/2021   Procedure: SINGLE SITE LAPAROSCOPIC CHOLECYSTECTOMY WITH INTRAOPERATIVE CHOLANGIOGRAM; LIVER BIOPSY;  Surgeon: Karie Soda, MD;  Location: [facility];  Service: General;  Laterality: N/A;   IR EPIDUROGRAPHY  03/21/2022   LUMBAR LAMINECTOMY/ DECOMPRESSION WITH MET-RX Right 06/06/2022   Procedure: Right Lumbar three-four Minimally Invasive Surgery Laminectomy/Discectomy;  Surgeon: Jadene Pierini, MD;  Location: [facility] OR;  Service: Neurosurgery;  Laterality: Right;   WISDOM TOOTH EXTRACTION     Patient Active Problem List   Diagnosis Date Noted   Abnormal antenatal AFP screen 03/25/2023   Preexisting diabetes complicating pregnancy, antepartum 03/25/2023   Family history of spina bifida 02/27/2023   Supervision of high risk pregnancy, antepartum 02/22/2023   Lumbar radiculopathy 06/06/2022   Syncope 03/23/2022    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/2021   Schizophrenia (HCC) 07/17/2021   Depression 07/17/2021   Insulin-requiring or dependent type II diabetes mellitus (HCC) 07/17/2021    PCP: Georganna Skeans, MD   REFERRING PROVIDER: Arman Bogus, MD   REFERRING DIAG: M54.5: Low back pain  Rationale for Evaluation and Treatment: Rehabilitation  THERAPY DIAG:  Chronic right-sided low back pain with right-sided sciatica  Difficulty in walking, not elsewhere classified  ONSET DATE: For approx 2 monhs  SUBJECTIVE:  SUBJECTIVE STATEMENT: Pt reports approx 2 months ago she started developing R low back with intermittent R LE pain and N/T, 2 days/week. Pt associates her increase in pain with being pregnant. Pt is currently 5 months pregnant. Pt notes the R LE pain and N/T occurs primarily when lying down and she has difficulty with ascending the 1st step for a flight of stairs, for which she receives physical assistance. Pt has a Hx of Right Lumbar three-four Minimally Invasive Surgery Laminectomy/Discectomy completed on 06/06/23. Following surgery and prior to pregnancy, she notes her low back and R LE pain only occurred infrequently and resolved within 24 hrs with taking ibuprofen.  PERTINENT HISTORY:  Right Lumbar three-four Minimally Invasive Surgery Laminectomy/Discectomy completed on 06/06/23. See PMH.  PAIN:  Are you having pain? Yes: NPRS scale: 4/10. Pain range the week prior to starting PT: 0-6/10 Pain location: low back, intermittent R leg pain N/T (2x per week, esp with lying down. Pt lies in her sides) Pain- ant thigh, N/T-ant lower leg. Pain description: ache, squeeze  Aggravating factors: Lying down Relieving factors: Sitting, pregnancy pillow  PRECAUTIONS: None  RED  FLAGS: None   WEIGHT BEARING RESTRICTIONS: No  FALLS:  Has patient fallen in last 6 months? No  LIVING ENVIRONMENT: Lives with: lives with their family Lives in: [employer] assistance with initial first step with asceneding  OCCUPATION: Not working  PLOF: Independent  PATIENT GOALS: Pain management  NEXT MD VISIT: To see Dr. Yetta Barre if needed  OBJECTIVE:  Note: Objective measures were completed at Evaluation unless otherwise noted.  DIAGNOSTIC FINDINGS:  Lumbar Spine 04/27/2022  MPRESSION: 1. Right subarticular disc protrusion with inferior migration at L3-4, impinging upon the descending right L4 nerve root in the right lateral recess. This is similar but perhaps slightly decreased in size as compared to previous MRI from 03/16/2022. 2. Central to left foraminal disc protrusion at L2-3 with resultant mild left lateral recess stenosis. 3. Central disc protrusion at L5-S1 without significant stenosis or impingement.  PATIENT SURVEYS:  Modified Oswestry 3/50= 6%   COGNITION: Overall cognitive status: Within functional limits for tasks assessed     SENSATION: WFL  MUSCLE LENGTH: Hamstrings: Right NT deg; Left NT deg Maisie Fus test: Right NT deg; Left NT deg  POSTURE: rounded shoulders and forward head  PALPATION: Not TTP to the low back area  LUMBAR ROM:  NT AROM eval  Flexion   Extension   Right lateral flexion   Left lateral flexion   Right rotation   Left rotation    (Blank rows = not tested)  LOWER EXTREMITY ROM:    Grossly WNL's Active  Right eval Left eval  Hip flexion    Hip extension    Hip abduction    Hip adduction    Hip internal rotation    Hip external rotation    Knee flexion    Knee extension    Ankle dorsiflexion    Ankle plantarflexion    Ankle inversion    Ankle eversion     (Blank rows = not tested)  LOWER EXTREMITY MMT:   Myotome screen negative MMT Right eval Left eval  Hip flexion 4+ 4+  Hip  extension    Hip abduction 4+ 4+  Hip adduction    Hip internal rotation    Hip external rotation 4+ 4+  Knee flexion 5 5  Knee extension    Ankle dorsiflexion 5 5  Ankle plantarflexion 5 5  Ankle inversion    Ankle  eversion     (Blank rows = not tested)  LUMBAR SPECIAL TESTS:  Straight leg raise test: Negative, Slump test: Negative, and SI Compression/distraction test: Negative  FUNCTIONAL TESTS:  NT  GAIT: Distance walked: 200' Assistive device utilized: None Level of assistance: Complete Independence Comments: WNLs  TREATMENT DATE:  [facility] Adult PT Treatment:                                                DATE: 03/28/23 Therapeutic Exercise: Developed, instructed in, and pt completed therex as noted in HEP                                                                                                                         PATIENT EDUCATION:  Education details: Eval findings, POC, HEP, self care  Person educated: Patient Education method: Explanation, Demonstration, Tactile cues, Verbal cues, and Handouts Education comprehension: verbalized understanding, returned demonstration, verbal cues required, and tactile cues required  HOME EXERCISE PROGRAM: Access Code: [email or web address] URL: [email or web address] Date: 03/28/2023 Prepared by: Joellyn Rued  Exercises - Supine Pelvic Rocking  - 2 x daily - 7 x weekly - 1 sets - 10 reps - Supine Posterior Pelvic Tilt with Pelvic Floor Contraction  - 2 x daily - 7 x weekly - 1 sets - 10 reps - 3 hold - Supine Bridge  - 2 x daily - 7 x weekly - 1 sets - 10 reps - 3 hold - Cat Cow  - 2 x daily - 7 x weekly - 3 sets - 10 reps - 5-10 hold - Child's Pose Stretch  - 2 x daily - 7 x weekly - 3 sets - 10 reps - 5-10 hold  ASSESSMENT:  CLINICAL IMPRESSION: Patient is a 28 y.o. female who was seen today for physical therapy evaluation and treatment for M54.5: Low back pain. Pt presents to PT with low back and R LE pain associated  with pregnancy of 5 months. A HEP was initiated to address mobility and core strengthening to assist with pain management. At end of session, pt reported a decrease in low back pain from 4/10 to 2/10. Pt will benefit from skilled PT 2w6 to address impairments to optimize back function with less pain.   OBJECTIVE IMPAIRMENTS: decreased activity tolerance, difficulty walking, decreased strength, obesity, pain, and pregnancy .   ACTIVITY LIMITATIONS: carrying, lifting, bending, standing, and sleeping  PARTICIPATION LIMITATIONS: meal prep, cleaning, laundry, and community activity  PERSONAL FACTORS: Fitness, Past/current experiences, and 1 comorbidity: High BMI  are also affecting patient's functional outcome.   REHAB POTENTIAL: Good  CLINICAL DECISION MAKING: Evolving/moderate complexity  EVALUATION COMPLEXITY: Moderate   GOALS:   SHORT TERM GOALS = LTGs  LONG TERM GOALS: Target date: 05/18/23  Pt will be Ind in a final HEP to assist in the management of her low  back pain during pregnancy  Baseline: Initiated Goal status: INITIAL  2.  Pt will report overall improvement in low back pain of 50% or greater for improved ability to function and QOL Baseline:1-8/10  Goal status: INITIAL  3.  Pt will voice understanding of measures to assist in the management of her low back pain Baseline:  Goal status: INITIAL  PLAN:  PT FREQUENCY: 2x/week  PT DURATION: 6 weeks  PLANNED INTERVENTIONS: 97164- PT Re-evaluation, 97110-Therapeutic exercises, 97530- Therapeutic activity, 97535- Self Care, 16109- Manual therapy, Patient/Family education, Taping, Cryotherapy, and Moist heat.  PLAN FOR NEXT SESSION: Assess response to HEP; progress therex as indicated; use of manual therapy as indicated.  Leona Pressly MS, PT 03/29/23 11:30 AM

## 2023-03-27 NOTE — Progress Notes (Signed)
  Maternal-Fetal Medicine Consultation I had the pleasure of seeing Ms[facility] Jocie Mer[facility]. She is G1 P0 at 19-weeks' gestation and is here for fetal anatomy scan.  Her high risk problems include -Type 2 diabetes.  Patient takes Lantus insulin 35 units at night and reports her fasting levels are still slightly increased.  Postprandial levels are within normal range.  Recent hemoglobin A1c (4 weeks ago) was 6.4% and it was 11% about 5 months ago. -Increased MSAFP on screening (2.29 MoM and OSB are 1 and 130). -Pregravid BMI 40.  Patient does not have hypertension or any other chronic medical conditions. On cell-free fetal DNA screening, the risks of fetal aneuploidies are not increased.  Her pregnancy is dated by LMP date consistent with 12-week ultrasound (5 days difference).  Her sister has spina bifida and self-catheterizes herself.  Ultrasound We performed a fetal anatomical survey.  Amniotic fluid is normal and good fetal activity seen.  Fetal biometry lags gestational age by 8 days.  No markers of aneuploidies or obvious fetal structural defects are seen.  Intracranial structures and fetal spine appear normal.  Abdominal wall appears normal with no evidence of omphalocele or gastroschisis.  Increased MSAFP screening results I reassured the patient of normal intracranial structures and fetal spine on ultrasound.  Ultrasound detects 95 out of 100 cases of spina bifida and has a greater accuracy than serum screening.  Increased AFP can also be associated with fetal growth restriction, preterm delivery, rarely stillbirth.  I explained potential fetal growth restriction (fetal biometry, however, is consistent with the early ultrasound findings).  Type 2 diabetes Possible complications of diabetes including increased congenital malformations, fetal macrosomia, shoulder dystocia and neurological injuries at delivery, adverse fetal and neonatal  outcomes including stillbirth, respiratory distress syndrome and increased NICU complications. I discussed normal glucose parameters and encouraged her to check her blood glucose regularly.  Her hemoglobin A1c was increased at the [place] conception.  And a hemoglobin A1c of 11% is associated with increased congenital malformations (up to 10%).  I recommended fetal echocardiography.  Patient informed that your office had set up an appointment for fetal echocardiography.  I discussed ultrasound protocol in the management of pregnancy is complicated with diabetes. Diabetes increases the risk of preeclampsia.  Patient takes low-dose aspirin prophylaxis that delays or prevents preeclampsia.  Recommendations -An appointment was made for her to return in 4 weeks for fetal growth assessment. -We will revisit her EDD based on ultrasound measurements. -Fetal echocardiography (appointment made at your office). Fetal growth assessments every 4 weeks till delivery. -Weekly BPP from [redacted] weeks gestation till delivery.  Consultation including face-to-face (more than 50%) counseling 30 minutes.

## 2023-03-28 ENCOUNTER — Other Ambulatory Visit: Payer: Self-pay

## 2023-03-28 ENCOUNTER — Other Ambulatory Visit: Payer: Self-pay | Admitting: *Deleted

## 2023-03-28 ENCOUNTER — Other Ambulatory Visit (HOSPITAL_COMMUNITY): Payer: Self-pay

## 2023-03-28 ENCOUNTER — Telehealth: Payer: Self-pay

## 2023-03-28 ENCOUNTER — Ambulatory Visit: Payer: MEDICAID | Attending: Neurological Surgery

## 2023-03-28 DIAGNOSIS — G8929 Other chronic pain: Secondary | ICD-10-CM | POA: Diagnosis present

## 2023-03-28 DIAGNOSIS — O99212 Obesity complicating pregnancy, second trimester: Secondary | ICD-10-CM

## 2023-03-28 DIAGNOSIS — M5441 Lumbago with sciatica, right side: Secondary | ICD-10-CM | POA: Diagnosis present

## 2023-03-28 DIAGNOSIS — R262 Difficulty in walking, not elsewhere classified: Secondary | ICD-10-CM | POA: Diagnosis present

## 2023-03-28 DIAGNOSIS — R772 Abnormality of alphafetoprotein: Secondary | ICD-10-CM

## 2023-03-28 NOTE — Telephone Encounter (Addendum)
-----   Message from [place] sent at 03/25/2023 12:07 PM EST ----- Abnormal AFP screen with increased risk of open spina bifida (OSB) of 1 in 130 noted, patient has a known family history of OSB. She is already scheduled for detailed anatomy scan on 03/27/23, notes about abnormal AFP added to the appointment and problem list updated.  Please call to inform patient of results and recommendations; emphasize that this is a screening test and definitive diagnosis can only be made on ultrasound and further evaluation.   Jaynie Collins, MD  Called pt with [employer], and informed pt results and that we can schedule appt with genetic counselor.   Pt stated that her detailed [place] was normal.  Pt stated that she would like to have genetic counseling appt. . Pt scheduled with [place] on 04/24/23 for genetic counseling.  Pt verbalized understanding with no further questions.   Addison Naegeli, RN  03/28/23

## 2023-03-28 NOTE — Telephone Encounter (Signed)
Pharmacy Patient Advocate Encounter  Received notification from [facility] that Prior Authorization for FREESTYLE LIBRE 3 SENSOR has been APPROVED from 03/27/2023 to 03/26/2024   PA #/Case ID/Reference #: [id number]

## 2023-03-29 ENCOUNTER — Other Ambulatory Visit: Payer: Self-pay

## 2023-04-02 ENCOUNTER — Encounter: Payer: Self-pay | Admitting: Family Medicine

## 2023-04-04 ENCOUNTER — Encounter: Payer: Self-pay | Admitting: Physical Therapy

## 2023-04-04 ENCOUNTER — Ambulatory Visit: Payer: MEDICAID | Admitting: Physical Therapy

## 2023-04-04 DIAGNOSIS — G8929 Other chronic pain: Secondary | ICD-10-CM

## 2023-04-04 DIAGNOSIS — M5441 Lumbago with sciatica, right side: Secondary | ICD-10-CM | POA: Diagnosis not present

## 2023-04-04 DIAGNOSIS — R262 Difficulty in walking, not elsewhere classified: Secondary | ICD-10-CM

## 2023-04-04 NOTE — Therapy (Signed)
 OUTPATIENT PHYSICAL THERAPY THORACOLUMBAR NOTE   Patient Name: Melissa Gilmore MRN: [id number] DOB:1995/05/27, 28 y.o., female Today's Date: 04/04/2023  END OF SESSION:    Past Medical History:  Diagnosis Date   Acute cholecystitis with chronic cholecystitis 07/16/2021   Acute lumbar back pain 03/18/2022   Anxiety    Childhood asthma    Depression    Major Depression disorder   Diabetes mellitus without complication [facility])    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HCC)    Dyslipidemia 07/17/2021   Headache    Migraines   History of abnormal uterine bleeding 07/17/2021   Schizophrenia (HCC)    Urticaria    Past Surgical History:  Procedure Laterality Date   CHOLECYSTECTOMY N/A 07/17/2021   Procedure: SINGLE SITE LAPAROSCOPIC CHOLECYSTECTOMY WITH INTRAOPERATIVE CHOLANGIOGRAM; LIVER BIOPSY;  Surgeon: Karie Soda, MD;  Location: [facility];  Service: General;  Laterality: N/A;   IR EPIDUROGRAPHY  03/21/2022   LUMBAR LAMINECTOMY/ DECOMPRESSION WITH MET-RX Right 06/06/2022   Procedure: Right Lumbar three-four Minimally Invasive Surgery Laminectomy/Discectomy;  Surgeon: Jadene Pierini, MD;  Location: [facility] OR;  Service: Neurosurgery;  Laterality: Right;   WISDOM TOOTH EXTRACTION     Patient Active Problem List   Diagnosis Date Noted   Abnormal antenatal AFP screen 03/25/2023   Preexisting diabetes complicating pregnancy, antepartum 03/25/2023   Family history of spina bifida 02/27/2023   Supervision of high risk pregnancy, antepartum 02/22/2023   Lumbar radiculopathy 06/06/2022   Syncope 03/23/2022   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/2021   Schizophrenia (HCC) 07/17/2021   Depression 07/17/2021   Insulin-requiring or dependent type II diabetes mellitus (HCC) 07/17/2021    PCP: Georganna Skeans, MD   REFERRING PROVIDER: Arman Bogus, MD   REFERRING DIAG: M54.5: Low back pain  Rationale for Evaluation and Treatment: Rehabilitation  THERAPY DIAG:  No  diagnosis found.  ONSET DATE: For approx 2 monhs  SUBJECTIVE:                                                                                                                                                                                           SUBJECTIVE STATEMENT: The pain is better.  Rt leg started hurting more about 2 mos ago, at that time her leg was going numb.  The numbness is there sometimes.      Pt reports approx 2 months ago she started developing R low back with intermittent R LE pain and N/T, 2 days/week. Pt associates her increase in pain with being pregnant. Pt is currently 5 months pregnant. Pt notes the R LE pain and N/T occurs primarily when lying down and she has difficulty  with ascending the 1st step for a flight of stairs, for which she receives physical assistance. Pt has a Hx of Right Lumbar three-four Minimally Invasive Surgery Laminectomy/Discectomy completed on 06/06/22. Following surgery and prior to pregnancy, she notes her low back and R LE pain only occurred infrequently and resolved within 24 hrs with taking ibuprofen.  PERTINENT HISTORY:  Right Lumbar three-four Minimally Invasive Surgery Laminectomy/Discectomy completed on 06/06/23. See PMH.  PAIN:  Are you having pain? Yes: NPRS scale: 3/10. Pain range the week prior to starting PT: 0-6/10 Pain location: low back, intermittent R leg pain N/T (2x per week, esp with lying down. Pt lies in her sides) Pain- ant thigh, N/T-ant lower leg. Pain description: ache, squeeze  Aggravating factors: Lying down Relieving factors: Sitting, pregnancy pillow  PRECAUTIONS: None  RED FLAGS: None   WEIGHT BEARING RESTRICTIONS: No  FALLS:  Has patient fallen in last 6 months? No  LIVING ENVIRONMENT: Lives with: lives with their family Lives in: [employer] assistance with initial first step with asceneding  OCCUPATION: Not working  PLOF: Independent  PATIENT GOALS: Pain management  NEXT MD VISIT:  To see Dr. Yetta Barre if needed  OBJECTIVE:  Note: Objective measures were completed at Evaluation unless otherwise noted.  DIAGNOSTIC FINDINGS:  Lumbar Spine 04/27/2022  MPRESSION: 1. Right subarticular disc protrusion with inferior migration at L3-4, impinging upon the descending right L4 nerve root in the right lateral recess. This is similar but perhaps slightly decreased in size as compared to previous MRI from 03/16/2022. 2. Central to left foraminal disc protrusion at L2-3 with resultant mild left lateral recess stenosis. 3. Central disc protrusion at L5-S1 without significant stenosis or impingement.  PATIENT SURVEYS:  Modified Oswestry 3/50= 6%   COGNITION: Overall cognitive status: Within functional limits for tasks assessed     SENSATION: WFL  MUSCLE LENGTH: Hamstrings: Right NT deg; Left NT deg Maisie Fus test: Right NT deg; Left NT deg  POSTURE: rounded shoulders and forward head  PALPATION: Not TTP to the low back area  LUMBAR ROM:  NT AROM 04/04/23  Flexion WFL min pain   [facility] felt good   Right lateral flexion WFL   Left lateral flexion Pain on Rt   Right rotation WFL  Left rotation WFL    (Blank rows = not tested)  LOWER EXTREMITY ROM:    Grossly WNL's   Active  Right eval Left eval  Hip flexion    Hip extension    Hip abduction    Hip adduction    Hip internal rotation    Hip external rotation    Knee flexion    Knee extension    Ankle dorsiflexion    Ankle plantarflexion    Ankle inversion    Ankle eversion     (Blank rows = not tested)  LOWER EXTREMITY MMT:   Myotome screen negative MMT Right eval Left eval  Hip flexion 4+ 4+  Hip extension    Hip abduction 4+ 4+  Hip adduction    Hip internal rotation    Hip external rotation 4+ 4+  Knee flexion 5 5  Knee extension    Ankle dorsiflexion 5 5  Ankle plantarflexion 5 5  Ankle inversion    Ankle eversion     (Blank rows = not tested)  LUMBAR SPECIAL TESTS:   Straight leg raise test: Negative, Slump test: Negative, and SI Compression/distraction test: Negative  FUNCTIONAL TESTS:  NT  GAIT: Distance walked: 200' Assistive device utilized: None  Level of assistance: Complete Independence Comments: WNLs  TREATMENT DATE:    [facility] Adult PT Treatment:                                                DATE: 04/04/23 Therapeutic Activity: Lumbar stabilization exercises Supine post pelvic tilt LTR  PPT with marching  Bridge x 10  SLR difficulty on Rt side, much weaker  Knee and hip extension green band x 10  Quadruped cat camel  Childs pose Hip hinge multiple ways and cues  Standing shoulder work for posture:  Horizontal abduction green band x 10  Narrow grip overhead lift green band x 10   Row and Extension green band x 15  Self Care: Core and posture, neutral spine   [facility] Adult PT Treatment:                                                DATE: 03/28/23 Therapeutic Exercise: Developed, instructed in, and pt completed therex as noted in HEP                                                                                                                         PATIENT EDUCATION:  Education details: Eval findings, POC, HEP, self care  Person educated: Patient Education method: Explanation, Demonstration, Tactile cues, Verbal cues, and Handouts Education comprehension: verbalized understanding, returned demonstration, verbal cues required, and tactile cues required  HOME EXERCISE PROGRAM: Access Code: [email or web address] URL: [email or web address] Date: 03/28/2023 Prepared by: Joellyn Rued Access Code: [email or web address] URL: [email or web address] Date: 04/04/2023 Prepared by: Karie Mainland  Exercises - Supine Pelvic Rocking  - 2 x daily - 7 x weekly - 1 sets - 10 reps - Supine Posterior Pelvic Tilt with Pelvic Floor Contraction  - 2 x daily - 7 x weekly - 1 sets - 10 reps - 3 hold - Supine Bridge  - 2 x daily - 7 x weekly - 1  sets - 10 reps - 3 hold - Cat Cow  - 2 x daily - 7 x weekly - 3 sets - 10 reps - 5-10 hold - Child's Pose Stretch  - 2 x daily - 7 x weekly - 3 sets - 10 reps - 5-10 hold - Standing Shoulder Horizontal Abduction with Resistance  - 1 x daily - 7 x weekly - 2 sets - 10 reps - 5 hold - Shoulder extension with resistance - Neutral  - 1 x daily - 7 x weekly - 2 sets - 10 reps - 5 hold  ASSESSMENT:  CLINICAL IMPRESSION: Patient was able to tolerate mat level exercises and she enjoys the stretching. Notable Rt LE weakness with SLR.  Initiated quadruped core and education on neutral  spine, posture training for lifting and home tasks. Added in some upper back work to her HEP to improve posture. She will continue to benefit from skilled PT to improve her ability to tolerate activity.    OBJECTIVE IMPAIRMENTS: decreased activity tolerance, difficulty walking, decreased strength, obesity, pain, and pregnancy .   ACTIVITY LIMITATIONS: carrying, lifting, bending, standing, and sleeping  PARTICIPATION LIMITATIONS: meal prep, cleaning, laundry, and community activity  PERSONAL FACTORS: Fitness, Past/current experiences, and 1 comorbidity: High BMI  are also affecting patient's functional outcome.   REHAB POTENTIAL: Good  CLINICAL DECISION MAKING: Evolving/moderate complexity  EVALUATION COMPLEXITY: Moderate   GOALS:   SHORT TERM GOALS = LTGs  LONG TERM GOALS: Target date: 05/18/23  Pt will be Ind in a final HEP to assist in the management of her low  back pain during pregnancy  Baseline: Initiated Goal status: INITIAL  2.  Pt will report overall improvement in low back pain of 50% or greater for improved ability to function and QOL Baseline:1-8/10  Goal status: INITIAL  3.  Pt will voice understanding of measures to assist in the management of her low back pain Baseline:  Goal status: INITIAL  PLAN:  PT FREQUENCY: 2x/week  PT DURATION: 6 weeks  PLANNED INTERVENTIONS: 97164- PT  Re-evaluation, 97110-Therapeutic exercises, 97530- Therapeutic activity, 97535- Self Care, 82956- Manual therapy, Patient/Family education, Taping, Cryotherapy, and Moist heat.  PLAN FOR NEXT SESSION: Assess response to HEP; progress therex as indicated; use of manual therapy as indicated.  Karie Mainland, PT 04/04/23 3:55 PM Phone: [phone] Fax: [phone]

## 2023-04-06 ENCOUNTER — Inpatient Hospital Stay (HOSPITAL_COMMUNITY)
Admission: AD | Admit: 2023-04-06 | Discharge: 2023-04-07 | Disposition: A | Discharge status: Home / Self Care | Payer: MEDICAID | Attending: Family Medicine | Admitting: Family Medicine

## 2023-04-06 ENCOUNTER — Encounter (HOSPITAL_COMMUNITY): Payer: Self-pay | Admitting: Family Medicine

## 2023-04-06 DIAGNOSIS — R000 Tachycardia, unspecified: Secondary | ICD-10-CM | POA: Insufficient documentation

## 2023-04-06 DIAGNOSIS — Z794 Long term (current) use of insulin: Secondary | ICD-10-CM | POA: Insufficient documentation

## 2023-04-06 DIAGNOSIS — O926 Galactorrhea: Secondary | ICD-10-CM | POA: Diagnosis not present

## 2023-04-06 DIAGNOSIS — O26899 Other specified pregnancy related conditions, unspecified trimester: Secondary | ICD-10-CM

## 2023-04-06 DIAGNOSIS — O99212 Obesity complicating pregnancy, second trimester: Secondary | ICD-10-CM | POA: Diagnosis not present

## 2023-04-06 DIAGNOSIS — E871 Hypo-osmolality and hyponatremia: Secondary | ICD-10-CM | POA: Diagnosis not present

## 2023-04-06 DIAGNOSIS — R110 Nausea: Secondary | ICD-10-CM | POA: Insufficient documentation

## 2023-04-06 DIAGNOSIS — O99512 Diseases of the respiratory system complicating pregnancy, second trimester: Secondary | ICD-10-CM | POA: Diagnosis present

## 2023-04-06 DIAGNOSIS — O24112 Pre-existing diabetes mellitus, type 2, in pregnancy, second trimester: Secondary | ICD-10-CM | POA: Insufficient documentation

## 2023-04-06 DIAGNOSIS — O26892 Other specified pregnancy related conditions, second trimester: Secondary | ICD-10-CM | POA: Diagnosis present

## 2023-04-06 DIAGNOSIS — R1030 Lower abdominal pain, unspecified: Secondary | ICD-10-CM | POA: Diagnosis present

## 2023-04-06 DIAGNOSIS — O0990 Supervision of high risk pregnancy, unspecified, unspecified trimester: Secondary | ICD-10-CM

## 2023-04-06 DIAGNOSIS — R519 Headache, unspecified: Secondary | ICD-10-CM | POA: Diagnosis not present

## 2023-04-06 DIAGNOSIS — R059 Cough, unspecified: Secondary | ICD-10-CM | POA: Insufficient documentation

## 2023-04-06 DIAGNOSIS — O280 Abnormal hematological finding on antenatal screening of mother: Secondary | ICD-10-CM

## 2023-04-06 DIAGNOSIS — Z3A20 20 weeks gestation of pregnancy: Secondary | ICD-10-CM | POA: Diagnosis not present

## 2023-04-06 DIAGNOSIS — O99891 Other specified diseases and conditions complicating pregnancy: Secondary | ICD-10-CM | POA: Insufficient documentation

## 2023-04-06 DIAGNOSIS — M545 Low back pain, unspecified: Secondary | ICD-10-CM | POA: Insufficient documentation

## 2023-04-06 LAB — CBC WITH DIFFERENTIAL/PLATELET
Abs Immature Granulocytes: 0.06 10*3/uL (ref 0.00–0.07)
Basophils Absolute: 0 10*3/uL (ref 0.0–0.1)
Basophils Relative: 0 %
Eosinophils Absolute: 0 10*3/uL (ref 0.0–0.5)
Eosinophils Relative: 0 %
HCT: 36 % (ref 36.0–46.0)
Hemoglobin: 12.2 g/dL (ref 12.0–15.0)
Immature Granulocytes: 1 %
Lymphocytes Relative: 8 %
Lymphs Abs: 0.9 10*3/uL (ref 0.7–4.0)
MCH: 29.5 pg (ref 26.0–34.0)
MCHC: 33.9 g/dL (ref 30.0–36.0)
MCV: 87.2 fL (ref 80.0–100.0)
Monocytes Absolute: 0.5 10*3/uL (ref 0.1–1.0)
Monocytes Relative: 4 %
Neutro Abs: 10.2 10*3/uL — ABNORMAL HIGH (ref 1.7–7.7)
Neutrophils Relative %: 87 %
Platelets: 240 10*3/uL (ref 150–400)
RBC: 4.13 MIL/uL (ref 3.87–5.11)
RDW: 13.2 % (ref 11.5–15.5)
WBC: 11.7 10*3/uL — ABNORMAL HIGH (ref 4.0–10.5)
nRBC: 0 % (ref 0.0–0.2)

## 2023-04-06 LAB — URINALYSIS, ROUTINE W REFLEX MICROSCOPIC
Bilirubin Urine: NEGATIVE
Glucose, UA: NEGATIVE mg/dL
Hgb urine dipstick: NEGATIVE
Ketones, ur: NEGATIVE mg/dL
Leukocytes,Ua: NEGATIVE
Nitrite: NEGATIVE
Protein, ur: NEGATIVE mg/dL
Specific Gravity, Urine: >1.03 — ABNORMAL HIGH (ref 1.005–1.030)
pH: 6 (ref 5.0–8.0)

## 2023-04-06 LAB — WET PREP, GENITAL
Sperm: NONE SEEN
Trich, Wet Prep: NONE SEEN
WBC, Wet Prep HPF POC: >=10 — AB (ref <10)
Yeast Wet Prep HPF POC: NONE SEEN

## 2023-04-06 LAB — COMPREHENSIVE METABOLIC PANEL
ALT: 19 U/L (ref 0–44)
AST: 25 U/L (ref 15–41)
Albumin: 3.1 g/dL — ABNORMAL LOW (ref 3.5–5.0)
Alkaline Phosphatase: 59 U/L (ref 38–126)
Anion gap: 10 (ref 5–15)
BUN: 5 mg/dL — ABNORMAL LOW (ref 6–20)
CO2: 20 mmol/L — ABNORMAL LOW (ref 22–32)
Calcium: 9 mg/dL (ref 8.9–10.3)
Chloride: 101 mmol/L (ref 98–111)
Creatinine, Ser: 0.63 mg/dL (ref 0.44–1.00)
GFR, Estimated: >60 mL/min (ref >60)
Glucose, Bld: 126 mg/dL — ABNORMAL HIGH (ref 70–99)
Potassium: 3.5 mmol/L (ref 3.5–5.1)
Sodium: 131 mmol/L — ABNORMAL LOW (ref 135–145)
Total Bilirubin: 0.6 mg/dL (ref 0.0–1.2)
Total Protein: 6.9 g/dL (ref 6.5–8.1)

## 2023-04-06 LAB — GLUCOSE, CAPILLARY: Glucose-Capillary: 118 mg/dL — ABNORMAL HIGH (ref 70–99)

## 2023-04-06 LAB — RESP PANEL BY RT-PCR (RSV, FLU A&B, COVID)  RVPGX2
Influenza A by PCR: NEGATIVE
Influenza B by PCR: NEGATIVE
Resp Syncytial Virus by PCR: NEGATIVE
SARS Coronavirus 2 by RT PCR: NEGATIVE

## 2023-04-06 LAB — GROUP A STREP BY PCR: Group A Strep by PCR: NOT DETECTED

## 2023-04-06 LAB — OSMOLALITY: Osmolality: 275 mosm/kg (ref 275–295)

## 2023-04-06 LAB — BETA-HYDROXYBUTYRIC ACID: Beta-Hydroxybutyric Acid: 0.4 mmol/L — ABNORMAL HIGH (ref 0.05–0.27)

## 2023-04-06 MED ORDER — ONDANSETRON 4 MG PO TBDP
8.0000 mg | ORAL_TABLET | Freq: Once | ORAL | Status: AC
  Administered 2023-04-06: 8 mg via ORAL
  Filled 2023-04-06: qty 2

## 2023-04-06 MED ORDER — SODIUM CHLORIDE 0.9 % IV BOLUS
1000.0000 mL | Freq: Once | INTRAVENOUS | Status: AC
  Administered 2023-04-06: 1000 mL via INTRAVENOUS

## 2023-04-06 MED ORDER — ACETAMINOPHEN 500 MG PO TABS
1000.0000 mg | ORAL_TABLET | Freq: Once | ORAL | Status: AC
  Administered 2023-04-06: 1000 mg via ORAL
  Filled 2023-04-06: qty 2

## 2023-04-06 MED ORDER — METOCLOPRAMIDE HCL 5 MG/ML IJ SOLN
10.0000 mg | Freq: Once | INTRAMUSCULAR | Status: AC
Start: 2023-04-06 — End: 2023-04-06
  Administered 2023-04-06: 10 mg via INTRAVENOUS
  Filled 2023-04-06: qty 2

## 2023-04-06 NOTE — MAU Provider Note (Incomplete Revision)
Lower abdominal cramps, now hips and LBP, cough, nausea    S [name] is a 29 y.o. G1P0000 pregnant female at 102w5d who presents to MAU today with complaint of lower abdominal cramps and new associated symptoms. Pt states the cramping started last night however didn't call clinic despite the cramping moving to hips and lower back.  She took tylenol x 2 at 1400 today.  Pain now still 10/10.  She also developed a cough and felt subjectively warm but did not check temperature.  Has some nausea.  Declines congestion, vomiting, diarrhea, change in urination, or sick contacts. Pt noted to be insulin dependent T2DM and states she has been able to be compliant with long acting sugars despite illness (Lantus 35U at bedtime).   Did have coitus prior to cramping and symptoms starting.    Receives care at [facility]. Prenatal records reviewed.  Pertinent items noted in HPI and remainder of comprehensive ROS otherwise negative.   O BP 112/80 (BP Location: Right Arm)   Pulse (!) 122   Temp 99.1 F (37.3 C) (Oral)   Resp 16   Ht 5\' 4"  (1.626 m)   Wt 108.1 kg   LMP 11/12/2022 (Exact Date)   SpO2 100%   BMI 40.90 kg/m  Physical Exam Vitals and nursing note reviewed.  Constitutional:      General: She is not in acute distress.    Appearance: She is obese. She is ill-appearing and diaphoretic.  HENT:     Head: Normocephalic and atraumatic.     Mouth/Throat:     Mouth: Mucous membranes are moist.     Pharynx: Oropharynx is clear.  Eyes:     Extraocular Movements: Extraocular movements intact.  Cardiovascular:     Rate and Rhythm: Tachycardia present.  Pulmonary:     Effort: Pulmonary effort is normal. No respiratory distress.  Abdominal:     General: There is no distension.     Tenderness: There is no abdominal tenderness. There is no right CVA tenderness or left CVA tenderness.  Skin:    General: Skin is warm.     Coloration: Skin is not cyanotic.     Findings: No erythema or  rash.  Neurological:     Mental Status: She is alert and oriented to person, place, and time.     Motor: No weakness.  Psychiatric:        Mood and Affect: Mood normal.        Behavior: Behavior normal.      MDM: MAU Course:  POC CBG 118 so less likely DKA but will f/u CMP for anion gap and CBCdiff to r/o infection. Swabs for viral illness and GAS. UA noninfectious. Given sweating and nausea will obtain EKG and orthostatics.   RVP negative quad screen  GAS throat negative   CMP Na 131*, Glu 126, Cr 0.63, AG 10, LFTs nrl CBCdiff WBC 11.7 with left shift, Hgb 12.2, Plts 240 BHB 0.40*  EKG = NSR with vent. Rate of 97, unchanged from prior   Orthostatics positive, pulse rate increase   Pt stating now that she has HA down from 10 to 7 with tylenol. Pt also states that she is lactating out of her R breast in s/o recent nipple stim with coitus last night and this morning. Given orthostatic most likely hypotonic hypovolemic hyponatremia so will give NS 1L bolus, ordered Usodium and Serum Osmo to confirm. Will also give Reglan IV to see if helps with HA.  If HA no improved, Serum Osmo >100 could consider SIADH given outside of mild orthostatic she appears euvolemic.  If SIADH is consider could also consider getting Head CT given galactorrhea, HA and Hyponatremia.  To r/o infection we will also obtain vaginal swabs given UA noninfectious and less likely respiratory given no respiratory symptoms outside of cough and negative Quad RVP (unless unidentified respiratory virus).  No change in bowel habits and only nauseated so less likely to be gastritis.    Signed out to [facility] pending vaginal swabs, HA improvement, and fluids.  Reassessment after more date collected for further management.   AP #[redacted] weeks gestation  Signed out to [facility], MD 04/06/2023 10:05 PM   Orthostatic VS for the past 24 hrs (Last 3 readings):  BP- Lying Pulse- Lying BP- Sitting Pulse- Sitting BP-  Standing at 0 minutes Pulse- Standing at 0 minutes BP- Standing at 3 minutes Pulse- Standing at 3 minutes  04/06/23 2125 129/76 98 133/81 105 135/79 114 130/78 117

## 2023-04-06 NOTE — MAU Note (Signed)
 Pt says yesterday at night - started lower abd cramps 10/10. [facility]- clinic- did not call . This am- cramping moved to hips and lower back Then today at 2 pm- took 2 reg Tyl - 2 tabs  Now  pain 10/10.  Also has a cough , at 4 pm-was warm inside - did not check Temp. No congestion. Some nausea. No vomiting. No diarrhea . No diff urinating Noone sick at home

## 2023-04-06 NOTE — MAU Provider Note (Addendum)
 Lower abdominal cramps, now hips and LBP, cough, nausea    S [name] is a 28 y.o. G1P0000 pregnant female at [redacted]w[redacted]d who presents to MAU today with complaint of lower abdominal cramps and new associated symptoms. Pt states the cramping started last night however didn't call clinic despite the cramping moving to hips and lower back.  She took tylenol x 2 at 1400 today.  Pain now still 10/10.  She also developed a cough and felt subjectively warm but did not check temperature.  Has some nausea.  Declines congestion, vomiting, diarrhea, change in urination, or sick contacts. Pt noted to be insulin dependent T2DM and states she has been able to be compliant with long acting sugars despite illness (Lantus 35U at bedtime).   Did have coitus prior to cramping and symptoms starting.    Receives care at [facility]. Prenatal records reviewed.  Pertinent items noted in HPI and remainder of comprehensive ROS otherwise negative.   O BP 112/80 (BP Location: Right Arm)   Pulse (!) 122   Temp 99.1 F (37.3 C) (Oral)   Resp 16   Ht 5\' 4"  (1.626 m)   Wt 108.1 kg   LMP 11/12/2022 (Exact Date)   SpO2 100%   BMI 40.90 kg/m  Physical Exam Vitals and nursing note reviewed.  Constitutional:      General: She is not in acute distress.    Appearance: She is obese. She is ill-appearing and diaphoretic.  HENT:     Head: Normocephalic and atraumatic.     Mouth/Throat:     Mouth: Mucous membranes are moist.     Pharynx: Oropharynx is clear.  Eyes:     Extraocular Movements: Extraocular movements intact.  Cardiovascular:     Rate and Rhythm: Tachycardia present.  Pulmonary:     Effort: Pulmonary effort is normal. No respiratory distress.  Abdominal:     General: There is no distension.     Tenderness: There is no abdominal tenderness. There is no right CVA tenderness or left CVA tenderness.  Skin:    General: Skin is warm.     Coloration: Skin is not cyanotic.     Findings: No erythema or  rash.  Neurological:     Mental Status: She is alert and oriented to person, place, and time.     Motor: No weakness.  Psychiatric:        Mood and Affect: Mood normal.        Behavior: Behavior normal.      MDM: MAU Course:  POC CBG 118 so less likely DKA but will f/u CMP for anion gap and CBCdiff to r/o infection. Swabs for viral illness and GAS. UA noninfectious. Given sweating and nausea will obtain EKG and orthostatics.   RVP negative quad screen  GAS throat negative   CMP Na 131*, Glu 126, Cr 0.63, AG 10, LFTs nrl CBCdiff WBC 11.7 with left shift, Hgb 12.2, Plts 240 BHB 0.40*  EKG = NSR with vent. Rate of 97, unchanged from prior   Orthostatics positive, pulse rate increase   Pt stating now that she has HA down from 10 to 7 with tylenol. Pt also states that she is lactating out of her R breast in s/o recent nipple stim with coitus last night and this morning. Given orthostatic most likely hypotonic hypovolemic hyponatremia so will give NS 1L bolus, ordered Usodium and Serum Osmo to confirm. Will also give Reglan IV to see if helps with HA.  If HA no improved, Serum Osmo >100 could consider SIADH given outside of mild orthostatic she appears euvolemic.  If SIADH is consider could also consider getting Head CT given galactorrhea, HA and Hyponatremia.  To r/o infection we will also obtain vaginal swabs given UA noninfectious and less likely respiratory given no respiratory symptoms outside of cough and negative Quad RVP (unless unidentified respiratory virus).  No change in bowel habits and only nauseated so less likely to be gastritis.    Signed out to [facility] pending vaginal swabs, HA improvement, and fluids.  Reassessment after more date collected for further management.   AP #[redacted] weeks gestation  Signed out to [facility], MD 04/06/2023 10:05 PM   Reassessment (11:38 PM)  Orthostatic VS for the past 24 hrs (Last 3 readings):  BP- Lying Pulse- Lying BP-  Sitting Pulse- Sitting BP- Standing at 0 minutes Pulse- Standing at 0 minutes BP- Standing at 3 minutes Pulse- Standing at 3 minutes  04/06/23 2125 129/76 98 133/81 105 135/79 114 130/78 117   -Dr. Gildardo Griffes on unit. Consulted and informed of patient status, evaluation, interventions, and results. Advised: *Repeat orthostatics, if normal without significant elevation in HR d/c home.    Reassessment (12:06 AM)  Orthostatic VS for the past 24 hrs (Last 3 readings):  BP- Lying Pulse- Lying BP- Sitting Pulse- Sitting BP- Standing at 0 minutes Pulse- Standing at 0 minutes BP- Standing at 3 minutes Pulse- Standing at 3 minutes  04/06/23 2353 -- -- -- -- -- -- 119/74 104  04/06/23 2350 -- -- -- -- 121/69 99 -- --  04/06/23 2347 117/71 90 120/77 93 -- -- -- --  04/06/23 2125 129/76 98 133/81 105 135/79 114 130/78 117   -Rx for Metronidazole sent to pharmacy on file.  -Nurse to give instructions and precautions.  Cherre Robins MSN, CNM Advanced Practice Provider, [facility] [employer]

## 2023-04-07 DIAGNOSIS — Z3A20 20 weeks gestation of pregnancy: Secondary | ICD-10-CM

## 2023-04-07 DIAGNOSIS — O26892 Other specified pregnancy related conditions, second trimester: Secondary | ICD-10-CM

## 2023-04-07 DIAGNOSIS — R519 Headache, unspecified: Secondary | ICD-10-CM

## 2023-04-07 MED ORDER — METRONIDAZOLE 500 MG PO TABS: 500.0000 mg | ORAL_TABLET | Freq: Two times a day (BID) | ORAL | 0 refills | Status: DC

## 2023-04-07 NOTE — MAU Provider Note (Incomplete)
 Lower abdominal cramps, now hips and LBP, cough, nausea    S Melissa Gilmore is a 28 y.o. G1P0000 pregnant female at [redacted]w[redacted]d who presents to MAU today with complaint of lower abdominal cramps and new associated symptoms. Pt states the cramping started last night however didn't call clinic despite the cramping moving to hips and lower back.  She took tylenol x 2 at 1400 today.  Pain now still 10/10.  She also developed a cough and felt subjectively warm but did not check temperature.  Has some nausea.  Declines congestion, vomiting, diarrhea, change in urination, or sick contacts. Pt noted to be insulin dependent T2DM and states she has been able to be compliant with long acting sugars despite illness (Lantus 35U at bedtime).   Did have coitus prior to cramping and symptoms starting.    Receives care at [facility]. Prenatal records reviewed.  Pertinent items noted in HPI and remainder of comprehensive ROS otherwise negative.   O BP 112/80 (BP Location: Right Arm)   Pulse (!) 122   Temp 99.1 F (37.3 C) (Oral)   Resp 16   Ht 5\' 4"  (1.626 m)   Wt 108.1 kg   LMP 11/12/2022 (Exact Date)   SpO2 100%   BMI 40.90 kg/m  Physical Exam Vitals and nursing note reviewed.  Constitutional:      General: She is not in acute distress.    Appearance: She is obese. She is ill-appearing and diaphoretic.  HENT:     Head: Normocephalic and atraumatic.     Mouth/Throat:     Mouth: Mucous membranes are moist.     Pharynx: Oropharynx is clear.  Eyes:     Extraocular Movements: Extraocular movements intact.  Cardiovascular:     Rate and Rhythm: Tachycardia present.  Pulmonary:     Effort: Pulmonary effort is normal. No respiratory distress.  Abdominal:     General: There is no distension.     Tenderness: There is no abdominal tenderness. There is no right CVA tenderness or left CVA tenderness.  Skin:    General: Skin is warm.     Coloration: Skin is not cyanotic.     Findings: No erythema or  rash.  Neurological:     Mental Status: She is alert and oriented to person, place, and time.     Motor: No weakness.  Psychiatric:        Mood and Affect: Mood normal.        Behavior: Behavior normal.      MDM: MAU Course:  POC CBG 118 so less likely DKA but will f/u CMP for anion gap and CBCdiff to r/o infection. Swabs for viral illness and GAS. UA noninfectious. Given sweating and nausea will obtain EKG and orthostatics.   RVP negative quad screen  GAS throat negative   CMP Na 131*, Glu 126, Cr 0.63, AG 10, LFTs nrl CBCdiff WBC 11.7 with left shift, Hgb 12.2, Plts 240 BHB 0.40*  EKG = NSR with vent. Rate of 97, unchanged from prior   Orthostatics positive, pulse rate increase   Pt stating now that she has HA down from 10 to 7 with tylenol. Pt also states that she is lactating out of her R breast in s/o recent nipple stim with coitus last night and this morning. Given orthostatic most likely hypotonic hypovolemic hyponatremia so will give NS 1L bolus, ordered Usodium and Serum Osmo to confirm. Will also give Reglan IV to see if helps with HA.  If HA no improved, Serum Osmo >100 could consider SIADH given outside of mild orthostatic she appears euvolemic.  If SIADH is consider could also consider getting Head CT given galactorrhea, HA and Hyponatremia.  To r/o infection we will also obtain vaginal swabs given UA noninfectious and less likely respiratory given no respiratory symptoms outside of cough and negative Quad RVP (unless unidentified respiratory virus).  No change in bowel habits and only nauseated so less likely to be gastritis.    Signed out to [facility] pending vaginal swabs, HA improvement, and fluids.  Reassessment after more date collected for further management.   AP #[redacted] weeks gestation  Signed out to [facility], MD 04/06/2023 10:05 PM   Reassessment (11:3 PM)  Orthostatic VS for the past 24 hrs (Last 3 readings):  BP- Lying Pulse- Lying BP-  Sitting Pulse- Sitting BP- Standing at 0 minutes Pulse- Standing at 0 minutes BP- Standing at 3 minutes Pulse- Standing at 3 minutes  04/06/23 2125 129/76 98 133/81 105 135/79 114 130/78 117

## 2023-04-09 ENCOUNTER — Ambulatory Visit: Payer: MEDICAID | Admitting: Obstetrics and Gynecology

## 2023-04-09 ENCOUNTER — Other Ambulatory Visit: Payer: Self-pay

## 2023-04-09 VITALS — BP 113/79 | HR 98 | Wt 237.6 lb

## 2023-04-09 DIAGNOSIS — Z3A21 21 weeks gestation of pregnancy: Secondary | ICD-10-CM | POA: Diagnosis not present

## 2023-04-09 DIAGNOSIS — O24319 Unspecified pre-existing diabetes mellitus in pregnancy, unspecified trimester: Secondary | ICD-10-CM

## 2023-04-09 DIAGNOSIS — O0990 Supervision of high risk pregnancy, unspecified, unspecified trimester: Secondary | ICD-10-CM

## 2023-04-09 DIAGNOSIS — O0992 Supervision of high risk pregnancy, unspecified, second trimester: Secondary | ICD-10-CM | POA: Diagnosis not present

## 2023-04-09 DIAGNOSIS — O280 Abnormal hematological finding on antenatal screening of mother: Secondary | ICD-10-CM

## 2023-04-09 DIAGNOSIS — Z6841 Body Mass Index (BMI) 40.0 and over, adult: Secondary | ICD-10-CM

## 2023-04-09 DIAGNOSIS — O24312 Unspecified pre-existing diabetes mellitus in pregnancy, second trimester: Secondary | ICD-10-CM | POA: Diagnosis not present

## 2023-04-09 LAB — GC/CHLAMYDIA PROBE AMP (~~LOC~~) NOT AT ARMC
Chlamydia: NEGATIVE
Comment: NEGATIVE
Comment: NORMAL
Neisseria Gonorrhea: NEGATIVE

## 2023-04-09 NOTE — Patient Instructions (Signed)
For colds and allergies  Any anti-histamine including benadryl, allegra, claritin, etc.  Sudafed but not phenylephrine  Mucinex  Robitussin  For Reflux/heartburn  Pepcid Zantac Tums Prilosec Prevacid  For yeast infections  Monistat  For constipation  Colace  For minor aches and pains  Tylenol-do not take more than 4000mg in 24 hours. Therma-care or like heat packs  

## 2023-04-09 NOTE — Progress Notes (Signed)
   PRENATAL VISIT NOTE  Subjective:  Melissa Gilmore is a 28 y.o. G1P0000 at [redacted]w[redacted]d being seen today for ongoing prenatal care.  She is currently monitored for the following issues for this high-risk pregnancy and has 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HCC); Schizophrenia (HCC); Depression; Insulin-requiring or dependent type II diabetes mellitus (HCC); Syncope; Lumbar radiculopathy; Supervision of high risk pregnancy, antepartum; Family history of spina bifida; Abnormal antenatal AFP screen; Preexisting diabetes complicating pregnancy, antepartum; and BMI 40.0-44.9, adult (HCC) on their problem list.  Patient doing well with no acute concerns today. She reports  occasional cough/cold symptoms .  Contractions: Not present. Vag. Bleeding: None.   . Denies leaking of fluid.   The following portions of the patient's history were reviewed and updated as appropriate: allergies, current medications, past family history, past medical history, past social history, past surgical history and problem list. Problem list updated.  Objective:   Vitals:   04/09/23 1335  BP: 113/79  Pulse: 98  Weight: 237 lb 9.6 oz (107.8 kg)    Fetal Status: Fetal Heart Rate (bpm): 143         General:  Alert, oriented and cooperative. Patient is in no acute distress.  Skin: Skin is warm and dry. No rash noted.   Cardiovascular: Normal heart rate noted  Respiratory: Normal respiratory effort, no problems with respiration noted No rales. Rhonchi or wheezes  Abdomen: Soft, gravid, appropriate for gestational age.  Pain/Pressure: Present     Pelvic: Cervical exam deferred        Extremities: Normal range of motion.  Edema: None  Mental Status:  Normal mood and affect. Normal behavior. Normal judgment and thought content.   Assessment and Plan:  Pregnancy: G1P0000 at [redacted]w[redacted]d  1. [redacted] weeks gestation of pregnancy (Primary)   2. Preexisting diabetes complicating pregnancy, antepartum Pt brought in 5 blood sugars,  most were postprandials in the 140s Pt does not use regular meal time insulin, only lantus 35 units q HS No changes made today, several glucose sheets given to record Per pt , fetal echo on 04/24/23 along with growth scan  3. Abnormal antenatal AFP screen   4. Supervision of high risk pregnancy, antepartum Continue routine prenatal care  5. BMI 40.0-44.9, adult (HCC)   Preterm labor symptoms and general obstetric precautions including but not limited to vaginal bleeding, contractions, leaking of fluid and fetal movement were reviewed in detail with the patient.  Please refer to After Visit Summary for other counseling recommendations.   Return in about 3 weeks (around 04/30/2023) for [facility], in person.   Mariel Aloe, MD Faculty Attending [facility]

## 2023-04-10 ENCOUNTER — Encounter: Payer: Self-pay | Admitting: Physical Therapy

## 2023-04-10 ENCOUNTER — Ambulatory Visit: Payer: MEDICAID | Attending: Neurological Surgery | Admitting: Physical Therapy

## 2023-04-10 DIAGNOSIS — R262 Difficulty in walking, not elsewhere classified: Secondary | ICD-10-CM | POA: Diagnosis present

## 2023-04-10 DIAGNOSIS — M5441 Lumbago with sciatica, right side: Secondary | ICD-10-CM | POA: Insufficient documentation

## 2023-04-10 DIAGNOSIS — G8929 Other chronic pain: Secondary | ICD-10-CM | POA: Insufficient documentation

## 2023-04-10 DIAGNOSIS — M6281 Muscle weakness (generalized): Secondary | ICD-10-CM | POA: Diagnosis present

## 2023-04-10 NOTE — Therapy (Signed)
 OUTPATIENT PHYSICAL THERAPY THORACOLUMBAR NOTE   Patient Name: Melissa Gilmore MRN: [id number] DOB:28-Jul-1995, 28 y.o., female Today's Date: 04/10/2023  END OF SESSION:  PT End of Session - 04/10/23 1400     Visit Number 3    Number of Visits 13    Date for PT Re-Evaluation 05/18/23    Authorization Type [employer]    PT Start Time 0200    PT Stop Time 0240    PT Time Calculation (min) 40 min              Past Medical History:  Diagnosis Date   Acute cholecystitis with chronic cholecystitis 07/16/2021   Acute lumbar back pain 03/18/2022   Anxiety    Childhood asthma    Depression    Major Depression disorder   Diabetes mellitus without complication [facility])    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HCC)    Dyslipidemia 07/17/2021   Headache    Migraines   History of abnormal uterine bleeding 07/17/2021   Schizophrenia (HCC)    Urticaria    Past Surgical History:  Procedure Laterality Date   CHOLECYSTECTOMY N/A 07/17/2021   Procedure: SINGLE SITE LAPAROSCOPIC CHOLECYSTECTOMY WITH INTRAOPERATIVE CHOLANGIOGRAM; LIVER BIOPSY;  Surgeon: Karie Soda, MD;  Location: [facility] ORS;  Service: General;  Laterality: N/A;   IR EPIDUROGRAPHY  03/21/2022   LUMBAR LAMINECTOMY/ DECOMPRESSION WITH MET-RX Right 06/06/2022   Procedure: Right Lumbar three-four Minimally Invasive Surgery Laminectomy/Discectomy;  Surgeon: Jadene Pierini, MD;  Location: [facility] OR;  Service: Neurosurgery;  Laterality: Right;   WISDOM TOOTH EXTRACTION     Patient Active Problem List   Diagnosis Date Noted   BMI 40.0-44.9, adult (HCC) 04/09/2023   Abnormal antenatal AFP screen 03/25/2023   Preexisting diabetes complicating pregnancy, antepartum 03/25/2023   Family history of spina bifida 02/27/2023   Supervision of high risk pregnancy, antepartum 02/22/2023   Lumbar radiculopathy 06/06/2022   Syncope 03/23/2022   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/2021   Schizophrenia (HCC)  07/17/2021   Depression 07/17/2021   Insulin-requiring or dependent type II diabetes mellitus (HCC) 07/17/2021    PCP: Georganna Skeans, MD   REFERRING PROVIDER: Arman Bogus, MD   REFERRING DIAG: M54.5: Low back pain  Rationale for Evaluation and Treatment: Rehabilitation  THERAPY DIAG:  Chronic right-sided low back pain with right-sided sciatica  Difficulty in walking, not elsewhere classified  Acute midline low back pain with right-sided sciatica  ONSET DATE: For approx 2 monhs  SUBJECTIVE:  SUBJECTIVE STATEMENT: The back has been better. Did not do as many of the exercises because I was sick over the weekend. Right now 3/10 low back. No leg pain or numbness.      EVAL: Pt reports approx 2 months ago she started developing R low back with intermittent R LE pain and N/T, 2 days/week. Pt associates her increase in pain with being pregnant. Pt is currently 5 months pregnant. Pt notes the R LE pain and N/T occurs primarily when lying down and she has difficulty with ascending the 1st step for a flight of stairs, for which she receives physical assistance. Pt has a Hx of Right Lumbar three-four Minimally Invasive Surgery Laminectomy/Discectomy completed on 06/06/22. Following surgery and prior to pregnancy, she notes her low back and R LE pain only occurred infrequently and resolved within 24 hrs with taking ibuprofen.  PERTINENT HISTORY:  Right Lumbar three-four Minimally Invasive Surgery Laminectomy/Discectomy completed on 06/06/23. See PMH.  PAIN:  Are you having pain? Yes: NPRS scale: 3/10. Pain range the week prior to starting PT: 0-6/10 Pain location: low back, intermittent R leg pain N/T (2x per week, esp with lying down. Pt lies in her sides) Pain- ant thigh, N/T-ant lower leg. Pain  description: ache, squeeze  Aggravating factors: Lying down Relieving factors: Sitting, pregnancy pillow  PRECAUTIONS: None  RED FLAGS: None   WEIGHT BEARING RESTRICTIONS: No  FALLS:  Has patient fallen in last 6 months? No  LIVING ENVIRONMENT: Lives with: lives with their family Lives in: [employer] assistance with initial first step with asceneding  OCCUPATION: Not working  PLOF: Independent  PATIENT GOALS: Pain management  NEXT MD VISIT: To see Dr. Yetta Barre if needed  OBJECTIVE:  Note: Objective measures were completed at Evaluation unless otherwise noted.  DIAGNOSTIC FINDINGS:  Lumbar Spine 04/27/2022  MPRESSION: 1. Right subarticular disc protrusion with inferior migration at L3-4, impinging upon the descending right L4 nerve root in the right lateral recess. This is similar but perhaps slightly decreased in size as compared to previous MRI from 03/16/2022. 2. Central to left foraminal disc protrusion at L2-3 with resultant mild left lateral recess stenosis. 3. Central disc protrusion at L5-S1 without significant stenosis or impingement.  PATIENT SURVEYS:  Modified Oswestry 3/50= 6%   COGNITION: Overall cognitive status: Within functional limits for tasks assessed     SENSATION: WFL  MUSCLE LENGTH: Hamstrings: Right NT deg; Left NT deg Maisie Fus test: Right NT deg; Left NT deg  POSTURE: rounded shoulders and forward head  PALPATION: Not TTP to the low back area  LUMBAR ROM:  NT AROM 04/04/23  Flexion WFL min pain   Extension [facility] felt good   Right lateral flexion WFL   Left lateral flexion Pain on Rt   Right rotation WFL  Left rotation WFL    (Blank rows = not tested)  LOWER EXTREMITY ROM:    Grossly WNL's   Active  Right eval Left eval  Hip flexion    Hip extension    Hip abduction    Hip adduction    Hip internal rotation    Hip external rotation    Knee flexion    Knee extension    Ankle dorsiflexion    Ankle  plantarflexion    Ankle inversion    Ankle eversion     (Blank rows = not tested)  LOWER EXTREMITY MMT:   Myotome screen negative MMT Right eval Left eval  Hip flexion 4+ 4+  Hip extension  Hip abduction 4+ 4+  Hip adduction    Hip internal rotation    Hip external rotation 4+ 4+  Knee flexion 5 5  Knee extension    Ankle dorsiflexion 5 5  Ankle plantarflexion 5 5  Ankle inversion    Ankle eversion     (Blank rows = not tested)  LUMBAR SPECIAL TESTS:  Straight leg raise test: Negative, Slump test: Negative, and SI Compression/distraction test: Negative  FUNCTIONAL TESTS:  NT  GAIT: Distance walked: 200' Assistive device utilized: None Level of assistance: Complete Independence Comments: WNLs  TREATMENT DATE:  [facility] Adult PT Treatment:                                                DATE: 04/10/23 Therapeutic Activity: Lumbar stabilization exercises Supine post pelvic tilt x 10  LTR  PPT with marching  Bridge x 10  SLR left x 10, right heel slide to R march x 10 Quadruped cat camel  Childs pose Marjo Bicker pose with thread the needle  Qped Alt LE -weakness on right STS x 10  STS with RLE back  Heel raises  Standing march  Standing hip ext Standing shoulder work for posture:  Horizontal abduction green band x 10  Row and Extension green band x 15   [facility] Adult PT Treatment:                                                DATE: 04/04/23 Therapeutic Activity: Lumbar stabilization exercises Supine post pelvic tilt LTR  PPT with marching  Bridge x 10  SLR difficulty on Rt side, much weaker  Knee and hip extension green band x 10  Quadruped cat camel  Childs pose Hip hinge multiple ways and cues  Standing shoulder work for posture:           Horizontal abduction green band x 10           Narrow grip overhead lift green band x 10            Row and Extension green band x 15  Self Care: Core and posture, neutral spine    [facility] Adult PT Treatment:                                                 DATE: 03/28/23 Therapeutic Exercise: Developed, instructed in, and pt completed therex as noted in HEP  PATIENT EDUCATION:  Education details: Eval findings, POC, HEP, self care  Person educated: Patient Education method: Explanation, Demonstration, Tactile cues, Verbal cues, and Handouts Education comprehension: verbalized understanding, returned demonstration, verbal cues required, and tactile cues required  HOME EXERCISE PROGRAM: Access Code: [email or web address] URL: [email or web address] Date: 03/28/2023 Prepared by: Joellyn Rued Access Code: [email or web address] URL: [email or web address] Date: 04/04/2023 Prepared by: Karie Mainland  Exercises - Supine Pelvic Rocking  - 2 x daily - 7 x weekly - 1 sets - 10 reps - Supine Posterior Pelvic Tilt with Pelvic Floor Contraction  - 2 x daily - 7 x weekly - 1 sets - 10 reps - 3 hold - Supine Bridge  - 2 x daily - 7 x weekly - 1 sets - 10 reps - 3 hold - Cat Cow  - 2 x daily - 7 x weekly - 3 sets - 10 reps - 5-10 hold - Child's Pose Stretch  - 2 x daily - 7 x weekly - 3 sets - 10 reps - 5-10 hold - Standing Shoulder Horizontal Abduction with Resistance  - 1 x daily - 7 x weekly - 2 sets - 10 reps - 5 hold - Shoulder extension with resistance - Neutral  - 1 x daily - 7 x weekly - 2 sets - 10 reps - 5 hold  ASSESSMENT:  CLINICAL IMPRESSION: Improved leg and back pain, but still has intermittent leg pain and numbness in lower leg. Continues to demo weakness in RLE with hip therex. Did experience right leg pain with standing hip extension. Otherwise she tolerated session well. Most weakness noted with right hip flexion. Continues to respond well to lumbar stretches. Will continue POC.    Patient was able to tolerate mat level exercises and she enjoys the stretching. Notable Rt LE weakness  with SLR.  Initiated quadruped core and education on neutral spine, posture training for lifting and home tasks. Added in some upper back work to her HEP to improve posture. She will continue to benefit from skilled PT to improve her ability to tolerate activity.    OBJECTIVE IMPAIRMENTS: decreased activity tolerance, difficulty walking, decreased strength, obesity, pain, and pregnancy .   ACTIVITY LIMITATIONS: carrying, lifting, bending, standing, and sleeping  PARTICIPATION LIMITATIONS: meal prep, cleaning, laundry, and community activity  PERSONAL FACTORS: Fitness, Past/current experiences, and 1 comorbidity: High BMI  are also affecting patient's functional outcome.   REHAB POTENTIAL: Good  CLINICAL DECISION MAKING: Evolving/moderate complexity  EVALUATION COMPLEXITY: Moderate   GOALS:   SHORT TERM GOALS = LTGs  LONG TERM GOALS: Target date: 05/18/23  Pt will be Ind in a final HEP to assist in the management of her low  back pain during pregnancy  Baseline: Initiated Goal status: INITIAL  2.  Pt will report overall improvement in low back pain of 50% or greater for improved ability to function and QOL Baseline:1-8/10  Goal status: INITIAL  3.  Pt will voice understanding of measures to assist in the management of her low back pain Baseline:  Goal status: INITIAL  PLAN:  PT FREQUENCY: 2x/week  PT DURATION: 6 weeks  PLANNED INTERVENTIONS: 97164- PT Re-evaluation, 97110-Therapeutic exercises, 97530- Therapeutic activity, 97535- Self Care, 54098- Manual therapy, Patient/Family education, Taping, Cryotherapy, and Moist heat.  PLAN FOR NEXT SESSION: Assess response to HEP; progress therex as indicated; use of manual therapy as indicated.  Louellen Haldeman, PTA 04/10/23 3:55 PM Phone: [phone] Fax: [phone]

## 2023-04-11 ENCOUNTER — Ambulatory Visit: Payer: MEDICAID | Admitting: Internal Medicine

## 2023-04-11 NOTE — Therapy (Signed)
 OUTPATIENT PHYSICAL THERAPY THORACOLUMBAR NOTE   Patient Name: Melissa Gilmore MRN: [id number] DOB:29-Feb-1995, 28 y.o., female Today's Date: 04/12/2023  END OF SESSION:  PT End of Session - 04/12/23 1530     Visit Number 4    Number of Visits 13    Date for PT Re-Evaluation 05/18/23    Authorization Type [employer]    PT Start Time 1505    PT Stop Time 1545    PT Time Calculation (min) 40 min    Activity Tolerance Patient tolerated treatment well    Behavior During Therapy [facility] for tasks assessed/performed               Past Medical History:  Diagnosis Date   Acute cholecystitis with chronic cholecystitis 07/16/2021   Acute lumbar back pain 03/18/2022   Anxiety    Childhood asthma    Depression    Major Depression disorder   Diabetes mellitus without complication (HCC)    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HCC)    Dyslipidemia 07/17/2021   Headache    Migraines   History of abnormal uterine bleeding 07/17/2021   Schizophrenia (HCC)    Urticaria    Past Surgical History:  Procedure Laterality Date   CHOLECYSTECTOMY N/A 07/17/2021   Procedure: SINGLE SITE LAPAROSCOPIC CHOLECYSTECTOMY WITH INTRAOPERATIVE CHOLANGIOGRAM; LIVER BIOPSY;  Surgeon: Karie Soda, MD;  Location: [facility];  Service: General;  Laterality: N/A;   IR EPIDUROGRAPHY  03/21/2022   LUMBAR LAMINECTOMY/ DECOMPRESSION WITH MET-RX Right 06/06/2022   Procedure: Right Lumbar three-four Minimally Invasive Surgery Laminectomy/Discectomy;  Surgeon: Jadene Pierini, MD;  Location: [facility] OR;  Service: Neurosurgery;  Laterality: Right;   WISDOM TOOTH EXTRACTION     Patient Active Problem List   Diagnosis Date Noted   BMI 40.0-44.9, adult (HCC) 04/09/2023   Abnormal antenatal AFP screen 03/25/2023   Preexisting diabetes complicating pregnancy, antepartum 03/25/2023   Family history of spina bifida 02/27/2023   Supervision of high risk pregnancy, antepartum 02/22/2023   Lumbar  radiculopathy 06/06/2022   Syncope 03/23/2022   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/2021   Schizophrenia (HCC) 07/17/2021   Depression 07/17/2021   Insulin-requiring or dependent type II diabetes mellitus (HCC) 07/17/2021    PCP: Georganna Skeans, MD   REFERRING PROVIDER: Arman Bogus, MD   REFERRING DIAG: M54.5: Low back pain  Rationale for Evaluation and Treatment: Rehabilitation  THERAPY DIAG:  Chronic right-sided low back pain with right-sided sciatica  Muscle weakness (generalized)  ONSET DATE: For approx 2 monhs  SUBJECTIVE:  SUBJECTIVE STATEMENT: Pt reports overall her low back is feeling better.   EVAL: Pt reports approx 2 months ago she started developing R low back with intermittent R LE pain and N/T, 2 days/week. Pt associates her increase in pain with being pregnant. Pt is currently 5 months pregnant. Pt notes the R LE pain and N/T occurs primarily when lying down and she has difficulty with ascending the 1st step for a flight of stairs, for which she receives physical assistance. Pt has a Hx of Right Lumbar three-four Minimally Invasive Surgery Laminectomy/Discectomy completed on 06/06/22. Following surgery and prior to pregnancy, she notes her low back and R LE pain only occurred infrequently and resolved within 24 hrs with taking ibuprofen.  PERTINENT HISTORY:  Right Lumbar three-four Minimally Invasive Surgery Laminectomy/Discectomy completed on 06/06/23. See PMH.  PAIN:  Are you having pain? Yes: NPRS scale: 4/10. Pain range the week prior to starting PT: 0-6/10 Pain location: low back, intermittent R leg pain N/T (2x per week, esp with lying down. Pt lies in her sides) Pain- ant thigh, N/T-ant lower leg. Pain description: ache, squeeze  Aggravating factors: Lying  down Relieving factors: Sitting, pregnancy pillow  PRECAUTIONS: None  RED FLAGS: None   WEIGHT BEARING RESTRICTIONS: No  FALLS:  Has patient fallen in last 6 months? No  LIVING ENVIRONMENT: Lives with: lives with their family Lives in: [employer] assistance with initial first step with asceneding  OCCUPATION: Not working  PLOF: Independent  PATIENT GOALS: Pain management  NEXT MD VISIT: To see Dr. Yetta Barre if needed  OBJECTIVE:  Note: Objective measures were completed at Evaluation unless otherwise noted.  DIAGNOSTIC FINDINGS:  Lumbar Spine 04/27/2022  MPRESSION: 1. Right subarticular disc protrusion with inferior migration at L3-4, impinging upon the descending right L4 nerve root in the right lateral recess. This is similar but perhaps slightly decreased in size as compared to previous MRI from 03/16/2022. 2. Central to left foraminal disc protrusion at L2-3 with resultant mild left lateral recess stenosis. 3. Central disc protrusion at L5-S1 without significant stenosis or impingement.  PATIENT SURVEYS:  Modified Oswestry 3/50= 6%   COGNITION: Overall cognitive status: Within functional limits for tasks assessed     SENSATION: WFL  MUSCLE LENGTH: Hamstrings: Right NT deg; Left NT deg Maisie Fus test: Right NT deg; Left NT deg  POSTURE: rounded shoulders and forward head  PALPATION: Not TTP to the low back area  LUMBAR ROM:  NT AROM 04/04/23  Flexion WFL min pain   Extension [facility] felt good   Right lateral flexion WFL   Left lateral flexion Pain on Rt   Right rotation WFL  Left rotation WFL    (Blank rows = not tested)  LOWER EXTREMITY ROM:    Grossly WNL's   Active  Right eval Left eval  Hip flexion    Hip extension    Hip abduction    Hip adduction    Hip internal rotation    Hip external rotation    Knee flexion    Knee extension    Ankle dorsiflexion    Ankle plantarflexion    Ankle inversion    Ankle eversion      (Blank rows = not tested)  LOWER EXTREMITY MMT:   Myotome screen negative MMT Right eval Left eval  Hip flexion 4+ 4+  Hip extension    Hip abduction 4+ 4+  Hip adduction    Hip internal rotation    Hip external rotation 4+ 4+  Knee  flexion 5 5  Knee extension    Ankle dorsiflexion 5 5  Ankle plantarflexion 5 5  Ankle inversion    Ankle eversion     (Blank rows = not tested)  LUMBAR SPECIAL TESTS:  Straight leg raise test: Negative, Slump test: Negative, and SI Compression/distraction test: Negative  FUNCTIONAL TESTS:  NT  GAIT: Distance walked: 200' Assistive device utilized: None Level of assistance: Complete Independence Comments: WNLs  TREATMENT DATE:  [facility] Adult PT Treatment:                                                DATE: 04/12/23 Therapeutic Activity: Lumbar stabilization exercises Ant/post pelvic rocks x10 Supine post pelvic tilt x 10  LTR  PPT with marching 2x10 Bridge x 10  SLR left x 10, right heel slide to R march x 10 Quadruped cat camel  Childs pose Marjo Bicker pose with thread the needle  Qped Alt LE -weakness on right STS x 10  STS with RLE back x10 Heel raises x20 Standing march  Standing hip ext x15 Standing shoulder work for posture:  Horizontal abduction green band 2 x 10  Row and Extension green band 2 x 15   [facility] Adult PT Treatment:                                                DATE: 04/10/23 Therapeutic Activity: Lumbar stabilization exercises Supine post pelvic tilt x 10  LTR  PPT with marching  Bridge x 10  SLR left x 10, right heel slide to R march x 10 Quadruped cat camel  Childs pose Marjo Bicker pose with thread the needle  Qped Alt LE -weakness on right STS x 10  STS with RLE back  Heel raises  Standing march  Standing hip ext Standing shoulder work for posture:           Horizontal abduction green band x 10           Row and Extension green band x 15                                                                                                                          PATIENT EDUCATION:  Education details: Eval findings, POC, HEP, self care  Person educated: Patient Education method: Explanation, Demonstration, Tactile cues, Verbal cues, and Handouts Education comprehension: verbalized understanding, returned demonstration, verbal cues required, and tactile cues required  HOME EXERCISE PROGRAM: Access Code: [email or web address] URL: [email or web address] Date: 04/12/2023 Prepared by: Joellyn Rued  Exercises - Supine Pelvic Rocking  - 2 x daily - 7 x weekly - 1 sets - 10 reps - Supine Posterior Pelvic Tilt with Pelvic Floor Contraction  - 2  x daily - 7 x weekly - 1 sets - 10 reps - 3 hold - Supine Bridge  - 2 x daily - 7 x weekly - 1 sets - 10 reps - 3 hold - Cat Cow  - 2 x daily - 7 x weekly - 3 sets - 10 reps - 5-10 hold - Child's Pose Stretch  - 2 x daily - 7 x weekly - 3 sets - 10 reps - 5-10 hold - Standing Shoulder Horizontal Abduction with Resistance  - 1 x daily - 7 x weekly - 2 sets - 10 reps - 5 hold - Shoulder extension with resistance - Neutral  - 1 x daily - 7 x weekly - 2 sets - 10 reps - 5 hold  ASSESSMENT:  CLINICAL IMPRESSION: Pt reports overall improvement in her low back and R LE pain. In fact, pt states her she has not experienced r leg pain recently. PT was continued for lumbopelvic flexibility and core strengthening. R leg weakness observed c qped hip ext and sit to standing. Pt tolerated PT today without adverse effects. At end of session, pt reported her low back pain had decreased. Pt will continue to benefit from skilled PT to address impairments for improved  back function c minimized pain.  OBJECTIVE IMPAIRMENTS: decreased activity tolerance, difficulty walking, decreased strength, obesity, pain, and pregnancy .   ACTIVITY LIMITATIONS: carrying, lifting, bending, standing, and sleeping  PARTICIPATION LIMITATIONS: meal prep, cleaning, laundry, and community  activity  PERSONAL FACTORS: Fitness, Past/current experiences, and 1 comorbidity: High BMI  are also affecting patient's functional outcome.   REHAB POTENTIAL: Good  CLINICAL DECISION MAKING: Evolving/moderate complexity  EVALUATION COMPLEXITY: Moderate   GOALS:   SHORT TERM GOALS = LTGs  LONG TERM GOALS: Target date: 05/18/23  Pt will be Ind in a final HEP to assist in the management of her low  back pain during pregnancy  Baseline: Initiated Goal status: INITIAL  2.  Pt will report overall improvement in low back pain of 50% or greater for improved ability to function and QOL Baseline:1-8/10  Goal status: INITIAL  3.  Pt will voice understanding of measures to assist in the management of her low back pain Baseline:  Goal status: INITIAL  PLAN:  PT FREQUENCY: 2x/week  PT DURATION: 6 weeks  PLANNED INTERVENTIONS: 97164- PT Re-evaluation, 97110-Therapeutic exercises, 97530- Therapeutic activity, 97535- Self Care, 06301- Manual therapy, Patient/Family education, Taping, Cryotherapy, and Moist heat.  PLAN FOR NEXT SESSION: Assess response to HEP; progress therex as indicated; use of manual therapy as indicated.  Katheryn Culliton MS, PT 04/12/23 3:48 PM

## 2023-04-12 ENCOUNTER — Ambulatory Visit: Payer: MEDICAID

## 2023-04-12 DIAGNOSIS — G8929 Other chronic pain: Secondary | ICD-10-CM

## 2023-04-12 DIAGNOSIS — M5441 Lumbago with sciatica, right side: Secondary | ICD-10-CM | POA: Diagnosis not present

## 2023-04-12 DIAGNOSIS — M6281 Muscle weakness (generalized): Secondary | ICD-10-CM

## 2023-04-16 ENCOUNTER — Ambulatory Visit: Payer: MEDICAID | Admitting: Family Medicine

## 2023-04-17 ENCOUNTER — Encounter: Payer: Self-pay | Admitting: Physical Therapy

## 2023-04-17 ENCOUNTER — Ambulatory Visit: Payer: MEDICAID | Admitting: Physical Therapy

## 2023-04-17 DIAGNOSIS — M6281 Muscle weakness (generalized): Secondary | ICD-10-CM

## 2023-04-17 DIAGNOSIS — M5441 Lumbago with sciatica, right side: Secondary | ICD-10-CM | POA: Diagnosis not present

## 2023-04-17 DIAGNOSIS — G8929 Other chronic pain: Secondary | ICD-10-CM

## 2023-04-17 NOTE — Therapy (Signed)
 OUTPATIENT PHYSICAL THERAPY THORACOLUMBAR NOTE   Patient Name: Melissa Gilmore MRN: [id number] DOB:November 18, 1995, 28 y.o., female Today's Date: 04/17/2023  END OF SESSION:  PT End of Session - 04/17/23 1356     Visit Number 5    Number of Visits 13    Date for PT Re-Evaluation 05/18/23    Authorization Type [employer]    Authorization Time Period 03/28/23-05/12/23    Authorization - Number of Visits 12    PT Start Time 1355    PT Stop Time 1435    PT Time Calculation (min) 40 min               Past Medical History:  Diagnosis Date   Acute cholecystitis with chronic cholecystitis 07/16/2021   Acute lumbar back pain 03/18/2022   Anxiety    Childhood asthma    Depression    Major Depression disorder   Diabetes mellitus without complication (HCC)    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HCC)    Dyslipidemia 07/17/2021   Headache    Migraines   History of abnormal uterine bleeding 07/17/2021   Schizophrenia (HCC)    Urticaria    Past Surgical History:  Procedure Laterality Date   CHOLECYSTECTOMY N/A 07/17/2021   Procedure: SINGLE SITE LAPAROSCOPIC CHOLECYSTECTOMY WITH INTRAOPERATIVE CHOLANGIOGRAM; LIVER BIOPSY;  Surgeon: Karie Soda, MD;  Location: [facility];  Service: General;  Laterality: N/A;   IR EPIDUROGRAPHY  03/21/2022   LUMBAR LAMINECTOMY/ DECOMPRESSION WITH MET-RX Right 06/06/2022   Procedure: Right Lumbar three-four Minimally Invasive Surgery Laminectomy/Discectomy;  Surgeon: Jadene Pierini, MD;  Location: [facility] OR;  Service: Neurosurgery;  Laterality: Right;   WISDOM TOOTH EXTRACTION     Patient Active Problem List   Diagnosis Date Noted   BMI 40.0-44.9, adult (HCC) 04/09/2023   Abnormal antenatal AFP screen 03/25/2023   Preexisting diabetes complicating pregnancy, antepartum 03/25/2023   Family history of spina bifida 02/27/2023   Supervision of high risk pregnancy, antepartum 02/22/2023   Lumbar radiculopathy 06/06/2022   Syncope  03/23/2022   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/2021   Schizophrenia (HCC) 07/17/2021   Depression 07/17/2021   Insulin-requiring or dependent type II diabetes mellitus (HCC) 07/17/2021    PCP: Georganna Skeans, MD   REFERRING PROVIDER: Arman Bogus, MD   REFERRING DIAG: M54.5: Low back pain  Rationale for Evaluation and Treatment: Rehabilitation  THERAPY DIAG:  Chronic right-sided low back pain with right-sided sciatica  Muscle weakness (generalized)  ONSET DATE: For approx 2 monhs  SUBJECTIVE:  SUBJECTIVE STATEMENT: Pt reports increased back and leg pain over the weekend with a lot of walking.  RLE has been intermittent now.    EVAL: Pt reports approx 2 months ago she started developing R low back with intermittent R LE pain and N/T, 2 days/week. Pt associates her increase in pain with being pregnant. Pt is currently 5 months pregnant. Pt notes the R LE pain and N/T occurs primarily when lying down and she has difficulty with ascending the 1st step for a flight of stairs, for which she receives physical assistance. Pt has a Hx of Right Lumbar three-four Minimally Invasive Surgery Laminectomy/Discectomy completed on 06/06/22. Following surgery and prior to pregnancy, she notes her low back and R LE pain only occurred infrequently and resolved within 24 hrs with taking ibuprofen.  PERTINENT HISTORY:  Right Lumbar three-four Minimally Invasive Surgery Laminectomy/Discectomy completed on 06/06/23. See PMH.  PAIN:  Are you having pain? Yes: NPRS scale: 3/10 Pain location: low back, intermittent R leg pain N/T (2x per week, esp with lying down. Pt lies in her sides) Pain- ant thigh, N/T-ant lower leg. Pain description: ache, squeeze  Aggravating factors: Lying down Relieving factors:  Sitting, pregnancy pillow  PRECAUTIONS: None  RED FLAGS: None   WEIGHT BEARING RESTRICTIONS: No  FALLS:  Has patient fallen in last 6 months? No  LIVING ENVIRONMENT: Lives with: lives with their family Lives in: [employer] assistance with initial first step with asceneding  OCCUPATION: Not working  PLOF: Independent  PATIENT GOALS: Pain management  NEXT MD VISIT: To see Dr. Yetta Barre if needed  OBJECTIVE:  Note: Objective measures were completed at Evaluation unless otherwise noted.  DIAGNOSTIC FINDINGS:  Lumbar Spine 04/27/2022  MPRESSION: 1. Right subarticular disc protrusion with inferior migration at L3-4, impinging upon the descending right L4 nerve root in the right lateral recess. This is similar but perhaps slightly decreased in size as compared to previous MRI from 03/16/2022. 2. Central to left foraminal disc protrusion at L2-3 with resultant mild left lateral recess stenosis. 3. Central disc protrusion at L5-S1 without significant stenosis or impingement.  PATIENT SURVEYS:  Modified Oswestry 3/50= 6%   COGNITION: Overall cognitive status: Within functional limits for tasks assessed     SENSATION: WFL  MUSCLE LENGTH: Hamstrings: Right NT deg; Left NT deg Maisie Fus test: Right NT deg; Left NT deg  POSTURE: rounded shoulders and forward head  PALPATION: Not TTP to the low back area  LUMBAR ROM:  NT AROM 04/04/23  Flexion WFL min pain   Extension [facility] felt good   Right lateral flexion WFL   Left lateral flexion Pain on Rt   Right rotation WFL  Left rotation WFL    (Blank rows = not tested)  LOWER EXTREMITY ROM:    Grossly WNL's   Active  Right eval Left eval  Hip flexion    Hip extension    Hip abduction    Hip adduction    Hip internal rotation    Hip external rotation    Knee flexion    Knee extension    Ankle dorsiflexion    Ankle plantarflexion    Ankle inversion    Ankle eversion     (Blank rows = not  tested)  LOWER EXTREMITY MMT:   Myotome screen negative MMT Right eval Left eval  Hip flexion 4+ 4+  Hip extension    Hip abduction 4+ 4+  Hip adduction    Hip internal rotation    Hip external rotation  4+ 4+  Knee flexion 5 5  Knee extension    Ankle dorsiflexion 5 5  Ankle plantarflexion 5 5  Ankle inversion    Ankle eversion     (Blank rows = not tested)  LUMBAR SPECIAL TESTS:  Straight leg raise test: Negative, Slump test: Negative, and SI Compression/distraction test: Negative  FUNCTIONAL TESTS:  NT  GAIT: Distance walked: 200' Assistive device utilized: None Level of assistance: Complete Independence Comments: WNLs  TREATMENT DATE:  [facility] Adult PT Treatment:                                                DATE: 04/17/23 Therapeutic Activity: Lumbar stabilization exercises Ant/post pelvic rocks x10 Supine post pelvic tilt x 10  LTR  PPT with marching 2x10 Bridge x 10  Side hip abduction x 10 Side hip clam x 10  Quadruped cat camel  Childs pose Alternating fire hydrants in qped Alternating hip ext with knee flexed - from support qped  Marjo Bicker pose with thread the needle  Qped Alt LE -weakness on right STS x 10  STS with RLE back x10 Heel raises x20  Standing shoulder work for posture:  Horizontal abduction green band 2 x 10  Row and Extension green band 2 x 15     [facility] Adult PT Treatment:                                                DATE: 04/12/23 Therapeutic Activity: Lumbar stabilization exercises Supine post pelvic tilt x 10  LTR  PPT with marching 1 x 10 Bridge x 10  SLR left x 10, right heel slide to R march x 10 Quadruped cat camel  Childs pose Marjo Bicker pose with thread the needle  Qped Alt LE -weakness on right STS x 10  STS with RLE back x10 Heel raises x 20 Standing march  Standing hip ext x15  Standing shoulder work for posture:  Horizontal abduction green band 2 x 10  Row and Extension green band 2 x 15   [facility] Adult PT  Treatment:                                                DATE: 04/10/23 Therapeutic Activity: Lumbar stabilization exercises Supine post pelvic tilt x 10  LTR  PPT with marching  Bridge x 10  SLR left x 10, right heel slide to R march x 10 Quadruped cat camel  Childs pose Marjo Bicker pose with thread the needle  Qped Alt LE -weakness on right STS x 10  STS with RLE back  Heel raises  Standing march  Standing hip ext Standing shoulder work for posture:           Horizontal abduction green band x 10           Row and Extension green band x 15  PATIENT EDUCATION:  Education details: Eval findings, POC, HEP, self care  Person educated: Patient Education method: Explanation, Demonstration, Tactile cues, Verbal cues, and Handouts Education comprehension: verbalized understanding, returned demonstration, verbal cues required, and tactile cues required  HOME EXERCISE PROGRAM: Access Code: [email or web address] URL: [email or web address] Date: 04/12/2023 Prepared by: Joellyn Rued  Exercises - Supine Pelvic Rocking  - 2 x daily - 7 x weekly - 1 sets - 10 reps - Supine Posterior Pelvic Tilt with Pelvic Floor Contraction  - 2 x daily - 7 x weekly - 1 sets - 10 reps - 3 hold - Supine Bridge  - 2 x daily - 7 x weekly - 1 sets - 10 reps - 3 hold - Cat Cow  - 2 x daily - 7 x weekly - 3 sets - 10 reps - 5-10 hold - Child's Pose Stretch  - 2 x daily - 7 x weekly - 3 sets - 10 reps - 5-10 hold - Standing Shoulder Horizontal Abduction with Resistance  - 1 x daily - 7 x weekly - 2 sets - 10 reps - 5 hold - Shoulder extension with resistance - Neutral  - 1 x daily - 7 x weekly - 2 sets - 10 reps - 5 hold  ASSESSMENT:  CLINICAL IMPRESSION: Pt reports a little increased pain over the weekend with walking and now her leg pain is intermittent. Continued with lumbar mobility and core  strengthening with pt reporting improvement post session. Pt will continue to benefit from skilled PT to address impairments for improved  back function c minimized pain.  OBJECTIVE IMPAIRMENTS: decreased activity tolerance, difficulty walking, decreased strength, obesity, pain, and pregnancy .   ACTIVITY LIMITATIONS: carrying, lifting, bending, standing, and sleeping  PARTICIPATION LIMITATIONS: meal prep, cleaning, laundry, and community activity  PERSONAL FACTORS: Fitness, Past/current experiences, and 1 comorbidity: High BMI  are also affecting patient's functional outcome.   REHAB POTENTIAL: Good  CLINICAL DECISION MAKING: Evolving/moderate complexity  EVALUATION COMPLEXITY: Moderate   GOALS:   SHORT TERM GOALS = LTGs  LONG TERM GOALS: Target date: 05/18/23  Pt will be Ind in a final HEP to assist in the management of her low  back pain during pregnancy  Baseline: Initiated Goal status: INITIAL  2.  Pt will report overall improvement in low back pain of 50% or greater for improved ability to function and QOL Baseline:1-8/10  Goal status: INITIAL  3.  Pt will voice understanding of measures to assist in the management of her low back pain Baseline:  Goal status: INITIAL  PLAN:  PT FREQUENCY: 2x/week  PT DURATION: 6 weeks  PLANNED INTERVENTIONS: 97164- PT Re-evaluation, 97110-Therapeutic exercises, 97530- Therapeutic activity, 97535- Self Care, 16109- Manual therapy, Patient/Family education, Taping, Cryotherapy, and Moist heat.  PLAN FOR NEXT SESSION: Assess response to HEP; progress therex as indicated; use of manual therapy as indicated.  Ryeleigh Santore, PTA 04/17/23 2:42 PM Phone: [phone] Fax: [phone]

## 2023-04-19 ENCOUNTER — Ambulatory Visit: Payer: MEDICAID | Admitting: Physical Therapy

## 2023-04-19 ENCOUNTER — Encounter: Payer: Self-pay | Admitting: Physical Therapy

## 2023-04-19 DIAGNOSIS — M5441 Lumbago with sciatica, right side: Secondary | ICD-10-CM | POA: Diagnosis not present

## 2023-04-19 DIAGNOSIS — M6281 Muscle weakness (generalized): Secondary | ICD-10-CM

## 2023-04-19 DIAGNOSIS — R262 Difficulty in walking, not elsewhere classified: Secondary | ICD-10-CM

## 2023-04-19 DIAGNOSIS — G8929 Other chronic pain: Secondary | ICD-10-CM

## 2023-04-19 NOTE — Therapy (Addendum)
 OUTPATIENT PHYSICAL THERAPY THORACOLUMBAR NOTE   Patient Name: Melissa Gilmore MRN: [id number] DOB:[birth year], 28 y.o., female Today's Date: 04/19/2023  END OF SESSION:  PT End of Session - 04/19/23 1403     Visit Number 6    Number of Visits 13    Date for PT Re-Evaluation 05/18/23    Authorization Type [employer]    Authorization Time Period 03/28/23-05/12/23    Authorization - Number of Visits 12    PT Start Time 0205    PT Stop Time 0244    PT Time Calculation (min) 39 min               Past Medical History:  Diagnosis Date   Acute cholecystitis with chronic cholecystitis 07/16/2021   Acute lumbar back pain 03/18/2022   Anxiety    Childhood asthma    Depression    Major Depression disorder   Diabetes mellitus without complication [facility])    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HCC)    Dyslipidemia 07/17/2021   Headache    Migraines   History of abnormal uterine bleeding 07/17/2021   Schizophrenia (HCC)    Urticaria    Past Surgical History:  Procedure Laterality Date   CHOLECYSTECTOMY N/A 07/17/2021   Procedure: SINGLE SITE LAPAROSCOPIC CHOLECYSTECTOMY WITH INTRAOPERATIVE CHOLANGIOGRAM; LIVER BIOPSY;  Surgeon: Karie Soda, MD;  Location: [facility];  Service: General;  Laterality: N/A;   IR EPIDUROGRAPHY  03/21/2022   LUMBAR LAMINECTOMY/ DECOMPRESSION WITH MET-RX Right 06/06/2022   Procedure: Right Lumbar three-four Minimally Invasive Surgery Laminectomy/Discectomy;  Surgeon: Jadene Pierini, MD;  Location: [facility] OR;  Service: Neurosurgery;  Laterality: Right;   WISDOM TOOTH EXTRACTION     Patient Active Problem List   Diagnosis Date Noted   BMI 40.0-44.9, adult (HCC) 04/09/2023   Abnormal antenatal AFP screen 03/25/2023   Preexisting diabetes complicating pregnancy, antepartum 03/25/2023   Family history of spina bifida 02/27/2023   Supervision of high risk pregnancy, antepartum 02/22/2023   Lumbar radiculopathy 06/06/2022   Syncope  03/23/2022   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/2021   Schizophrenia (HCC) 07/17/2021   Depression 07/17/2021   Insulin-requiring or dependent type II diabetes mellitus (HCC) 07/17/2021    PCP: Georganna Skeans, MD   REFERRING PROVIDER: Arman Bogus, MD   REFERRING DIAG: M54.5: Low back pain  Rationale for Evaluation and Treatment: Rehabilitation  THERAPY DIAG:  Chronic right-sided low back pain with right-sided sciatica  Difficulty in walking, not elsewhere classified  Muscle weakness (generalized)  ONSET DATE: For approx 2 monhs  SUBJECTIVE:  SUBJECTIVE STATEMENT: Pt reports less back pain today and one episode of leg pain 2 nights ago while sitting that caused Great toe numbness.   EVAL: Pt reports approx 2 months ago she started developing R low back with intermittent R LE pain and N/T, 2 days/week. Pt associates her increase in pain with being pregnant. Pt is currently 5 months pregnant. Pt notes the R LE pain and N/T occurs primarily when lying down and she has difficulty with ascending the 1st step for a flight of stairs, for which she receives physical assistance. Pt has a Hx of Right Lumbar three-four Minimally Invasive Surgery Laminectomy/Discectomy completed on 06/06/22. Following surgery and prior to pregnancy, she notes her low back and R LE pain only occurred infrequently and resolved within 24 hrs with taking ibuprofen.  PERTINENT HISTORY:  Right Lumbar three-four Minimally Invasive Surgery Laminectomy/Discectomy completed on 06/06/23. See PMH.  PAIN:  Are you having pain? Yes: NPRS scale: 3/10 Pain location: low back, intermittent R leg pain N/T (2x per week, esp with lying down. Pt lies in her sides) Pain- ant thigh, N/T-ant lower leg. Pain description: ache, squeeze   Aggravating factors: Lying down Relieving factors: Sitting, pregnancy pillow  PRECAUTIONS: None  RED FLAGS: None   WEIGHT BEARING RESTRICTIONS: No  FALLS:  Has patient fallen in last 6 months? No  LIVING ENVIRONMENT: Lives with: lives with their family Lives in: [employer] assistance with initial first step with asceneding  OCCUPATION: Not working  PLOF: Independent  PATIENT GOALS: Pain management  NEXT MD VISIT: To see Dr. Yetta Barre if needed  OBJECTIVE:  Note: Objective measures were completed at Evaluation unless otherwise noted.  DIAGNOSTIC FINDINGS:  Lumbar Spine 04/27/2022  MPRESSION: 1. Right subarticular disc protrusion with inferior migration at L3-4, impinging upon the descending right L4 nerve root in the right lateral recess. This is similar but perhaps slightly decreased in size as compared to previous MRI from 03/16/2022. 2. Central to left foraminal disc protrusion at L2-3 with resultant mild left lateral recess stenosis. 3. Central disc protrusion at L5-S1 without significant stenosis or impingement.  PATIENT SURVEYS:  Modified Oswestry 3/50= 6%   COGNITION: Overall cognitive status: Within functional limits for tasks assessed     SENSATION: WFL  MUSCLE LENGTH: Hamstrings: Right NT deg; Left NT deg Maisie Fus test: Right NT deg; Left NT deg  POSTURE: rounded shoulders and forward head  PALPATION: Not TTP to the low back area  LUMBAR ROM:  NT AROM 04/04/23 04/19/23  Flexion WFL min pain  WFL Pain with return  Extension [facility] felt good  [facility] felt good   Right lateral flexion WFL    Left lateral flexion Pain on Rt    Right rotation WFL   Left rotation WFL     (Blank rows = not tested)  LOWER EXTREMITY ROM:    Grossly WNL's   Active  Right eval Left eval  Hip flexion    Hip extension    Hip abduction    Hip adduction    Hip internal rotation    Hip external rotation    Knee flexion    Knee extension    Ankle  dorsiflexion    Ankle plantarflexion    Ankle inversion    Ankle eversion     (Blank rows = not tested)  LOWER EXTREMITY MMT:   Myotome screen negative MMT Right eval Left eval  Hip flexion 4+ 4+  Hip extension    Hip abduction 4+ 4+  Hip  adduction    Hip internal rotation    Hip external rotation 4+ 4+  Knee flexion 5 5  Knee extension    Ankle dorsiflexion 5 5  Ankle plantarflexion 5 5  Ankle inversion    Ankle eversion     (Blank rows = not tested)  LUMBAR SPECIAL TESTS:  Straight leg raise test: Negative, Slump test: Negative, and SI Compression/distraction test: Negative  FUNCTIONAL TESTS:  NT  GAIT: Distance walked: 200' Assistive device utilized: None Level of assistance: Complete Independence Comments: WNLs  TREATMENT DATE:  [facility] Adult PT Treatment:                                                DATE: 04/19/23 Therapeutic Exercise: PPT  SKTC Bridge  LTR Childs pose  Cat camel Qped Alt UE Qped Alt LE  Side lying hip abduction right , left Side lying hip clam Right , left STS x 10 STS with RLE back x 10     [facility] Adult PT Treatment:                                                DATE: 04/17/23 Therapeutic Activity: Lumbar stabilization exercises Ant/post pelvic rocks x10 Supine post pelvic tilt x 10  LTR  PPT with marching 2x10 Bridge x 10  Side hip abduction x 10 Side hip clam x 10  Quadruped cat camel  Childs pose Alternating fire hydrants in qped Alternating hip ext with knee flexed - from support qped  Marjo Bicker pose with thread the needle  Qped Alt LE -weakness on right STS x 10  STS with RLE back x10 Heel raises x20  Standing shoulder work for posture:  Horizontal abduction green band 2 x 10  Row and Extension green band 2 x 15     [facility] Adult PT Treatment:                                                DATE: 04/12/23 Therapeutic Activity: Lumbar stabilization exercises Supine post pelvic tilt x 10  LTR  PPT with marching 1 x  10 Bridge x 10  SLR left x 10, right heel slide to R march x 10 Quadruped cat camel  Childs pose Marjo Bicker pose with thread the needle  Qped Alt LE -weakness on right STS x 10  STS with RLE back x10 Heel raises x 20 Standing march  Standing hip ext x15  Standing shoulder work for posture:  Horizontal abduction green band 2 x 10  Row and Extension green band 2 x 15  PATIENT EDUCATION:  Education details: Eval findings, POC, HEP, self care  Person educated: Patient Education method: Explanation, Demonstration, Tactile cues, Verbal cues, and Handouts Education comprehension: verbalized understanding, returned demonstration, verbal cues required, and tactile cues required  HOME EXERCISE PROGRAM: Access Code: [email or web address] URL: [email or web address] Date: 04/12/2023 Prepared by: Joellyn Rued  Exercises - Supine Pelvic Rocking  - 2 x daily - 7 x weekly - 1 sets - 10 reps - Supine Posterior Pelvic Tilt with Pelvic Floor Contraction  - 2 x daily - 7 x weekly - 1 sets - 10 reps - 3 hold - Supine Bridge  - 2 x daily - 7 x weekly - 1 sets - 10 reps - 3 hold - Cat Cow  - 2 x daily - 7 x weekly - 3 sets - 10 reps - 5-10 hold - Child's Pose Stretch  - 2 x daily - 7 x weekly - 3 sets - 10 reps - 5-10 hold - Standing Shoulder Horizontal Abduction with Resistance  - 1 x daily - 7 x weekly - 2 sets - 10 reps - 5 hold - Shoulder extension with resistance - Neutral  - 1 x daily - 7 x weekly - 2 sets - 10 reps - 5 hold  ASSESSMENT:  CLINICAL IMPRESSION: Pt reports decreased back pain today with intermittent bouts of right leg pain. She does demo improved fluidity of movements especially with Quadruped LE extensions. Continued with lumbar mobility and core strengthening with pt reporting improvement post session. Pt will continue to benefit from skilled PT to address  impairments for improved  back function c minimized pain.  OBJECTIVE IMPAIRMENTS: decreased activity tolerance, difficulty walking, decreased strength, obesity, pain, and pregnancy .   ACTIVITY LIMITATIONS: carrying, lifting, bending, standing, and sleeping  PARTICIPATION LIMITATIONS: meal prep, cleaning, laundry, and community activity  PERSONAL FACTORS: Fitness, Past/current experiences, and 1 comorbidity: High BMI  are also affecting patient's functional outcome.   REHAB POTENTIAL: Good  CLINICAL DECISION MAKING: Evolving/moderate complexity  EVALUATION COMPLEXITY: Moderate   GOALS:   SHORT TERM GOALS = LTGs  LONG TERM GOALS: Target date: 05/18/23  Pt will be Ind in a final HEP to assist in the management of her low  back pain during pregnancy  Baseline: Initiated Goal status: INITIAL  2.  Pt will report overall improvement in low back pain of 50% or greater for improved ability to function and QOL Baseline:1-8/10  Goal status: INITIAL  3.  Pt will voice understanding of measures to assist in the management of her low back pain Baseline:  Goal status: INITIAL  PLAN:  PT FREQUENCY: 2x/week  PT DURATION: 6 weeks  PLANNED INTERVENTIONS: 97164- PT Re-evaluation, 97110-Therapeutic exercises, 97530- Therapeutic activity, 97535- Self Care, 16109- Manual therapy, Patient/Family education, Taping, Cryotherapy, and Moist heat.  PLAN FOR NEXT SESSION: Assess response to HEP; progress therex as indicated; use of manual therapy as indicated.  Karolina Zamor, PTA 04/19/23 2:47 PM Phone: [phone] Fax: [phone]

## 2023-04-20 ENCOUNTER — Telehealth: Payer: Self-pay

## 2023-04-20 ENCOUNTER — Encounter: Payer: MEDICAID | Admitting: Family Medicine

## 2023-04-23 ENCOUNTER — Other Ambulatory Visit (HOSPITAL_COMMUNITY): Payer: Self-pay

## 2023-04-24 ENCOUNTER — Ambulatory Visit: Payer: MEDICAID

## 2023-04-24 ENCOUNTER — Ambulatory Visit: Payer: MEDICAID | Admitting: Physical Therapy

## 2023-04-24 ENCOUNTER — Encounter: Payer: Self-pay | Admitting: Physical Therapy

## 2023-04-24 ENCOUNTER — Other Ambulatory Visit: Payer: Self-pay | Admitting: Family Medicine

## 2023-04-24 ENCOUNTER — Ambulatory Visit: Payer: MEDICAID | Attending: Obstetrics and Gynecology

## 2023-04-24 ENCOUNTER — Other Ambulatory Visit: Payer: Self-pay | Admitting: *Deleted

## 2023-04-24 VITALS — BP 120/73 | HR 74

## 2023-04-24 DIAGNOSIS — O280 Abnormal hematological finding on antenatal screening of mother: Secondary | ICD-10-CM

## 2023-04-24 DIAGNOSIS — O352XX0 Maternal care for (suspected) hereditary disease in fetus, not applicable or unspecified: Secondary | ICD-10-CM

## 2023-04-24 DIAGNOSIS — E119 Type 2 diabetes mellitus without complications: Secondary | ICD-10-CM | POA: Diagnosis not present

## 2023-04-24 DIAGNOSIS — E669 Obesity, unspecified: Secondary | ICD-10-CM

## 2023-04-24 DIAGNOSIS — Z3A23 23 weeks gestation of pregnancy: Secondary | ICD-10-CM | POA: Diagnosis not present

## 2023-04-24 DIAGNOSIS — M5441 Lumbago with sciatica, right side: Secondary | ICD-10-CM | POA: Diagnosis not present

## 2023-04-24 DIAGNOSIS — O99212 Obesity complicating pregnancy, second trimester: Secondary | ICD-10-CM

## 2023-04-24 DIAGNOSIS — O24112 Pre-existing diabetes mellitus, type 2, in pregnancy, second trimester: Secondary | ICD-10-CM | POA: Diagnosis not present

## 2023-04-24 DIAGNOSIS — O24312 Unspecified pre-existing diabetes mellitus in pregnancy, second trimester: Secondary | ICD-10-CM

## 2023-04-24 DIAGNOSIS — Z8279 Family history of other congenital malformations, deformations and chromosomal abnormalities: Secondary | ICD-10-CM | POA: Diagnosis not present

## 2023-04-24 DIAGNOSIS — G8929 Other chronic pain: Secondary | ICD-10-CM

## 2023-04-24 DIAGNOSIS — Z361 Encounter for antenatal screening for raised alphafetoprotein level: Secondary | ICD-10-CM | POA: Insufficient documentation

## 2023-04-24 DIAGNOSIS — O0990 Supervision of high risk pregnancy, unspecified, unspecified trimester: Secondary | ICD-10-CM

## 2023-04-24 DIAGNOSIS — Z362 Encounter for other antenatal screening follow-up: Secondary | ICD-10-CM | POA: Diagnosis not present

## 2023-04-24 DIAGNOSIS — R772 Abnormality of alphafetoprotein: Secondary | ICD-10-CM

## 2023-04-24 DIAGNOSIS — R262 Difficulty in walking, not elsewhere classified: Secondary | ICD-10-CM

## 2023-04-24 NOTE — Therapy (Signed)
 OUTPATIENT PHYSICAL THERAPY THORACOLUMBAR NOTE   Patient Name: Melissa Gilmore MRN: [id number] DOB:12/13/1995, 28 y.o., female Today's Date: 04/24/2023  END OF SESSION:  PT End of Session - 04/24/23 1403     Visit Number 7    Number of Visits 13    Date for PT Re-Evaluation 05/18/23    Authorization Type [employer]    Authorization Time Period 03/28/23-05/12/23    Authorization - Visit Number 7    Authorization - Number of Visits 12    PT Start Time 0200    PT Stop Time 0240    PT Time Calculation (min) 40 min               Past Medical History:  Diagnosis Date   Acute cholecystitis with chronic cholecystitis 07/16/2021   Acute lumbar back pain 03/18/2022   Anxiety    Childhood asthma    Depression    Major Depression disorder   Diabetes mellitus without complication [facility])    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HCC)    Dyslipidemia 07/17/2021   Headache    Migraines   History of abnormal uterine bleeding 07/17/2021   Schizophrenia (HCC)    Urticaria    Past Surgical History:  Procedure Laterality Date   CHOLECYSTECTOMY N/A 07/17/2021   Procedure: SINGLE SITE LAPAROSCOPIC CHOLECYSTECTOMY WITH INTRAOPERATIVE CHOLANGIOGRAM; LIVER BIOPSY;  Surgeon: Karie Soda, MD;  Location: [facility];  Service: General;  Laterality: N/A;   IR EPIDUROGRAPHY  03/21/2022   LUMBAR LAMINECTOMY/ DECOMPRESSION WITH MET-RX Right 06/06/2022   Procedure: Right Lumbar three-four Minimally Invasive Surgery Laminectomy/Discectomy;  Surgeon: Jadene Pierini, MD;  Location: [facility] OR;  Service: Neurosurgery;  Laterality: Right;   WISDOM TOOTH EXTRACTION     Patient Active Problem List   Diagnosis Date Noted   BMI 40.0-44.9, adult (HCC) 04/09/2023   Abnormal antenatal AFP screen 03/25/2023   Preexisting diabetes complicating pregnancy, antepartum 03/25/2023   Family history of spina bifida 02/27/2023   Supervision of high risk pregnancy, antepartum 02/22/2023   Lumbar  radiculopathy 06/06/2022   Syncope 03/23/2022   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/2021   Schizophrenia (HCC) 07/17/2021   Depression 07/17/2021   Insulin-requiring or dependent type II diabetes mellitus (HCC) 07/17/2021    PCP: Georganna Skeans, MD   REFERRING PROVIDER: Arman Bogus, MD   REFERRING DIAG: M54.5: Low back pain  Rationale for Evaluation and Treatment: Rehabilitation  THERAPY DIAG:  Chronic right-sided low back pain with right-sided sciatica  Difficulty in walking, not elsewhere classified  ONSET DATE: For approx 2 monhs  SUBJECTIVE:  SUBJECTIVE STATEMENT: Pain at 6/10 low back. I had a lot of appts today and have been active a lot. My leg was having symptoms a lot this weekend.    EVAL: Pt reports approx 2 months ago she started developing R low back with intermittent R LE pain and N/T, 2 days/week. Pt associates her increase in pain with being pregnant. Pt is currently 5 months pregnant. Pt notes the R LE pain and N/T occurs primarily when lying down and she has difficulty with ascending the 1st step for a flight of stairs, for which she receives physical assistance. Pt has a Hx of Right Lumbar three-four Minimally Invasive Surgery Laminectomy/Discectomy completed on 06/06/22. Following surgery and prior to pregnancy, she notes her low back and R LE pain only occurred infrequently and resolved within 24 hrs with taking ibuprofen.  PERTINENT HISTORY:  Right Lumbar three-four Minimally Invasive Surgery Laminectomy/Discectomy completed on 06/06/23. See PMH.  PAIN:  Are you having pain? Yes: NPRS scale: 3/10 Pain location: low back, intermittent R leg pain N/T (2x per week, esp with lying down. Pt lies in her sides) Pain- ant thigh, N/T-ant lower leg. Pain description: ache,  squeeze  Aggravating factors: Lying down Relieving factors: Sitting, pregnancy pillow  PRECAUTIONS: None  RED FLAGS: None   WEIGHT BEARING RESTRICTIONS: No  FALLS:  Has patient fallen in last 6 months? No  LIVING ENVIRONMENT: Lives with: lives with their family Lives in: [employer] assistance with initial first step with asceneding  OCCUPATION: Not working  PLOF: Independent  PATIENT GOALS: Pain management  NEXT MD VISIT: To see Dr. Yetta Barre if needed  OBJECTIVE:  Note: Objective measures were completed at Evaluation unless otherwise noted.  DIAGNOSTIC FINDINGS:  Lumbar Spine 04/27/2022  MPRESSION: 1. Right subarticular disc protrusion with inferior migration at L3-4, impinging upon the descending right L4 nerve root in the right lateral recess. This is similar but perhaps slightly decreased in size as compared to previous MRI from 03/16/2022. 2. Central to left foraminal disc protrusion at L2-3 with resultant mild left lateral recess stenosis. 3. Central disc protrusion at L5-S1 without significant stenosis or impingement.  PATIENT SURVEYS:  Modified Oswestry 3/50= 6%   COGNITION: Overall cognitive status: Within functional limits for tasks assessed     SENSATION: WFL  MUSCLE LENGTH: Hamstrings: Right NT deg; Left NT deg Maisie Fus test: Right NT deg; Left NT deg  POSTURE: rounded shoulders and forward head  PALPATION: Not TTP to the low back area  LUMBAR ROM:  NT AROM 04/04/23 04/19/23  Flexion WFL min pain  WFL Pain with return  Extension [facility] felt good  [facility] felt good   Right lateral flexion WFL    Left lateral flexion Pain on Rt    Right rotation WFL   Left rotation WFL     (Blank rows = not tested)  LOWER EXTREMITY ROM:    Grossly WNL's   Active  Right eval Left eval  Hip flexion    Hip extension    Hip abduction    Hip adduction    Hip internal rotation    Hip external rotation    Knee flexion    Knee extension    Ankle  dorsiflexion    Ankle plantarflexion    Ankle inversion    Ankle eversion     (Blank rows = not tested)  LOWER EXTREMITY MMT:   Myotome screen negative MMT Right eval Left eval  Hip flexion 4+ 4+  Hip extension  Hip abduction 4+ 4+  Hip adduction    Hip internal rotation    Hip external rotation 4+ 4+  Knee flexion 5 5  Knee extension    Ankle dorsiflexion 5 5  Ankle plantarflexion 5 5  Ankle inversion    Ankle eversion     (Blank rows = not tested)  LUMBAR SPECIAL TESTS:  Straight leg raise test: Negative, Slump test: Negative, and SI Compression/distraction test: Negative  FUNCTIONAL TESTS:  NT  GAIT: Distance walked: 200' Assistive device utilized: None Level of assistance: Complete Independence Comments: WNLs  TREATMENT DATE:  [facility] Adult PT Treatment:                                                DATE: 04/24/23 Therapeutic Activity: PPT  LTR SKTC Bridge  Childs pose  Cat camel Qped Alt UE Qped Alt LE  Bird dog  Side lying hip abduction right , left Side lying hip clam Right , left STS x 10 STS with RLE back x 10 Standing shoulder work for posture:  Horizontal abduction green band 2 x 10  Row and Extension green band 2 x 15     [facility] Adult PT Treatment:                                                DATE: 04/19/23 Therapeutic Activity PPT  SKTC Bridge  LTR Childs pose  Cat camel Qped Alt UE Qped Alt LE  Side lying hip abduction right , left Side lying hip clam Right , left STS x 10 STS with RLE back x 10     [facility] Adult PT Treatment:                                                DATE: 04/17/23 Therapeutic Activity: Lumbar stabilization exercises Ant/post pelvic rocks x10 Supine post pelvic tilt x 10  LTR  PPT with marching 2x10 Bridge x 10  Side hip abduction x 10 Side hip clam x 10  Quadruped cat camel  Childs pose Alternating fire hydrants in qped Alternating hip ext with knee flexed - from support qped  Marjo Bicker pose with  thread the needle  Qped Alt LE -weakness on right STS x 10  STS with RLE back x10 Heel raises x20  Standing shoulder work for posture:  Horizontal abduction green band 2 x 10  Row and Extension green band 2 x 15     [facility] Adult PT Treatment:                                                DATE: 04/12/23 Therapeutic Activity: Lumbar stabilization exercises Supine post pelvic tilt x 10  LTR  PPT with marching 1 x 10 Bridge x 10  SLR left x 10, right heel slide to R march x 10 Quadruped cat camel  Childs pose Marjo Bicker pose with thread the needle  Qped Alt LE -weakness on right STS  x 10  STS with RLE back x10 Heel raises x 20 Standing march  Standing hip ext x15  Standing shoulder work for posture:  Horizontal abduction green band 2 x 10  Row and Extension green band 2 x 15                                                                                                                           PATIENT EDUCATION:  Education details: Eval findings, POC, HEP, self care  Person educated: Patient Education method: Explanation, Demonstration, Tactile cues, Verbal cues, and Handouts Education comprehension: verbalized understanding, returned demonstration, verbal cues required, and tactile cues required  HOME EXERCISE PROGRAM: Access Code: [email or web address] URL: [email or web address] Date: 04/12/2023 Prepared by: Joellyn Rued  Exercises - Supine Pelvic Rocking  - 2 x daily - 7 x weekly - 1 sets - 10 reps - Supine Posterior Pelvic Tilt with Pelvic Floor Contraction  - 2 x daily - 7 x weekly - 1 sets - 10 reps - 3 hold - Supine Bridge  - 2 x daily - 7 x weekly - 1 sets - 10 reps - 3 hold - Cat Cow  - 2 x daily - 7 x weekly - 3 sets - 10 reps - 5-10 hold - Child's Pose Stretch  - 2 x daily - 7 x weekly - 3 sets - 10 reps - 5-10 hold - Standing Shoulder Horizontal Abduction with Resistance  - 1 x daily - 7 x weekly - 2 sets - 10 reps - 5 hold - Shoulder extension with resistance  - Neutral  - 1 x daily - 7 x weekly - 2 sets - 10 reps - 5 hold  ASSESSMENT:  CLINICAL IMPRESSION: Pt reports inreased back pain today due to increased activity today with Dr. Tyrell Antonio. She is also having intermittent bouts of right leg pain. She does demo improved fluidity of movements especially with Quadruped LE extensions and is able to progress to full bird dogs today. Continued with lumbar mobility and core strengthening with pt reporting improvement post session. Pt will continue to benefit from skilled PT to address impairments for improved  back function c minimized pain.  OBJECTIVE IMPAIRMENTS: decreased activity tolerance, difficulty walking, decreased strength, obesity, pain, and pregnancy .   ACTIVITY LIMITATIONS: carrying, lifting, bending, standing, and sleeping  PARTICIPATION LIMITATIONS: meal prep, cleaning, laundry, and community activity  PERSONAL FACTORS: Fitness, Past/current experiences, and 1 comorbidity: High BMI  are also affecting patient's functional outcome.   REHAB POTENTIAL: Good  CLINICAL DECISION MAKING: Evolving/moderate complexity  EVALUATION COMPLEXITY: Moderate   GOALS:   SHORT TERM GOALS = LTGs  LONG TERM GOALS: Target date: 05/18/23  Pt will be Ind in a final HEP to assist in the management of her low  back pain during pregnancy  Baseline: Initiated Goal status: INITIAL  2.  Pt will report overall improvement in low back pain of 50% or greater for improved ability to function and QOL Baseline:1-8/10  Goal status: INITIAL  3.  Pt will voice understanding of measures to assist in the management of her low back pain Baseline:  Goal status: INITIAL  PLAN:  PT FREQUENCY: 2x/week  PT DURATION: 6 weeks  PLANNED INTERVENTIONS: 97164- PT Re-evaluation, 97110-Therapeutic exercises, 97530- Therapeutic activity, 97535- Self Care, 25366- Manual therapy, Patient/Family education, Taping, Cryotherapy, and Moist heat.  PLAN FOR NEXT SESSION: Assess  response to HEP; progress therex as indicated; use of manual therapy as indicated.  Lakeasha Petion, PTA 04/24/23 3:48 PM Phone: [phone] Fax: [phone]

## 2023-04-24 NOTE — Progress Notes (Cosign Needed)
 [facility] at [facility] [address] Phone:  [phone]   Fax:  [phone]      In-Person Genetic Counseling Clinic Note:   I spoke with 28 y.o. Melissa Gilmore [place] today to discuss msAFP results. She was referred by Melissa Skeans, MD.    Pregnancy History:    G1P0. EGA: [redacted]w[redacted]d by LMP. EDD: 08/19/2023. Personal history of diabetes. She is taking medication for this. Denies personal history of high blood pressure, thyroid conditions, and seizures. Denies bleeding, infections, and fevers in this pregnancy. Denies using tobacco, alcohol, or street drugs in this pregnancy.   Family History:    A three-generation pedigree was created and scanned into Epic under the Media tab.  Melissa Gilmore reports her 49 yo sister has spina bifida "covered by skin" that was dx. at 27 yo. She had surgery a few months after the diagnosis. She needs self-catheterization and can walk. She also has acute kidney disease, and she is reported to have needed renal and urinary surgeries. She was also born with a "hole in her heart" and needed surgery for this. Melissa Gilmore also reports that her sister has very low platelet levels, especially after infections. She had events of increased nosebleeds and needed hospitalization and blood transfusions. She is not reported to have any genetic testing, and Melissa Gilmore does not know if her sister has been diagnosed with a specific condition. We discussed that these findings may be associated with a genetic or chromosomal condition or may be unrelated and multifactorial/environmental. We reviewed that without a known diagnosis, medical reports, or genetic testing reports, recurrence risk is difficult to estimate and can be up to 50% in the event of a genetic condition. I suggested that her family speak with her sister's provider to discuss the option of referral to adult genetics where her personal and family history can be  reviewed and genetic testing can be ordered if indicated.   Maternal ethnicity reported as White and paternal ethnicity reported as White. Denies Ashkenazi Jewish ancestry.  Family history not remarkable for consanguinity, intellectual disability, autism spectrum disorder, multiple spontaneous abortions, still births, or unexplained neonatal death.  Elevated msAFP:   Maternal Serum AFP (msAFP) is a maternal blood test that measures maternal serum AFP levels to determine if a pregnancy is at higher risk for certain birth defects; however, it cannot diagnose or rule out these conditions.  msAFP screening can identify approximately 80% of open neural tube defects (ONTDs). Increased msAFP levels can be associated with neural tube defects such as spina bifida and anencephaly, abdominal wall defects, certain genetic conditions, inaccurate pregnancy dating, presence of twins, pregnancy complications, and normal variation.  Melissa Gilmore had a msAFP level of 2.29 MoMs incomparison to the cutoff value of 2.0 MoMs, indicating elevated AFP in her bloodstream. Based on this result, the risk for an open neural tube defect is 1 in 130 (~0.8%).  We reviewed available options including anatomy ultrasounds an amniocentesis. We reviewed that the anatomy ultrasound can detect most types of neural tube defects. Melissa Gilmore had an anatomy ultrasound performed on 2/18 that was normal.  Moreover, unexplained elevations of msAFP have been associated with adverse pregnancy outcomes such as low birth weight, preterm labor, pre-eclampsia, or fetal demise. For this reason, growth ultrasounds are recommended. These have been scheduled.  We discussed that Melissa Gilmore has the option of continuing to monitor the pregnancy via routine ultrasounds with the understanding that a normal ultrasound does not  guarantee a healthy pregnancy. Melissa Gilmore elected this and declined diagnostic testing.  We reviewed there is a 2% recurrence risk for an  affected child with ONTD if the parent's sibling is affected.     Newborn Screening. The [place] Newborn Screening (NBS) program will screen all newborn babies for cystic fibrosis, spinal muscular atrophy, hemoglobinopathies, and numerous other conditions.  Previous Testing Completed:  Low risk NIPS: Melissa Gilmore previously completed noninvasive prenatal screening (NIPS) in this pregnancy. The result is low risk, consistent with a female fetus. This screening significantly reduces but does not eliminate the chance that the current pregnancy has Down syndrome (trisomy 27), trisomy 35, trisomy 64, common sex chromosome conditions, and 22q11.2 microdeletion syndrome. Please see report for details. There are many genetic conditions that cannot be detected by NIPS.   Negative carrier screening: Melissa Gilmore previously completed carrier screening. She screened to not be a carrier for cystic fibrosis (CF), spinal muscular atrophy (SMA), alpha thalassemia, and beta hemoglobinopathies. Please see report for details. A negative result on carrier screening reduces but does not eliminate the chance of being a carrier.    Plan of Care:   Routine prenatal care.   Informed consent was obtained. All questions were answered.   85 minutes were spent on the date of the encounter in service to the patient including preparation, face-to-face consultation, discussion of test reports and available next steps, pedigree construction, genetic risk assessment, documentation, and care coordination.    Thank you for sharing in the care of Melissa Gilmore with [place].  Please do not hesitate to contact us at [phone] if you have any questions.   Sheppard Plumber, MS, [facility] Certified Genetic Counselor   Genetic counseling student involved in appointment: No.   I provided general supervision for this patient and was immediately available for any patient care concerns. [employer]

## 2023-04-26 ENCOUNTER — Ambulatory Visit: Payer: MEDICAID | Admitting: Physical Therapy

## 2023-04-26 ENCOUNTER — Encounter: Payer: Self-pay | Admitting: Physical Therapy

## 2023-04-26 DIAGNOSIS — M6281 Muscle weakness (generalized): Secondary | ICD-10-CM

## 2023-04-26 DIAGNOSIS — R262 Difficulty in walking, not elsewhere classified: Secondary | ICD-10-CM

## 2023-04-26 DIAGNOSIS — M5441 Lumbago with sciatica, right side: Secondary | ICD-10-CM | POA: Diagnosis not present

## 2023-04-26 DIAGNOSIS — G8929 Other chronic pain: Secondary | ICD-10-CM

## 2023-04-26 NOTE — Therapy (Signed)
 OUTPATIENT PHYSICAL THERAPY THORACOLUMBAR NOTE   Patient Name: Melissa Gilmore MRN: [id number] DOB:1996-01-28, 28 y.o., female Today's Date: 04/26/2023  END OF SESSION:  PT End of Session - 04/26/23 1403     Visit Number 8    Number of Visits 13    Date for PT Re-Evaluation 05/18/23    Authorization Type [employer]    Authorization Time Period 03/28/23-05/12/23    Authorization - Visit Number 8    Authorization - Number of Visits 12    PT Start Time 0200    PT Stop Time 0240    PT Time Calculation (min) 40 min               Past Medical History:  Diagnosis Date   Acute cholecystitis with chronic cholecystitis 07/16/2021   Acute lumbar back pain 03/18/2022   Anxiety    Childhood asthma    Depression    Major Depression disorder   Diabetes mellitus without complication [facility])    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HCC)    Dyslipidemia 07/17/2021   Headache    Migraines   History of abnormal uterine bleeding 07/17/2021   Schizophrenia (HCC)    Urticaria    Past Surgical History:  Procedure Laterality Date   CHOLECYSTECTOMY N/A 07/17/2021   Procedure: SINGLE SITE LAPAROSCOPIC CHOLECYSTECTOMY WITH INTRAOPERATIVE CHOLANGIOGRAM; LIVER BIOPSY;  Surgeon: Karie Soda, MD;  Location: [facility] ORS;  Service: General;  Laterality: N/A;   IR EPIDUROGRAPHY  03/21/2022   LUMBAR LAMINECTOMY/ DECOMPRESSION WITH MET-RX Right 06/06/2022   Procedure: Right Lumbar three-four Minimally Invasive Surgery Laminectomy/Discectomy;  Surgeon: Jadene Pierini, MD;  Location: [facility] OR;  Service: Neurosurgery;  Laterality: Right;   WISDOM TOOTH EXTRACTION     Patient Active Problem List   Diagnosis Date Noted   BMI 40.0-44.9, adult (HCC) 04/09/2023   Abnormal antenatal AFP screen 03/25/2023   Preexisting diabetes complicating pregnancy, antepartum 03/25/2023   Family history of spina bifida 02/27/2023   Supervision of high risk pregnancy, antepartum 02/22/2023   Lumbar  radiculopathy 06/06/2022   Syncope 03/23/2022   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/2021   Schizophrenia (HCC) 07/17/2021   Depression 07/17/2021   Insulin-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 It was worse yesterday with sitting at the ED with my Fiance.   EVAL: Pt reports approx 2 months ago she started developing R low back with intermittent R LE pain and N/T, 2 days/week. Pt associates her increase in pain with being pregnant. Pt is currently 5 months pregnant. Pt notes the R LE pain and N/T occurs primarily when lying down and she has difficulty with ascending the 1st step for a flight of stairs, for which she receives physical assistance. Pt has a Hx of Right Lumbar three-four Minimally Invasive Surgery Laminectomy/Discectomy completed on 06/06/22. Following surgery and prior to pregnancy, she notes her low back and R LE pain only occurred infrequently and resolved within 24 hrs with taking ibuprofen.  PERTINENT HISTORY:  Right Lumbar three-four Minimally Invasive Surgery Laminectomy/Discectomy completed on 06/06/23. See PMH.  PAIN:  Are you having pain? Yes: NPRS scale: 3/10 Pain location: low back, intermittent R leg pain N/T (2x per week, esp with lying down. Pt lies in her sides) Pain- ant thigh, N/T-ant lower leg. Pain description: ache, squeeze   Aggravating factors: Lying down Relieving factors: Sitting, pregnancy pillow  PRECAUTIONS: None  RED FLAGS: None   WEIGHT BEARING RESTRICTIONS: No  FALLS:  Has patient fallen in last 6 months? No  LIVING ENVIRONMENT: Lives with: lives with their family Lives in: [employer] assistance with initial first step with asceneding  OCCUPATION: Not working  PLOF: Independent  PATIENT GOALS: Pain management  NEXT MD VISIT: To see Dr. Yetta Barre if needed  OBJECTIVE:  Note: Objective measures were completed at Evaluation unless otherwise noted.  DIAGNOSTIC FINDINGS:  Lumbar Spine 04/27/2022  MPRESSION: 1. Right subarticular disc protrusion with inferior migration at L3-4, impinging upon the descending right L4 nerve root in the right lateral recess. This is similar but perhaps slightly decreased in size as compared to previous MRI from 03/16/2022. 2. Central to left foraminal disc protrusion at L2-3 with resultant mild left lateral recess stenosis. 3. Central disc protrusion at L5-S1 without significant stenosis or impingement.  PATIENT SURVEYS:  Modified Oswestry 3/50= 6%   COGNITION: Overall cognitive status: Within functional limits for tasks assessed     SENSATION: WFL  MUSCLE LENGTH: Hamstrings: Right NT deg; Left NT deg Maisie Fus test: Right NT deg; Left NT deg  POSTURE: rounded shoulders and forward head  PALPATION: Not TTP to the low back area  LUMBAR ROM:  NT AROM 04/04/23 04/19/23  Flexion WFL min pain  WFL Pain with return  Extension [facility] felt good  [facility] felt good   Right lateral flexion WFL    Left lateral flexion Pain on Rt    Right rotation WFL   Left rotation WFL     (Blank rows = not tested)  LOWER EXTREMITY ROM:    Grossly WNL's   Active  Right eval Left eval  Hip flexion    Hip extension    Hip abduction    Hip adduction    Hip internal rotation    Hip external rotation    Knee flexion    Knee extension    Ankle  dorsiflexion    Ankle plantarflexion    Ankle inversion    Ankle eversion     (Blank rows = not tested)  LOWER EXTREMITY MMT:   Myotome screen negative MMT Right eval Left eval  Hip flexion 4+ 4+  Hip extension    Hip abduction 4+ 4+  Hip adduction    Hip  internal rotation    Hip external rotation 4+ 4+  Knee flexion 5 5  Knee extension    Ankle dorsiflexion 5 5  Ankle plantarflexion 5 5  Ankle inversion    Ankle eversion     (Blank rows = not tested)  LUMBAR SPECIAL TESTS:  Straight leg raise test: Negative, Slump test: Negative, and SI Compression/distraction test: Negative  FUNCTIONAL TESTS:  NT  GAIT: Distance walked: 200' Assistive device utilized: None Level of assistance: Complete Independence Comments: WNLs  TREATMENT DATE:  [facility] Adult PT Treatment:                                                DATE: 04/26/23 Therapeutic Activity: SKTC PPT  PPT with marching  PPT with GTB clam  PPT to banded bridge GTB LTR Childs pose  Cat camel Bird dog  Side lying hip abduction right , left x  12 each Side lying hip clam Right , left x 20 each  STS x 12 STS with RLE back x 12 Standing shoulder work for posture:  Horizontal abduction green band 1 x 15  Row and Extension green band 2 x 15   [facility] Adult PT Treatment:                                                DATE: 04/24/23 Therapeutic Activity: PPT  LTR SKTC Bridge  Childs pose  Cat camel Qped Alt UE Qped Alt LE  Bird dog  Side lying hip abduction right , left Side lying hip clam Right , left STS x 10 STS with RLE back x 10 Standing shoulder work for posture:  Horizontal abduction green band 2 x 10  Row and Extension green band 2 x 15     [facility] Adult PT Treatment:                                                DATE: 04/19/23 Therapeutic Activity PPT  SKTC Bridge  LTR Childs pose  Cat camel Qped Alt UE Qped Alt LE  Side lying hip abduction right , left Side lying hip clam Right ,  left STS x 10 STS with RLE back x 10     [facility] Adult PT Treatment:                                                DATE: 04/17/23 Therapeutic Activity: Lumbar stabilization exercises Ant/post pelvic rocks x10 Supine post pelvic tilt x 10  LTR  PPT with marching 2x10 Bridge x 10  Side hip abduction x 10 Side hip clam x 10  Quadruped cat camel  Childs pose Alternating fire hydrants in qped Alternating hip ext with knee flexed - from support qped  Marjo Bicker pose with thread the needle  Qped Alt LE -weakness on right STS x 10  STS with RLE back x10 Heel raises x20  Standing shoulder work for posture:  Horizontal abduction green band  2 x 10  Row and Extension green band 2 x 15                                                                                                                           PATIENT EDUCATION:  Education details: Eval findings, POC, HEP, self care  Person educated: Patient Education method: Explanation, Demonstration, Tactile cues, Verbal cues, and Handouts Education comprehension: verbalized understanding, returned demonstration, verbal cues required, and tactile cues required  HOME EXERCISE PROGRAM: Access Code: [email or web address] URL: [email or web address] Date: 04/12/2023 Prepared by: Joellyn Rued  Exercises - Supine Pelvic Rocking  - 2 x daily - 7 x weekly - 1 sets - 10 reps - Supine Posterior Pelvic Tilt with Pelvic Floor Contraction  - 2 x daily - 7 x weekly - 1 sets - 10 reps - 3 hold - Supine Bridge  - 2 x daily - 7 x weekly - 1 sets - 10 reps - 3 hold - Cat Cow  - 2 x daily - 7 x weekly - 3 sets - 10 reps - 5-10 hold - Child's Pose Stretch  - 2 x daily - 7 x weekly - 3 sets - 10 reps - 5-10 hold - Standing Shoulder Horizontal Abduction with Resistance  - 1 x daily - 7 x weekly - 2 sets - 10 reps - 5 hold - Shoulder extension with resistance - Neutral  - 1 x daily - 7 x weekly - 2 sets - 10 reps - 5 hold  ASSESSMENT:  CLINICAL  IMPRESSION: Pt reports decreased back pain today. She is able to complete activity without c/o back pain. She demonstrates increased tolerance to reps and sets. She tolerated increased reps of Bird dogs without fatigue.  Continued with lumbar mobility and core strengthening. Began Nustep for activity tolerance. Pt will continue to benefit from skilled PT to address impairments for improved  back function c minimized pain.  OBJECTIVE IMPAIRMENTS: decreased activity tolerance, difficulty walking, decreased strength, obesity, pain, and pregnancy .   ACTIVITY LIMITATIONS: carrying, lifting, bending, standing, and sleeping  PARTICIPATION LIMITATIONS: meal prep, cleaning, laundry, and community activity  PERSONAL FACTORS: Fitness, Past/current experiences, and 1 comorbidity: High BMI  are also affecting patient's functional outcome.   REHAB POTENTIAL: Good  CLINICAL DECISION MAKING: Evolving/moderate complexity  EVALUATION COMPLEXITY: Moderate   GOALS:   SHORT TERM GOALS = LTGs  LONG TERM GOALS: Target date: 05/18/23  Pt will be Ind in a final HEP to assist in the management of her low  back pain during pregnancy  Baseline: Initiated Goal status: INITIAL  2.  Pt will report overall improvement in low back pain of 50% or greater for improved ability to function and QOL Baseline:1-8/10  Goal status: INITIAL  3.  Pt will voice understanding of measures to assist in the management of her low back pain Baseline:  Goal status: INITIAL  PLAN:  PT FREQUENCY: 2x/week  PT DURATION: 6 weeks  PLANNED INTERVENTIONS: 16109-  PT Re-evaluation, 97110-Therapeutic exercises, 97530- Therapeutic activity, 97535- Self Care, 16109- Manual therapy, Patient/Family education, Taping, Cryotherapy, and Moist heat.  PLAN FOR NEXT SESSION: Assess response to HEP; progress therex as indicated; use of manual therapy as indicated.  Asyria Kolander, PTA 04/26/23 2:37 PM Phone: [phone] Fax: [phone]

## 2023-04-30 ENCOUNTER — Ambulatory Visit: Payer: MEDICAID | Admitting: Obstetrics and Gynecology

## 2023-04-30 ENCOUNTER — Other Ambulatory Visit: Payer: Self-pay | Admitting: *Deleted

## 2023-04-30 ENCOUNTER — Other Ambulatory Visit: Payer: Self-pay

## 2023-04-30 VITALS — BP 113/76 | HR 87 | Wt 238.7 lb

## 2023-04-30 DIAGNOSIS — Z8279 Family history of other congenital malformations, deformations and chromosomal abnormalities: Secondary | ICD-10-CM | POA: Diagnosis not present

## 2023-04-30 DIAGNOSIS — Z3A24 24 weeks gestation of pregnancy: Secondary | ICD-10-CM

## 2023-04-30 DIAGNOSIS — O0990 Supervision of high risk pregnancy, unspecified, unspecified trimester: Secondary | ICD-10-CM

## 2023-04-30 DIAGNOSIS — O0992 Supervision of high risk pregnancy, unspecified, second trimester: Secondary | ICD-10-CM | POA: Diagnosis not present

## 2023-04-30 DIAGNOSIS — Z6841 Body Mass Index (BMI) 40.0 and over, adult: Secondary | ICD-10-CM

## 2023-04-30 DIAGNOSIS — O24312 Unspecified pre-existing diabetes mellitus in pregnancy, second trimester: Secondary | ICD-10-CM | POA: Diagnosis not present

## 2023-04-30 DIAGNOSIS — O24319 Unspecified pre-existing diabetes mellitus in pregnancy, unspecified trimester: Secondary | ICD-10-CM

## 2023-04-30 DIAGNOSIS — O280 Abnormal hematological finding on antenatal screening of mother: Secondary | ICD-10-CM

## 2023-04-30 DIAGNOSIS — O24113 Pre-existing diabetes mellitus, type 2, in pregnancy, third trimester: Secondary | ICD-10-CM

## 2023-04-30 NOTE — Progress Notes (Signed)
   PRENATAL VISIT NOTE  Subjective:  Melissa Gilmore is a 28 y.o. G1P0000 at [redacted]w[redacted]d being seen today for ongoing prenatal care.  She is currently monitored for the following issues for this high-risk pregnancy and has 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HCC); Schizophrenia (HCC); Depression; Insulin-requiring or dependent type II diabetes mellitus (HCC); Syncope; Lumbar radiculopathy; Supervision of high risk pregnancy, antepartum; Family history of spina bifida; Abnormal antenatal AFP screen; Preexisting diabetes complicating pregnancy, antepartum; and BMI 40.0-44.9, adult (HCC) on their problem list.  Patient doing well with no acute concerns today. She reports no complaints.  Denies leaking of fluid.   The following portions of the patient's history were reviewed and updated as appropriate: allergies, current medications, past family history, past medical history, past social history, past surgical history and problem list. Problem list updated.  Objective:   Vitals:   04/30/23 1336  BP: 113/76  Pulse: 87  Weight: 238 lb 11.2 oz (108.3 kg)    Fetal Status: Fetal Heart Rate (bpm): 146         General:  Alert, oriented and cooperative. Patient is in no acute distress.  Skin: Skin is warm and dry. No rash noted.   Cardiovascular: Normal heart rate noted  Respiratory: Normal respiratory effort, no problems with respiration noted  Abdomen: Soft, gravid, appropriate for gestational age.        Pelvic: Cervical exam deferred        Extremities: Normal range of motion.     Mental Status:  Normal mood and affect. Normal behavior. Normal judgment and thought content.   Assessment and Plan:  Pregnancy: G1P0000 at [redacted]w[redacted]d  1. [redacted] weeks gestation of pregnancy (Primary)   2. Preexisting diabetes complicating pregnancy, antepartum FBS: 86-142 PPBS: 115-201, many postprandials are elevated  Pt advised to increase to 37 units, if well tolerated and no hypoglycemia may also increase to 39  units but stop there until seen.  3. Supervision of high risk pregnancy, antepartum Continue routine prenatal care  4. Family history of spina bifida   5. BMI 40.0-44.9, adult (HCC)   6. Abnormal antenatal AFP screen 1/130 OSB risk  Preterm labor symptoms and general obstetric precautions including but not limited to vaginal bleeding, contractions, leaking of fluid and fetal movement were reviewed in detail with the patient.  Please refer to After Visit Summary for other counseling recommendations.   Return in about 2 weeks (around 05/14/2023) for [facility], in person.   Mariel Aloe, MD Faculty Attending Center for [facility]

## 2023-05-01 ENCOUNTER — Encounter: Payer: Self-pay | Admitting: Physical Therapy

## 2023-05-01 ENCOUNTER — Ambulatory Visit: Payer: MEDICAID | Admitting: Physical Therapy

## 2023-05-01 DIAGNOSIS — M5441 Lumbago with sciatica, right side: Secondary | ICD-10-CM | POA: Diagnosis not present

## 2023-05-01 DIAGNOSIS — R262 Difficulty in walking, not elsewhere classified: Secondary | ICD-10-CM

## 2023-05-01 DIAGNOSIS — G8929 Other chronic pain: Secondary | ICD-10-CM

## 2023-05-01 NOTE — Therapy (Signed)
 OUTPATIENT PHYSICAL THERAPY THORACOLUMBAR NOTE   Patient Name: Melissa Gilmore MRN: [id number] DOB:[birth year], 28 y.o., female Today's Date: 05/01/2023  END OF SESSION:  PT End of Session - 05/01/23 1355     Visit Number 9    Number of Visits 13    Date for PT Re-Evaluation 05/18/23    Authorization Type [employer]    Authorization Time Period 03/28/23-05/12/23    Authorization - Visit Number 9    Authorization - Number of Visits 12    PT Start Time 1354    PT Stop Time 1434    PT Time Calculation (min) 40 min               Past Medical History:  Diagnosis Date   Acute cholecystitis with chronic cholecystitis 07/16/2021   Acute lumbar back pain 03/18/2022   Anxiety    Childhood asthma    Depression    Major Depression disorder   Diabetes mellitus without complication (HCC)    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HCC)    Dyslipidemia 07/17/2021   Headache    Migraines   History of abnormal uterine bleeding 07/17/2021   Schizophrenia (HCC)    Urticaria    Past Surgical History:  Procedure Laterality Date   CHOLECYSTECTOMY N/A 07/17/2021   Procedure: SINGLE SITE LAPAROSCOPIC CHOLECYSTECTOMY WITH INTRAOPERATIVE CHOLANGIOGRAM; LIVER BIOPSY;  Surgeon: Karie Soda, MD;  Location: [facility];  Service: General;  Laterality: N/A;   IR EPIDUROGRAPHY  03/21/2022   LUMBAR LAMINECTOMY/ DECOMPRESSION WITH MET-RX Right 06/06/2022   Procedure: Right Lumbar three-four Minimally Invasive Surgery Laminectomy/Discectomy;  Surgeon: Jadene Pierini, MD;  Location: [facility] OR;  Service: Neurosurgery;  Laterality: Right;   WISDOM TOOTH EXTRACTION     Patient Active Problem List   Diagnosis Date Noted   BMI 40.0-44.9, adult (HCC) 04/09/2023   Abnormal antenatal AFP screen 03/25/2023   Preexisting diabetes complicating pregnancy, antepartum 03/25/2023   Family history of spina bifida 02/27/2023   Supervision of high risk pregnancy, antepartum 02/22/2023   Lumbar  radiculopathy 06/06/2022   Syncope 03/23/2022   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/2021   Schizophrenia (HCC) 07/17/2021   Depression 07/17/2021   Insulin-requiring or dependent type II diabetes mellitus (HCC) 07/17/2021    PCP: Georganna Skeans, MD   REFERRING PROVIDER: Arman Bogus, MD   REFERRING DIAG: M54.5: Low back pain  Rationale for Evaluation and Treatment: Rehabilitation  THERAPY DIAG:  Chronic right-sided low back pain with right-sided sciatica  Difficulty in walking, not elsewhere classified  ONSET DATE: For approx 2 monhs  SUBJECTIVE:  SUBJECTIVE STATEMENT: Pain is 1/10. It has been better. And the leg pain has been better.   EVAL: Pt reports approx 2 months ago she started developing R low back with intermittent R LE pain and N/T, 2 days/week. Pt associates her increase in pain with being pregnant. Pt is currently 5 months pregnant. Pt notes the R LE pain and N/T occurs primarily when lying down and she has difficulty with ascending the 1st step for a flight of stairs, for which she receives physical assistance. Pt has a Hx of Right Lumbar three-four Minimally Invasive Surgery Laminectomy/Discectomy completed on 06/06/22. Following surgery and prior to pregnancy, she notes her low back and R LE pain only occurred infrequently and resolved within 24 hrs with taking ibuprofen.  PERTINENT HISTORY:  Right Lumbar three-four Minimally Invasive Surgery Laminectomy/Discectomy completed on 06/06/23. See PMH.  PAIN:  Are you having pain? Yes: NPRS scale: 3/10 Pain location: low back, intermittent R leg pain N/T (2x per week, esp with lying down. Pt lies in her sides) Pain- ant thigh, N/T-ant lower leg. Pain description: ache, squeeze  Aggravating factors: Lying down Relieving  factors: Sitting, pregnancy pillow  PRECAUTIONS: None  RED FLAGS: None   WEIGHT BEARING RESTRICTIONS: No  FALLS:  Has patient fallen in last 6 months? No  LIVING ENVIRONMENT: Lives with: lives with their family Lives in: [employer] assistance with initial first step with asceneding  OCCUPATION: Not working  PLOF: Independent  PATIENT GOALS: Pain management  NEXT MD VISIT: To see Dr. Yetta Barre if needed  OBJECTIVE:  Note: Objective measures were completed at Evaluation unless otherwise noted.  DIAGNOSTIC FINDINGS:  Lumbar Spine 04/27/2022  MPRESSION: 1. Right subarticular disc protrusion with inferior migration at L3-4, impinging upon the descending right L4 nerve root in the right lateral recess. This is similar but perhaps slightly decreased in size as compared to previous MRI from 03/16/2022. 2. Central to left foraminal disc protrusion at L2-3 with resultant mild left lateral recess stenosis. 3. Central disc protrusion at L5-S1 without significant stenosis or impingement.  PATIENT SURVEYS:  Modified Oswestry 3/50= 6%   COGNITION: Overall cognitive status: Within functional limits for tasks assessed     SENSATION: WFL  MUSCLE LENGTH: Hamstrings: Right NT deg; Left NT deg Maisie Fus test: Right NT deg; Left NT deg  POSTURE: rounded shoulders and forward head  PALPATION: Not TTP to the low back area  LUMBAR ROM:  NT AROM 04/04/23 04/19/23 05/01/23  Flexion WFL min pain  WFL Pain with return   Extension [facility] felt good  [facility] felt good    Right lateral flexion [facility]   WFL   Left lateral flexion Pain on Rt   WFL  Right rotation WFL    Left rotation WFL      (Blank rows = not tested)  LOWER EXTREMITY ROM:    Grossly WNL's   Active  Right eval Left eval  Hip flexion    Hip extension    Hip abduction    Hip adduction    Hip internal rotation    Hip external rotation    Knee flexion    Knee extension    Ankle dorsiflexion    Ankle  plantarflexion    Ankle inversion    Ankle eversion     (Blank rows = not tested)  LOWER EXTREMITY MMT:   Myotome screen negative MMT Right eval Left eval  Hip flexion 4+ 4+  Hip extension    Hip abduction 4+ 4+  Hip  adduction    Hip internal rotation    Hip external rotation 4+ 4+  Knee flexion 5 5  Knee extension    Ankle dorsiflexion 5 5  Ankle plantarflexion 5 5  Ankle inversion    Ankle eversion     (Blank rows = not tested)  LUMBAR SPECIAL TESTS:  Straight leg raise test: Negative, Slump test: Negative, and SI Compression/distraction test: Negative  FUNCTIONAL TESTS:  NT  GAIT: Distance walked: 200' Assistive device utilized: None Level of assistance: Complete Independence Comments: WNLs  TREATMENT DATE:  [facility] Adult PT Treatment:                                                DATE: 05/01/23 Therapeutic Exercise: Standing horiz abdct x 20 SKTC PPT  PPT with marching  PPT with GTB clam  PPT to banded bridge GTB LTR Childs pose  Cat camel Bird dog  Side lying hip abduction right , left x  15 each Side lying hip clam Right , left x 10 each GTB   Therapeutic Activity: Nustep L4 x 6 minutes  Standing Row and ext GTB 15 x 2 each  6 inch step up, runners with 1 UE support x 12 each STS 10# x 10 , elevated seat    [facility] Adult PT Treatment:                                                DATE: 04/26/23 Therapeutic Activity: SKTC PPT  PPT with marching  PPT with GTB clam  PPT to banded bridge GTB LTR Childs pose  Cat camel Bird dog  Side lying hip abduction right , left x  12 each Side lying hip clam Right , left x 20 each    [facility] Adult PT Treatment:                                                DATE: 04/24/23 Therapeutic Activity: PPT  LTR SKTC Bridge  Childs pose  Cat camel Qped Alt UE Qped Alt LE  Bird dog  Side lying hip abduction right , left Side lying hip clam Right , left STS x 10 STS with RLE back x 10 Standing shoulder work  for posture:  Horizontal abduction green band 2 x 10  Row and Extension green band 2 x 15     [facility] Adult PT Treatment:                                                DATE: 04/19/23 Therapeutic Activity PPT  SKTC Bridge  LTR Childs pose  Cat camel Qped Alt UE Qped Alt LE  Side lying hip abduction right , left Side lying hip clam Right , left STS x 10 STS with RLE back x 10     [facility] Adult PT Treatment:  DATE: 04/17/23 Therapeutic Activity: Lumbar stabilization exercises Ant/post pelvic rocks x10 Supine post pelvic tilt x 10  LTR  PPT with marching 2x10 Bridge x 10  Side hip abduction x 10 Side hip clam x 10  Quadruped cat camel  Childs pose Alternating fire hydrants in qped Alternating hip ext with knee flexed - from support qped  Marjo Bicker pose with thread the needle  Qped Alt LE -weakness on right STS x 10  STS with RLE back x10 Heel raises x20  Standing shoulder work for posture:  Horizontal abduction green band 2 x 10  Row and Extension green band 2 x 15                                                                                                                           PATIENT EDUCATION:  Education details: Eval findings, POC, HEP, self care  Person educated: Patient Education method: Explanation, Demonstration, Tactile cues, Verbal cues, and Handouts Education comprehension: verbalized understanding, returned demonstration, verbal cues required, and tactile cues required  HOME EXERCISE PROGRAM: Access Code: [email or web address] URL: [email or web address] Date: 04/12/2023 Prepared by: Joellyn Rued  Exercises - Supine Pelvic Rocking  - 2 x daily - 7 x weekly - 1 sets - 10 reps - Supine Posterior Pelvic Tilt with Pelvic Floor Contraction  - 2 x daily - 7 x weekly - 1 sets - 10 reps - 3 hold - Supine Bridge  - 2 x daily - 7 x weekly - 1 sets - 10 reps - 3 hold - Cat Cow  - 2 x daily - 7 x weekly - 3 sets -  10 reps - 5-10 hold - Child's Pose Stretch  - 2 x daily - 7 x weekly - 3 sets - 10 reps - 5-10 hold - Standing Shoulder Horizontal Abduction with Resistance  - 1 x daily - 7 x weekly - 2 sets - 10 reps - 5 hold - Shoulder extension with resistance - Neutral  - 1 x daily - 7 x weekly - 2 sets - 10 reps - 5 hold  ASSESSMENT:  CLINICAL IMPRESSION: Pt reports the back pain is better today as well as the leg pain. Continued with lumbar mobility and core strengthening. Continued Nustep for activity tolerance. Able to progress to step up with good tolerance. Pt will continue to benefit from skilled PT to address impairments for improved  back function c minimized pain.  OBJECTIVE IMPAIRMENTS: decreased activity tolerance, difficulty walking, decreased strength, obesity, pain, and pregnancy .   ACTIVITY LIMITATIONS: carrying, lifting, bending, standing, and sleeping  PARTICIPATION LIMITATIONS: meal prep, cleaning, laundry, and community activity  PERSONAL FACTORS: Fitness, Past/current experiences, and 1 comorbidity: High BMI  are also affecting patient's functional outcome.   REHAB POTENTIAL: Good  CLINICAL DECISION MAKING: Evolving/moderate complexity  EVALUATION COMPLEXITY: Moderate   GOALS:   SHORT TERM GOALS = LTGs  LONG TERM GOALS: Target date: 05/18/23  Pt will be Ind in a final HEP  to assist in the management of her low  back pain during pregnancy  Baseline: Initiated Goal status: INITIAL  2.  Pt will report overall improvement in low back pain of 50% or greater for improved ability to function and QOL Baseline:1-8/10  Goal status: INITIAL  3.  Pt will voice understanding of measures to assist in the management of her low back pain Baseline:  Goal status: INITIAL  PLAN:  PT FREQUENCY: 2x/week  PT DURATION: 6 weeks  PLANNED INTERVENTIONS: 97164- PT Re-evaluation, 97110-Therapeutic exercises, 97530- Therapeutic activity, 97535- Self Care, 60454- Manual therapy,  Patient/Family education, Taping, Cryotherapy, and Moist heat.  PLAN FOR NEXT SESSION: Assess response to HEP; progress therex as indicated; use of manual therapy as indicated.  Clodagh Odenthal, PTA 05/01/23 2:28 PM Phone: [phone] Fax: [phone]

## 2023-05-03 ENCOUNTER — Ambulatory Visit: Payer: MEDICAID | Admitting: Physical Therapy

## 2023-05-03 ENCOUNTER — Encounter: Payer: Self-pay | Admitting: Physical Therapy

## 2023-05-03 DIAGNOSIS — G8929 Other chronic pain: Secondary | ICD-10-CM

## 2023-05-03 DIAGNOSIS — M5441 Lumbago with sciatica, right side: Secondary | ICD-10-CM | POA: Diagnosis not present

## 2023-05-03 NOTE — Therapy (Signed)
 OUTPATIENT PHYSICAL THERAPY THORACOLUMBAR NOTE   Patient Name: Melissa Gilmore MRN: [id number] DOB:1995-08-04, 28 y.o., female Today's Date: 05/03/2023  END OF SESSION:  PT End of Session - 05/03/23 1411     Visit Number 10    Number of Visits 13    Date for PT Re-Evaluation 05/18/23    Authorization Type [employer]    Authorization Time Period 03/28/23-05/12/23    Authorization - Visit Number 10    Authorization - Number of Visits 12    PT Start Time 0202    PT Stop Time 0245    PT Time Calculation (min) 43 min               Past Medical History:  Diagnosis Date   Acute cholecystitis with chronic cholecystitis 07/16/2021   Acute lumbar back pain 03/18/2022   Anxiety    Childhood asthma    Depression    Major Depression disorder   Diabetes mellitus without complication [facility])    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HCC)    Dyslipidemia 07/17/2021   Headache    Migraines   History of abnormal uterine bleeding 07/17/2021   Schizophrenia (HCC)    Urticaria    Past Surgical History:  Procedure Laterality Date   CHOLECYSTECTOMY N/A 07/17/2021   Procedure: SINGLE SITE LAPAROSCOPIC CHOLECYSTECTOMY WITH INTRAOPERATIVE CHOLANGIOGRAM; LIVER BIOPSY;  Surgeon: Karie Soda, MD;  Location: [facility];  Service: General;  Laterality: N/A;   IR EPIDUROGRAPHY  03/21/2022   LUMBAR LAMINECTOMY/ DECOMPRESSION WITH MET-RX Right 06/06/2022   Procedure: Right Lumbar three-four Minimally Invasive Surgery Laminectomy/Discectomy;  Surgeon: Jadene Pierini, MD;  Location: [facility] OR;  Service: Neurosurgery;  Laterality: Right;   WISDOM TOOTH EXTRACTION     Patient Active Problem List   Diagnosis Date Noted   BMI 40.0-44.9, adult (HCC) 04/09/2023   Abnormal antenatal AFP screen 03/25/2023   Preexisting diabetes complicating pregnancy, antepartum 03/25/2023   Family history of spina bifida 02/27/2023   Supervision of high risk pregnancy, antepartum 02/22/2023   Lumbar  radiculopathy 06/06/2022   Syncope 03/23/2022   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/2021   Schizophrenia (HCC) 07/17/2021   Depression 07/17/2021   Insulin-requiring or dependent type II diabetes mellitus (HCC) 07/17/2021    PCP: Georganna Skeans, MD   REFERRING PROVIDER: Arman Bogus, MD   REFERRING DIAG: M54.5: Low back pain  Rationale for Evaluation and Treatment: Rehabilitation  THERAPY DIAG:  Chronic right-sided low back pain with right-sided sciatica  ONSET DATE: For approx 2 monhs  SUBJECTIVE:  SUBJECTIVE STATEMENT: Pain is 1/10. It has been better. And the leg pain has been better.   EVAL: Pt reports approx 2 months ago she started developing R low back with intermittent R LE pain and N/T, 2 days/week. Pt associates her increase in pain with being pregnant. Pt is currently 5 months pregnant. Pt notes the R LE pain and N/T occurs primarily when lying down and she has difficulty with ascending the 1st step for a flight of stairs, for which she receives physical assistance. Pt has a Hx of Right Lumbar three-four Minimally Invasive Surgery Laminectomy/Discectomy completed on 06/06/22. Following surgery and prior to pregnancy, she notes her low back and R LE pain only occurred infrequently and resolved within 24 hrs with taking ibuprofen.  PERTINENT HISTORY:  Right Lumbar three-four Minimally Invasive Surgery Laminectomy/Discectomy completed on 06/06/23. See PMH.  PAIN:  Are you having pain? Yes: NPRS scale: 3/10 Pain location: low back, intermittent R leg pain N/T (2x per week, esp with lying down. Pt lies in her sides) Pain- ant thigh, N/T-ant lower leg. Pain description: ache, squeeze  Aggravating factors: Lying down Relieving factors: Sitting, pregnancy pillow  PRECAUTIONS:  None  RED FLAGS: None   WEIGHT BEARING RESTRICTIONS: No  FALLS:  Has patient fallen in last 6 months? No  LIVING ENVIRONMENT: Lives with: lives with their family Lives in: [employer] assistance with initial first step with asceneding  OCCUPATION: Not working  PLOF: Independent  PATIENT GOALS: Pain management  NEXT MD VISIT: To see Dr. Yetta Barre if needed  OBJECTIVE:  Note: Objective measures were completed at Evaluation unless otherwise noted.  DIAGNOSTIC FINDINGS:  Lumbar Spine 04/27/2022  MPRESSION: 1. Right subarticular disc protrusion with inferior migration at L3-4, impinging upon the descending right L4 nerve root in the right lateral recess. This is similar but perhaps slightly decreased in size as compared to previous MRI from 03/16/2022. 2. Central to left foraminal disc protrusion at L2-3 with resultant mild left lateral recess stenosis. 3. Central disc protrusion at L5-S1 without significant stenosis or impingement.  PATIENT SURVEYS:  Modified Oswestry 3/50= 6%   COGNITION: Overall cognitive status: Within functional limits for tasks assessed     SENSATION: WFL  MUSCLE LENGTH: Hamstrings: Right NT deg; Left NT deg Maisie Fus test: Right NT deg; Left NT deg  POSTURE: rounded shoulders and forward head  PALPATION: Not TTP to the low back area  LUMBAR ROM:  NT AROM 04/04/23 04/19/23 05/01/23  Flexion WFL min pain  WFL Pain with return   Extension [facility] felt good  [facility] felt good    Right lateral flexion [facility]   WFL   Left lateral flexion Pain on Rt   WFL  Right rotation WFL    Left rotation WFL      (Blank rows = not tested)  LOWER EXTREMITY ROM:    Grossly WNL's   Active  Right eval Left eval  Hip flexion    Hip extension    Hip abduction    Hip adduction    Hip internal rotation    Hip external rotation    Knee flexion    Knee extension    Ankle dorsiflexion    Ankle plantarflexion    Ankle inversion    Ankle eversion      (Blank rows = not tested)  LOWER EXTREMITY MMT:   Myotome screen negative MMT Right eval Left eval  Hip flexion 4+ 4+  Hip extension    Hip abduction 4+ 4+  Hip  adduction    Hip internal rotation    Hip external rotation 4+ 4+  Knee flexion 5 5  Knee extension    Ankle dorsiflexion 5 5  Ankle plantarflexion 5 5  Ankle inversion    Ankle eversion     (Blank rows = not tested)  LUMBAR SPECIAL TESTS:  Straight leg raise test: Negative, Slump test: Negative, and SI Compression/distraction test: Negative  FUNCTIONAL TESTS:  NT  GAIT: Distance walked: 200' Assistive device utilized: None  TREATMENT DATE:  [facility] Adult PT Treatment:                                                DATE: 05/03/23 Therapeutic Exercise: Standing horiz abdct x 20 SKTC PPT  PPT with marching  PPT with GTB clam  PPT to banded bridge GTB LTR Childs pose  Cat camel Bird dog  Side lying hip abduction right , left x  15 each Side lying hip clam Right , left x 15 each GTB  Therapeutic Activity: Nustep L4 x 6 minutes  Standing Row and ext GTB 15 x 2 each  6 inch step up, runners with 1 UE support x 12 each STS 10# x 10 , elevated seat       [facility] Adult PT Treatment:                                                DATE: 05/01/23 Therapeutic Exercise: Standing horiz abdct x 20 SKTC PPT  PPT with marching  PPT with GTB clam  PPT to banded bridge GTB LTR Childs pose  Cat camel Bird dog  Side lying hip abduction right , left x  15 each Side lying hip clam Right , left x 10 each GTB   Therapeutic Activity: Nustep L4 x 6 minutes  Standing Row and ext GTB 15 x 2 each  6 inch step up, runners with 1 UE support x 12 each STS 10# x 10 , elevated seat    [facility] Adult PT Treatment:                                                DATE: 04/26/23 Therapeutic Activity: SKTC PPT  PPT with marching  PPT with GTB clam  PPT to banded bridge GTB LTR Childs pose  Cat camel Bird dog  Side lying  hip abduction right , left x  12 each Side lying hip clam Right , left x 20 each    [facility] Adult PT Treatment:                                                DATE: 04/24/23 Therapeutic Activity: PPT  LTR SKTC Bridge  Childs pose  Cat camel Qped Alt UE Qped Alt LE  Bird dog  Side lying hip abduction right , left Side lying hip clam Right , left STS x 10 STS with RLE back x 10 Standing shoulder work for posture:  Horizontal abduction green band 2 x 10  Row and Extension green band 2 x 15     [facility] Adult PT Treatment:                                                DATE: 04/19/23 Therapeutic Activity PPT  SKTC Bridge  LTR Childs pose  Cat camel Qped Alt UE Qped Alt LE  Side lying hip abduction right , left Side lying hip clam Right , left STS x 10 STS with RLE back x 10                                                                                                                             PATIENT EDUCATION:  Education details: Eval findings, POC, HEP, self care  Person educated: Patient Education method: Explanation, Demonstration, Tactile cues, Verbal cues, and Handouts Education comprehension: verbalized understanding, returned demonstration, verbal cues required, and tactile cues required  HOME EXERCISE PROGRAM: Access Code: [email or web address] URL: [email or web address] Date: 04/12/2023 Prepared by: Joellyn Rued  Exercises - Supine Pelvic Rocking  - 2 x daily - 7 x weekly - 1 sets - 10 reps - Supine Posterior Pelvic Tilt with Pelvic Floor Contraction  - 2 x daily - 7 x weekly - 1 sets - 10 reps - 3 hold - Supine Bridge  - 2 x daily - 7 x weekly - 1 sets - 10 reps - 3 hold - Cat Cow  - 2 x daily - 7 x weekly - 3 sets - 10 reps - 5-10 hold - Child's Pose Stretch  - 2 x daily - 7 x weekly - 3 sets - 10 reps - 5-10 hold - Standing Shoulder Horizontal Abduction with Resistance  - 1 x daily - 7 x weekly - 2 sets - 10 reps - 5 hold - Shoulder extension with  resistance - Neutral  - 1 x daily - 7 x weekly - 2 sets - 10 reps - 5 hold  ASSESSMENT:  CLINICAL IMPRESSION: Pt reports the back pain continues to improve and well as less leg pain. Continued with lumbar mobility and core strengthening. Continued Nustep for activity tolerance. Able to continue  to step ups and weighted STS with good tolerance. Pt will continue to benefit from skilled PT to address impairments for improved  back function c minimized pain.  OBJECTIVE IMPAIRMENTS: decreased activity tolerance, difficulty walking, decreased strength, obesity, pain, and pregnancy .   ACTIVITY LIMITATIONS: carrying, lifting, bending, standing, and sleeping  PARTICIPATION LIMITATIONS: meal prep, cleaning, laundry, and community activity  PERSONAL FACTORS: Fitness, Past/current experiences, and 1 comorbidity: High BMI  are also affecting patient's functional outcome.   REHAB POTENTIAL: Good  CLINICAL DECISION MAKING: Evolving/moderate complexity  EVALUATION COMPLEXITY: Moderate   GOALS:   SHORT TERM GOALS = LTGs  LONG TERM  GOALS: Target date: 05/18/23  Pt will be Ind in a final HEP to assist in the management of her low  back pain during pregnancy  Baseline: Initiated Goal status: INITIAL  2.  Pt will report overall improvement in low back pain of 50% or greater for improved ability to function and QOL Baseline:1-8/10  Goal status: INITIAL  3.  Pt will voice understanding of measures to assist in the management of her low back pain Baseline:  Goal status: INITIAL  PLAN:  PT FREQUENCY: 2x/week  PT DURATION: 6 weeks  PLANNED INTERVENTIONS: 97164- PT Re-evaluation, 97110-Therapeutic exercises, 97530- Therapeutic activity, 97535- Self Care, 16109- Manual therapy, Patient/Family education, Taping, Cryotherapy, and Moist heat.  PLAN FOR NEXT SESSION: Assess response to HEP; progress therex as indicated; use of manual therapy as indicated.  Elverna Caffee, PTA 05/03/23 2:46  PM Phone: [phone] Fax: [phone]

## 2023-05-07 ENCOUNTER — Ambulatory Visit: Payer: MEDICAID | Admitting: Physical Therapy

## 2023-05-07 ENCOUNTER — Encounter: Payer: Self-pay | Admitting: Physical Therapy

## 2023-05-07 DIAGNOSIS — R262 Difficulty in walking, not elsewhere classified: Secondary | ICD-10-CM

## 2023-05-07 DIAGNOSIS — G8929 Other chronic pain: Secondary | ICD-10-CM

## 2023-05-07 DIAGNOSIS — M5441 Lumbago with sciatica, right side: Secondary | ICD-10-CM | POA: Diagnosis not present

## 2023-05-07 NOTE — Therapy (Signed)
 OUTPATIENT PHYSICAL THERAPY THORACOLUMBAR NOTE   Patient Name: Melissa Gilmore MRN: [id number] DOB:09/21/1995, 28 y.o., female Today's Date: 05/07/2023  END OF SESSION:  PT End of Session - 05/07/23 1406     Visit Number 11    Number of Visits 13    Date for PT Re-Evaluation 05/18/23    Authorization Type [employer]    Authorization Time Period 03/28/23-05/12/23    Authorization - Visit Number 11    Authorization - Number of Visits 12    PT Start Time 0200    PT Stop Time 0245    PT Time Calculation (min) 45 min               Past Medical History:  Diagnosis Date   Acute cholecystitis with chronic cholecystitis 07/16/2021   Acute lumbar back pain 03/18/2022   Anxiety    Childhood asthma    Depression    Major Depression disorder   Diabetes mellitus without complication [facility])    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HCC)    Dyslipidemia 07/17/2021   Headache    Migraines   History of abnormal uterine bleeding 07/17/2021   Schizophrenia (HCC)    Urticaria    Past Surgical History:  Procedure Laterality Date   CHOLECYSTECTOMY N/A 07/17/2021   Procedure: SINGLE SITE LAPAROSCOPIC CHOLECYSTECTOMY WITH INTRAOPERATIVE CHOLANGIOGRAM; LIVER BIOPSY;  Surgeon: Karie Soda, MD;  Location: [facility];  Service: General;  Laterality: N/A;   IR EPIDUROGRAPHY  03/21/2022   LUMBAR LAMINECTOMY/ DECOMPRESSION WITH MET-RX Right 06/06/2022   Procedure: Right Lumbar three-four Minimally Invasive Surgery Laminectomy/Discectomy;  Surgeon: Jadene Pierini, MD;  Location: [facility] OR;  Service: Neurosurgery;  Laterality: Right;   WISDOM TOOTH EXTRACTION     Patient Active Problem List   Diagnosis Date Noted   BMI 40.0-44.9, adult (HCC) 04/09/2023   Abnormal antenatal AFP screen 03/25/2023   Preexisting diabetes complicating pregnancy, antepartum 03/25/2023   Family history of spina bifida 02/27/2023   Supervision of high risk pregnancy, antepartum 02/22/2023   Lumbar  radiculopathy 06/06/2022   Syncope 03/23/2022   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/2021   Schizophrenia (HCC) 07/17/2021   Depression 07/17/2021   Insulin-requiring or dependent type II diabetes mellitus (HCC) 07/17/2021    PCP: Georganna Skeans, MD   REFERRING PROVIDER: Arman Bogus, MD   REFERRING DIAG: M54.5: Low back pain  Rationale for Evaluation and Treatment: Rehabilitation  THERAPY DIAG:  Chronic right-sided low back pain with right-sided sciatica  Difficulty in walking, not elsewhere classified  ONSET DATE: For approx 2 monhs  SUBJECTIVE:  SUBJECTIVE STATEMENT: Saturday I had pain in back and right leg that resolved with rest. Overall pain is better.   EVAL: Pt reports approx 2 months ago she started developing R low back with intermittent R LE pain and N/T, 2 days/week. Pt associates her increase in pain with being pregnant. Pt is currently 5 months pregnant. Pt notes the R LE pain and N/T occurs primarily when lying down and she has difficulty with ascending the 1st step for a flight of stairs, for which she receives physical assistance. Pt has a Hx of Right Lumbar three-four Minimally Invasive Surgery Laminectomy/Discectomy completed on 06/06/22. Following surgery and prior to pregnancy, she notes her low back and R LE pain only occurred infrequently and resolved within 24 hrs with taking ibuprofen.  PERTINENT HISTORY:  Right Lumbar three-four Minimally Invasive Surgery Laminectomy/Discectomy completed on 06/06/23. See PMH.  PAIN:  Are you having pain? Yes: NPRS scale: 0/10 Pain location: low back, intermittent R leg pain N/T (2x per week, esp with lying down. Pt lies in her sides) Pain- ant thigh, N/T-ant lower leg. Pain description: ache, squeeze  Aggravating factors:  Lying down Relieving factors: Sitting, pregnancy pillow  PRECAUTIONS: None  RED FLAGS: None   WEIGHT BEARING RESTRICTIONS: No  FALLS:  Has patient fallen in last 6 months? No  LIVING ENVIRONMENT: Lives with: lives with their family Lives in: [employer] assistance with initial first step with asceneding  OCCUPATION: Not working  PLOF: Independent  PATIENT GOALS: Pain management  NEXT MD VISIT: To see Dr. Yetta Barre if needed  OBJECTIVE:  Note: Objective measures were completed at Evaluation unless otherwise noted.  DIAGNOSTIC FINDINGS:  Lumbar Spine 04/27/2022  MPRESSION: 1. Right subarticular disc protrusion with inferior migration at L3-4, impinging upon the descending right L4 nerve root in the right lateral recess. This is similar but perhaps slightly decreased in size as compared to previous MRI from 03/16/2022. 2. Central to left foraminal disc protrusion at L2-3 with resultant mild left lateral recess stenosis. 3. Central disc protrusion at L5-S1 without significant stenosis or impingement.  PATIENT SURVEYS:  Modified Oswestry 3/50= 6%   COGNITION: Overall cognitive status: Within functional limits for tasks assessed     SENSATION: WFL  MUSCLE LENGTH: Hamstrings: Right NT deg; Left NT deg Maisie Fus test: Right NT deg; Left NT deg  POSTURE: rounded shoulders and forward head  PALPATION: Not TTP to the low back area  LUMBAR ROM:  NT AROM 04/04/23 04/19/23 05/01/23  Flexion WFL min pain  WFL Pain with return   Extension [facility] felt good  [facility] felt good    Right lateral flexion [facility]   WFL   Left lateral flexion Pain on Rt   WFL  Right rotation WFL    Left rotation WFL      (Blank rows = not tested)  LOWER EXTREMITY ROM:    Grossly WNL's   Active  Right eval Left eval  Hip flexion    Hip extension    Hip abduction    Hip adduction    Hip internal rotation    Hip external rotation    Knee flexion    Knee extension    Ankle  dorsiflexion    Ankle plantarflexion    Ankle inversion    Ankle eversion     (Blank rows = not tested)  LOWER EXTREMITY MMT:   Myotome screen negative MMT Right eval Left eval  Hip flexion 4+ 4+  Hip extension    Hip abduction 4+  4+  Hip adduction    Hip internal rotation    Hip external rotation 4+ 4+  Knee flexion 5 5  Knee extension    Ankle dorsiflexion 5 5  Ankle plantarflexion 5 5  Ankle inversion    Ankle eversion     (Blank rows = not tested)  LUMBAR SPECIAL TESTS:  Straight leg raise test: Negative, Slump test: Negative, and SI Compression/distraction test: Negative  FUNCTIONAL TESTS:  NT  GAIT: Distance walked: 200' Assistive device utilized: None  TREATMENT DATE:  [facility] Adult PT Treatment:                                                DATE: 05/07/23 Therapeutic Exercise: Standing horiz abdct x 20 SKTC PPT  PPT with marching  PPT with GTB clam  PPT to banded bridge GTB LTR Childs pose  Cat camel Bird dog  Side lying hip abduction right , left x  15 each Side lying hip clam Right , left x 15 each Blue TB  Therapeutic Activity: Nustep L4 x 6 minutes  Standing Row Blue and ext Green  TB 15 x 2 each  6 inch step up, runners with 1 UE support x 15 each STS 15# 2 x 10 ,RLE back    [facility] Adult PT Treatment:                                                DATE: 05/03/23 Therapeutic Exercise: Standing horiz abdct x 20 SKTC PPT  PPT with marching  PPT with GTB clam  PPT to banded bridge GTB LTR Childs pose  Cat camel Bird dog  Side lying hip abduction right , left x  15 each Side lying hip clam Right , left x 15 each GTB  Therapeutic Activity: Nustep L4 x 6 minutes  Standing Row and ext GTB 15 x 2 each  6 inch step up, runners with 1 UE support x 12 each STS 10# x 10 , elevated seat       [facility] Adult PT Treatment:                                                DATE: 05/01/23 Therapeutic Exercise: Standing horiz abdct x 20 SKTC PPT   PPT with marching  PPT with GTB clam  PPT to banded bridge GTB LTR Childs pose  Cat camel Bird dog  Side lying hip abduction right , left x  15 each Side lying hip clam Right , left x 10 each GTB   Therapeutic Activity: Nustep L4 x 6 minutes  Standing Row and ext GTB 15 x 2 each  6 inch step up, runners with 1 UE support x 12 each STS 10# x 10 , elevated seat    [facility] Adult PT Treatment:                                                DATE: 04/26/23  Therapeutic Activity: SKTC PPT  PPT with marching  PPT with GTB clam  PPT to banded bridge GTB LTR Childs pose  Cat camel Bird dog  Side lying hip abduction right , left x  12 each Side lying hip clam Right , left x 20 each    [facility] Adult PT Treatment:                                                DATE: 04/24/23 Therapeutic Activity: PPT  LTR SKTC Bridge  Childs pose  Cat camel Qped Alt UE Qped Alt LE  Bird dog  Side lying hip abduction right , left Side lying hip clam Right , left STS x 10 STS with RLE back x 10 Standing shoulder work for posture:  Horizontal abduction green band 2 x 10  Row and Extension green band 2 x 15     [facility] Adult PT Treatment:                                                DATE: 04/19/23 Therapeutic Activity PPT  SKTC Bridge  LTR Childs pose  Cat camel Qped Alt UE Qped Alt LE  Side lying hip abduction right , left Side lying hip clam Right , left STS x 10 STS with RLE back x 10                                                                                                                             PATIENT EDUCATION:  Education details: Eval findings, POC, HEP, self care  Person educated: Patient Education method: Explanation, Demonstration, Tactile cues, Verbal cues, and Handouts Education comprehension: verbalized understanding, returned demonstration, verbal cues required, and tactile cues required  HOME EXERCISE PROGRAM: Access Code: [email or web address] URL:  [email or web address] Date: 04/12/2023 Prepared by: Joellyn Rued  Exercises - Supine Pelvic Rocking  - 2 x daily - 7 x weekly - 1 sets - 10 reps - Supine Posterior Pelvic Tilt with Pelvic Floor Contraction  - 2 x daily - 7 x weekly - 1 sets - 10 reps - 3 hold - Supine Bridge  - 2 x daily - 7 x weekly - 1 sets - 10 reps - 3 hold - Cat Cow  - 2 x daily - 7 x weekly - 3 sets - 10 reps - 5-10 hold - Child's Pose Stretch  - 2 x daily - 7 x weekly - 3 sets - 10 reps - 5-10 hold - Standing Shoulder Horizontal Abduction with Resistance  - 1 x daily - 7 x weekly - 2 sets - 10 reps - 5 hold - Shoulder extension with resistance - Neutral  - 1 x daily -  7 x weekly - 2 sets - 10 reps - 5 hold  ASSESSMENT:  CLINICAL IMPRESSION: Overall back and leg have been better however she does have intermittent pain exacerbations. Continued with lumbar mobility and core strengthening. Continued Nustep for activity tolerance. Able to continue  step ups and weighted STS with good tolerance. Pt will be ready for DC at next visit.   OBJECTIVE IMPAIRMENTS: decreased activity tolerance, difficulty walking, decreased strength, obesity, pain, and pregnancy .   ACTIVITY LIMITATIONS: carrying, lifting, bending, standing, and sleeping  PARTICIPATION LIMITATIONS: meal prep, cleaning, laundry, and community activity  PERSONAL FACTORS: Fitness, Past/current experiences, and 1 comorbidity: High BMI  are also affecting patient's functional outcome.   REHAB POTENTIAL: Good  CLINICAL DECISION MAKING: Evolving/moderate complexity  EVALUATION COMPLEXITY: Moderate   GOALS:   SHORT TERM GOALS = LTGs  LONG TERM GOALS: Target date: 05/18/23  Pt will be Ind in a final HEP to assist in the management of her low  back pain during pregnancy  Baseline: Initiated Goal status: INITIAL  2.  Pt will report overall improvement in low back pain of 50% or greater for improved ability to function and QOL Baseline:1-8/10   Goal status: INITIAL  3.  Pt will voice understanding of measures to assist in the management of her low back pain Baseline:  Goal status: INITIAL  PLAN:  PT FREQUENCY: 2x/week  PT DURATION: 6 weeks  PLANNED INTERVENTIONS: 97164- PT Re-evaluation, 97110-Therapeutic exercises, 97530- Therapeutic activity, 97535- Self Care, 16109- Manual therapy, Patient/Family education, Taping, Cryotherapy, and Moist heat.  PLAN FOR NEXT SESSION: discharge to HEP next visit  Yaa Donnellan, PTA 05/07/23 2:26 PM Phone: [phone] Fax: [phone]

## 2023-05-08 ENCOUNTER — Ambulatory Visit (INDEPENDENT_AMBULATORY_CARE_PROVIDER_SITE_OTHER): Payer: MEDICAID | Admitting: Internal Medicine

## 2023-05-08 ENCOUNTER — Other Ambulatory Visit: Payer: Self-pay

## 2023-05-08 ENCOUNTER — Encounter: Payer: Self-pay | Admitting: Internal Medicine

## 2023-05-08 VITALS — BP 128/70 | HR 82 | Temp 98.0°F | Resp 16 | Ht 64.0 in | Wt 242.7 lb

## 2023-05-08 DIAGNOSIS — J3089 Other allergic rhinitis: Secondary | ICD-10-CM

## 2023-05-08 DIAGNOSIS — T781XXD Other adverse food reactions, not elsewhere classified, subsequent encounter: Secondary | ICD-10-CM | POA: Diagnosis not present

## 2023-05-08 DIAGNOSIS — J302 Other seasonal allergic rhinitis: Secondary | ICD-10-CM | POA: Diagnosis not present

## 2023-05-08 DIAGNOSIS — Z3A25 25 weeks gestation of pregnancy: Secondary | ICD-10-CM

## 2023-05-08 MED ORDER — CETIRIZINE HCL 10 MG PO TABS: 10.0000 mg | ORAL_TABLET | Freq: Every day | ORAL | 11 refills | Status: DC | PRN

## 2023-05-08 MED ORDER — FLUTICASONE PROPIONATE 50 MCG/ACT NA SUSP: 1.0000 | Freq: Every day | NASAL | 11 refills | Status: DC | PRN

## 2023-05-08 NOTE — Therapy (Signed)
 OUTPATIENT PHYSICAL THERAPY THORACOLUMBAR NOTE   Patient Name: Melissa Gilmore MRN: [id number] DOB:October 26, 1995, 28 y.o., female Today's Date: 05/09/2023  END OF SESSION:  PT End of Session - 05/09/23 1422     Visit Number 12    Number of Visits 13    Date for PT Re-Evaluation 05/18/23    Authorization Type [employer]    Authorization Time Period 03/28/23-05/12/23    Authorization - Visit Number 12    Authorization - Number of Visits 12    PT Start Time 1415    PT Stop Time 1458    PT Time Calculation (min) 43 min    Activity Tolerance Patient tolerated treatment well    Behavior During Therapy [facility] for tasks assessed/performed                Past Medical History:  Diagnosis Date   Acute cholecystitis with chronic cholecystitis 07/16/2021   Acute lumbar back pain 03/18/2022   Anxiety    Childhood asthma    Depression    Major Depression disorder   Diabetes mellitus without complication (HCC)    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HCC)    Dyslipidemia 07/17/2021   Headache    Migraines   History of abnormal uterine bleeding 07/17/2021   Schizophrenia (HCC)    Urticaria    Past Surgical History:  Procedure Laterality Date   CHOLECYSTECTOMY N/A 07/17/2021   Procedure: SINGLE SITE LAPAROSCOPIC CHOLECYSTECTOMY WITH INTRAOPERATIVE CHOLANGIOGRAM; LIVER BIOPSY;  Surgeon: Karie Soda, MD;  Location: [facility];  Service: General;  Laterality: N/A;   IR EPIDUROGRAPHY  03/21/2022   LUMBAR LAMINECTOMY/ DECOMPRESSION WITH MET-RX Right 06/06/2022   Procedure: Right Lumbar three-four Minimally Invasive Surgery Laminectomy/Discectomy;  Surgeon: Jadene Pierini, MD;  Location: [facility] OR;  Service: Neurosurgery;  Laterality: Right;   WISDOM TOOTH EXTRACTION     Patient Active Problem List   Diagnosis Date Noted   BMI 40.0-44.9, adult (HCC) 04/09/2023   Abnormal antenatal AFP screen 03/25/2023   Preexisting diabetes complicating pregnancy, antepartum 03/25/2023    Family history of spina bifida 02/27/2023   Supervision of high risk pregnancy, antepartum 02/22/2023   Lumbar radiculopathy 06/06/2022   Syncope 03/23/2022   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/2021   Schizophrenia (HCC) 07/17/2021   Depression 07/17/2021   Insulin-requiring or dependent type II diabetes mellitus (HCC) 07/17/2021    PCP: Georganna Skeans, MD   REFERRING PROVIDER: Arman Bogus, MD   REFERRING DIAG: M54.5: Low back pain  Rationale for Evaluation and Treatment: Rehabilitation  THERAPY DIAG:  Chronic right-sided low back pain with right-sided sciatica  Difficulty in walking, not elsewhere classified  Muscle weakness (generalized)  ONSET DATE: For approx 2 monhs  SUBJECTIVE:  SUBJECTIVE STATEMENT: Pt reports she is now able to assessed stairs at her home [place]. Pt is pleased with the progress she has made in PT.  EVAL: Pt reports approx 2 months ago she started developing R low back with intermittent R LE pain and N/T, 2 days/week. Pt associates her increase in pain with being pregnant. Pt is currently 5 months pregnant. Pt notes the R LE pain and N/T occurs primarily when lying down and she has difficulty with ascending the 1st step for a flight of stairs, for which she receives physical assistance. Pt has a Hx of Right Lumbar three-four Minimally Invasive Surgery Laminectomy/Discectomy completed on 06/06/22. Following surgery and prior to pregnancy, she notes her low back and R LE pain only occurred infrequently and resolved within 24 hrs with taking ibuprofen.  PERTINENT HISTORY:  Right Lumbar three-four Minimally Invasive Surgery Laminectomy/Discectomy completed on 06/06/23. See PMH.  PAIN:  Are you having pain? Yes: NPRS scale: 0/10 Pain location: low back,  intermittent R leg pain N/T (2x per week, esp with lying down. Pt lies in her sides) Pain- ant thigh, N/T-ant lower leg. Pain description: ache, squeeze  Aggravating factors: Lying down Relieving factors: Sitting, pregnancy pillow  PRECAUTIONS: None  RED FLAGS: None   WEIGHT BEARING RESTRICTIONS: No  FALLS:  Has patient fallen in last 6 months? No  LIVING ENVIRONMENT: Lives with: lives with their family Lives in: [employer] assistance with initial first step with asceneding  OCCUPATION: Not working  PLOF: Independent  PATIENT GOALS: Pain management  NEXT MD VISIT: To see Dr. Yetta Barre if needed  OBJECTIVE:  Note: Objective measures were completed at Evaluation unless otherwise noted.  DIAGNOSTIC FINDINGS:  Lumbar Spine 04/27/2022  MPRESSION: 1. Right subarticular disc protrusion with inferior migration at L3-4, impinging upon the descending right L4 nerve root in the right lateral recess. This is similar but perhaps slightly decreased in size as compared to previous MRI from 03/16/2022. 2. Central to left foraminal disc protrusion at L2-3 with resultant mild left lateral recess stenosis. 3. Central disc protrusion at L5-S1 without significant stenosis or impingement.  PATIENT SURVEYS:  Modified Oswestry 3/50= 6%   COGNITION: Overall cognitive status: Within functional limits for tasks assessed     SENSATION: WFL  MUSCLE LENGTH: Hamstrings: Right NT deg; Left NT deg Maisie Fus test: Right NT deg; Left NT deg  POSTURE: rounded shoulders and forward head  PALPATION: Not TTP to the low back area  LUMBAR ROM:  NT AROM 04/04/23 04/19/23 05/01/23  Flexion WFL min pain  WFL Pain with return   Extension [facility] felt good  [facility] felt good    Right lateral flexion [facility]   WFL   Left lateral flexion Pain on Rt   WFL  Right rotation WFL    Left rotation WFL      (Blank rows = not tested)  LOWER EXTREMITY ROM:    Grossly WNL's   Active  Right eval  Left eval  Hip flexion    Hip extension    Hip abduction    Hip adduction    Hip internal rotation    Hip external rotation    Knee flexion    Knee extension    Ankle dorsiflexion    Ankle plantarflexion    Ankle inversion    Ankle eversion     (Blank rows = not tested)  LOWER EXTREMITY MMT:   Myotome screen negative MMT Right eval Left eval  Hip flexion 4+ 4+  Hip extension  Hip abduction 4+ 4+  Hip adduction    Hip internal rotation    Hip external rotation 4+ 4+  Knee flexion 5 5  Knee extension    Ankle dorsiflexion 5 5  Ankle plantarflexion 5 5  Ankle inversion    Ankle eversion     (Blank rows = not tested)  LUMBAR SPECIAL TESTS:  Straight leg raise test: Negative, Slump test: Negative, and SI Compression/distraction test: Negative  FUNCTIONAL TESTS:  NT  GAIT: Distance walked: 200' Assistive device utilized: None  TREATMENT DATE:  [facility] Adult PT Treatment:                                                DATE: 05/09/23 Therapeutic Exercise: Standing horiz abdct x 20 SKTC PPT  PPT with marching  PPT with GTB clam  PPT to banded bridge GTB LTR Childs pose  Cat camel Bird dog  Side lying hip abduction right , left x  15 each Side lying hip clam Right , left x 15 each Blue TB Therapeutic Activity: Nustep L4 x 6 minutes  Standing Row Blue and ext Green  TB 15 x 2 each  6 inch step up, runners with 1 UE support x 15 each STS 15#  2 x 10, RLE back  Self Care:  Tennis ball massage to address low back muscle tightness and pain  [facility] Adult PT Treatment:                                                DATE: 05/07/23 Therapeutic Exercise: Standing horiz abdct x 20 SKTC PPT  PPT with marching  PPT with GTB clam  PPT to banded bridge GTB LTR Childs pose  Cat camel Bird dog  Side lying hip abduction right , left x  15 each Side lying hip clam Right , left x 15 each Blue TB  Therapeutic Activity: Nustep L4 x 6 minutes  Standing Row Blue and ext  Green  TB 15 x 2 each  6 inch step up, runners with 1 UE support x 15 each STS 15# 2 x 10 ,RLE back                                     PATIENT EDUCATION:  Education details: Eval findings, POC, HEP, self care  Person educated: Patient Education method: Explanation, Demonstration, Tactile cues, Verbal cues, and Handouts Education comprehension: verbalized understanding, returned demonstration, verbal cues required, and tactile cues required  HOME EXERCISE PROGRAM: Access Code: [email or web address] URL: [email or web address] Date: 04/12/2023 Prepared by: Joellyn Rued  Exercises - Supine Pelvic Rocking  - 2 x daily - 7 x weekly - 1 sets - 10 reps - Supine Posterior Pelvic Tilt with Pelvic Floor Contraction  - 2 x daily - 7 x weekly - 1 sets - 10 reps - 3 hold - Supine Bridge  - 2 x daily - 7 x weekly - 1 sets - 10 reps - 3 hold - Cat Cow  - 2 x daily - 7 x weekly - 3 sets - 10 reps - 5-10 hold - Child's Pose Stretch  - 2 x daily -  7 x weekly - 3 sets - 10 reps - 5-10 hold - Standing Shoulder Horizontal Abduction with Resistance  - 1 x daily - 7 x weekly - 2 sets - 10 reps - 5 hold - Shoulder extension with resistance - Neutral  - 1 x daily - 7 x weekly - 2 sets - 10 reps - 5 hold  ASSESSMENT:  CLINICAL IMPRESSION: Pt completed her PT course of care today. Pt has made very good progress with reduction of pain and improved function. Pt is Ind with her HEP and uses it to help manage her low back pain. Pt is in agreement with DC from PT services at this time.  OBJECTIVE IMPAIRMENTS: decreased activity tolerance, difficulty walking, decreased strength, obesity, pain, and pregnancy .   ACTIVITY LIMITATIONS: carrying, lifting, bending, standing, and sleeping  PARTICIPATION LIMITATIONS: meal prep, cleaning, laundry, and community activity  PERSONAL FACTORS: Fitness, Past/current experiences, and 1 comorbidity: High BMI  are also affecting patient's functional outcome.   REHAB POTENTIAL:  Good  CLINICAL DECISION MAKING: Evolving/moderate complexity  EVALUATION COMPLEXITY: Moderate   GOALS:   SHORT TERM GOALS = LTGs  LONG TERM GOALS: Target date: 05/18/23  Pt will be Ind in a final HEP to assist in the management of her low  back pain during pregnancy  Baseline: Initiated Goal status: 05/09/23-MET  2.  Pt will report overall improvement in low back pain of 50% or greater for improved ability to function and QOL Baseline:1-8/10  05/09/23: 90% improvement Goal status: MET  3.  Pt will voice understanding of measures to assist in the management of her low back pain Baseline:  05/09/23:HEP, tennis ball Goal status: MET  PLAN:  PT FREQUENCY: 2x/week  PT DURATION: 6 weeks  PLANNED INTERVENTIONS: 97164- PT Re-evaluation, 97110-Therapeutic exercises, 97530- Therapeutic activity, 97535- Self Care, 25366- Manual therapy, Patient/Family education, Taping, Cryotherapy, and Moist heat.  PLAN FOR NEXT SESSION: discharge to HEP next visit  PHYSICAL THERAPY DISCHARGE SUMMARY  Visits from Start of Care: 12  Current functional level related to goals / functional outcomes: See clinical impression and PT goals    Remaining deficits: See clinical impression and PT goals    Education / Equipment: HEP. Pt Ed   Patient agrees to discharge. Patient goals were met. Patient is being discharged due to meeting the stated rehab goals.   Aarianna Hoadley MS, PT 05/09/23 3:00 PM

## 2023-05-08 NOTE — Patient Instructions (Addendum)
 Allergic Rhinitis Pregnancy, Second Trimester  - Positive skin test 10/2022: trees, grasses, weeds, dust mites, cats, cockroach.  - Use nasal saline rinses before nose sprays such as with Neilmed Sinus Rinse.  Use distilled water.   - Use Zyrtec 10 mg daily as needed for runny nose, sneezing, itchy watery eyes.  - If symptoms worsen, okay to use Flonase 1 spray each nostril daily as needed. - Avoid Azelastine.  - Consider allergy shots as long term control of your symptoms by teaching your immune system to be more tolerant of your allergy triggers.   Oral Pruritus-Pollen Food Allergy Syndrome: - Negative SPT 10/2022 to common allergenic foods.  - These symptoms are typically not life-threatening and are because of a cross reaction between a pollen you are allergic to, and to a protein in specific foods (such as fresh fruits, vegetables, and nuts). - If you can eat these things and tolerate the symptoms, it is fine to continue to do so.  If not, you may avoid these fresh fruits and vegetables.   - Heating these foods, buying them canned, and peeling these foods should allow them to be consumed without symptoms or with less symptoms.

## 2023-05-08 NOTE — Progress Notes (Signed)
   FOLLOW UP Date of Service/Encounter:  05/08/23   Subjective:  Melissa Gilmore (DOB: 1995/02/29) is a 28 y.o. female who [facility] for follow up for PFAS, hx of urticaria, allergic rhinitis.   History obtained from: chart review and patient. Last seen on 12/13/2022 with me and at the time, hives were doing well, allergies were better with Flonase and Zyrtec also.   Reports being pregnant, currently in 2nd trimester.  Has come off all allergy medications and doing fine without much congestion, drainage, runny nose, sneezing, itchy watery eyes.   No issues with hives or mouth itching with eating.    Past Medical History: Past Medical History:  Diagnosis Date   Acute cholecystitis with chronic cholecystitis 07/16/2021   Acute lumbar back pain 03/18/2022   Anxiety    Childhood asthma    Depression    Major Depression disorder   Diabetes mellitus without complication [facility])    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HCC)    Dyslipidemia 07/17/2021   Headache    Migraines   History of abnormal uterine bleeding 07/17/2021   Schizophrenia (HCC)    Urticaria     Objective:  BP 128/70 (BP Location: Right Arm, Patient Position: Sitting, Cuff Size: Large)   Pulse 82   Temp 98 F (36.7 C) (Temporal)   Resp 16   Ht 5\' 4"  (1.626 m)   Wt 242 lb 11.2 oz (110.1 kg)   LMP 11/12/2022 (Exact Date)   SpO2 98%   BMI 41.66 kg/m  Body mass index is 41.66 kg/m. Physical Exam: GEN: alert, well developed HEENT: clear conjunctiva, nose with mild inferior turbinate hypertrophy, pink nasal mucosa, clear rhinorrhea, + cobblestoning HEART: regular rate and rhythm, no murmur LUNGS: clear to auscultation bilaterally, no coughing, unlabored respiration SKIN: no rashes or lesions  Assessment:   1. Seasonal and perennial allergic rhinitis   2. [redacted] weeks gestation of pregnancy   3. Pollen-food allergy, subsequent encounter     Plan/Recommendations:   Allergic Rhinitis Pregnancy, Second Trimester  - Positive skin test 10/2022: trees, grasses, weeds, dust mites, cats, cockroach.  - Use nasal saline rinses before nose sprays such as with Neilmed Sinus Rinse.  Use distilled water.   - Use Zyrtec 10 mg daily as needed for runny nose, sneezing, itchy watery eyes.  - If symptoms worsen, okay to use Flonase 1 spray each nostril daily as needed. - Avoid Azelastine.  - Consider allergy shots as long term control of your symptoms by teaching your immune system to be more tolerant of your allergy triggers.   Oral Pruritus-Pollen Food Allergy Syndrome: - Negative SPT 10/2022 to common allergenic foods.  - These symptoms are typically not life-threatening and are because of a cross reaction between a pollen you are allergic to, and to a protein in specific foods (such as fresh fruits, vegetables, and nuts). - If you can eat these things and tolerate the symptoms, it is fine to continue to do so.  If not, you may avoid these fresh fruits and vegetables.   - Heating these foods, buying them canned, and peeling these foods should allow them to be consumed without symptoms or with less symptoms.   Return in about 1 year (around 05/07/2024).  Alesia Morin, MD [facility]

## 2023-05-09 ENCOUNTER — Ambulatory Visit: Payer: MEDICAID | Attending: Neurological Surgery

## 2023-05-09 DIAGNOSIS — M6281 Muscle weakness (generalized): Secondary | ICD-10-CM | POA: Diagnosis present

## 2023-05-09 DIAGNOSIS — M5441 Lumbago with sciatica, right side: Secondary | ICD-10-CM | POA: Insufficient documentation

## 2023-05-09 DIAGNOSIS — R262 Difficulty in walking, not elsewhere classified: Secondary | ICD-10-CM | POA: Diagnosis present

## 2023-05-09 DIAGNOSIS — G8929 Other chronic pain: Secondary | ICD-10-CM | POA: Diagnosis present

## 2023-05-11 ENCOUNTER — Ambulatory Visit: Payer: Self-pay

## 2023-05-15 ENCOUNTER — Other Ambulatory Visit: Payer: Self-pay

## 2023-05-15 ENCOUNTER — Ambulatory Visit: Payer: MEDICAID | Admitting: Obstetrics and Gynecology

## 2023-05-15 ENCOUNTER — Encounter: Payer: Self-pay | Admitting: Obstetrics and Gynecology

## 2023-05-15 VITALS — BP 101/61 | HR 93 | Wt 243.7 lb

## 2023-05-15 DIAGNOSIS — O26892 Other specified pregnancy related conditions, second trimester: Secondary | ICD-10-CM

## 2023-05-15 DIAGNOSIS — Z3A26 26 weeks gestation of pregnancy: Secondary | ICD-10-CM

## 2023-05-15 DIAGNOSIS — O280 Abnormal hematological finding on antenatal screening of mother: Secondary | ICD-10-CM

## 2023-05-15 DIAGNOSIS — O24312 Unspecified pre-existing diabetes mellitus in pregnancy, second trimester: Secondary | ICD-10-CM | POA: Diagnosis not present

## 2023-05-15 DIAGNOSIS — O0992 Supervision of high risk pregnancy, unspecified, second trimester: Secondary | ICD-10-CM

## 2023-05-15 DIAGNOSIS — O365920 Maternal care for other known or suspected poor fetal growth, second trimester, not applicable or unspecified: Secondary | ICD-10-CM

## 2023-05-15 DIAGNOSIS — Z8279 Family history of other congenital malformations, deformations and chromosomal abnormalities: Secondary | ICD-10-CM

## 2023-05-15 DIAGNOSIS — O365990 Maternal care for other known or suspected poor fetal growth, unspecified trimester, not applicable or unspecified: Secondary | ICD-10-CM | POA: Insufficient documentation

## 2023-05-15 DIAGNOSIS — E1165 Type 2 diabetes mellitus with hyperglycemia: Secondary | ICD-10-CM

## 2023-05-15 DIAGNOSIS — R109 Unspecified abdominal pain: Secondary | ICD-10-CM

## 2023-05-15 DIAGNOSIS — O24112 Pre-existing diabetes mellitus, type 2, in pregnancy, second trimester: Secondary | ICD-10-CM | POA: Diagnosis not present

## 2023-05-15 DIAGNOSIS — O0990 Supervision of high risk pregnancy, unspecified, unspecified trimester: Secondary | ICD-10-CM

## 2023-05-15 DIAGNOSIS — Z794 Long term (current) use of insulin: Secondary | ICD-10-CM

## 2023-05-15 DIAGNOSIS — O26899 Other specified pregnancy related conditions, unspecified trimester: Secondary | ICD-10-CM

## 2023-05-15 DIAGNOSIS — Z6841 Body Mass Index (BMI) 40.0 and over, adult: Secondary | ICD-10-CM

## 2023-05-15 LAB — POCT URINALYSIS DIP (DEVICE)
Bilirubin Urine: NEGATIVE
Glucose, UA: NEGATIVE mg/dL
Hgb urine dipstick: NEGATIVE
Ketones, ur: NEGATIVE mg/dL
Leukocytes,Ua: NEGATIVE
Nitrite: NEGATIVE
Protein, ur: NEGATIVE mg/dL
Specific Gravity, Urine: 1.02 (ref 1.005–1.030)
Urobilinogen, UA: 0.2 mg/dL (ref 0.0–1.0)
pH: 7 (ref 5.0–8.0)

## 2023-05-15 MED ORDER — FREESTYLE LIBRE 3 SENSOR MISC: 6 refills | Status: DC

## 2023-05-15 MED ORDER — NOVOLOG FLEXPEN 100 UNIT/ML ~~LOC~~ SOPN: 3.0000 [IU] | PEN_INJECTOR | Freq: Three times a day (TID) | SUBCUTANEOUS | 6 refills | Status: DC

## 2023-05-15 MED ORDER — FAMOTIDINE 20 MG PO TABS: 20.0000 mg | ORAL_TABLET | Freq: Two times a day (BID) | ORAL | 3 refills | Status: DC

## 2023-05-15 MED ORDER — LANTUS SOLOSTAR 100 UNIT/ML ~~LOC~~ SOPN: 39.0000 [IU] | PEN_INJECTOR | Freq: Every day | SUBCUTANEOUS | Status: DC

## 2023-05-15 NOTE — Progress Notes (Unsigned)
 Melissa Gilmore

## 2023-05-16 ENCOUNTER — Encounter: Payer: Self-pay | Admitting: Obstetrics and Gynecology

## 2023-05-17 LAB — URINE CULTURE, OB REFLEX

## 2023-05-17 LAB — CULTURE, OB URINE

## 2023-05-22 ENCOUNTER — Ambulatory Visit: Payer: MEDICAID | Admitting: Obstetrics and Gynecology

## 2023-05-22 ENCOUNTER — Encounter: Payer: Self-pay | Admitting: Family Medicine

## 2023-05-22 ENCOUNTER — Other Ambulatory Visit: Payer: Self-pay

## 2023-05-22 ENCOUNTER — Encounter: Payer: Self-pay | Admitting: Obstetrics and Gynecology

## 2023-05-22 VITALS — BP 126/78 | HR 88 | Wt 242.0 lb

## 2023-05-22 DIAGNOSIS — O24112 Pre-existing diabetes mellitus, type 2, in pregnancy, second trimester: Secondary | ICD-10-CM | POA: Diagnosis not present

## 2023-05-22 DIAGNOSIS — O365910 Maternal care for other known or suspected poor fetal growth, first trimester, not applicable or unspecified: Secondary | ICD-10-CM

## 2023-05-22 DIAGNOSIS — Z6841 Body Mass Index (BMI) 40.0 and over, adult: Secondary | ICD-10-CM

## 2023-05-22 DIAGNOSIS — O0990 Supervision of high risk pregnancy, unspecified, unspecified trimester: Secondary | ICD-10-CM

## 2023-05-22 DIAGNOSIS — O280 Abnormal hematological finding on antenatal screening of mother: Secondary | ICD-10-CM

## 2023-05-22 DIAGNOSIS — O0992 Supervision of high risk pregnancy, unspecified, second trimester: Secondary | ICD-10-CM | POA: Diagnosis not present

## 2023-05-22 DIAGNOSIS — O365921 Maternal care for other known or suspected poor fetal growth, second trimester, fetus 1: Secondary | ICD-10-CM

## 2023-05-22 DIAGNOSIS — H9201 Otalgia, right ear: Secondary | ICD-10-CM

## 2023-05-22 DIAGNOSIS — Z3A27 27 weeks gestation of pregnancy: Secondary | ICD-10-CM

## 2023-05-22 DIAGNOSIS — O24113 Pre-existing diabetes mellitus, type 2, in pregnancy, third trimester: Secondary | ICD-10-CM

## 2023-05-22 MED ORDER — NOVOLOG FLEXPEN 100 UNIT/ML ~~LOC~~ SOPN: 3.0000 [IU] | PEN_INJECTOR | Freq: Three times a day (TID) | SUBCUTANEOUS | 6 refills | Status: DC

## 2023-05-22 NOTE — Progress Notes (Signed)
   PRENATAL VISIT NOTE  Subjective:  Melissa Gilmore is a 28 y.o. G1P0000 at [redacted]w[redacted]d being seen today for ongoing prenatal care.  She is currently monitored for the following issues for this high-risk pregnancy and has 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HCC); Schizophrenia (HCC); Depression; Insulin-requiring or dependent type II diabetes mellitus (HCC); Lumbar radiculopathy; Supervision of high risk pregnancy, antepartum; Family history of spina bifida; Abnormal antenatal AFP screen; Pre-existing type 2 diabetes mellitus in pregnancy; BMI 40.0-44.9, adult (HCC); and Poor fetal growth affecting management of mother on their problem list.  Patient reports no complaints.  Contractions: Not present. Vag. Bleeding: None.  Movement: Present. Denies leaking of fluid.   The following portions of the patient's history were reviewed and updated as appropriate: allergies, current medications, past family history, past medical history, past social history, past surgical history and problem list.   Objective:   Vitals:   05/22/23 0949  BP: 126/78  Pulse: 88  Weight: 242 lb (109.8 kg)    Fetal Status: Fetal Heart Rate (bpm): 140   Movement: Present     General:  Alert, oriented and cooperative. Patient is in no acute distress.  Skin: Skin is warm and dry. No rash noted.   Cardiovascular: Normal heart rate noted  Respiratory: Normal respiratory effort, no problems with respiration noted  Abdomen: Soft, gravid, appropriate for gestational age.  Pain/Pressure: Absent     Pelvic: Cervical exam deferred        Extremities: Normal range of motion.  Edema: None  Mental Status: Normal mood and affect. Normal behavior. Normal judgment and thought content.   Assessment and Plan:  Pregnancy: G1P0000 at [redacted]w[redacted]d  1. Pre-existing type 2 diabetes mellitus during pregnancy in third trimester (Primary) Lantus 39 units nightly Novolog 3/3/3, after dinner sugars remain generally elevated, has log on  phone Increase dinner novolog to 5 units  2. Supervision of high risk pregnancy, antepartum Last growth 38th%tile Labs today  3. Abnormal antenatal AFP screen  4. BMI 40.0-44.9, adult (HCC)  5. Poor fetal growth affecting management of mother in first trimester, single or unspecified fetus  6. [redacted] weeks gestation of pregnancy   Preterm labor symptoms and general obstetric precautions including but not limited to vaginal bleeding, contractions, leaking of fluid and fetal movement were reviewed in detail with the patient. Please refer to After Visit Summary for other counseling recommendations.   Return in about 1 week (around 05/29/2023) for high OB.  Future Appointments  Date Time Provider Department Center  05/29/2023  1:15 PM [facility] NURSE [facility]  05/29/2023  1:30 PM [facility] US6 [facility]  06/05/2023  3:15 PM Conan Bowens, MD [facility]  06/26/2023  1:15 PM [facility] NURSE [facility]  06/26/2023  1:30 PM [facility] US4 [facility]  07/03/2023  1:00 PM [facility] NURSE [facility]  07/03/2023  1:15 PM [facility] NST [facility]  07/10/2023  1:00 PM [facility] NURSE [facility]  07/10/2023  1:15 PM [facility] NST [facility]  07/17/2023  1:00 PM [facility] NURSE [facility]  07/17/2023  1:15 PM [facility] NST [facility]  07/24/2023  1:15 PM [facility] NURSE [facility]  07/24/2023  1:30 PM [facility] US2 [facility]  05/07/2024  3:30 PM Birder Robson, MD [facility] None    Conan Bowens, MD

## 2023-05-23 LAB — CBC
Hematocrit: 35.7 % (ref 34.0–46.6)
Hemoglobin: 12 g/dL (ref 11.1–15.9)
MCH: 29.8 pg (ref 26.6–33.0)
MCHC: 33.6 g/dL (ref 31.5–35.7)
MCV: 89 fL (ref 79–97)
Platelets: 283 10*3/uL (ref 150–450)
RBC: 4.03 x10E6/uL (ref 3.77–5.28)
RDW: 12.7 % (ref 11.7–15.4)
WBC: 9.9 10*3/uL (ref 3.4–10.8)

## 2023-05-23 LAB — HEMOGLOBIN A1C
Est. average glucose Bld gHb Est-mCnc: 117 mg/dL
Hgb A1c MFr Bld: 5.7 % — ABNORMAL HIGH (ref 4.8–5.6)

## 2023-05-23 LAB — HIV ANTIBODY (ROUTINE TESTING W REFLEX): HIV Screen 4th Generation wRfx: NONREACTIVE

## 2023-05-23 LAB — RPR: RPR Ser Ql: NONREACTIVE

## 2023-05-29 ENCOUNTER — Ambulatory Visit: Payer: MEDICAID

## 2023-05-29 ENCOUNTER — Encounter: Payer: MEDICAID | Admitting: Obstetrics and Gynecology

## 2023-06-05 ENCOUNTER — Ambulatory Visit: Payer: MEDICAID | Admitting: Obstetrics and Gynecology

## 2023-06-05 ENCOUNTER — Other Ambulatory Visit: Payer: Self-pay

## 2023-06-05 ENCOUNTER — Encounter: Payer: Self-pay | Admitting: Obstetrics and Gynecology

## 2023-06-05 VITALS — BP 115/81 | HR 98 | Wt 246.2 lb

## 2023-06-05 DIAGNOSIS — O365930 Maternal care for other known or suspected poor fetal growth, third trimester, not applicable or unspecified: Secondary | ICD-10-CM

## 2023-06-05 DIAGNOSIS — O280 Abnormal hematological finding on antenatal screening of mother: Secondary | ICD-10-CM

## 2023-06-05 DIAGNOSIS — Z8279 Family history of other congenital malformations, deformations and chromosomal abnormalities: Secondary | ICD-10-CM | POA: Diagnosis not present

## 2023-06-05 DIAGNOSIS — Z6841 Body Mass Index (BMI) 40.0 and over, adult: Secondary | ICD-10-CM

## 2023-06-05 DIAGNOSIS — O24113 Pre-existing diabetes mellitus, type 2, in pregnancy, third trimester: Secondary | ICD-10-CM

## 2023-06-05 DIAGNOSIS — O0993 Supervision of high risk pregnancy, unspecified, third trimester: Secondary | ICD-10-CM | POA: Diagnosis not present

## 2023-06-05 DIAGNOSIS — O365910 Maternal care for other known or suspected poor fetal growth, first trimester, not applicable or unspecified: Secondary | ICD-10-CM

## 2023-06-05 DIAGNOSIS — H9203 Otalgia, bilateral: Secondary | ICD-10-CM

## 2023-06-05 DIAGNOSIS — O0990 Supervision of high risk pregnancy, unspecified, unspecified trimester: Secondary | ICD-10-CM

## 2023-06-05 DIAGNOSIS — Z3A29 29 weeks gestation of pregnancy: Secondary | ICD-10-CM

## 2023-06-05 NOTE — Progress Notes (Addendum)
 PRENATAL VISIT NOTE  Subjective:  Melissa Gilmore is a 28 y.o. G1P0000 at [redacted]w[redacted]d being seen today for ongoing prenatal care.  She is currently monitored for the following issues for this high-risk pregnancy and has 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HCC); Schizophrenia (HCC); Depression; Insulin -requiring or dependent type II diabetes mellitus (HCC); Lumbar radiculopathy; Supervision of high risk pregnancy, antepartum; Family history of spina bifida; Abnormal antenatal AFP screen; Pre-existing type 2 diabetes mellitus in pregnancy; BMI 40.0-44.9, adult (HCC); and Poor fetal growth affecting management of mother on their problem list.  Patient reports  she is not feeling well, thinks her ear infection is worse and now having pain in both ears. She called [facility] and was told they are not taking new patients, called her family doctor and was told she would not be seen for ear infection there. Pain is to the point that she is feeling disoriented occasionally .  Contractions: Not present. Vag. Bleeding: None.  Movement: Present. Denies leaking of fluid.   The following portions of the patient's history were reviewed and updated as appropriate: allergies, current medications, past family history, past medical history, past social history, past surgical history and problem list.   Objective:   Vitals:   06/05/23 1531  BP: 115/81  Pulse: 98  Weight: 246 lb 3.2 oz (111.7 kg)    Fetal Status: Fetal Heart Rate (bpm): 129   Movement: Present     General:  Alert, oriented and cooperative. Patient is in no acute distress.  Skin: Skin is warm and dry. No rash noted.   Cardiovascular: Normal heart rate noted  Respiratory: Normal respiratory effort, no problems with respiration noted  Abdomen: Soft, gravid, appropriate for gestational age.  Pain/Pressure: Present     Pelvic: Cervical exam deferred        Extremities: Normal range of motion.  Edema: Trace  Mental Status: Normal mood and affect.  Normal behavior. Normal judgment and thought content.   Assessment and Plan:  Pregnancy: G1P0000 at [redacted]w[redacted]d  1. Pre-existing type 2 diabetes mellitus during pregnancy in third trimester (Primary) Lantus  39 units nightly Novolog  3/3/5, dinner remains elevated, will plan to increase novolog  to 7 after dinner  2. Supervision of high risk pregnancy, antepartum  3. Family history of spina bifida  4. Abnormal antenatal AFP screen  5. BMI 40.0-44.9, adult (HCC) Cont baby asa  6. Poor fetal growth affecting management of mother in first trimester, single or unspecified fetus  8. Otalgia of both ears Ears examined by Dr. Ilona Malta, no infection noted Likely viral, reviewed stay hydrated and consider antihistamine to dry out of has congestion To hospital with worsening symptoms   Preterm labor symptoms and general obstetric precautions including but not limited to vaginal bleeding, contractions, leaking of fluid and fetal movement were reviewed in detail with the patient. Please refer to After Visit Summary for other counseling recommendations.   Return in about 2 weeks (around 06/19/2023) for high OB.  Future Appointments  Date Time Provider Department Center  06/21/2023 10:00 AM [facility] PROVIDER 1 [facility]  06/21/2023 10:30 AM [facility] US5 [facility]  06/26/2023  1:00 PM [facility] PROVIDER 1 [facility]  06/26/2023  1:30 PM [facility] US4 [facility]  07/03/2023  1:00 PM [facility] PROVIDER 1 [facility]  07/03/2023  1:15 PM [facility] NST [facility]  07/10/2023  1:00 PM [facility] PROVIDER 1 [facility]  07/10/2023  1:15 PM [facility] NST [facility]  07/17/2023  1:00 PM [facility] PROVIDER 1 [facility]  07/17/2023  1:15 PM [facility] NST [facility]  07/24/2023  1:00 PM [facility] PROVIDER 1 [facility]  07/24/2023  1:30 PM [facility] US2 [facility]  05/07/2024  3:30 PM Kandice Orleans, MD [facility] None    Jan Mcgill, MD

## 2023-06-06 ENCOUNTER — Telehealth: Payer: Self-pay

## 2023-06-06 NOTE — Telephone Encounter (Signed)
 Patient called the RN line regarding possibly having a double ear infection and cold. She would like to know what she needs to do. Patient reports that she reached out to PCP and was told her can't be seen until after pregnancy.  Moira Andrews, RN

## 2023-06-07 NOTE — Telephone Encounter (Signed)
 Returned call to patient. She reports she was able to see her PCP and has been treated.

## 2023-06-21 ENCOUNTER — Other Ambulatory Visit: Payer: Self-pay

## 2023-06-21 ENCOUNTER — Other Ambulatory Visit: Payer: Self-pay | Admitting: Family Medicine

## 2023-06-21 ENCOUNTER — Ambulatory Visit: Payer: MEDICAID | Admitting: Obstetrics

## 2023-06-21 ENCOUNTER — Ambulatory Visit: Payer: MEDICAID | Attending: Obstetrics and Gynecology

## 2023-06-21 ENCOUNTER — Ambulatory Visit: Payer: MEDICAID | Admitting: Obstetrics and Gynecology

## 2023-06-21 VITALS — BP 115/85 | HR 80 | Wt 246.0 lb

## 2023-06-21 VITALS — BP 123/81 | HR 97

## 2023-06-21 DIAGNOSIS — Z794 Long term (current) use of insulin: Secondary | ICD-10-CM

## 2023-06-21 DIAGNOSIS — O99213 Obesity complicating pregnancy, third trimester: Secondary | ICD-10-CM

## 2023-06-21 DIAGNOSIS — O24113 Pre-existing diabetes mellitus, type 2, in pregnancy, third trimester: Secondary | ICD-10-CM | POA: Insufficient documentation

## 2023-06-21 DIAGNOSIS — Z361 Encounter for antenatal screening for raised alphafetoprotein level: Secondary | ICD-10-CM

## 2023-06-21 DIAGNOSIS — O365930 Maternal care for other known or suspected poor fetal growth, third trimester, not applicable or unspecified: Secondary | ICD-10-CM | POA: Diagnosis not present

## 2023-06-21 DIAGNOSIS — O0990 Supervision of high risk pregnancy, unspecified, unspecified trimester: Secondary | ICD-10-CM

## 2023-06-21 DIAGNOSIS — E119 Type 2 diabetes mellitus without complications: Secondary | ICD-10-CM

## 2023-06-21 DIAGNOSIS — O280 Abnormal hematological finding on antenatal screening of mother: Secondary | ICD-10-CM | POA: Diagnosis present

## 2023-06-21 DIAGNOSIS — E669 Obesity, unspecified: Secondary | ICD-10-CM

## 2023-06-21 DIAGNOSIS — O24112 Pre-existing diabetes mellitus, type 2, in pregnancy, second trimester: Secondary | ICD-10-CM | POA: Insufficient documentation

## 2023-06-21 DIAGNOSIS — Z8279 Family history of other congenital malformations, deformations and chromosomal abnormalities: Secondary | ICD-10-CM

## 2023-06-21 DIAGNOSIS — Z6841 Body Mass Index (BMI) 40.0 and over, adult: Secondary | ICD-10-CM

## 2023-06-21 DIAGNOSIS — Z3A31 31 weeks gestation of pregnancy: Secondary | ICD-10-CM | POA: Insufficient documentation

## 2023-06-21 DIAGNOSIS — E1165 Type 2 diabetes mellitus with hyperglycemia: Secondary | ICD-10-CM

## 2023-06-21 NOTE — Progress Notes (Signed)
 MFM Consult Note  Melissa Gilmore [facility] is currently at 31 weeks and 4 days.  She has been followed due to maternal obesity with a BMI of 40, pregestational diabetes treated with insulin , and an elevated MSAFP of 2.29 MoM.  She currently has a Dexcom glucose monitor in place and reports that her postprandial dinner glucose levels remain elevated.  On today's exam, the overall EFW of 4 pounds 5 ounces measures at the 65th percentile for her gestational age.    There was normal amniotic fluid noted with a total AFI of 18.78 cm.    A BPP performed today was 8 out of 8.  Due to maternal obesity, pregestational diabetes, and her elevated MSAFP level, we will continue to follow her with weekly fetal testing until delivery.  Should her blood glucose values remain elevated, delivery should be considered at between 37 to 38 weeks.    She will return in 1 week for another BPP.    The patient stated that all of her questions were answered today.  A total of 20 minutes was spent counseling and coordinating the care for this patient.  Greater than 50% of the time was spent in direct face-to-face contact.

## 2023-06-21 NOTE — Progress Notes (Addendum)
 PRENATAL VISIT NOTE  Subjective:  Melissa Gilmore is a 28 y.o. G1P0000 at [redacted]w[redacted]d being seen today for ongoing prenatal care.  She is currently monitored for the following issues for this high-risk pregnancy and has 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HCC); Schizophrenia (HCC); Depression; Insulin -requiring or dependent type II diabetes mellitus (HCC); Lumbar radiculopathy; Supervision of high risk pregnancy, antepartum; Family history of spina bifida; Abnormal antenatal AFP screen; Pre-existing type 2 diabetes mellitus in pregnancy; BMI 40.0-44.9, adult (HCC); and Poor fetal growth affecting management of mother on their problem list.  Patient reports no complaints.  Contractions: Not present. Vag. Bleeding: None.  Movement: Present. Denies leaking of fluid.   The following portions of the patient's history were reviewed and updated as appropriate: allergies, current medications, past family history, past medical history, past social history, past surgical history and problem list.   Objective:    Vitals:   06/21/23 0820  BP: 115/85  Pulse: 80  Weight: 246 lb (111.6 kg)    Fetal Status:  Fetal Heart Rate (bpm): 150   Movement: Present    General: Alert, oriented and cooperative. Patient is in no acute distress.  Skin: Skin is warm and dry. No rash noted.   Cardiovascular: Normal heart rate noted  Respiratory: Normal respiratory effort, no problems with respiration noted  Abdomen: Soft, gravid, appropriate for gestational age.  Pain/Pressure: Absent     Pelvic: Cervical exam deferred        Extremities: Normal range of motion.  Edema: None  Mental Status: Normal mood and affect. Normal behavior. Normal judgment and thought content.   Assessment and Plan:  Pregnancy: G1P0000 at [redacted]w[redacted]d 1. [redacted] weeks gestation of pregnancy (Primary) D/w pt re: birth control next visit  2. Pre-existing type 2 diabetes mellitus during pregnancy in third trimester Confirms on lantus  39 at bedtime  and novolog  pen 3/3/5. After dinner sugars in the 130s-150s but patient taking that dose after dinner. I told her to take it with dinner like with breakfast and lunch. I told her to stay on 5u for now and f/u next visit  Follow up growth u/s today and to start weekly testing thereafter  D/w her tentative delivery plan is 37/0-38/6 and will be based on subsequent scans Fetal echo negative 3/18  3. Insulin -requiring or dependent type II diabetes mellitus (HCC)  4. Poor fetal growth affecting management of mother in third trimester, single or unspecified fetus Resolved. Follow up today 3/18: 32%, 561g, ac 20%, afi wnl   5. BMI 40.0-44.9, adult (HCC) Weight stable  6. Abnormal antenatal AFP screen Low risk panorama.   Preterm labor symptoms and general obstetric precautions including but not limited to vaginal bleeding, contractions, leaking of fluid and fetal movement were reviewed in detail with the patient. Please refer to After Visit Summary for other counseling recommendations.   Return in 12 days (on 07/03/2023) for in person, md visit, high risk ob.  Future Appointments  Date Time Provider Department Center  06/21/2023 10:00 AM [facility] PROVIDER 1 [facility]  06/21/2023 10:30 AM [facility] US5 [facility]  06/26/2023  1:00 PM [facility] PROVIDER 1 [facility]  06/26/2023  1:30 PM [facility] US4 [facility]  07/03/2023  1:15 PM [facility] NST [facility]  07/10/2023  1:15 PM [facility] NST [facility]  07/17/2023  1:15 PM [facility] NST [facility]  07/24/2023  1:00 PM [facility] PROVIDER 1 [facility]  07/24/2023  1:30 PM [facility] US2 [facility]  05/07/2024  3:30 PM Lydia Sams, Arthor Billet  P, MD [facility] None    Raynell Caller, MD

## 2023-06-25 ENCOUNTER — Telehealth: Payer: Self-pay | Admitting: *Deleted

## 2023-06-25 ENCOUNTER — Other Ambulatory Visit: Payer: Self-pay | Admitting: Family Medicine

## 2023-06-25 ENCOUNTER — Encounter: Payer: Self-pay | Admitting: Obstetrics and Gynecology

## 2023-06-25 DIAGNOSIS — E1165 Type 2 diabetes mellitus with hyperglycemia: Secondary | ICD-10-CM

## 2023-06-25 NOTE — Telephone Encounter (Signed)
 Patient left a voice message this afternoon she would like to speak to a nurse. She states she starting having pains and not sure if is braxton hicks on Saturday. She states when she has them she has urge to poop. I called [facility] and she reports it feels like bad period cramps in belly and back. States she is not constipated and is having normal bowel movements. She reports good fetal movement, denies leakage or bleeding. She states the cramping was about 1 every hour and a half on Saturday until today, and then about every hour today. I instructed her if contractions get more often or if pressure increases or if she starts leaking, bleeding or decreased fetal movement to go to hospital for evaluation. She voices understanding. Alejandra Hurst

## 2023-06-26 ENCOUNTER — Encounter: Payer: Self-pay | Admitting: *Deleted

## 2023-06-26 ENCOUNTER — Ambulatory Visit (HOSPITAL_BASED_OUTPATIENT_CLINIC_OR_DEPARTMENT_OTHER): Payer: MEDICAID | Admitting: Obstetrics and Gynecology

## 2023-06-26 ENCOUNTER — Ambulatory Visit: Payer: MEDICAID | Attending: Maternal & Fetal Medicine

## 2023-06-26 ENCOUNTER — Ambulatory Visit: Payer: MEDICAID

## 2023-06-26 VITALS — BP 122/81 | HR 93

## 2023-06-26 DIAGNOSIS — O99213 Obesity complicating pregnancy, third trimester: Secondary | ICD-10-CM | POA: Diagnosis not present

## 2023-06-26 DIAGNOSIS — Z3A32 32 weeks gestation of pregnancy: Secondary | ICD-10-CM

## 2023-06-26 DIAGNOSIS — O280 Abnormal hematological finding on antenatal screening of mother: Secondary | ICD-10-CM | POA: Insufficient documentation

## 2023-06-26 DIAGNOSIS — O0990 Supervision of high risk pregnancy, unspecified, unspecified trimester: Secondary | ICD-10-CM

## 2023-06-26 DIAGNOSIS — O24112 Pre-existing diabetes mellitus, type 2, in pregnancy, second trimester: Secondary | ICD-10-CM

## 2023-06-26 DIAGNOSIS — O24113 Pre-existing diabetes mellitus, type 2, in pregnancy, third trimester: Secondary | ICD-10-CM | POA: Insufficient documentation

## 2023-06-26 DIAGNOSIS — E119 Type 2 diabetes mellitus without complications: Secondary | ICD-10-CM

## 2023-06-26 DIAGNOSIS — Z794 Long term (current) use of insulin: Secondary | ICD-10-CM

## 2023-06-26 DIAGNOSIS — O0993 Supervision of high risk pregnancy, unspecified, third trimester: Secondary | ICD-10-CM

## 2023-06-26 DIAGNOSIS — E669 Obesity, unspecified: Secondary | ICD-10-CM

## 2023-06-26 NOTE — Progress Notes (Signed)
 Maternal-Fetal Medicine Consultation Name: Melissa Gilmore MRN: [id number]  G1 P0 at 32w 3d.  Type 2 diabetes.  Patient reports she takes Lantus  insulin  39 units at night and NovoLog  3/3/5 units with meals.  Increased MSAFP (2.29 MoM) at mid-trimester screening.  On ultrasound performed last week, fetal growth is appropriate for gestational age.  Ultrasound Amniotic fluid is normal good fetal activity seen.  Breech presentation.  Antenatal testing is reassuring.  BPP 8/8.  I reassured the patient of the findings.  I reviewed her blood glucose log (performed).  All her fasting and postprandial levels are within normal range except some post dinner values (up to 170s). I recommended that she take NovoLog  to 7 or 9 units with meals and gradually increase till the blood glucose is within normal range.  Recommendations Continue weekly antenatal testing till delivery.   Consultation including face-to-face (more than 50%) counseling 10 minutes.

## 2023-06-27 ENCOUNTER — Inpatient Hospital Stay (HOSPITAL_COMMUNITY)
Admission: AD | Admit: 2023-06-27 | Discharge: 2023-06-27 | Disposition: A | Discharge status: Home / Self Care | Payer: MEDICAID | Attending: Obstetrics and Gynecology | Admitting: Obstetrics and Gynecology

## 2023-06-27 ENCOUNTER — Encounter (HOSPITAL_COMMUNITY): Payer: Self-pay | Admitting: Obstetrics and Gynecology

## 2023-06-27 ENCOUNTER — Other Ambulatory Visit: Payer: Self-pay | Admitting: Family Medicine

## 2023-06-27 ENCOUNTER — Telehealth: Payer: Self-pay | Admitting: Family Medicine

## 2023-06-27 ENCOUNTER — Other Ambulatory Visit: Payer: Self-pay

## 2023-06-27 ENCOUNTER — Ambulatory Visit: Payer: Self-pay

## 2023-06-27 DIAGNOSIS — Z3A32 32 weeks gestation of pregnancy: Secondary | ICD-10-CM | POA: Diagnosis not present

## 2023-06-27 DIAGNOSIS — R52 Pain, unspecified: Secondary | ICD-10-CM | POA: Diagnosis not present

## 2023-06-27 DIAGNOSIS — E1165 Type 2 diabetes mellitus with hyperglycemia: Secondary | ICD-10-CM

## 2023-06-27 DIAGNOSIS — O26893 Other specified pregnancy related conditions, third trimester: Secondary | ICD-10-CM | POA: Diagnosis not present

## 2023-06-27 DIAGNOSIS — O99283 Endocrine, nutritional and metabolic diseases complicating pregnancy, third trimester: Secondary | ICD-10-CM | POA: Insufficient documentation

## 2023-06-27 DIAGNOSIS — Z794 Long term (current) use of insulin: Secondary | ICD-10-CM | POA: Diagnosis not present

## 2023-06-27 DIAGNOSIS — O24113 Pre-existing diabetes mellitus, type 2, in pregnancy, third trimester: Secondary | ICD-10-CM | POA: Diagnosis present

## 2023-06-27 DIAGNOSIS — E876 Hypokalemia: Secondary | ICD-10-CM

## 2023-06-27 LAB — CBC
HCT: 34.5 % — ABNORMAL LOW (ref 36.0–46.0)
Hemoglobin: 11.7 g/dL — ABNORMAL LOW (ref 12.0–15.0)
MCH: 28.5 pg (ref 26.0–34.0)
MCHC: 33.9 g/dL (ref 30.0–36.0)
MCV: 83.9 fL (ref 80.0–100.0)
Platelets: 322 10*3/uL (ref 150–400)
RBC: 4.11 MIL/uL (ref 3.87–5.11)
RDW: 13 % (ref 11.5–15.5)
WBC: 11.5 10*3/uL — ABNORMAL HIGH (ref 4.0–10.5)
nRBC: 0 % (ref 0.0–0.2)

## 2023-06-27 LAB — PROTEIN / CREATININE RATIO, URINE
Creatinine, Urine: 219 mg/dL
Protein Creatinine Ratio: 0.08 mg/mg{creat} (ref 0.00–0.15)
Total Protein, Urine: 17 mg/dL

## 2023-06-27 LAB — URINALYSIS, ROUTINE W REFLEX MICROSCOPIC
Bilirubin Urine: NEGATIVE
Glucose, UA: NEGATIVE mg/dL
Hgb urine dipstick: NEGATIVE
Ketones, ur: NEGATIVE mg/dL
Leukocytes,Ua: NEGATIVE
Nitrite: NEGATIVE
Protein, ur: NEGATIVE mg/dL
Specific Gravity, Urine: 1.021 (ref 1.005–1.030)
pH: 5 (ref 5.0–8.0)

## 2023-06-27 LAB — COMPREHENSIVE METABOLIC PANEL WITH GFR
ALT: 22 U/L (ref 0–44)
AST: 21 U/L (ref 15–41)
Albumin: 2.9 g/dL — ABNORMAL LOW (ref 3.5–5.0)
Alkaline Phosphatase: 117 U/L (ref 38–126)
Anion gap: 10 (ref 5–15)
BUN: 6 mg/dL (ref 6–20)
CO2: 18 mmol/L — ABNORMAL LOW (ref 22–32)
Calcium: 8.5 mg/dL — ABNORMAL LOW (ref 8.9–10.3)
Chloride: 105 mmol/L (ref 98–111)
Creatinine, Ser: 0.65 mg/dL (ref 0.44–1.00)
GFR, Estimated: >60 mL/min (ref >60)
Glucose, Bld: 112 mg/dL — ABNORMAL HIGH (ref 70–99)
Potassium: 3.3 mmol/L — ABNORMAL LOW (ref 3.5–5.1)
Sodium: 133 mmol/L — ABNORMAL LOW (ref 135–145)
Total Bilirubin: 0.4 mg/dL (ref 0.0–1.2)
Total Protein: 6.6 g/dL (ref 6.5–8.1)

## 2023-06-27 LAB — RESP PANEL BY RT-PCR (RSV, FLU A&B, COVID)  RVPGX2
Influenza A by PCR: NEGATIVE
Influenza B by PCR: NEGATIVE
Resp Syncytial Virus by PCR: NEGATIVE
SARS Coronavirus 2 by RT PCR: NEGATIVE

## 2023-06-27 LAB — BRAIN NATRIURETIC PEPTIDE: B Natriuretic Peptide: 12.6 pg/mL (ref 0.0–100.0)

## 2023-06-27 MED ORDER — DIPHENHYDRAMINE HCL 50 MG/ML IJ SOLN
12.5000 mg | Freq: Once | INTRAMUSCULAR | Status: AC
  Administered 2023-06-27: 12.5 mg via INTRAVENOUS
  Filled 2023-06-27: qty 1

## 2023-06-27 MED ORDER — METOCLOPRAMIDE HCL 5 MG/ML IJ SOLN
10.0000 mg | Freq: Once | INTRAMUSCULAR | Status: AC
  Administered 2023-06-27: 10 mg via INTRAVENOUS
  Filled 2023-06-27: qty 2

## 2023-06-27 MED ORDER — SODIUM CHLORIDE 0.9 % IV BOLUS
1000.0000 mL | Freq: Once | INTRAVENOUS | Status: AC
  Administered 2023-06-27: 1000 mL via INTRAVENOUS

## 2023-06-27 MED ORDER — CYCLOBENZAPRINE HCL 5 MG PO TABS
10.0000 mg | ORAL_TABLET | Freq: Once | ORAL | Status: AC
  Administered 2023-06-27: 10 mg via ORAL
  Filled 2023-06-27: qty 2

## 2023-06-27 MED ORDER — POTASSIUM CHLORIDE CRYS ER 20 MEQ PO TBCR: 20.0000 meq | EXTENDED_RELEASE_TABLET | Freq: Every day | ORAL | 0 refills | Status: DC

## 2023-06-27 MED ORDER — POTASSIUM CHLORIDE 10 MEQ/100ML IV SOLN
10.0000 meq | INTRAVENOUS | Status: AC
  Administered 2023-06-27 (×2): 10 meq via INTRAVENOUS
  Filled 2023-06-27 (×2): qty 100

## 2023-06-27 MED ORDER — BD PEN NEEDLE MINI U/F 31G X 5 MM MISC: 3 refills | Status: DC

## 2023-06-27 NOTE — Telephone Encounter (Addendum)
 Copied from CRM [id number]. Topic: Clinical - Red Word Triage >> Jun 27, 2023  2:02 PM Oddis Bench wrote: Red Word that prompted transfer to Nurse Triage: Patient is calling she has been without Insulin  Pen Needle (B-D UF III MINI PEN NEEDLES) 31G X 5 MM MISC since Friday of last week. She is feeling achy all over and has a bad headache. Patient is also pregnant.   Chief Complaint: Medication Refill, Renewal  Symptoms: Body Pain, Headache  Frequency: Acute  Pertinent Negatives: Patient denies chest pain, dyspnea, dizziness, or visual changes.  Disposition: [x] ED /[] Urgent Care (no appt availability in office) / [] Appointment(In office/virtual)/ []  [place] [facility]/ [] Home Care/ [] Refused Recommended Disposition /[] [place] [facility]/ []  Follow-up with PCP Additional Notes: JD is calling in regarding being out of her pen needles, body pains, and headache. Pended medication for refill, due to lack of in office availability and duration of pregnancy, recommended the patient go to the hospital for LD.   Reason for Disposition  [1] Pregnant < 20 weeks AND [2] SEVERE headache (e.g., excruciating) AND [3] not improved after 2 hours of pain medicine  Answer Assessment - Initial Assessment Questions 1. LOCATION: "Where does it hurt?"      Forehead, Temples  2. ONSET: "When did the headache start?" (e.g., minutes, hours or days)      This morning  3. PATTERN: "Does the pain come and go, or has it been constant since it started?"     Constant  4. SEVERITY: "How bad is the pain?" and "What does it keep you from doing?"    - MILD: Doesn't interfere with normal activities    - MODERATE: Interferes with normal activities or awakens from sleep.    - SEVERE: Excruciating pain, unable to do any normal activities.        Moderate  5. RECURRENT SYMPTOM: "Have you ever had headaches before?" If Yes, ask: "When was the last time?" and "What happened that time?"      No  6. CAUSE: "What do you  think is causing the headache?"     Unsure  7. MIGRAINE: "Have you been diagnosed with migraine headaches?" If Yes, ask: "Is this headache similar?"      No  8. HEAD INJURY: "Has there been any recent injury to the head?"      No  9. OTHER SYMPTOMS: "Do you have any other symptoms?" (e.g., abdomen pain, blurred vision, fever, stiff neck; swelling of hands, face, or feet)     Body Aches  10. PREGNANCY: "How many weeks pregnant are you?"       8 Months Pregnant  11. EDD: "What date are you expecting to deliver?"       July 13. 2025  Protocols used: Pregnancy - Headache-A-AH

## 2023-06-27 NOTE — MAU Provider Note (Signed)
 Chief Complaint:  Generalized Body Aches   Event Date/Time   First Provider Initiated Contact with Patient 06/27/23 1649      HPI: Melissa Gilmore is a 28 y.o. G1P0000 at [redacted]w[redacted]d who presents to maternity admissions by EMS with body aches, headache, and reports she has not had her insulin  syringes for 4-5 days.  She denies any n/v, sick contacts, or respiratory symptoms. She reports good fetal movement, denies regular contractions, LOF, vaginal bleeding.  HPI  Past Medical History: Past Medical History:  Diagnosis Date   Acute cholecystitis with chronic cholecystitis 07/16/2021   Acute lumbar back pain 03/18/2022   Anxiety    Childhood asthma    Depression    Major Depression disorder   Diabetes mellitus without complication (HCC)    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HCC)    Dyslipidemia 07/17/2021   Headache    Migraines   History of abnormal uterine bleeding 07/17/2021   Schizophrenia (HCC)    Syncope 03/23/2022   Urticaria     Past obstetric history: OB History  Gravida Para Term Preterm AB Living  1 0 0 0 0 0  SAB IAB Ectopic Multiple Live Births  0 0 0 0 0    # Outcome Date GA Lbr Len/2nd Weight Sex Type Anes PTL Lv  1 Current             Past Surgical History: Past Surgical History:  Procedure Laterality Date   CHOLECYSTECTOMY N/A 07/17/2021   Procedure: SINGLE SITE LAPAROSCOPIC CHOLECYSTECTOMY WITH INTRAOPERATIVE CHOLANGIOGRAM; LIVER BIOPSY;  Surgeon: Candyce Champagne, MD;  Location: [facility];  Service: General;  Laterality: N/A;   IR EPIDUROGRAPHY  03/21/2022   LUMBAR LAMINECTOMY/ DECOMPRESSION WITH MET-RX Right 06/06/2022   Procedure: Right Lumbar three-four Minimally Invasive Surgery Laminectomy/Discectomy;  Surgeon: Cannon Champion, MD;  Location: [facility] OR;  Service: Neurosurgery;  Laterality: Right;   WISDOM TOOTH EXTRACTION      Family History: Family History  Problem Relation Age of Onset   Allergic rhinitis Father    Diabetes Maternal  Grandmother    Stomach cancer Maternal Grandmother     Social History: Social History   Tobacco Use   Smoking status: Never    Passive exposure: Never   Smokeless tobacco: Never  Vaping Use   Vaping status: Never Used  Substance Use Topics   Alcohol use: Not Currently    Comment: Occasional- events   Drug use: Never    Allergies: No Known Allergies  Meds:  Medications Prior to Admission  Medication Sig Dispense Refill Last Dose/Taking   acetaminophen  (TYLENOL ) 650 MG CR tablet Take 650 mg by mouth every 8 (eight) hours as needed for pain.      aspirin EC 81 MG tablet Take 81 mg by mouth daily. Swallow whole.      cetirizine  (ZYRTEC  ALLERGY ) 10 MG tablet Take 1 tablet (10 mg total) by mouth daily as needed for allergies. 30 tablet 11    Continuous Glucose Sensor (FREESTYLE LIBRE 3 SENSOR) MISC Place 1 sensor on the skin every 14 days. Use to check glucose continuously 2 each 6    famotidine  (PEPCID ) 20 MG tablet Take 1 tablet (20 mg total) by mouth 2 (two) times daily. 60 tablet 3    fluticasone  (FLONASE ) 50 MCG/ACT nasal spray Place 1 spray into both nostrils daily as needed for allergies or rhinitis. 16 g 11    insulin  aspart (NOVOLOG  FLEXPEN) 100 UNIT/ML FlexPen Inject 3 Units into the skin 3 (three) times  daily with meals. Inject 3 units at breakfast and lunch, the 5 units with dinner. 15 mL 6    insulin  glargine (LANTUS  SOLOSTAR) 100 UNIT/ML Solostar Pen Inject 39 Units into the skin at bedtime.      Insulin  Pen Needle (B-D UF III MINI PEN NEEDLES) 31G X 5 MM MISC Use as instructed. Inject into the skin once daily 100 each 3    Prenatal 28-0.8 MG TABS Take 1 tablet by mouth daily. 30 tablet 12     ROS:  Review of Systems  Constitutional:  Negative for chills, fatigue and fever.  Eyes:  Negative for visual disturbance.  Respiratory:  Negative for shortness of breath.   Cardiovascular:  Negative for chest pain.  Gastrointestinal:  Negative for abdominal pain, nausea and  vomiting.  Genitourinary:  Negative for difficulty urinating, dysuria, flank pain, pelvic pain, vaginal bleeding, vaginal discharge and vaginal pain.  Musculoskeletal:  Positive for myalgias.  Neurological:  Positive for headaches. Negative for dizziness.  Psychiatric/Behavioral: Negative.       I have reviewed patient's Past Medical Hx, Surgical Hx, Family Hx, Social Hx, medications and allergies.   Physical Exam  Patient Vitals for the past 24 hrs:  BP Temp Temp src Pulse Resp SpO2  06/27/23 2117 (!) 96/51 -- -- 85 -- --  06/27/23 2031 123/86 -- -- 82 -- --  06/27/23 2016 121/72 -- -- 70 -- --  06/27/23 2001 127/78 -- -- 79 -- --  06/27/23 1946 105/69 -- -- 98 -- --  06/27/23 1931 129/77 -- -- 83 -- --  06/27/23 1916 129/82 -- -- 79 -- --  06/27/23 1901 (!) 114/90 -- -- 81 -- --  06/27/23 1750 -- -- -- -- -- 98 %  06/27/23 1745 123/82 -- -- 85 -- 98 %  06/27/23 1740 -- -- -- -- -- 98 %  06/27/23 1735 -- -- -- -- -- 99 %  06/27/23 1731 122/78 -- -- 82 -- --  06/27/23 1730 -- -- -- -- -- 99 %  06/27/23 1725 -- -- -- -- -- 99 %  06/27/23 1720 -- -- -- -- -- 99 %  06/27/23 1715 120/85 -- -- (!) 112 -- 99 %  06/27/23 1712 119/88 -- -- (!) 119 -- --  06/27/23 1655 -- -- -- -- -- 99 %  06/27/23 1650 -- -- -- -- -- 99 %  06/27/23 1646 131/82 -- -- (!) 110 -- --  06/27/23 1645 -- -- -- -- -- 99 %  06/27/23 1640 -- -- -- -- -- 100 %  06/27/23 1635 -- -- -- -- -- 100 %  06/27/23 1631 (!) 141/89 -- -- 83 -- --  06/27/23 1630 -- -- -- -- -- 99 %  06/27/23 1625 -- -- -- -- -- 100 %  06/27/23 1620 -- -- -- -- -- 99 %  06/27/23 1616 137/89 -- -- 83 -- --  06/27/23 1615 -- -- -- -- -- 99 %  06/27/23 1610 -- -- -- -- -- 99 %  06/27/23 1605 -- -- -- -- -- 99 %  06/27/23 1601 (!) 141/85 -- -- 91 -- --  06/27/23 1600 -- -- -- -- -- 99 %  06/27/23 1555 -- -- -- -- -- 96 %  06/27/23 1550 -- -- -- -- -- 96 %  06/27/23 1546 (!) 157/102 -- -- (!) 127 -- --  06/27/23 1545 -- -- -- -- -- 98 %   06/27/23 1541 (!) 149/95 -- -- (!) 125 -- --  06/27/23 1540 (!) 149/95 98.3 F (36.8 C) Oral (!) 122 18 100 %  06/27/23 1537 (!) 158/93 -- -- 99 -- --   Constitutional: Well-developed, well-nourished female in no acute distress.  Cardiovascular: normal rate Respiratory: normal effort GI: Abd soft, non-tender, gravid appropriate for gestational age.  MS: Extremities nontender, no edema, normal ROM Neurologic: Alert and oriented x 4.  GU: Neg CVAT.  PELVIC EXAM: Cervix pink, visually closed, without lesion, scant white creamy discharge, vaginal walls and external genitalia normal Bimanual exam: Cervix 0/long/high, firm, anterior, neg CMT, uterus nontender, nonenlarged, adnexa without tenderness, enlargement, or mass     FHT:  Baseline 130, moderate variability, accelerations present, no decelerations Contractions: irregular, mild to palpation   Labs: Results for orders placed or performed during the hospital encounter of 06/27/23 (from the past 24 hours)  CBC     Status: Abnormal   Collection Time: 06/27/23  3:50 PM  Result Value Ref Range   WBC 11.5 (H) 4.0 - 10.5 K/uL   RBC 4.11 3.87 - 5.11 MIL/uL   Hemoglobin 11.7 (L) 12.0 - 15.0 g/dL   HCT 16.1 (L) 09.6 - 04.5 %   MCV 83.9 80.0 - 100.0 fL   MCH 28.5 26.0 - 34.0 pg   MCHC 33.9 30.0 - 36.0 g/dL   RDW 40.9 81.1 - 91.4 %   Platelets 322 150 - 400 K/uL   nRBC 0.0 0.0 - 0.2 %  Comprehensive metabolic panel     Status: Abnormal   Collection Time: 06/27/23  3:50 PM  Result Value Ref Range   Sodium 133 (L) 135 - 145 mmol/L   Potassium 3.3 (L) 3.5 - 5.1 mmol/L   Chloride 105 98 - 111 mmol/L   CO2 18 (L) 22 - 32 mmol/L   Glucose, Bld 112 (H) 70 - 99 mg/dL   BUN 6 6 - 20 mg/dL   Creatinine, Ser 7.82 0.44 - 1.00 mg/dL   Calcium  8.5 (L) 8.9 - 10.3 mg/dL   Total Protein 6.6 6.5 - 8.1 g/dL   Albumin 2.9 (L) 3.5 - 5.0 g/dL   AST 21 15 - 41 U/L   ALT 22 0 - 44 U/L   Alkaline Phosphatase 117 38 - 126 U/L   Total Bilirubin 0.4 0.0  - 1.2 mg/dL   GFR, Estimated >95 >62 mL/min   Anion gap 10 5 - 15  Brain natriuretic peptide     Status: None   Collection Time: 06/27/23  3:50 PM  Result Value Ref Range   B Natriuretic Peptide 12.6 0.0 - 100.0 pg/mL  Resp panel by RT-PCR (RSV, Flu A&B, Covid) Anterior Nasal Swab     Status: None   Collection Time: 06/27/23  4:35 PM   Specimen: Anterior Nasal Swab  Result Value Ref Range   SARS Coronavirus 2 by RT PCR NEGATIVE NEGATIVE   Influenza A by PCR NEGATIVE NEGATIVE   Influenza B by PCR NEGATIVE NEGATIVE   Resp Syncytial Virus by PCR NEGATIVE NEGATIVE  Protein / creatinine ratio, urine     Status: None   Collection Time: 06/27/23  5:12 PM  Result Value Ref Range   Creatinine, Urine 219 mg/dL   Total Protein, Urine 17 mg/dL   Protein Creatinine Ratio 0.08 0.00 - 0.15 mg/mg[Cre]  Urinalysis, Routine w reflex microscopic -Urine, Clean Catch     Status: Abnormal   Collection Time: 06/27/23  5:12 PM  Result Value Ref Range   Color, Urine YELLOW YELLOW   APPearance HAZY (  A) CLEAR   Specific Gravity, Urine 1.021 1.005 - 1.030   pH 5.0 5.0 - 8.0   Glucose, UA NEGATIVE NEGATIVE mg/dL   Hgb urine dipstick NEGATIVE NEGATIVE   Bilirubin Urine NEGATIVE NEGATIVE   Ketones, ur NEGATIVE NEGATIVE mg/dL   Protein, ur NEGATIVE NEGATIVE mg/dL   Nitrite NEGATIVE NEGATIVE   Leukocytes,Ua NEGATIVE NEGATIVE   O/Positive/-- (01/21 1133)  Imaging:  US  MFM FETAL BPP WO NON STRESS Result Date: 06/26/2023 ----------------------------------------------------------------------  OBSTETRICS REPORT                       (Signed Final 06/26/2023 02:18 pm) ---------------------------------------------------------------------- Patient Info  ID #:       [id number]                          D.O.B.:  1995/02/14 (27 yrs)(F)  Name:       Renella Cart DELA            Visit Date: 06/26/2023 01:13 pm              ROSA ---------------------------------------------------------------------- Performed By  Attending:         Cassandria Clever MD        Ref. Address:     [address]                                                             [place], [place]                                                             [address]  Performed By:     Fredrick Jenkins          [facility]                                     [facility]  Referred By:      [facility] ---------------------------------------------------------------------- Orders  #  Description  Code        Ordered By  1  US  MFM FETAL BPP WO NON               76819.01    [facility]     STRESS ----------------------------------------------------------------------  #  Order #                     Accession #                Episode #  1  [id number]                   [id number]                 [id number] ---------------------------------------------------------------------- Indications  Obesity complicating pregnancy, third          O99.213  trimester  Pre-existing diabetes, type 2, in pregnancy,   O24.113  third trimester  Elevated MSAFP                                 O28.9  Antenatal screening for elevated               Z36.1  alphafetoprotein level (AFP)  Family history of congenital anomaly (Spina    Z82.79  Bifida)  [redacted] weeks gestation of pregnancy                Z3A.32  Encounter for other antenatal screening        Z36.2  follow-up  LR NIPS / Neg Horizon ---------------------------------------------------------------------- Fetal Evaluation  Num Of Fetuses:         1  Fetal Heart Rate(bpm):  157  Cardiac Activity:       Observed  Presentation:           Breech  Placenta:               Anterior  P. Cord Insertion:      Previously seen  Amniotic Fluid  AFI FV:      Within normal limits  AFI Sum(cm)     %Tile       Largest Pocket(cm)  15.74            56          7.13  RUQ(cm)       RLQ(cm)       LUQ(cm)        LLQ(cm)  7.13          0.79          2.19           5.63 ---------------------------------------------------------------------- Biophysical Evaluation  Amniotic F.V:   Pocket => 2 cm             F. Tone:        Observed  F. Movement:    Observed                   Score:          8/8  F. Breathing:   Observed ---------------------------------------------------------------------- OB History  Gravidity:    1 ---------------------------------------------------------------------- Gestational Age  LMP:           32w 2d        Date:  11/12/22                  EDD:   08/19/23  Best:  32w 2d     Det. By:  LMP  (11/12/22)          EDD:   08/19/23 ---------------------------------------------------------------------- Impression  G1 P0 at 32w 3d.  Type 2 diabetes.  Patient reports she takes Lantus  insulin  39  units at night and NovoLog  3/3/5 units with meals.  Increased MSAFP (2.29 MoM) at mid-trimester screening.  On ultrasound performed last week, fetal growth is  appropriate for gestational age.  Ultrasound  Amniotic fluid is normal good fetal activity seen.  Breech  presentation.  Antenatal testing is reassuring.  BPP 8/8.  I reassured the patient of the findings.  I reviewed her blood  glucose log (performed).  All her fasting and postprandial  levels are within normal range except some post dinner  values (up to 170s).  I recommended that she take NovoLog  to 7 or 9 units with  meals and gradually increase till the blood glucose is within  normal range. ---------------------------------------------------------------------- Recommendations  Continue weekly antenatal testing till delivery. ----------------------------------------------------------------------                 Cassandria Clever, MD Electronically Signed Final Report   06/26/2023 02:18 pm ----------------------------------------------------------------------   US  MFM OB FOLLOW UP Result  Date: 06/21/2023 ----------------------------------------------------------------------  OBSTETRICS REPORT                       (Signed Final 06/21/2023 11:24 am) ---------------------------------------------------------------------- Patient Info  ID #:       [id number]                          D.O.B.:  [birth year] (27 yrs)(F)  Name:       Renella Cart DELA            Visit Date: 06/21/2023 09:59 am              ROSA ---------------------------------------------------------------------- Performed By  Attending:        Sal Crass MD         Ref. Address:     [address]                                                             [place], [place]                                                             [address]  Performed By:     Earnest Goad            Location[facility]                                     [facility]  Referred By:      [facility] ---------------------------------------------------------------------- Orders  #  Description                           Code        Ordered By  1  US  MFM OB FOLLOW UP                   [phone]    BURK SCHAIBLE  2  US  MFM FETAL BPP WO NON               76819.01    [facility]     STRESS ----------------------------------------------------------------------  #  Order #                     Accession #                Episode #  1  [id number]                   [id number]                 [id number]  2  [id number]                   [id number]                 [id number] ---------------------------------------------------------------------- Indications  Obesity complicating pregnancy, third          O99.213  trimester  Pre-existing diabetes, type 2, in pregnancy,   O24.113  third trimester  Elevated MSAFP                                 O28.9  Antenatal screening for  elevated               Z36.1  alphafetoprotein level (AFP)  Family history of congenital anomaly (Spina    Z82.79  Bifida)  [redacted] weeks gestation of pregnancy                Z3A.31  LR NIPS / Neg Horizon ---------------------------------------------------------------------- Fetal Evaluation  Num Of Fetuses:         1  Fetal Heart Rate(bpm):  148  Cardiac Activity:       Observed  Presentation:           Cephalic  Placenta:               Anterior  P. Cord Insertion:      Previously seen  Amniotic Fluid  AFI FV:      Within normal limits  AFI Sum(cm)     %Tile       Largest Pocket(cm)  18.78           70          5.62  RUQ(cm)       RLQ(cm)  LUQ(cm)        LLQ(cm)  4.98          5.62          3.46           4.72 ---------------------------------------------------------------------- Biophysical Evaluation  Amniotic F.V:   Within normal limits       F. Tone:        Observed  F. Movement:    Observed                   Score:          8/8  F. Breathing:   Observed ---------------------------------------------------------------------- Biometry  BPD:        80  mm     G. Age:  32w 1d         57  %    CI:        76.17   %    70 - 86                                                          FL/HC:      22.2   %    19.1 - 21.3  HC:      290.5  mm     G. Age:  32w 0d         24  %    HC/AC:      1.04        0.96 - 1.17  AC:      278.6  mm     G. Age:  31w 6d         58  %    FL/BPD:     80.6   %    71 - 87  FL:       64.5  mm     G. Age:  33w 2d         81  %    FL/AC:      23.2   %    20 - 24  HUM:      51.2  mm     G. Age:  30w 0d         17  %  Est. FW:    1956  gm      4 lb 5 oz     65  % ---------------------------------------------------------------------- OB History  Gravidity:    1 ---------------------------------------------------------------------- Gestational Age  LMP:           31w 4d        Date:  11/12/22                  EDD:   08/19/23  U/S Today:     32w 2d                                        EDD:   08/14/23   Best:          31w 4d     Det. By:  LMP  (11/12/22)          EDD:   08/19/23 ---------------------------------------------------------------------- Anatomy  Cranium:  Previously seen        Aortic Arch:            Previously seen  Cavum:                 Previously seen        Ductal Arch:            Previously seen  Ventricles:            Previously seen        Diaphragm:              Appears normal  Choroid Plexus:        Previously seen        Stomach:                Appears normal, left                                                                        sided  Cerebellum:            Previously seen        Abdomen:                Previously seen  Posterior Fossa:       Previously seen        Abdominal Wall:         Previously seen  Face:                  Orbits and profile     Cord Vessels:           Previously seen                         previously seen  Lips:                  Previously seen        Kidneys:                Appear normal  Thoracic:              Previously seen        Bladder:                Appears normal  Heart:                 Previously seen        Spine:                  Previously seen  RVOT:                  Previously seen        Upper Extremities:      Previously seen  LVOT:                  Previously seen        Lower Extremities:      Previously seen ---------------------------------------------------------------------- Cervix Uterus Adnexa  Cervix  Not visualized (advanced GA >24wks) ---------------------------------------------------------------------- Comments  Harles Lied is currently at 31 weeks and 4  days.  She has been followed due to maternal obesity with a  BMI  of 40, pregestational diabetes treated with insulin , and  an elevated MSAFP of 2.29 MoM.  She currently has a Dexcom glucose monitor in place and  reports that her postprandial dinner glucose levels remain  elevated.  On today's exam, the overall EFW of 4 pounds 5 ounces  measures at the  65th percentile for her gestational age.  There was normal amniotic fluid noted with a total AFI of  18.78 cm.  A BPP performed today was 8 out of 8.  Due to maternal obesity, pregestational diabetes, and her  elevated MSAFP level, we will continue to follow her with  weekly fetal testing until delivery.  Should her blood glucose values remain elevated, delivery  should be considered at between 37 to 38 weeks.  She will return in 1 week for another BPP.  The patient stated that all of her questions were answered  today.  A total of 20 minutes was spent counseling and coordinating  the care for this patient.  Greater than 50% of the time was  spent in direct face-to-face contact. ----------------------------------------------------------------------                   Sal Crass, MD Electronically Signed Final Report   06/21/2023 11:24 am ----------------------------------------------------------------------   US  MFM FETAL BPP WO NON STRESS Result Date: 06/21/2023 ----------------------------------------------------------------------  OBSTETRICS REPORT                       (Signed Final 06/21/2023 11:24 am) ---------------------------------------------------------------------- Patient Info  ID #:       [id number]                          D.O.B.:  24-May-1995 (27 yrs)(F)  Name:       Renella Cart DELA            Visit Date: 06/21/2023 09:59 am              ROSA ---------------------------------------------------------------------- Performed By  Attending:        Sal Crass MD         Ref. Address:     [address]                                                             [place], [place]                                                             [address]  Performed By:     Earnest Goad            [facility]  Referred By:      [facility] ---------------------------------------------------------------------- Orders  #  Description                           Code        Ordered By  1  US  MFM OB FOLLOW UP                   [id number]    BURK SCHAIBLE  2  US  MFM FETAL BPP WO NON               76819.01    [facility]     STRESS ----------------------------------------------------------------------  #  Order #                     Accession #                Episode #  1  [id number]                   [id number]                 [id number]  2  [id number]                   [id number]                 [id number] ---------------------------------------------------------------------- Indications  Obesity complicating pregnancy, third          O99.213  trimester  Pre-existing diabetes, type 2, in pregnancy,   O24.113  third trimester  Elevated MSAFP                                 O28.9  Antenatal screening for elevated               Z36.1  alphafetoprotein level (AFP)  Family history of congenital anomaly (Spina    Z82.79  Bifida)  [redacted] weeks gestation of pregnancy                Z3A.31  LR NIPS / Neg Horizon ---------------------------------------------------------------------- Fetal Evaluation  Num Of Fetuses:         1  Fetal Heart Rate(bpm):  148  Cardiac Activity:       Observed  Presentation:           Cephalic  Placenta:               Anterior  P. Cord Insertion:      Previously seen  Amniotic Fluid  AFI FV:      Within normal limits  AFI Sum(cm)     %Tile       Largest Pocket(cm)  18.78           70          5.62  RUQ(cm)       RLQ(cm)  LUQ(cm)        LLQ(cm)  4.98          5.62          3.46           4.72 ---------------------------------------------------------------------- Biophysical Evaluation  Amniotic F.V:   Within normal limits       F. Tone:        Observed  F. Movement:    Observed                   Score:          8/8  F. Breathing:   Observed  ---------------------------------------------------------------------- Biometry  BPD:        80  mm     G. Age:  32w 1d         57  %    CI:        76.17   %    70 - 86                                                          FL/HC:      22.2   %    19.1 - 21.3  HC:      290.5  mm     G. Age:  32w 0d         24  %    HC/AC:      1.04        0.96 - 1.17  AC:      278.6  mm     G. Age:  31w 6d         58  %    FL/BPD:     80.6   %    71 - 87  FL:       64.5  mm     G. Age:  33w 2d         81  %    FL/AC:      23.2   %    20 - 24  HUM:      51.2  mm     G. Age:  30w 0d         17  %  Est. FW:    1956  gm      4 lb 5 oz     65  % ---------------------------------------------------------------------- OB History  Gravidity:    1 ---------------------------------------------------------------------- Gestational Age  LMP:           31w 4d        Date:  11/12/22                  EDD:   08/19/23  U/S Today:     32w 2d                                        EDD:   08/14/23  Best:          31w 4d     Det. By:  LMP  (11/12/22)          EDD:   08/19/23 ---------------------------------------------------------------------- Anatomy  Cranium:  Previously seen        Aortic Arch:            Previously seen  Cavum:                 Previously seen        Ductal Arch:            Previously seen  Ventricles:            Previously seen        Diaphragm:              Appears normal  Choroid Plexus:        Previously seen        Stomach:                Appears normal, left                                                                        sided  Cerebellum:            Previously seen        Abdomen:                Previously seen  Posterior Fossa:       Previously seen        Abdominal Wall:         Previously seen  Face:                  Orbits and profile     Cord Vessels:           Previously seen                         previously seen  Lips:                  Previously seen        Kidneys:                Appear normal   Thoracic:              Previously seen        Bladder:                Appears normal  Heart:                 Previously seen        Spine:                  Previously seen  RVOT:                  Previously seen        Upper Extremities:      Previously seen  LVOT:                  Previously seen        Lower Extremities:      Previously seen ---------------------------------------------------------------------- Cervix Uterus Adnexa  Cervix  Not visualized (advanced GA >24wks) ---------------------------------------------------------------------- Comments  Harles Lied is currently at 31 weeks and 4  days.  She has been followed due to maternal obesity with a  BMI of 40, pregestational diabetes treated with insulin , and  an elevated MSAFP of 2.29 MoM.  She currently has a Dexcom glucose monitor in place and  reports that her postprandial dinner glucose levels remain  elevated.  On today's exam, the overall EFW of 4 pounds 5 ounces  measures at the 65th percentile for her gestational age.  There was normal amniotic fluid noted with a total AFI of  18.78 cm.  A BPP performed today was 8 out of 8.  Due to maternal obesity, pregestational diabetes, and her  elevated MSAFP level, we will continue to follow her with  weekly fetal testing until delivery.  Should her blood glucose values remain elevated, delivery  should be considered at between 37 to 38 weeks.  She will return in 1 week for another BPP.  The patient stated that all of her questions were answered  today.  A total of 20 minutes was spent counseling and coordinating  the care for this patient.  Greater than 50% of the time was  spent in direct face-to-face contact. ----------------------------------------------------------------------                   Sal Crass, MD Electronically Signed Final Report   06/21/2023 11:24 am ----------------------------------------------------------------------    MAU Course/MDM: Orders Placed This Encounter   Procedures   Resp panel by RT-PCR (RSV, Flu A&B, Covid) Anterior Nasal Swab   CBC   Comprehensive metabolic panel   Brain natriuretic peptide   Protein / creatinine ratio, urine   Urinalysis, Routine w reflex microscopic -Urine, Clean Catch   Airborne and Contact precautions    Meds ordered this encounter  Medications   sodium chloride  0.9 % bolus 1,000 mL   cyclobenzaprine  (FLEXERIL ) tablet 10 mg   potassium chloride  10 mEq in 100 mL IVPB   sodium chloride  0.9 % bolus 1,000 mL   diphenhydrAMINE  (BENADRYL ) injection 12.5 mg   metoCLOPramide  (REGLAN ) injection 10 mg     NST reviewed and reactive Flexeril  given without resolution of h/a No evidence of DKA with normal anion gap, glucose values mild range to normal, potassium 3.3  Potassium replacement 10 meqs given IV, along with Reglan  and Benadryl  Headache resolved D/C home with new Rx for insulin  syringes, Rx for KDur 20 meq x 3 days, return to MAU as needed for worsening symptoms   Assessment: 1. Body aches   2. Hypokalemia   3. Pregnancy with type 2 diabetes mellitus in third trimester   4. [redacted] weeks gestation of pregnancy     Plan: Discharge home Labor precautions and fetal kick counts   Arlester Bence Certified Nurse-Midwife 06/27/2023 9:27 PM

## 2023-06-27 NOTE — MAU Note (Signed)
.  Melissa Gilmore is a 28 y.o. at [redacted]w[redacted]d here in MAU reporting: body aches and headache since yesterday. She is currently a type II diabetic and ran out of her insulin  needles on Friday. She states she found 1 needled and used it yesterday. She denies vaginal bleeding or LOF. Endorses +FM.    Onset of complaint: 06/26/2023 Pain score: 10/10 Vitals:   06/27/23 1540  BP: (!) 149/95  Pulse: (!) 122  Resp: 18  Temp: 98.3 F (36.8 C)  SpO2: 100%     FHT: 148  Lab orders placed from triage: UA

## 2023-06-27 NOTE — Telephone Encounter (Signed)
 Copied from CRM [id number]. Topic: Clinical - Prescription Issue >> Jun 27, 2023  1:59 PM Oddis Bench wrote : Reason for CRM: Patient is calling to get asst with Insulin  Pen Needle (B-D UF III MINI PEN NEEDLES) 31G X 5 MM MISC, she is stating that she put in request last week and it shows that the there was request for 05/19 and it is still pending she has not take insulin  since Friday of last week.

## 2023-06-28 ENCOUNTER — Telehealth: Payer: Self-pay | Admitting: *Deleted

## 2023-06-28 NOTE — Telephone Encounter (Signed)
 Patient was called and informed that she would need to get her DM2 refills from OBGYN until after the baby is born. Patient advise that here OBGYN need to watch her and baby closely at this time.

## 2023-06-29 ENCOUNTER — Telehealth: Payer: Self-pay

## 2023-06-29 ENCOUNTER — Other Ambulatory Visit: Payer: Self-pay | Admitting: Lactation Services

## 2023-06-29 DIAGNOSIS — E1165 Type 2 diabetes mellitus with hyperglycemia: Secondary | ICD-10-CM

## 2023-06-29 MED ORDER — BD PEN NEEDLE MINI U/F 31G X 5 MM MISC: 3 refills | Status: DC

## 2023-06-29 NOTE — Telephone Encounter (Signed)
 Pt called and stated she is unable to get her insulin  needles refilled due to insurance refusing to cover until two weeks from now.  Pt states prescription states use needle once per day but she is using 4 per day.   Called pt to address prescription concern. RN spoke with [facility] pharmacist to update prescription to inject QID.  Advised pt that new prescription should be able to be picked up.  Carolynne Citron, RN

## 2023-06-29 NOTE — Progress Notes (Signed)
 Reordered Insulin  pen needles as original printed vs transmitting to Pharmacy. Rx corrected and sent to Pharmacy.

## 2023-07-03 ENCOUNTER — Ambulatory Visit: Payer: MEDICAID | Attending: Maternal & Fetal Medicine

## 2023-07-03 ENCOUNTER — Ambulatory Visit: Payer: MEDICAID

## 2023-07-03 ENCOUNTER — Other Ambulatory Visit: Payer: MEDICAID

## 2023-07-03 DIAGNOSIS — Z3A33 33 weeks gestation of pregnancy: Secondary | ICD-10-CM

## 2023-07-03 DIAGNOSIS — O24113 Pre-existing diabetes mellitus, type 2, in pregnancy, third trimester: Secondary | ICD-10-CM | POA: Insufficient documentation

## 2023-07-03 DIAGNOSIS — O365930 Maternal care for other known or suspected poor fetal growth, third trimester, not applicable or unspecified: Secondary | ICD-10-CM | POA: Diagnosis present

## 2023-07-03 DIAGNOSIS — Z7984 Long term (current) use of oral hypoglycemic drugs: Secondary | ICD-10-CM | POA: Diagnosis not present

## 2023-07-03 DIAGNOSIS — O24419 Gestational diabetes mellitus in pregnancy, unspecified control: Secondary | ICD-10-CM | POA: Diagnosis not present

## 2023-07-03 NOTE — Procedures (Signed)
 Girl Schissler [facility] 06-01-1995 [redacted]w[redacted]d  Fetus A Non-Stress Test Interpretation for 07/03/23  Indication: Gestational Diabetes medication controlled - NST only  Fetal Heart Rate A Mode: External Baseline Rate (A): 155 bpm Variability: Moderate Accelerations: 15 x 15 Decelerations: None Multiple birth?: No  Uterine Activity Mode: Toco, Palpation Contraction Frequency (min): none noted Resting Tone Palpated: Relaxed  Interpretation (Fetal Testing) Nonstress Test Interpretation: Reactive Comments: Reviewed with Dr. Arcola Kocher

## 2023-07-10 ENCOUNTER — Ambulatory Visit: Payer: MEDICAID

## 2023-07-10 ENCOUNTER — Other Ambulatory Visit: Payer: MEDICAID

## 2023-07-10 ENCOUNTER — Ambulatory Visit: Payer: MEDICAID | Admitting: *Deleted

## 2023-07-10 ENCOUNTER — Ambulatory Visit (INDEPENDENT_AMBULATORY_CARE_PROVIDER_SITE_OTHER): Payer: MEDICAID | Admitting: Obstetrics & Gynecology

## 2023-07-10 ENCOUNTER — Ambulatory Visit: Payer: MEDICAID | Attending: Maternal & Fetal Medicine | Admitting: *Deleted

## 2023-07-10 VITALS — BP 122/87 | HR 103 | Wt 250.4 lb

## 2023-07-10 VITALS — BP 126/83 | HR 106

## 2023-07-10 VITALS — BP 116/60 | HR 82

## 2023-07-10 DIAGNOSIS — O99213 Obesity complicating pregnancy, third trimester: Secondary | ICD-10-CM | POA: Insufficient documentation

## 2023-07-10 DIAGNOSIS — O0990 Supervision of high risk pregnancy, unspecified, unspecified trimester: Secondary | ICD-10-CM

## 2023-07-10 DIAGNOSIS — O24312 Unspecified pre-existing diabetes mellitus in pregnancy, second trimester: Secondary | ICD-10-CM

## 2023-07-10 DIAGNOSIS — O99342 Other mental disorders complicating pregnancy, second trimester: Secondary | ICD-10-CM

## 2023-07-10 DIAGNOSIS — Z3A34 34 weeks gestation of pregnancy: Secondary | ICD-10-CM | POA: Diagnosis not present

## 2023-07-10 DIAGNOSIS — O280 Abnormal hematological finding on antenatal screening of mother: Secondary | ICD-10-CM

## 2023-07-10 DIAGNOSIS — O24113 Pre-existing diabetes mellitus, type 2, in pregnancy, third trimester: Secondary | ICD-10-CM

## 2023-07-10 DIAGNOSIS — Z794 Long term (current) use of insulin: Secondary | ICD-10-CM | POA: Diagnosis not present

## 2023-07-10 DIAGNOSIS — F209 Schizophrenia, unspecified: Secondary | ICD-10-CM

## 2023-07-10 DIAGNOSIS — E119 Type 2 diabetes mellitus without complications: Secondary | ICD-10-CM | POA: Diagnosis not present

## 2023-07-10 MED ORDER — NOVOLOG FLEXPEN 100 UNIT/ML ~~LOC~~ SOPN: 3.0000 [IU] | PEN_INJECTOR | Freq: Three times a day (TID) | SUBCUTANEOUS | 6 refills | Status: DC

## 2023-07-10 MED ORDER — LANTUS SOLOSTAR 100 UNIT/ML ~~LOC~~ SOPN: 40.0000 [IU] | PEN_INJECTOR | Freq: Every day | SUBCUTANEOUS | Status: DC

## 2023-07-10 NOTE — Progress Notes (Signed)
   PRENATAL VISIT NOTE  Subjective:  Melissa Gilmore is a 28 y.o. G1P0000 at [redacted]w[redacted]d being seen today for ongoing prenatal care.  She is currently monitored for the following issues for this high-risk pregnancy and has 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HCC); Schizophrenia (HCC); Depression; Insulin -requiring or dependent type II diabetes mellitus (HCC); Lumbar radiculopathy; Supervision of high risk pregnancy, antepartum; Family history of spina bifida; Abnormal antenatal AFP screen; Pre-existing type 2 diabetes mellitus in pregnancy; BMI 40.0-44.9, adult (HCC); and Poor fetal growth affecting management of mother on their problem list.  Patient reports no complaints.  Contractions: Not present. Vag. Bleeding: None.  Movement: Present. Denies leaking of fluid.   The following portions of the patient's history were reviewed and updated as appropriate: allergies, current medications, past family history, past medical history, past social history, past surgical history and problem list.   Objective:    Vitals:   07/10/23 1409  BP: 122/87  Pulse: (!) 103  Weight: 250 lb 6.4 oz (113.6 kg)    Fetal Status:  Fetal Heart Rate (bpm): 148   Movement: Present    General: Alert, oriented and cooperative. Patient is in no acute distress.  Skin: Skin is warm and dry. No rash noted.   Cardiovascular: Normal heart rate noted  Respiratory: Normal respiratory effort, no problems with respiration noted  Abdomen: Soft, gravid, appropriate for gestational age.  Pain/Pressure: Present     Pelvic:         Extremities: Normal range of motion.  Edema: Trace  Mental Status: Normal mood and affect. Normal behavior. Normal judgment and thought content.   Assessment and Plan:  Pregnancy: G1P0000 at [redacted]w[redacted]d 1. Supervision of high risk pregnancy, antepartum (Primary) K+ needs f/u after given supplement - Basic Metabolic Panel (BMET)  2. Insulin -requiring or dependent type II diabetes mellitus (HCC) Doing  well and made minor adjustment to regimen - insulin  aspart (NOVOLOG  FLEXPEN) 100 UNIT/ML FlexPen; Inject 3 Units into the skin 3 (three) times daily with meals. Inject 5 units at breakfast and lunch, the 10 units with dinner.  Dispense: 15 mL; Refill: 6  3. Pre-existing type 2 diabetes mellitus during pregnancy in third trimester   4. Schizophrenia, unspecified type (HCC)   5. Preexisting diabetes complicating pregnancy, antepartum, second trimester  - insulin  glargine (LANTUS  SOLOSTAR) 100 UNIT/ML Solostar Pen; Inject 40 Units into the skin at bedtime.  Preterm labor symptoms and general obstetric precautions including but not limited to vaginal bleeding, contractions, leaking of fluid and fetal movement were reviewed in detail with the patient. Please refer to After Visit Summary for other counseling recommendations.   Return in about 2 weeks (around 07/24/2023).  Future Appointments  Date Time Provider Department Center  07/10/2023  2:35 PM Tresia Fruit, MD [facility]  07/17/2023  1:00 PM [facility] NURSE [facility]  07/17/2023  1:15 PM [facility] NST [facility]  07/20/2023  8:15 AM Teena Feast, MD [facility]  07/24/2023  1:00 PM [facility] PROVIDER 1 [facility]  07/24/2023  1:30 PM [facility] US2 [facility]  07/27/2023 10:55 AM Teena Feast, MD [facility]  08/03/2023 10:55 AM Raynell Caller, MD [facility]  05/07/2024  3:30 PM Kandice Orleans, MD [facility] None    Onnie Bilis, MD

## 2023-07-10 NOTE — Addendum Note (Signed)
 Addended by: Jaleeya Mcnelly A on: 07/10/2023 12:59 PM   Modules accepted: Orders

## 2023-07-10 NOTE — Procedures (Signed)
 Melissa Gilmore [facility] [birth year] [redacted]w[redacted]d  Fetus A Non-Stress Test Interpretation for 06/03/25NST only  Indication: Diabetes and obese  Fetal Heart Rate A Mode: External Baseline Rate (A): 155 bpm Variability: Moderate Accelerations: 15 x 15 Decelerations: None Multiple birth?: No  Uterine Activity Mode: Toco Contraction Frequency (min): none Resting Tone Palpated: Relaxed  Interpretation (Fetal Testing) Nonstress Test Interpretation: Reactive Comments: Tracing reivewed byDr. Erick Hausen

## 2023-07-11 LAB — BASIC METABOLIC PANEL WITH GFR
BUN/Creatinine Ratio: 10 (ref 9–23)
BUN: 6 mg/dL (ref 6–20)
CO2: 16 mmol/L — ABNORMAL LOW (ref 20–29)
Calcium: 8.6 mg/dL — ABNORMAL LOW (ref 8.7–10.2)
Chloride: 105 mmol/L (ref 96–106)
Creatinine, Ser: 0.58 mg/dL (ref 0.57–1.00)
Glucose: 200 mg/dL — ABNORMAL HIGH (ref 70–99)
Potassium: 4.1 mmol/L (ref 3.5–5.2)
Sodium: 135 mmol/L (ref 134–144)
eGFR: 128 mL/min/{1.73_m2} (ref >59)

## 2023-07-12 ENCOUNTER — Ambulatory Visit: Payer: Self-pay | Admitting: Obstetrics & Gynecology

## 2023-07-17 ENCOUNTER — Ambulatory Visit: Payer: MEDICAID

## 2023-07-17 ENCOUNTER — Other Ambulatory Visit: Payer: MEDICAID

## 2023-07-17 ENCOUNTER — Ambulatory Visit: Payer: MEDICAID | Attending: Maternal & Fetal Medicine | Admitting: *Deleted

## 2023-07-17 ENCOUNTER — Ambulatory Visit: Payer: MEDICAID | Admitting: *Deleted

## 2023-07-17 VITALS — BP 121/75 | HR 87

## 2023-07-17 DIAGNOSIS — O99213 Obesity complicating pregnancy, third trimester: Secondary | ICD-10-CM | POA: Insufficient documentation

## 2023-07-17 DIAGNOSIS — O365930 Maternal care for other known or suspected poor fetal growth, third trimester, not applicable or unspecified: Secondary | ICD-10-CM | POA: Diagnosis not present

## 2023-07-17 DIAGNOSIS — Z3A35 35 weeks gestation of pregnancy: Secondary | ICD-10-CM | POA: Diagnosis not present

## 2023-07-17 DIAGNOSIS — O0990 Supervision of high risk pregnancy, unspecified, unspecified trimester: Secondary | ICD-10-CM

## 2023-07-17 DIAGNOSIS — O280 Abnormal hematological finding on antenatal screening of mother: Secondary | ICD-10-CM

## 2023-07-17 NOTE — Procedures (Addendum)
 Melissa Gilmore [facility] [birth year] [redacted]w[redacted]d  Fetus A Non-Stress Test Interpretation for 07/17/23  Indication: IUGR and class 3 obesity  Fetal Heart Rate A Mode: External Baseline Rate (A): 150 bpm Variability: Moderate Accelerations: 15 x 15 Decelerations: None Multiple birth?: No  Uterine Activity Mode: Toco Contraction Frequency (min): none Resting Tone Palpated: Relaxed  Interpretation (Fetal Testing) Nonstress Test Interpretation: Reactive  A limited ultrasound performed today showed that the fetus was in the breech presentation.    There was normal amniotic fluid noted with a total AFI of 19 cm.    A normal-appearing anterior placenta was noted.    Fetal movements were noted throughout today's ultrasound exam.

## 2023-07-17 NOTE — Procedures (Signed)
 Julanne Schlueter [facility] 29-Mar-1995 [redacted]w[redacted]d  Fetus A Non-Stress Test Interpretation for 07/17/23  Indication: IUGR  Fetal Heart Rate A Mode: External Baseline Rate (A): 150 bpm Variability: Moderate Accelerations: 15 x 15 Decelerations: None Multiple birth?: No  Uterine Activity Mode: Toco Contraction Frequency (min): none Resting Tone Palpated: Relaxed  Interpretation (Fetal Testing) Nonstress Test Interpretation: Reactive

## 2023-07-20 ENCOUNTER — Other Ambulatory Visit (HOSPITAL_COMMUNITY)
Admission: RE | Admit: 2023-07-20 | Discharge: 2023-07-20 | Disposition: A | Discharge status: Home / Self Care | Payer: MEDICAID | Source: Ambulatory Visit | Attending: Family Medicine | Admitting: Family Medicine

## 2023-07-20 ENCOUNTER — Encounter: Payer: Self-pay | Admitting: Family Medicine

## 2023-07-20 ENCOUNTER — Ambulatory Visit: Payer: MEDICAID | Admitting: Family Medicine

## 2023-07-20 VITALS — BP 122/88 | HR 81 | Wt 249.4 lb

## 2023-07-20 DIAGNOSIS — F209 Schizophrenia, unspecified: Secondary | ICD-10-CM

## 2023-07-20 DIAGNOSIS — O365930 Maternal care for other known or suspected poor fetal growth, third trimester, not applicable or unspecified: Secondary | ICD-10-CM

## 2023-07-20 DIAGNOSIS — O24113 Pre-existing diabetes mellitus, type 2, in pregnancy, third trimester: Secondary | ICD-10-CM

## 2023-07-20 DIAGNOSIS — O0990 Supervision of high risk pregnancy, unspecified, unspecified trimester: Secondary | ICD-10-CM

## 2023-07-20 DIAGNOSIS — O99343 Other mental disorders complicating pregnancy, third trimester: Secondary | ICD-10-CM | POA: Diagnosis not present

## 2023-07-20 DIAGNOSIS — Z8279 Family history of other congenital malformations, deformations and chromosomal abnormalities: Secondary | ICD-10-CM

## 2023-07-20 DIAGNOSIS — Z3A35 35 weeks gestation of pregnancy: Secondary | ICD-10-CM

## 2023-07-20 NOTE — Patient Instructions (Signed)

## 2023-07-20 NOTE — Addendum Note (Signed)
 Addended byLennart Quitter on: 07/20/2023 11:36 AM   Modules accepted: Orders

## 2023-07-20 NOTE — Progress Notes (Signed)
   Subjective:  Melissa Gilmore is a 28 y.o. G1P0000 at [redacted]w[redacted]d being seen today for ongoing prenatal care.  She is currently monitored for the following issues for this high-risk pregnancy and has 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HCC); Schizophrenia (HCC); Depression; Insulin -requiring or dependent type II diabetes mellitus (HCC); Lumbar radiculopathy; Supervision of high risk pregnancy, antepartum; Family history of spina bifida; Abnormal antenatal AFP screen; Pre-existing type 2 diabetes mellitus in pregnancy; and BMI 40.0-44.9, adult (HCC) on their problem list.  Patient reports no complaints.  Contractions: Irritability. Vag. Bleeding: None.  Movement: Present. Denies leaking of fluid.   The following portions of the patient's history were reviewed and updated as appropriate: allergies, current medications, past family history, past medical history, past social history, past surgical history and problem list. Problem list updated.  Objective:   Vitals:   07/20/23 0847  BP: 122/88  Pulse: 81  Weight: 249 lb 6.4 oz (113.1 kg)    Fetal Status: Fetal Heart Rate (bpm): 134   Movement: Present     General:  Alert, oriented and cooperative. Patient is in no acute distress.  Skin: Skin is warm and dry. No rash noted.   Cardiovascular: Normal heart rate noted  Respiratory: Normal respiratory effort, no problems with respiration noted  Abdomen: Soft, gravid, appropriate for gestational age. Pain/Pressure: Present (pain in pelvis and lower back)     Pelvic: Vag. Bleeding: None     Cervical exam deferred        Extremities: Normal range of motion.  Edema: Trace (feet)  Mental Status: Normal mood and affect. Normal behavior. Normal judgment and thought content.   Urinalysis:      Assessment and Plan:  Pregnancy: G1P0000 at [redacted]w[redacted]d  1. Supervision of high risk pregnancy, antepartum (Primary) BP and FHR normal Swabs collected today Presentation confirmed vertex on bedside US   2.  Pre-existing type 2 diabetes mellitus during pregnancy in third trimester Current regimen lantus  40u at bedtime, aspart 06/10/08 Log reviewed, fastings well controlled but postprandials remain elevated despite increase in aspart regimen at last visit Current regimen increased, gave patient parameters to go up until controlled Last growth US  06/21/2023, EFW 65%, AC 58%, AFI 18 See below regarding weekly testing Discussed timing of delivery based on MFM recs and medical comorbidities Scheduled for IOL on 08/02/2023 at midnight Form faxed, orders placed  3. Poor fetal growth affecting management of mother in third trimester, single or unspecified fetus On first anatomy scan, normal since then Resolved from PL  4. Family history of spina bifida Abnormal AFP screening, normal anatomy scan Weekly testing recommended but none since 06/26/2023 Has testing scheduled for early next week, will need one further date of testing prior to IOL  5. Schizophrenia, unspecified type (HCC) Unclear why this is on the problem list, not on any antipsychotics and no clear documentation of when or why this diagnosis was made Asked patient about this, she reports AH/VH as a teenager but has had three years without any symptoms and not on any meds at present Has regular psychiatrist and therapist at outside clinic [facility]  Preterm labor symptoms and general obstetric precautions including but not limited to vaginal bleeding, contractions, leaking of fluid and fetal movement were reviewed in detail with the patient. Please refer to After Visit Summary for other counseling recommendations.  Return in 1 week (on 07/27/2023) for [facility], ob visit.   Teena Feast, MD

## 2023-07-23 ENCOUNTER — Encounter (HOSPITAL_COMMUNITY): Payer: Self-pay | Admitting: Obstetrics & Gynecology

## 2023-07-23 ENCOUNTER — Ambulatory Visit: Payer: Self-pay | Admitting: Family Medicine

## 2023-07-23 ENCOUNTER — Inpatient Hospital Stay (HOSPITAL_COMMUNITY)
Admission: AD | Admit: 2023-07-23 | Discharge: 2023-07-23 | Disposition: A | Discharge status: Home / Self Care | Payer: MEDICAID | Attending: Obstetrics & Gynecology | Admitting: Obstetrics & Gynecology

## 2023-07-23 ENCOUNTER — Other Ambulatory Visit: Payer: Self-pay

## 2023-07-23 DIAGNOSIS — O4703 False labor before 37 completed weeks of gestation, third trimester: Secondary | ICD-10-CM | POA: Insufficient documentation

## 2023-07-23 DIAGNOSIS — Z3689 Encounter for other specified antenatal screening: Secondary | ICD-10-CM | POA: Diagnosis not present

## 2023-07-23 DIAGNOSIS — Z3493 Encounter for supervision of normal pregnancy, unspecified, third trimester: Secondary | ICD-10-CM

## 2023-07-23 DIAGNOSIS — O0990 Supervision of high risk pregnancy, unspecified, unspecified trimester: Secondary | ICD-10-CM

## 2023-07-23 DIAGNOSIS — O479 False labor, unspecified: Secondary | ICD-10-CM | POA: Diagnosis not present

## 2023-07-23 DIAGNOSIS — Z3A36 36 weeks gestation of pregnancy: Secondary | ICD-10-CM

## 2023-07-23 DIAGNOSIS — R102 Pelvic and perineal pain: Secondary | ICD-10-CM | POA: Diagnosis present

## 2023-07-23 DIAGNOSIS — O99820 Streptococcus B carrier state complicating pregnancy: Secondary | ICD-10-CM | POA: Insufficient documentation

## 2023-07-23 LAB — CERVICOVAGINAL ANCILLARY ONLY
Chlamydia: NEGATIVE
Comment: NEGATIVE
Comment: NORMAL
Neisseria Gonorrhea: NEGATIVE

## 2023-07-23 LAB — CULTURE, BETA STREP (GROUP B ONLY): Strep Gp B Culture: POSITIVE — AB

## 2023-07-23 MED ORDER — CYCLOBENZAPRINE HCL 10 MG PO TABS: 10.0000 mg | ORAL_TABLET | Freq: Two times a day (BID) | ORAL | 0 refills | Status: DC | PRN

## 2023-07-23 NOTE — Discharge Instructions (Signed)
 Early Labor Tips   Rest when able, especially if your labor starts at night. Side lying positions can feel better than lying on your back  Move around if you aren't able to rest or if your labor starts at night.  Gentle movement, upright or forward-leaning positions can help your baby to be in a good position and put more pressure on your cervix so it can open. Hip circles standing or on a birth ball can help.  Drink water  and eat easily digestible snacks. You may also consider an electrolyte drink (like Gatorade) if you are nauseated or do not have an appetite. Distract yourself with a movie, podcasts, walking outside, music, etc.   The [employer] Circuit is a series of exercises you can do in early labor to encourage your baby into a better position to make progress in labor.  It is available with pictures at [email or web address].com  Pain management at home  Try any relaxation and breathing techniques you have learned in antenatal classes. Have a massage. Your birth partner could help by rubbing your back. Take acetaminophen  (Tylenol ) according to the instructions on the packet - it's safe to take in labour. Have a warm bath or shower. 5.  You may use a TENS unit if you have access to one.

## 2023-07-23 NOTE — MAU Provider Note (Signed)
 None     S [name] is a 28 y.o. G1P0000 pregnant female at [redacted]w[redacted]d who presents to MAU today with complaint of contractions and DFM. She reports DFM throughout the day yesterday, but now that she's here it has improved and is regular and strong.   Receives care at [facility]. Prenatal records reviewed.  Pertinent items noted in HPI and remainder of comprehensive ROS otherwise negative.   O BP 119/81   Pulse (!) 105   Temp 98.4 F (36.9 C) (Oral)   Ht 5' 4 (1.626 m)   Wt 114.8 kg   LMP 11/12/2022 (Exact Date)   SpO2 100%   BMI 43.44 kg/m  Physical Exam Vitals reviewed.  Constitutional:      Appearance: Normal appearance.  HENT:     Head: Normocephalic.   Cardiovascular:     Rate and Rhythm: Normal rate and regular rhythm.     Pulses: Normal pulses.  Pulmonary:     Effort: Pulmonary effort is normal.   Skin:    General: Skin is warm and dry.     Capillary Refill: Capillary refill takes less than 2 seconds.   Neurological:     General: No focal deficit present.     Mental Status: She is alert and oriented to person, place, and time.   Psychiatric:        Mood and Affect: Mood normal.        Behavior: Behavior normal.        Thought Content: Thought content normal.        Judgment: Judgment normal.      MDM: Reviewed EMR PE, FHRT  MAU Course:  A No diagnosis found.  Medical screening exam complete  P Discharge from MAU in stable condition with *** precautions Follow up at *** as scheduled for ongoing prenatal care  Allergies as of 07/23/2023   No Known Allergies   Med Rec must be completed prior to using this [facility]***       Raford Bunk, MSN, CNM 07/23/2023 4:36 AM  Certified Nurse Midwife, [facility]

## 2023-07-23 NOTE — MAU Note (Signed)
 Melissa Gilmore is a 28 y.o. at [redacted]w[redacted]d here in MAU reporting: pelvic pain for the past couple of days, but a few hours ago ctx started. Denies VB. Reports feeling some liquid come out earlier I don't know if I just peed. Reports less FM today   LMP: NA Onset of complaint: 0000 Pain score: 10 Vitals:   07/23/23 0328  BP: 117/79  Pulse: 70  Temp: 98.4 F (36.9 C)  SpO2: 100%     FHT: 155  Lab orders placed from triage: labor eval

## 2023-07-24 ENCOUNTER — Ambulatory Visit (HOSPITAL_BASED_OUTPATIENT_CLINIC_OR_DEPARTMENT_OTHER): Payer: MEDICAID | Admitting: Maternal & Fetal Medicine

## 2023-07-24 ENCOUNTER — Ambulatory Visit: Payer: MEDICAID | Attending: Maternal & Fetal Medicine

## 2023-07-24 ENCOUNTER — Ambulatory Visit: Payer: MEDICAID

## 2023-07-24 VITALS — BP 149/87 | HR 78

## 2023-07-24 DIAGNOSIS — O0990 Supervision of high risk pregnancy, unspecified, unspecified trimester: Secondary | ICD-10-CM | POA: Diagnosis present

## 2023-07-24 DIAGNOSIS — E119 Type 2 diabetes mellitus without complications: Secondary | ICD-10-CM | POA: Diagnosis not present

## 2023-07-24 DIAGNOSIS — Z794 Long term (current) use of insulin: Secondary | ICD-10-CM

## 2023-07-24 DIAGNOSIS — O99213 Obesity complicating pregnancy, third trimester: Secondary | ICD-10-CM | POA: Diagnosis not present

## 2023-07-24 DIAGNOSIS — Z3A36 36 weeks gestation of pregnancy: Secondary | ICD-10-CM

## 2023-07-24 DIAGNOSIS — O24112 Pre-existing diabetes mellitus, type 2, in pregnancy, second trimester: Secondary | ICD-10-CM | POA: Diagnosis present

## 2023-07-24 DIAGNOSIS — Z361 Encounter for antenatal screening for raised alphafetoprotein level: Secondary | ICD-10-CM

## 2023-07-24 DIAGNOSIS — E669 Obesity, unspecified: Secondary | ICD-10-CM

## 2023-07-24 DIAGNOSIS — O24113 Pre-existing diabetes mellitus, type 2, in pregnancy, third trimester: Secondary | ICD-10-CM | POA: Insufficient documentation

## 2023-07-24 NOTE — Progress Notes (Signed)
   Patient information  Patient Name: Melissa Gilmore  Patient MRN:   [id number]  Referring practice: MFM Referring Provider: [facility])  Problem List   Patient Active Problem List   Diagnosis Date Noted   GBS (group B Streptococcus carrier), +RV culture, currently pregnant 07/23/2023   BMI 40.0-44.9, adult (HCC) 04/09/2023   Abnormal antenatal AFP screen 03/25/2023   Pre-existing type 2 diabetes mellitus in pregnancy 03/25/2023   Family history of spina bifida 02/27/2023   Supervision of high risk pregnancy, antepartum 02/22/2023   Lumbar radiculopathy 06/06/2022   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/2021   Schizophrenia (HCC) 07/17/2021   Depression 07/17/2021   Insulin -requiring or dependent type II diabetes mellitus (HCC) 07/17/2021    Maternal Fetal medicine Consult  Melissa Gilmore is a 28 y.o. G1P0000 at [redacted]w[redacted]d here for ultrasound and consultation. Melissa Gilmore is doing well today with no acute concerns. Today we focused on the following:   Type 2 diabetes: The patient reports her blood sugars are well-controlled.  I discussed the ultrasound findings showing the estimated fetal weight in the 90th percentile.  She will continue to assess her blood sugars and fetal movement.  The patient had time to ask questions that were answered to her satisfaction.  She verbalized understanding and agrees to proceed with the plan below.  Sonographic findings Single intrauterine pregnancy. Fetal cardiac activity: Observed. Presentation: Cephalic. Interval fetal anatomy appears normal. Fetal biometry shows the estimated fetal weight at the 90 percentile. Amniotic fluid: Within normal limits.  MVP: 5.51 cm. Placenta: Anterior. BPP: 8/8.   There are limitations of prenatal ultrasound such as the inability to detect certain abnormalities due to poor visualization. Various factors such as fetal position, gestational age and  maternal body habitus may increase the difficulty in visualizing the fetal anatomy.    Recommendations -Weekly antenatal testing until delivery - Delivery is planned for 6/26  Review of Systems: A review of systems was performed and was negative except per HPI   Vitals and Physical Exam    07/24/2023    1:17 PM 07/23/2023    5:45 AM 07/23/2023    5:19 AM  Vitals with BMI  Systolic 149 113 409  Diastolic 87 81 83  Pulse 78 70 77    Sitting comfortably on the sonogram table Nonlabored breathing Normal rate and rhythm Abdomen is nontender  Past pregnancies OB History  Gravida Para Term Preterm AB Living  1 0 0 0 0 0  SAB IAB Ectopic Multiple Live Births  0 0 0 0 0    # Outcome Date GA Lbr Len/2nd Weight Sex Type Anes PTL Lv  1 Current              I spent 20 minutes reviewing the patients chart, including labs and images as well as counseling the patient about her medical conditions. Greater than 50% of the time was spent in direct face-to-face patient counseling.  Melissa Gilmore  MFM, [facility]   07/24/2023  2:43 PM

## 2023-07-27 ENCOUNTER — Ambulatory Visit: Payer: MEDICAID | Admitting: Family Medicine

## 2023-07-27 ENCOUNTER — Encounter: Payer: Self-pay | Admitting: Family Medicine

## 2023-07-27 ENCOUNTER — Telehealth (HOSPITAL_COMMUNITY): Payer: Self-pay | Admitting: *Deleted

## 2023-07-27 ENCOUNTER — Other Ambulatory Visit: Payer: Self-pay

## 2023-07-27 VITALS — BP 126/87 | HR 79 | Wt 249.7 lb

## 2023-07-27 DIAGNOSIS — K219 Gastro-esophageal reflux disease without esophagitis: Secondary | ICD-10-CM

## 2023-07-27 DIAGNOSIS — O24113 Pre-existing diabetes mellitus, type 2, in pregnancy, third trimester: Secondary | ICD-10-CM | POA: Diagnosis not present

## 2023-07-27 DIAGNOSIS — F209 Schizophrenia, unspecified: Secondary | ICD-10-CM

## 2023-07-27 DIAGNOSIS — O99820 Streptococcus B carrier state complicating pregnancy: Secondary | ICD-10-CM | POA: Diagnosis not present

## 2023-07-27 DIAGNOSIS — O99343 Other mental disorders complicating pregnancy, third trimester: Secondary | ICD-10-CM

## 2023-07-27 DIAGNOSIS — O0990 Supervision of high risk pregnancy, unspecified, unspecified trimester: Secondary | ICD-10-CM

## 2023-07-27 DIAGNOSIS — O99613 Diseases of the digestive system complicating pregnancy, third trimester: Secondary | ICD-10-CM

## 2023-07-27 DIAGNOSIS — Z3A36 36 weeks gestation of pregnancy: Secondary | ICD-10-CM

## 2023-07-27 DIAGNOSIS — O280 Abnormal hematological finding on antenatal screening of mother: Secondary | ICD-10-CM

## 2023-07-27 MED ORDER — PANTOPRAZOLE SODIUM 20 MG PO TBEC: 20.0000 mg | DELAYED_RELEASE_TABLET | Freq: Every day | ORAL | 1 refills | Status: DC

## 2023-07-27 NOTE — Progress Notes (Signed)
   Subjective:  Melissa Gilmore is a 28 y.o. G1P0000 at [redacted]w[redacted]d being seen today for ongoing prenatal care.  She is currently monitored for the following issues for this high-risk pregnancy and has 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HCC); Schizophrenia (HCC); Depression; Insulin -requiring or dependent type II diabetes mellitus (HCC); Lumbar radiculopathy; Supervision of high risk pregnancy, antepartum; Family history of spina bifida; Abnormal antenatal AFP screen; Pre-existing type 2 diabetes mellitus in pregnancy; BMI 40.0-44.9, adult (HCC); and GBS (group B Streptococcus carrier), +RV culture, currently pregnant on their problem list.  Patient reports no complaints.  Contractions: Irritability. Vag. Bleeding: None.  Movement: Present. Denies leaking of fluid.   The following portions of the patient's history were reviewed and updated as appropriate: allergies, current medications, past family history, past medical history, past social history, past surgical history and problem list. Problem list updated.  Objective:   Vitals:   07/27/23 1056  BP: 126/87  Pulse: 79  Weight: 249 lb 11.2 oz (113.3 kg)    Fetal Status: Fetal Heart Rate (bpm): 122   Movement: Present     General:  Alert, oriented and cooperative. Patient is in no acute distress.  Skin: Skin is warm and dry. No rash noted.   Cardiovascular: Normal heart rate noted  Respiratory: Normal respiratory effort, no problems with respiration noted  Abdomen: Soft, gravid, appropriate for gestational age. Pain/Pressure: Present (pressure in pelvis)     Pelvic: Vag. Bleeding: None     Cervical exam deferred        Extremities: Normal range of motion.  Edema: Trace (ankles)  Mental Status: Normal mood and affect. Normal behavior. Normal judgment and thought content.   Urinalysis:      Assessment and Plan:  Pregnancy: G1P0000 at [redacted]w[redacted]d  1. Supervision of high risk pregnancy, antepartum (Primary) BP and FHR normal Previously  confirmed vertex on US  Having a metallic taste in the morning, making her throw up, likely acid reflux, trial PPI Also wondering about breast pumps, given info to request one through either [facility] or [facility]  2. Pre-existing type 2 diabetes mellitus during pregnancy in third trimester Regimen at last visit lantus  40u at bedtime, aspart 06/10/08 At that visit postprandials were uncontrolled, given general parameters for increasing insulin  and told to go up until controlled Today reports she has not changed dose of insulin  at all since last visit Log reviewed, not many checks. No change in regimen for now, has IOL soon Last growth US  07/24/2023, EFW 3344g 90%, AC >99%, AFI 14 Already scheduled for IOL on 08/02/2023  3. Schizophrenia, unspecified type [facility]) AH/VH as a teenager but has had three years without any symptoms and not on any meds at present Has regular psychiatrist and therapist at outside clinic [facility]  4. GBS (group B Streptococcus carrier), +RV culture, currently pregnant Ppx in labor  5. Abnormal antenatal AFP screen Abnormal AFP screening, normal anatomy scan  Weekly testing reassuring to date, BPP 8/8 on 07/24/2023  Preterm labor symptoms and general obstetric precautions including but not limited to vaginal bleeding, contractions, leaking of fluid and fetal movement were reviewed in detail with the patient. Please refer to After Visit Summary for other counseling recommendations.  Return in 7 weeks (on 09/14/2023) for PP check.   Teena Feast, MD

## 2023-07-27 NOTE — Telephone Encounter (Signed)
 Preadmission screen

## 2023-07-27 NOTE — Patient Instructions (Addendum)
 [facility] Breast Pumps [email or web address]  [facility] Breast Pumps [email or web address]  Birth Control Options Birth control is also called contraception. Birth control prevents pregnancy. There are many types of birth control. Work with your health care provider to find the best option for you. Birth control that uses hormones These types of birth control have hormones in them to prevent pregnancy. Birth control implant This is a small tube that is put into the skin of your arm. The tube can stay in for up to 3 years. Birth control shot These are shots you get every 3 months. Birth control pills This is a pill you take every day. You need to take it at the same time each day. Birth control patch This is a patch that you put on your skin. You change it 1 time each week for 3 weeks. After that, you take the patch off for 1 week. Vaginal ring  This is a soft plastic ring that you put in your vagina. The ring is left in for 3 weeks. Then, you take it out for 1 week. Then, you put a new ring in. Barrier methods  Female condom This is a thin covering that you put on the penis before sex. The condom is thrown away after sex. Female condom This is a soft, loose covering that you put in the vagina before sex. The condom is thrown away after sex. Diaphragm A diaphragm is a soft barrier that is shaped like a bowl. It must be made to fit your body. You put it in the vagina before sex with a chemical that  kills sperm called spermicide. A diaphragm should be left in the vagina for 6-8 hours after sex and taken out within 24 hours. You need to replace a diaphragm: Every 1-2 years. After giving birth. After gaining more than 15 lb (6.8 kg). If you have surgery on your pelvis. Cervical cap This is a small, soft cup that fits over the cervix. The cervix is the lowest part of the uterus. It's put in the vagina before sex, along with spermicide. The cap must be made for you. The cap should be left in for 6-8 hours after sex. It is taken out within 48 hours. A cervical cap must be prescribed and fit to your body by a provider. It should be replaced every 2 years. Sponge This is a small sponge that is put into the vagina before sex. It must be left in for at least 6 hours after sex. It must be taken out within 30 hours and thrown away. Spermicides These are chemicals that kill or stop sperm from getting into the uterus. They may be a pill, cream, jelly, or foam that you put into your vagina. They should be used at least 10-15 minutes before sex. Intrauterine device An intrauterine device (IUD) is a device that's put in the uterus by a provider. There are two types: Hormone IUD. This kind can stay in for 3-5 years. Copper IUD. This kind can stay in for 10 years. Permanent birth control Female tubal ligation This is surgery to block the fallopian tubes. Female sterilization This is a surgery, called a vasectomy, to tie off the tubes that carry sperm in men. This method takes 3 months to work. Other forms of birth control must be used for 3 months. Natural planning methods This means not having sex on the days the female partner could get pregnant. Here are some types of natural planning birth control: Using a calendar: To keep  track of the length of each menstrual cycle. To find out what days pregnancy can happen. To plan to not have sex on days when pregnancy can happen. Watching for signs of  ovulation and not having sex during this time. The female partner can check for ovulation by keeping track of their temperature each day. They can also look for changes in the mucus that comes from the cervix. Where to find more information Centers for Disease Control and Prevention: TonerPromos.no. Then: Enter birth control in the search box. This information is not intended to replace advice given to you by your health care provider. Make sure you discuss any questions you have with your health care provider. Document Revised: 10/26/2022 Document Reviewed: 03/21/2022 Elsevier Patient Education  2024 [employer].

## 2023-07-30 ENCOUNTER — Encounter (HOSPITAL_COMMUNITY): Payer: Self-pay | Admitting: *Deleted

## 2023-07-30 ENCOUNTER — Telehealth (HOSPITAL_COMMUNITY): Payer: Self-pay | Admitting: *Deleted

## 2023-07-30 NOTE — Telephone Encounter (Signed)
 Preadmission screen

## 2023-07-31 ENCOUNTER — Ambulatory Visit (INDEPENDENT_AMBULATORY_CARE_PROVIDER_SITE_OTHER): Payer: MEDICAID

## 2023-07-31 ENCOUNTER — Other Ambulatory Visit: Payer: Self-pay

## 2023-07-31 DIAGNOSIS — O0993 Supervision of high risk pregnancy, unspecified, third trimester: Secondary | ICD-10-CM

## 2023-07-31 DIAGNOSIS — Z8279 Family history of other congenital malformations, deformations and chromosomal abnormalities: Secondary | ICD-10-CM | POA: Diagnosis not present

## 2023-07-31 DIAGNOSIS — Z3A37 37 weeks gestation of pregnancy: Secondary | ICD-10-CM | POA: Diagnosis not present

## 2023-07-31 DIAGNOSIS — O0990 Supervision of high risk pregnancy, unspecified, unspecified trimester: Secondary | ICD-10-CM

## 2023-07-31 DIAGNOSIS — O24113 Pre-existing diabetes mellitus, type 2, in pregnancy, third trimester: Secondary | ICD-10-CM | POA: Diagnosis not present

## 2023-08-01 ENCOUNTER — Telehealth: Payer: Self-pay

## 2023-08-01 NOTE — Telephone Encounter (Signed)
 Incoming VM from [place] with [facility]. She is inquiring about the status of a form for a breast pump that was faxed to [phone]. Ref# [id number].

## 2023-08-02 ENCOUNTER — Inpatient Hospital Stay (HOSPITAL_COMMUNITY): Admission: RE | Admit: 2023-08-02 | Payer: MEDICAID | Source: Home / Self Care | Admitting: Family Medicine

## 2023-08-02 ENCOUNTER — Inpatient Hospital Stay (HOSPITAL_COMMUNITY)
Admission: AD | Admit: 2023-08-02 | Discharge: 2023-08-07 | DRG: 787 | Disposition: A | Discharge status: Home / Self Care | Payer: MEDICAID | Attending: Obstetrics and Gynecology | Admitting: Obstetrics and Gynecology

## 2023-08-02 ENCOUNTER — Encounter: Payer: Self-pay | Admitting: Obstetrics and Gynecology

## 2023-08-02 ENCOUNTER — Inpatient Hospital Stay (HOSPITAL_COMMUNITY): Payer: MEDICAID | Attending: Family Medicine

## 2023-08-02 ENCOUNTER — Inpatient Hospital Stay
Admission: EM | Admit: 2023-08-02 | Discharge: 2023-08-02 | Disposition: A | Discharge status: Home / Self Care | Payer: MEDICAID | Attending: Obstetrics and Gynecology | Admitting: Obstetrics and Gynecology

## 2023-08-02 ENCOUNTER — Other Ambulatory Visit: Payer: Self-pay

## 2023-08-02 ENCOUNTER — Encounter (HOSPITAL_COMMUNITY): Payer: Self-pay | Admitting: Family Medicine

## 2023-08-02 DIAGNOSIS — F209 Schizophrenia, unspecified: Secondary | ICD-10-CM | POA: Diagnosis present

## 2023-08-02 DIAGNOSIS — E119 Type 2 diabetes mellitus without complications: Secondary | ICD-10-CM | POA: Diagnosis present

## 2023-08-02 DIAGNOSIS — R030 Elevated blood-pressure reading, without diagnosis of hypertension: Secondary | ICD-10-CM | POA: Diagnosis not present

## 2023-08-02 DIAGNOSIS — Z23 Encounter for immunization: Secondary | ICD-10-CM | POA: Diagnosis not present

## 2023-08-02 DIAGNOSIS — O26893 Other specified pregnancy related conditions, third trimester: Secondary | ICD-10-CM | POA: Insufficient documentation

## 2023-08-02 DIAGNOSIS — O133 Gestational [pregnancy-induced] hypertension without significant proteinuria, third trimester: Secondary | ICD-10-CM | POA: Diagnosis not present

## 2023-08-02 DIAGNOSIS — O9081 Anemia of the puerperium: Secondary | ICD-10-CM | POA: Diagnosis not present

## 2023-08-02 DIAGNOSIS — O2412 Pre-existing diabetes mellitus, type 2, in childbirth: Secondary | ICD-10-CM | POA: Diagnosis present

## 2023-08-02 DIAGNOSIS — O24119 Pre-existing diabetes mellitus, type 2, in pregnancy, unspecified trimester: Principal | ICD-10-CM | POA: Diagnosis present

## 2023-08-02 DIAGNOSIS — Z3A37 37 weeks gestation of pregnancy: Secondary | ICD-10-CM

## 2023-08-02 DIAGNOSIS — O0990 Supervision of high risk pregnancy, unspecified, unspecified trimester: Secondary | ICD-10-CM

## 2023-08-02 DIAGNOSIS — D62 Acute posthemorrhagic anemia: Secondary | ICD-10-CM | POA: Diagnosis not present

## 2023-08-02 DIAGNOSIS — O163 Unspecified maternal hypertension, third trimester: Principal | ICD-10-CM

## 2023-08-02 DIAGNOSIS — Z794 Long term (current) use of insulin: Secondary | ICD-10-CM | POA: Diagnosis not present

## 2023-08-02 DIAGNOSIS — Z6841 Body Mass Index (BMI) 40.0 and over, adult: Secondary | ICD-10-CM

## 2023-08-02 DIAGNOSIS — O99820 Streptococcus B carrier state complicating pregnancy: Secondary | ICD-10-CM | POA: Diagnosis not present

## 2023-08-02 DIAGNOSIS — O99824 Streptococcus B carrier state complicating childbirth: Secondary | ICD-10-CM | POA: Diagnosis present

## 2023-08-02 DIAGNOSIS — O24113 Pre-existing diabetes mellitus, type 2, in pregnancy, third trimester: Secondary | ICD-10-CM

## 2023-08-02 DIAGNOSIS — E66813 Obesity, class 3: Secondary | ICD-10-CM | POA: Diagnosis present

## 2023-08-02 DIAGNOSIS — O134 Gestational [pregnancy-induced] hypertension without significant proteinuria, complicating childbirth: Secondary | ICD-10-CM | POA: Diagnosis present

## 2023-08-02 DIAGNOSIS — Z833 Family history of diabetes mellitus: Secondary | ICD-10-CM | POA: Diagnosis not present

## 2023-08-02 DIAGNOSIS — O212 Late vomiting of pregnancy: Secondary | ICD-10-CM | POA: Diagnosis not present

## 2023-08-02 DIAGNOSIS — O219 Vomiting of pregnancy, unspecified: Secondary | ICD-10-CM | POA: Clinically undetermined

## 2023-08-02 DIAGNOSIS — Z98891 History of uterine scar from previous surgery: Principal | ICD-10-CM

## 2023-08-02 DIAGNOSIS — Z3A38 38 weeks gestation of pregnancy: Secondary | ICD-10-CM | POA: Diagnosis not present

## 2023-08-02 DIAGNOSIS — O479 False labor, unspecified: Secondary | ICD-10-CM | POA: Clinically undetermined

## 2023-08-02 DIAGNOSIS — O99214 Obesity complicating childbirth: Secondary | ICD-10-CM | POA: Diagnosis present

## 2023-08-02 DIAGNOSIS — O280 Abnormal hematological finding on antenatal screening of mother: Secondary | ICD-10-CM | POA: Diagnosis present

## 2023-08-02 DIAGNOSIS — Z7982 Long term (current) use of aspirin: Secondary | ICD-10-CM

## 2023-08-02 DIAGNOSIS — O26899 Other specified pregnancy related conditions, unspecified trimester: Secondary | ICD-10-CM | POA: Clinically undetermined

## 2023-08-02 DIAGNOSIS — O99213 Obesity complicating pregnancy, third trimester: Secondary | ICD-10-CM | POA: Insufficient documentation

## 2023-08-02 DIAGNOSIS — O24312 Unspecified pre-existing diabetes mellitus in pregnancy, second trimester: Secondary | ICD-10-CM

## 2023-08-02 DIAGNOSIS — O139 Gestational [pregnancy-induced] hypertension without significant proteinuria, unspecified trimester: Secondary | ICD-10-CM | POA: Diagnosis present

## 2023-08-02 LAB — URINALYSIS, ROUTINE W REFLEX MICROSCOPIC
Bilirubin Urine: NEGATIVE
Glucose, UA: >=500 mg/dL — AB
Ketones, ur: NEGATIVE mg/dL
Nitrite: NEGATIVE
Protein, ur: 30 mg/dL — AB
Specific Gravity, Urine: 1.026 (ref 1.005–1.030)
pH: 6 (ref 5.0–8.0)

## 2023-08-02 LAB — COMPREHENSIVE METABOLIC PANEL WITH GFR
ALT: 44 U/L (ref 0–44)
AST: 47 U/L — ABNORMAL HIGH (ref 15–41)
Albumin: 2.8 g/dL — ABNORMAL LOW (ref 3.5–5.0)
Alkaline Phosphatase: 209 U/L — ABNORMAL HIGH (ref 38–126)
Anion gap: 16 — ABNORMAL HIGH (ref 5–15)
BUN: 9 mg/dL (ref 6–20)
CO2: 15 mmol/L — ABNORMAL LOW (ref 22–32)
Calcium: 8.8 mg/dL — ABNORMAL LOW (ref 8.9–10.3)
Chloride: 103 mmol/L (ref 98–111)
Creatinine, Ser: 0.74 mg/dL (ref 0.44–1.00)
GFR, Estimated: >60 mL/min (ref >60)
Glucose, Bld: 210 mg/dL — ABNORMAL HIGH (ref 70–99)
Potassium: 3.6 mmol/L (ref 3.5–5.1)
Sodium: 134 mmol/L — ABNORMAL LOW (ref 135–145)
Total Bilirubin: 0.5 mg/dL (ref 0.0–1.2)
Total Protein: 6.6 g/dL (ref 6.5–8.1)

## 2023-08-02 LAB — CBC
HCT: 35.5 % — ABNORMAL LOW (ref 36.0–46.0)
Hemoglobin: 11.5 g/dL — ABNORMAL LOW (ref 12.0–15.0)
MCH: 26.4 pg (ref 26.0–34.0)
MCHC: 32.4 g/dL (ref 30.0–36.0)
MCV: 81.4 fL (ref 80.0–100.0)
Platelets: 321 10*3/uL (ref 150–400)
RBC: 4.36 MIL/uL (ref 3.87–5.11)
RDW: 13.4 % (ref 11.5–15.5)
WBC: 10.5 10*3/uL (ref 4.0–10.5)
nRBC: 0 % (ref 0.0–0.2)

## 2023-08-02 LAB — TYPE AND SCREEN
ABO/RH(D): O POS
Antibody Screen: NEGATIVE

## 2023-08-02 MED ORDER — PENICILLIN G POT IN DEXTROSE 60000 UNIT/ML IV SOLN
3.0000 10*6.[IU] | INTRAVENOUS | Status: DC
  Administered 2023-08-03 – 2023-08-05 (×12): 3 10*6.[IU] via INTRAVENOUS
  Filled 2023-08-02 (×12): qty 50

## 2023-08-02 MED ORDER — LACTATED RINGERS IV BOLUS: 1000.0000 mL | Freq: Once | INTRAVENOUS | Status: DC

## 2023-08-02 MED ORDER — SODIUM CHLORIDE 0.9 % IV SOLN
5.0000 10*6.[IU] | Freq: Once | INTRAVENOUS | Status: AC
  Administered 2023-08-02: 5 10*6.[IU] via INTRAVENOUS
  Filled 2023-08-02: qty 5

## 2023-08-02 MED ORDER — MISOPROSTOL 25 MCG QUARTER TABLET
25.0000 ug | ORAL_TABLET | Freq: Once | ORAL | Status: AC
  Administered 2023-08-02: 25 ug via VAGINAL
  Filled 2023-08-02: qty 1

## 2023-08-02 MED ORDER — INSULIN GLARGINE-YFGN 100 UNIT/ML ~~LOC~~ SOLN
40.0000 [IU] | SUBCUTANEOUS | Status: AC
  Administered 2023-08-02: 40 [IU] via SUBCUTANEOUS
  Filled 2023-08-02: qty 0.4

## 2023-08-02 MED ORDER — INSULIN ASPART 100 UNIT/ML IJ SOLN
0.0000 [IU] | INTRAMUSCULAR | Status: DC
  Administered 2023-08-03: 3 [IU] via SUBCUTANEOUS

## 2023-08-02 MED ORDER — OXYTOCIN-SODIUM CHLORIDE 30-0.9 UT/500ML-% IV SOLN
2.5000 [IU]/h | INTRAVENOUS | Status: DC
  Filled 2023-08-02 (×2): qty 500

## 2023-08-02 MED ORDER — SOD CITRATE-CITRIC ACID 500-334 MG/5ML PO SOLN: 30.0000 mL | ORAL | Status: DC | PRN

## 2023-08-02 MED ORDER — LACTATED RINGERS IV SOLN: INTRAVENOUS | Status: DC

## 2023-08-02 MED ORDER — LACTATED RINGERS IV SOLN
500.0000 mL | INTRAVENOUS | Status: DC | PRN
  Administered 2023-08-04: 500 mL via INTRAVENOUS
  Administered 2023-08-04: 1000 mL via INTRAVENOUS

## 2023-08-02 MED ORDER — ACETAMINOPHEN 325 MG PO TABS
650.0000 mg | ORAL_TABLET | ORAL | Status: DC | PRN
  Administered 2023-08-03 (×3): 650 mg via ORAL
  Filled 2023-08-02 (×3): qty 2

## 2023-08-02 MED ORDER — FENTANYL CITRATE (PF) 100 MCG/2ML IJ SOLN
50.0000 ug | INTRAMUSCULAR | Status: DC | PRN
  Administered 2023-08-03 (×2): 100 ug via INTRAVENOUS
  Filled 2023-08-02: qty 2

## 2023-08-02 MED ORDER — MISOPROSTOL 50MCG HALF TABLET
50.0000 ug | ORAL_TABLET | ORAL | Status: DC
  Administered 2023-08-02 – 2023-08-03 (×5): 50 ug via ORAL
  Filled 2023-08-02 (×5): qty 1

## 2023-08-02 MED ORDER — SODIUM CHLORIDE 0.9% FLUSH: 3.0000 mL | INTRAVENOUS | Status: DC | PRN

## 2023-08-02 MED ORDER — SODIUM CHLORIDE 0.9% FLUSH: 3.0000 mL | Freq: Two times a day (BID) | INTRAVENOUS | Status: DC

## 2023-08-02 MED ORDER — LIDOCAINE HCL (PF) 1 % IJ SOLN: 30.0000 mL | INTRAMUSCULAR | Status: DC | PRN

## 2023-08-02 MED ORDER — FENTANYL CITRATE (PF) 100 MCG/2ML IJ SOLN
50.0000 ug | INTRAMUSCULAR | Status: DC | PRN
  Filled 2023-08-02: qty 2

## 2023-08-02 MED ORDER — ONDANSETRON HCL 4 MG/2ML IJ SOLN
4.0000 mg | Freq: Four times a day (QID) | INTRAMUSCULAR | Status: DC | PRN
  Administered 2023-08-04 – 2023-08-05 (×2): 4 mg via INTRAVENOUS
  Filled 2023-08-02 (×2): qty 2

## 2023-08-02 MED ORDER — OXYTOCIN BOLUS FROM INFUSION: 333.0000 mL | Freq: Once | INTRAVENOUS | Status: DC

## 2023-08-02 MED ORDER — ACETAMINOPHEN 500 MG PO TABS: 1000.0000 mg | ORAL_TABLET | Freq: Once | ORAL | Status: DC

## 2023-08-02 MED ORDER — TERBUTALINE SULFATE 1 MG/ML IJ SOLN: 0.2500 mg | Freq: Once | INTRAMUSCULAR | Status: DC | PRN

## 2023-08-02 NOTE — OB Triage Note (Signed)
 Pt discharged per CNM and MD order and advised to go immediately to [facility] to triage as she has a scheduled IOL. Pt verbalizes understanding and states that her mother will drive her there.

## 2023-08-02 NOTE — Discharge Summary (Addendum)
 Melissa Gilmore is a 28 y.o. female. She is at [redacted]w[redacted]d gestation. Patient's last menstrual period was 11/12/2022 (exact date). Estimated Date of Delivery: 08/19/23  [facility]ren[facility] care site: [facility]   Chief complaint: Uterine Contractions   HPI: Melissa Gilmore presents to L&D with concerns of uterine contractions. She reports uterine contractions woke her up out of her sleep around 2300 yesterday (06/25). She states her contractions come every 10 minutes and she rates them as 10/10. She also states that she has been having diarrhea (since 2300 on 06/25) and vomiting (since 0500 today 06/26) which she and her partner states is mostly likely due to eating spicy food. Partner reports that she has not been able to tolerate spicy foods since being pregnant. Patient denies being around any ill individuals. She denies vaginal bleeding and leakage of fluid. Endorses active fetal movement.   Patient reported she was scheduled for IOL due to uncontrolled GDM today (06/26) (confirmed by Lola, MD) at [facility], but was postponed due to L&D capacity.   Factors complicating pregnancy: GBS Positive Obesity- BMI 40.0-44.9 Abnormal antenatal AFP screen Pre-existing type 2 diabetes mellitus in pregnancy  Family history of spina bifida Lumbar radiculopathy Dissociative identity disorder Schizophrenia  Depression Insulin -requiring or dependent type II diabetes mellitus   S: Resting comfortably. Reports no vaginal bleeding and no leakage of fluid. Reports active fetal movement.   Maternal Medical History:  Past Medical Hx:  has a past medical history of Acute cholecystitis with chronic cholecystitis (07/16/2021), Acute lumbar back pain (03/18/2022), Anxiety, Childhood asthma, Depression, Diabetes mellitus without complication (HCC), 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HCC), Dyslipidemia (07/17/2021), Headache, History of abnormal uterine bleeding (07/17/2021),  Schizophrenia (HCC), Syncope (03/23/2022), and Urticaria.    Past Surgical Hx:  has a past surgical history that includes Cholecystectomy (N/A, 07/17/2021); IR Epidurography (03/21/2022); Wisdom tooth extraction; and Lumbar laminectomy/ decompression with met-rx (Right, 06/06/2022).   No Known Allergies   Prior to Admission medications   Medication Sig Start Date End Date Taking? Authorizing Provider  acetaminophen  (TYLENOL ) 650 MG CR tablet Take 650 mg by mouth every 8 (eight) hours as needed for pain.   Yes [provider]  aspirin EC 81 MG tablet Take 81 mg by mouth daily. Swallow whole.   Yes [provider]  cetirizine  (ZYRTEC  ALLERGY ) 10 MG tablet Take 1 tablet (10 mg total) by mouth daily as needed for allergies. 05/08/23  Yes Patel, Arleta SQUIBB, MD  Continuous Glucose Sensor (FREESTYLE LIBRE 3 SENSOR) MISC Place 1 sensor on the skin every 14 days. Use to check glucose continuously 05/15/23  Yes Izell Harari, MD  famotidine  (PEPCID ) 20 MG tablet Take 1 tablet (20 mg total) by mouth 2 (two) times daily. 05/15/23  Yes Izell Harari, MD  insulin  aspart (NOVOLOG  FLEXPEN) 100 UNIT/ML FlexPen Inject 3 Units into the skin 3 (three) times daily with meals. Inject 5 units at breakfast and lunch, the 10 units with dinner. 07/10/23  Yes Eveline Lynwood MATSU, MD  insulin  glargine (LANTUS  SOLOSTAR) 100 UNIT/ML Solostar Pen Inject 40 Units into the skin at bedtime. 07/10/23  Yes Eveline Lynwood MATSU, MD  Insulin  Pen Needle (B-D UF III MINI PEN NEEDLES) 31G X 5 MM MISC Use as instructed. Inject into the skin once dailyUse as instructed. Inject into the skin once daily (Changed Rx to QID on 06/29/23) 06/29/23  Yes Leftwich-Kirby, Olam LABOR, CNM  Prenatal 28-0.8 MG TABS Take 1 tablet by mouth daily. 02/05/23  Yes  Anyanwu, Ugonna A, MD  cyclobenzaprine  (FLEXERIL ) 10 MG tablet Take 1 tablet (10 mg total) by mouth 2 (two) times daily as needed for muscle spasms. Patient not taking: Reported on 08/02/2023 07/23/23    Regino Camie LABOR, CNM  fluticasone  (FLONASE ) 50 MCG/ACT nasal spray Place 1 spray into both nostrils daily as needed for allergies or rhinitis. Patient not taking: Reported on 08/02/2023 05/08/23   Tobie Arleta SQUIBB, MD  pantoprazole  (PROTONIX ) 20 MG tablet Take 1 tablet (20 mg total) by mouth at bedtime. Patient not taking: Reported on 08/02/2023 07/27/23   Lola Donnice HERO, MD    Social History: She  reports that she has never smoked. She has never been exposed to tobacco smoke. She has never used smokeless tobacco. She reports that she does not currently use alcohol. She reports that she does not use drugs.  Family History: family history includes Allergic rhinitis in her father; Diabetes in her maternal grandmother; Stomach cancer in her maternal grandmother.  Review of Systems:  Review of Systems  Constitutional: Negative.   Eyes: Negative.   Respiratory: Negative.    Cardiovascular: Negative.   Gastrointestinal:  Positive for abdominal pain (reports moderate/severe contractions) and nausea. Negative for vomiting.  Neurological: Negative.   Psychiatric/Behavioral: Negative.      O:  BP (!) 138/97 (BP Location: Left Arm)   Pulse 83   Temp 97.7 F (36.5 C) (Oral)   Resp 18   Ht 5' 4 (1.626 m)   Wt 113.4 kg   LMP 11/12/2022 (Exact Date)   BMI 42.91 kg/m  No results found for this or any previous visit (from the past 48 hours).   Vitals:   08/02/23 1604 08/02/23 1617  BP: (!) 143/88 (!) 138/97  Pulse: 93 83  Temp:  97.7 F (36.5 C)  Resp:  18  Height:  5' 4 (1.626 m)  Weight:  113.4 kg  TempSrc:  Oral  BMI (Calculated):  42.89     Constitutional: NAD, AAOx3  HE/ENT: extraocular movements grossly intact CV: RRR PULM: nl respiratory effort Abd: gravid, non-tender, non-distended, soft  Ext: Non-tender, Nonedmeatous Psych: mood appropriate, speech normal Pelvic : Deferred  SVE: Dilation: Closed Exam by:: J Kaijah Abts CNM   NST (08/02/2023): Baseline: 150  bpm Variability: moderate Accels: Present Decels: none Toco: occasional contraction x 2  and uterine irritability observed  *Broken FHR tracing present due to maternal movement* Category: I Interpretation:  INDICATIONS: rule out uterine contractions RESULTS:  A NST procedure was performed with FHR monitoring and a normal baseline established, appropriate time of 20-40 minutes of evaluation, and accels >2 seen w 15x15 characteristics.  Results show a REACTIVE NST.   Assessment: 28 y.o. [redacted]w[redacted]d here for antenatal surveillance during pregnancy.  Principle diagnosis: Uterine Contractions   Plan:  Uterine Contractions  Labor: Not present. US  and Toco placed on abdomen. Abdomen soft/nontender to palpation. Mild/moderate contractions x2 palpated by CNM. Occasional contractions x 2 observed on FHR tracing. FHR tracing observed for approximately 43 minutes. SVE: closed/thick/high.  Fetal Wellbeing: Reassuring Cat 1 tracing. Reactive NST   2. Elevated Blood Pressure Reading  BP at L&D triage--> 143/88, 138/97 (08/02/2023) Denies signs/symptoms of Pre-E.  Patient referred to L&D triage @ [facility] in [place] for gHTN/Pre-E work-up due to scheduled IOL today (06/26).  Patient strongly advised to go directly to L&D triage @ [facility] in [place] this evening (06/26) for further evaluation and reassessment in IOL status due to elevated blood pressure readings. Patient verbalized understanding.  3. Nausea and vomiting/diarrhea in pregnancy Patient encouraged to increase fluid intake and eat bland foods to manage symptoms.  Patient informed that symptoms should subside in a few days.  Notify OB provider if symptoms worsen or do not improve.   - Plan of care reviewed and discussed with S.D. Leonce, MD.  - D/c in stable condition, strict return precautions reviewed, follow-up as needed.   ----- Keric Zehren, CNM Certified Nurse Midwife [facility]

## 2023-08-02 NOTE — Discharge Instructions (Signed)
*  Please go directly to [facility] in [facility] for gHTN/Pre-E (blood pressure) work-up in L&D triage *

## 2023-08-02 NOTE — H&P (Addendum)
 OBSTETRIC ADMISSION HISTORY AND PHYSICAL  Melissa Gilmore is a 28 y.o. female G1P0000 with IUP at [redacted]w[redacted]d by LMP presenting for IOL for Type 2 DM. She reports +FMs, No LOF, no VB, no blurry vision, RUQ pain or peripheral edema. She does report overall upper abdominal tenderness and a mild headache. She plans on breast and formula feeding. She is undecided on which birth control she would like. She received her prenatal care at [facility]   Dating: By LMP --->  Estimated Date of Delivery: 08/19/23  Sono:    @[redacted]w[redacted]d , CWD, normal anatomy, cephalic presentation, anterior placenta, 3344g, 90% EFW   Prenatal History/Complications:  NURSING  PROVIDER  [employer] Dating by LMP c/w 13 wk US   [facility] Model Traditional Anatomy U/S Initially IUGR>normal since. Normal anatomy  Initiated care at  14wks                Language  English              LAB RESULTS   Support Person [name] Genetics NIPS: LR F AFP: elevated 2.29 (cut off 2.0 in insulin  dependent diabetes)    NT/IT (FT only)     Carrier Screen Horizon: neg 4/4  Rhogam  O/Positive/-- (01/21 1133) A1C/GTT Type 2 DM  Flu Vaccine     TDaP Vaccine   Blood Type O/Positive/-- (01/21 1133)  RSV Vaccine  Antibody Negative (01/21 1133)  COVID Vaccine  Rubella 0.93 (01/21 1133)  Feeding Plan bottle RPR Non Reactive (04/15 1040)  Contraception Unsure HBsAg Negative (01/21 1133)  Circumcision If boy, Yes  HIV Non Reactive (04/15 1040)  Pediatrician  [facility]  HCVAb Non Reactive (01/21 1133)  Prenatal Classes     BTL Consent  Pap Diagnosis  Date Value Ref Range Status  02/05/2023   Final   - Negative for intraepithelial lesion or malignancy (NILM)    BTL Pre-payment  GC/CT Initial:  neg/neg 36wks:  neg/neg  VBAC Consent  GBS Positive/-- (06/13 1219) For PCN allergy , check sensitivities   BRx Optimized? [ ]  yes   [ ]  no    DME Rx [ ]  BP cuff [ ]  Weight Scale Waterbirth  [ ]  Class [ ]  Consent [ ]  CNM visit  PHQ9 &  GAD7 [  ] new OB [  ] 28 weeks  [  ] 36 weeks Induction  [ ]  Orders Entered [ ] Foley Y/N     Past Medical History: Past Medical History:  Diagnosis Date   Acute cholecystitis with chronic cholecystitis 07/16/2021   Acute lumbar back pain 03/18/2022   Anxiety    Childhood asthma    Depression    Major Depression disorder   Diabetes mellitus without complication (HCC)    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HCC)    Dyslipidemia 07/17/2021   Headache    Migraines   History of abnormal uterine bleeding 07/17/2021   Schizophrenia (HCC)    Syncope 03/23/2022   Urticaria     Past Surgical History: Past Surgical History:  Procedure Laterality Date   CHOLECYSTECTOMY N/A 07/17/2021   Procedure: SINGLE SITE LAPAROSCOPIC CHOLECYSTECTOMY WITH INTRAOPERATIVE CHOLANGIOGRAM; LIVER BIOPSY;  Surgeon: Sheldon Standing, MD;  Location: [facility]S;  Service: General;  Laterality: N/A;   IR EPIDUROGRAPHY  03/21/2022   LUMBAR LAMINECTOMY/ DECOMPRESSION WITH MET-RX Right 06/06/2022   Procedure: Right Lumbar three-four Minimally Invasive Surgery Laminectomy/Discectomy;  Surgeon: Cheryle Debby LABOR, MD;  Location: [facility] OR;  Service: Neurosurgery;  Laterality: Right;  WISDOM TOOTH EXTRACTION      Obstetrical History: OB History     Gravida  1   Para  0   Term  0   Preterm  0   AB  0   Living  0      SAB  0   IAB  0   Ectopic  0   Multiple  0   Live Births  0           Social History Social History   Socioeconomic History   Marital status: Single    Spouse name: Not on file   Number of children: Not on file   Years of education: Not on file   Highest education level: 11th grade  Occupational History   Not on file  Tobacco Use   Smoking status: Never    Passive exposure: Never   Smokeless tobacco: Never  Vaping Use   Vaping status: Never Used  Substance and Sexual Activity   Alcohol use: Not Currently    Comment: Occasional- events   Drug use: Never   Sexual activity:  Yes    Birth control/protection: None  Other Topics Concern   Not on file  Social History Narrative   Not on file   Social Drivers of Health   Financial Resource Strain: Medium Risk (12/12/2022)   Overall Financial Resource Strain (CARDIA)    Difficulty of Paying Living Expenses: Somewhat hard  Food Insecurity: Food Insecurity Present (12/12/2022)   Hunger Vital Sign    Worried About Running Out of Food in the Last Year: Sometimes true    Ran Out of Food in the Last Year: Sometimes true  Transportation Needs: No Transportation Needs (06/27/2023)   PRAPARE - Administrator, Civil Service (Medical): No    Lack of Transportation (Non-Medical): No  Physical Activity: Insufficiently Active (12/12/2022)   Exercise Vital Sign    Days of Exercise per Week: 3 days    Minutes of Exercise per Session: 30 min  Stress: No Stress Concern Present (12/12/2022)   [employer] of Occupational Health - Occupational Stress Questionnaire    Feeling of Stress : Only a little  Social Connections: Moderately Isolated (12/12/2022)   Social Connection and Isolation Panel    Frequency of Communication with Friends and Family: Three times a week    Frequency of Social Gatherings with Friends and Family: Twice a week    Attends Religious Services: Never    Database administrator or Organizations: No    Attends Engineer, structural: Not on file    Marital Status: Living with partner    Family History: Family History  Problem Relation Age of Onset   Allergic rhinitis Father    Diabetes Maternal Grandmother    Stomach cancer Maternal Grandmother     Allergies: No Known Allergies  Medications Prior to Admission  Medication Sig Dispense Refill Last Dose/Taking   aspirin EC 81 MG tablet Take 81 mg by mouth daily. Swallow whole.   08/01/2023   famotidine  (PEPCID ) 20 MG tablet Take 1 tablet (20 mg total) by mouth 2 (two) times daily. 60 tablet 3 08/02/2023   insulin  aspart (NOVOLOG   FLEXPEN) 100 UNIT/ML FlexPen Inject 3 Units into the skin 3 (three) times daily with meals. Inject 5 units at breakfast and lunch, the 10 units with dinner. 15 mL 6 08/02/2023   insulin  glargine (LANTUS  SOLOSTAR) 100 UNIT/ML Solostar Pen Inject 40 Units into the skin at bedtime.  08/01/2023   Prenatal 28-0.8 MG TABS Take 1 tablet by mouth daily. 30 tablet 12 08/02/2023   acetaminophen  (TYLENOL ) 650 MG CR tablet Take 650 mg by mouth every 8 (eight) hours as needed for pain.      cetirizine  (ZYRTEC  ALLERGY ) 10 MG tablet Take 1 tablet (10 mg total) by mouth daily as needed for allergies. 30 tablet 11    Continuous Glucose Sensor (FREESTYLE LIBRE 3 SENSOR) MISC Place 1 sensor on the skin every 14 days. Use to check glucose continuously 2 each 6    Insulin  Pen Needle (B-D UF III MINI PEN NEEDLES) 31G X 5 MM MISC Use as instructed. Inject into the skin once dailyUse as instructed. Inject into the skin once daily (Changed Rx to QID on 06/29/23) 100 each 3      Review of Systems   All systems reviewed and negative except as stated in HPI  Blood pressure (!) 141/97, pulse (!) 127, temperature 98.7 F (37.1 C), temperature source Oral, resp. rate 19, last menstrual period 11/12/2022, SpO2 94%. General appearance: alert, cooperative, and no distress Lungs: clear to auscultation bilaterally Heart: regular rate and rhythm Abdomen: soft, non-tender; bowel sounds normal Extremities: no sign of DVT Presentation: cephalic Fetal monitoringBaseline: 150 bpm, Variability: Good {> 6 bpm), Accelerations: Reactive, and Decelerations: Absent Uterine activityNone Dilation: Fingertip Effacement (%): Thick Station: -3 Exam by:: Amy   Prenatal labs: ABO, Rh: --/--/O POS (06/26 2126) Antibody: NEG (06/26 2126) Rubella: 0.93 (01/21 1133) RPR: Non Reactive (04/15 1040)  HBsAg: Negative (01/21 1133)  HIV: Non Reactive (04/15 1040)  GBS: Positive/-- (06/13 1219)    Lab Results  Component Value Date   GBS  Positive (A) 07/20/2023   GTT - type 2 DM Genetic screening  WNL Anatomy US  WNL  Immunization History  Administered Date(s) Administered   Influenza, Seasonal, Injecte, Preservative Fre 12/12/2022   Influenza,inj,Quad PF,6+ Mos 02/27/2019, 12/03/2019, 12/15/2021   PFIZER(Purple Top)SARS-COV-2 Vaccination 06/18/2019, 07/09/2019    Prenatal Transfer Tool  Maternal Diabetes: Yes:  Diabetes Type:  Pre-pregnancy, Insulin /Medication controlled Genetic Screening: Normal Maternal Ultrasounds/Referrals: Normal Fetal Ultrasounds or other Referrals:  None Maternal Substance Abuse:  No Significant Maternal Medications:  Meds include: Other:  Significant Maternal Lab Results: Group B Strep positive Number of Prenatal Visits:greater than 3 verified prenatal visits Maternal Vaccinations:None Other Comments:  None   Results for orders placed or performed during the hospital encounter of 08/02/23 (from the past 24 hours)  CBC   Collection Time: 08/02/23  9:26 PM  Result Value Ref Range   WBC 10.5 4.0 - 10.5 K/uL   RBC 4.36 3.87 - 5.11 MIL/uL   Hemoglobin 11.5 (L) 12.0 - 15.0 g/dL   HCT 64.4 (L) 63.9 - 53.9 %   MCV 81.4 80.0 - 100.0 fL   MCH 26.4 26.0 - 34.0 pg   MCHC 32.4 30.0 - 36.0 g/dL   RDW 86.5 88.4 - 84.4 %   Platelets 321 150 - 400 K/uL   nRBC 0.0 0.0 - 0.2 %  Comprehensive metabolic panel   Collection Time: 08/02/23  9:26 PM  Result Value Ref Range   Sodium 134 (L) 135 - 145 mmol/L   Potassium 3.6 3.5 - 5.1 mmol/L   Chloride 103 98 - 111 mmol/L   CO2 15 (L) 22 - 32 mmol/L   Glucose, Bld 210 (H) 70 - 99 mg/dL   BUN 9 6 - 20 mg/dL   Creatinine, Ser 9.25 0.44 - 1.00 mg/dL   Calcium  8.8 (  L) 8.9 - 10.3 mg/dL   Total Protein 6.6 6.5 - 8.1 g/dL   Albumin 2.8 (L) 3.5 - 5.0 g/dL   AST 47 (H) 15 - 41 U/L   ALT 44 0 - 44 U/L   Alkaline Phosphatase 209 (H) 38 - 126 U/L   Total Bilirubin 0.5 0.0 - 1.2 mg/dL   GFR, Estimated >39 >39 mL/min   Anion gap 16 (H) 5 - 15  Type and  screen   Collection Time: 08/02/23  9:26 PM  Result Value Ref Range   ABO/RH(D) O POS    Antibody Screen NEG    Sample Expiration      08/05/2023,2359 Performed at [facility], [address] [place], [place] [address]     Patient Active Problem List   Diagnosis Date Noted   Uterine contractions during pregnancy 08/02/2023   Elevated blood pressure affecting pregnancy in third trimester, antepartum 08/02/2023   Nausea and vomiting during pregnancy 08/02/2023   Diarrhea during pregnancy 08/02/2023   Type 2 diabetes mellitus during pregnancy 08/02/2023   GBS (group B Streptococcus carrier), +RV culture, currently pregnant 07/23/2023   BMI 40.0-44.9, adult (HCC) 04/09/2023   Abnormal antenatal AFP screen 03/25/2023   Pre-existing type 2 diabetes mellitus in pregnancy 03/25/2023   Family history of spina bifida 02/27/2023   Supervision of high risk pregnancy, antepartum 02/22/2023   Lumbar radiculopathy 06/06/2022   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/2021   Schizophrenia (HCC) 07/17/2021   Depression 07/17/2021   Insulin -requiring or dependent type II diabetes mellitus (HCC) 07/17/2021    Assessment/Plan:  Melissa Gilmore is a 28 y.o. G1P0000 at [redacted]w[redacted]d here for IOL for insulin  controlled type 2 diabetes with new onset gHTN. Cat 1 FHT   #Labor: Reviewed induction methods with patient and her family. Cervix is 0.5cm and thick so dual Cytotec  to start. Plan to place FB when more dilated. Patient consented to this plan. #Preeclampsia: New gHTN today with mild headache. CBC and CMP ok, Protein creatinine ratio pending. #Pain: Epidural at patient request #FWB: Cat 1 FHT #GBS status:  Positive > PCN #Feeding: Breast and formula #Reproductive Life planning: Undecided #Circ:  not applicable  Mitzie JINNY Molly, Student-MidWife  08/02/2023, 11:25 PM  I personally saw and evaluated the patient, performing the key elements of the service. I developed and verified  the management plan that is described in the resident's/student's note, and I agree with the content with my edits above. VSS, HRR&R, Resp unlabored, Legs neg.  Sherrell Ely, CNM 08/03/2023 6:32 AM

## 2023-08-02 NOTE — OB Triage Note (Signed)
 Pt is a 27yo G1P0, who is 37w 4d. She presents to L&D triage for ctx.  Pt states that ctx woke her out of her sleep around 2300 yesterday. She states the ctx come q75mins and are 10/10 on pain scale. She states that she has also been having diarrhea since 2300 yesterday with hyperemesis starting at 0500 today. She states that she has atleast vomited 15x. Pt states that she had an IOL for uncontrolled diabetes scheduled last night at [facility] but was told they were full and they would call when she could come. Denies vaginal bleeding, endorses positive FM. Monitors applied and assessing.

## 2023-08-02 NOTE — MAU Note (Signed)
 Melissa Gilmore is a 28 y.o. at [redacted]w[redacted]d here in MAU reporting:   Pt states she started having ctx every 10 min.  8/10 ctx pain. Pt went to [place] since it was closer but then she was told to come here to MAU due to her BP being high.   Pt reports having N/V and diarrhea all day.  +FM.  Cervical exam- Closed at [place]. With bloody show.  No lOF.   Scheduled IOL 08/02/23 Gbs POS.   Type 2 diabetic.- lantus  at bedtime and novalog.  Free style monitor on Right arm.  205 sugar on phone.    Vitals:   08/02/23 1949  BP: (!) 137/97  Pulse: (!) 128  Resp: 19  Temp: 98 F (36.7 C)  SpO2: 100%    FHT: 157 Lab orders placed from triage: UA

## 2023-08-02 NOTE — MAU Note (Signed)

## 2023-08-03 ENCOUNTER — Inpatient Hospital Stay (HOSPITAL_COMMUNITY): Payer: MEDICAID | Admitting: Anesthesiology

## 2023-08-03 ENCOUNTER — Encounter: Payer: MEDICAID | Admitting: Obstetrics and Gynecology

## 2023-08-03 DIAGNOSIS — O139 Gestational [pregnancy-induced] hypertension without significant proteinuria, unspecified trimester: Secondary | ICD-10-CM | POA: Diagnosis present

## 2023-08-03 LAB — PROTEIN / CREATININE RATIO, URINE
Creatinine, Urine: 262 mg/dL
Protein Creatinine Ratio: 0.12 mg/mg{creat} (ref 0.00–0.15)
Total Protein, Urine: 32 mg/dL

## 2023-08-03 LAB — CBC
HCT: 33.7 % — ABNORMAL LOW (ref 36.0–46.0)
Hemoglobin: 11 g/dL — ABNORMAL LOW (ref 12.0–15.0)
MCH: 26.4 pg (ref 26.0–34.0)
MCHC: 32.6 g/dL (ref 30.0–36.0)
MCV: 81 fL (ref 80.0–100.0)
Platelets: 276 10*3/uL (ref 150–400)
RBC: 4.16 MIL/uL (ref 3.87–5.11)
RDW: 13.3 % (ref 11.5–15.5)
WBC: 10 10*3/uL (ref 4.0–10.5)
nRBC: 0 % (ref 0.0–0.2)

## 2023-08-03 LAB — GLUCOSE, CAPILLARY
Glucose-Capillary: 161 mg/dL — ABNORMAL HIGH (ref 70–99)
Glucose-Capillary: 77 mg/dL (ref 70–99)

## 2023-08-03 LAB — RPR: RPR Ser Ql: NONREACTIVE

## 2023-08-03 MED ORDER — FENTANYL-BUPIVACAINE-NACL 0.5-0.125-0.9 MG/250ML-% EP SOLN
12.0000 mL/h | EPIDURAL | Status: DC | PRN
  Administered 2023-08-03 – 2023-08-04 (×2): 12 mL/h via EPIDURAL
  Filled 2023-08-03 (×2): qty 250

## 2023-08-03 MED ORDER — PHENYLEPHRINE 80 MCG/ML (10ML) SYRINGE FOR IV PUSH (FOR BLOOD PRESSURE SUPPORT): 80.0000 ug | PREFILLED_SYRINGE | INTRAVENOUS | Status: DC | PRN

## 2023-08-03 MED ORDER — FAMOTIDINE 20 MG PO TABS
40.0000 mg | ORAL_TABLET | Freq: Every day | ORAL | Status: DC
  Administered 2023-08-03: 40 mg via ORAL
  Filled 2023-08-03: qty 2

## 2023-08-03 MED ORDER — LACTATED RINGERS IV SOLN
500.0000 mL | Freq: Once | INTRAVENOUS | Status: AC
  Administered 2023-08-03: 500 mL via INTRAVENOUS

## 2023-08-03 MED ORDER — LIDOCAINE HCL (PF) 1 % IJ SOLN
INTRAMUSCULAR | Status: DC | PRN
  Administered 2023-08-03: 8 mL via EPIDURAL

## 2023-08-03 MED ORDER — EPHEDRINE 5 MG/ML INJ
10.0000 mg | INTRAVENOUS | Status: DC | PRN
Start: 2023-08-03 — End: 2023-08-05

## 2023-08-03 MED ORDER — TERBUTALINE SULFATE 1 MG/ML IJ SOLN: 0.2500 mg | Freq: Once | INTRAMUSCULAR | Status: DC | PRN

## 2023-08-03 MED ORDER — OXYTOCIN-SODIUM CHLORIDE 30-0.9 UT/500ML-% IV SOLN
1.0000 m[IU]/min | INTRAVENOUS | Status: DC
  Administered 2023-08-03: 2 m[IU]/min via INTRAVENOUS

## 2023-08-03 MED ORDER — DIPHENHYDRAMINE HCL 50 MG/ML IJ SOLN
12.5000 mg | INTRAMUSCULAR | Status: DC | PRN
  Administered 2023-08-04 (×2): 12.5 mg via INTRAVENOUS
  Filled 2023-08-03 (×2): qty 1

## 2023-08-03 MED ORDER — PHENYLEPHRINE 80 MCG/ML (10ML) SYRINGE FOR IV PUSH (FOR BLOOD PRESSURE SUPPORT)
80.0000 ug | PREFILLED_SYRINGE | INTRAVENOUS | Status: DC | PRN
  Filled 2023-08-03: qty 10

## 2023-08-03 NOTE — Progress Notes (Addendum)
 Hypoglycemic Event  CBG: 69  Treatment: 4 oz juice/soda  Symptoms: None  Follow-up CBG: Time:1640 CBG Result:81  Possible Reasons for Event: Other: clear liquid diet for labor  Comments/MD notified:S Butler Potters CNM notified @ 1630    Melissa Gilmore

## 2023-08-03 NOTE — Progress Notes (Signed)
 Labor Progress Note Avryl Roehm is a 28 y.o. G1P0000 at [redacted]w[redacted]d presented for IOL 2/2 DM 2.   S:  Coping well.   O:  BP (!) 139/95   Pulse 85   Temp 98.1 F (36.7 C) (Oral)   Resp 16   Ht 5' 4 (1.626 m)   Wt 113.4 kg   LMP 11/12/2022 (Exact Date)   SpO2 94%   BMI 42.91 kg/m  EFM: baseline 135 bpm/ mod variability/ 15x15 accels/ absent decels  Toco/IUPC: occasional SVE: Dilation: 1 Effacement (%): Thick Station: -3 Presentation: Vertex Exam by:: Camie CNM Pitocin : 0 mu/min  A/P: 28 y.o. G1P0000 [redacted]w[redacted]d IOL 2/2 DM2 1. Labor: IOL in latent phase. In to reassess for Cricket FANG reviewed with patient. Patient consents to procedure, tolerated well. Uterine balloon only filled with 60mL 2. FWB: Cat 1 3. Pain: Coping well.  4. GBS pos tx with PCN   Anticipate NVSB. SABRA Camie DELENA Regino, CNM 12:08 PM

## 2023-08-03 NOTE — Anesthesia Preprocedure Evaluation (Signed)
 Anesthesia Evaluation  Patient identified by MRN, date of birth, ID band Patient awake    Reviewed: Allergy  & Precautions, NPO status , Patient's Chart, lab work & pertinent test results  Airway Mallampati: III  TM Distance: >3 FB Neck ROM: Full    Dental no notable dental hx.    Pulmonary asthma    Pulmonary exam normal breath sounds clear to auscultation       Cardiovascular negative cardio ROS Normal cardiovascular exam Rhythm:Regular Rate:Normal     Neuro/Psych  Headaches PSYCHIATRIC DISORDERS Anxiety Depression  Schizophrenia     GI/Hepatic negative GI ROS, Neg liver ROS,,,  Endo/Other  diabetes, Type 2, Insulin  Dependent  Class 3 obesity (BMI 43)  Renal/GU negative Renal ROS  negative genitourinary   Musculoskeletal negative musculoskeletal ROS (+)    Abdominal   Peds  Hematology negative hematology ROS (+)   Anesthesia Other Findings   Reproductive/Obstetrics (+) Pregnancy                             Anesthesia Physical Anesthesia Plan  ASA: 3  Anesthesia Plan: Epidural   Post-op Pain Management:    Induction:   PONV Risk Score and Plan: Treatment may vary due to age or medical condition  Airway Management Planned: Natural Airway  Additional Equipment:   Intra-op Plan:   Post-operative Plan:   Informed Consent: I have reviewed the patients History and Physical, chart, labs and discussed the procedure including the risks, benefits and alternatives for the proposed anesthesia with the patient or authorized representative who has indicated his/her understanding and acceptance.       Plan Discussed with: Anesthesiologist  Anesthesia Plan Comments: (Patient identified. Risks, benefits, options discussed with patient including but not limited to bleeding, infection, nerve damage, paralysis, failed block, incomplete pain control, headache, blood pressure changes, nausea,  vomiting, reactions to medication, itching, and post partum back pain. Confirmed with bedside nurse the patient's most recent platelet count. Confirmed with the patient that they are not taking any anticoagulation, have any bleeding history or any family history of bleeding disorders. Patient expressed understanding and wishes to proceed. All questions were answered. )       Anesthesia Quick Evaluation

## 2023-08-03 NOTE — Progress Notes (Signed)
 Labor Progress Note Melissa Gilmore is a 28 y.o. G1P0000 at [redacted]w[redacted]d presented for IOL for Type 2 DM.   S:  Coping  O:  BP (!) 149/91   Pulse 71   Temp 98.1 F (36.7 C) (Oral)   Resp 16   Ht 5' 4 (1.626 m)   Wt 113.4 kg   LMP 11/12/2022 (Exact Date)   SpO2 94%   BMI 42.91 kg/m  EFM: baseline 135 bpm/ moderate variability/ 15x15 accels/ absent decels  Toco/IUPC: irregular SVE: Dilation: Closed Effacement (%): Thick Station: -3 Presentation: Vertex Exam by:: Camie Rote CNM Pitocin : 0 mu/min  A/P: 28 y.o. G1P0000 [redacted]w[redacted]d IOL 2/2 Type 2 DM.  1. Labor: IOL in latent phase s/p cytotec . RBA FB d/w patient, with patient consenting to procedure. On SCE, patient's cervix funnels to closed, will try again in a few hours. Last cytotec  0745.   2. FWB: Cat 1 3. Pain: Coping well, epidural on request.  4. GBS, PCN, adequately treated.    Anticipate NVSB.  Camie DELENA Rote, CNM 9:09 AM

## 2023-08-03 NOTE — Anesthesia Procedure Notes (Signed)
 Epidural Patient location during procedure: OB Start time: 08/03/2023 9:35 PM End time: 08/03/2023 9:45 PM  Staffing Anesthesiologist: Niels Marien CROME, MD Performed: anesthesiologist   Preanesthetic Checklist Completed: patient identified, IV checked, risks and benefits discussed, monitors and equipment checked, pre-op evaluation and timeout performed  Epidural Patient position: sitting Prep: DuraPrep and site prepped and draped Patient monitoring: continuous pulse ox, blood pressure, heart rate and cardiac monitor Approach: midline Location: L3-L4 Injection technique: LOR air  Needle:  Needle type: Tuohy  Needle gauge: 17 G Needle length: 9 cm Needle insertion depth: 7 cm Catheter type: closed end flexible Catheter size: 19 Gauge Catheter at skin depth: 12 cm Test dose: negative  Assessment Sensory level: T8 Events: blood not aspirated, no cerebrospinal fluid, injection not painful, no injection resistance, no paresthesia and negative IV test  Additional Notes Patient identified. Risks/Benefits/Options discussed with patient including but not limited to bleeding, infection, nerve damage, paralysis, failed block, incomplete pain control, headache, blood pressure changes, nausea, vomiting, reactions to medication both or allergic, itching and postpartum back pain. Confirmed with bedside nurse the patient's most recent platelet count. Confirmed with patient that they are not currently taking any anticoagulation, have any bleeding history or any family history of bleeding disorders. Patient expressed understanding and wished to proceed. All questions were answered. Sterile technique was used throughout the entire procedure. Please see nursing notes for vital signs. Test dose was given through epidural catheter and negative prior to continuing to dose epidural or start infusion. Warning signs of high block given to the patient including shortness of breath, tingling/numbness in hands,  complete motor block, or any concerning symptoms with instructions to call for help. Patient was given instructions on fall risk and not to get out of bed. All questions and concerns addressed with instructions to call with any issues or inadequate analgesia.    LOR at 7cm but not obvious. Given h/o back surgery and body habitus, confirmatory dural puncture performed with 25G pencan with return of clear CSF. Epidural catheter inserted into epidural space and left at 12cm at skin.  Reason for block:procedure for pain

## 2023-08-03 NOTE — Progress Notes (Signed)
 Patient ID: Melissa Gilmore, female   DOB: July 26, 1995, 28 y.o.   MRN: [id number]  Subjective: -Care assumed of 28 y.o. G1P0 at [redacted]w[redacted]d who presents for IOL d/t GDM. In room to meet acquaintance of patient and family.  Patient resting in bed. Reports some discomfort, but coping well. Endorses fetal movement.   Objective: BP 119/66   Pulse 64   Temp 98 F (36.7 C) (Oral)   Resp 16   Ht 5' 4 (1.626 m)   Wt 113.4 kg   LMP 11/12/2022 (Exact Date)   SpO2 94%   BMI 42.91 kg/m  No intake/output data recorded. No intake/output data recorded.  Fetal Monitoring: FHT: 140 bpm, Mod Var, -Decels, +15x15 Accels UC: None graphed, palpates mild    Physical Exam: General appearance: alert, well appearing, and in no distress. Chest: not examined.  No respiratory distress. Abdominal exam: soft, nontender, nondistended, no masses or organomegaly Gravid, Soft. Extremities: Mild Edema Skin exam: Warm Dry  Vaginal Exam: SVE:   Dilation: 5 Effacement (%): Thick Station: -3 Exam by:: Harlene Duncans, CNM Membranes:AROM with small amt bloody fluid Internal Monitors: None  Augmentation/Induction: Pitocin :Initiated at 2x2 Cytotec : S/P 3 Doses; 1640  Assessment:  IUP at 37.5 weeks Cat I FT  Amniotomy IOL d/t DM2 GBS Positive  Plan: -S/P PCN x 6 -CBG at 2030 77 -Discussed AROM r/b including increased risk of infection, cord prolapse, fetal intolerance, and decreased labor time. No questions or concerns and patient desires to proceed with AROM.  -Initiate pitocin  at 2mUn/min and increase at same rate. -Reviewed pain mgmt and patient would like to proceed with epidural. Encouraged to obtain when desired.   Melissa Gilmore Melissa Witherspoon,MSN, CNM 08/03/2023, 8:50 PM

## 2023-08-04 DIAGNOSIS — Z3A38 38 weeks gestation of pregnancy: Secondary | ICD-10-CM | POA: Diagnosis not present

## 2023-08-04 MED ORDER — OXYTOCIN-SODIUM CHLORIDE 30-0.9 UT/500ML-% IV SOLN: 1.0000 m[IU]/min | INTRAVENOUS | Status: DC

## 2023-08-04 MED ORDER — ACETAMINOPHEN 500 MG PO TABS
1000.0000 mg | ORAL_TABLET | Freq: Once | ORAL | Status: AC
  Administered 2023-08-04: 1000 mg via ORAL
  Filled 2023-08-04: qty 2

## 2023-08-04 MED ORDER — OXYTOCIN-SODIUM CHLORIDE 30-0.9 UT/500ML-% IV SOLN
1.0000 m[IU]/min | INTRAVENOUS | Status: DC
  Administered 2023-08-05: 4 m[IU]/min via INTRAVENOUS

## 2023-08-04 MED ORDER — TERBUTALINE SULFATE 1 MG/ML IJ SOLN: 0.2500 mg | Freq: Once | INTRAMUSCULAR | Status: DC | PRN

## 2023-08-04 MED ORDER — OXYTOCIN-SODIUM CHLORIDE 30-0.9 UT/500ML-% IV SOLN
1.0000 m[IU]/min | INTRAVENOUS | Status: DC
  Administered 2023-08-04: 1 m[IU]/min via INTRAVENOUS

## 2023-08-04 NOTE — Progress Notes (Signed)
 Labor Progress Note Melissa Gilmore is a 28 y.o. G1P0000 at [redacted]w[redacted]d presented for IOL 2/2 A2GDM  S:  Comfortable s/p epidural.   O:  BP 123/75   Pulse (!) 51   Temp (!) 97.5 F (36.4 C) (Oral)   Resp 17   Ht 5' 4 (1.626 m)   Wt 113.4 kg   LMP 11/12/2022 (Exact Date)   SpO2 97%   BMI 42.91 kg/m   EFM: baseline 125 bpm/ moderate variability/ 15x15 accels/ absent decels  Toco/IUPC: q 1-5, MVUs 70-85  SVE: Dilation: 5.5 Effacement (%): 70 Station: -3 Presentation: Vertex Exam by:: Camie Rote, CNM By my exam now she was 5/70/-3 IUPC and FSE in place Pitocin : 10 mu/min  A/P: 28 y.o. G1P0000 [redacted]w[redacted]d  1. Labor: IOL in latent phase no change from previous exam.  Continue to titrate pitocin  up for adequate MVUs not consistently adequate. Has been this exam x10 hours.   2. FWB: Cat 1  3. Pain: Comfortable s/p epidural. 4. GBS pos with Tx   Anticipate NVSB.  Vina Solian, MD 10:19 PM

## 2023-08-04 NOTE — Progress Notes (Signed)
 Patient ID: Melissa Gilmore, female   DOB: 02-Mar-1995, 28 y.o.   MRN: [id number]   Subjective: -Patient sitting in high fowlers. Report rectal pressure. Family at bedside.   Objective: BP (!) 111/59   Pulse 75   Temp 97.6 F (36.4 C) (Oral)   Resp 16   Ht 5' 4 (1.626 m)   Wt 113.4 kg   LMP 11/12/2022 (Exact Date)   SpO2 94%   BMI 42.91 kg/m  No intake/output data recorded. No intake/output data recorded.  Fetal Monitoring: FHT: 155 bpm, Mod Var, -Decels, +Accels UC: None graphed    Vaginal Exam: SVE:   Dilation: 5 Effacement (%): Thick Station: -3 Exam by:: Harlene Duncans, CNM Membranes:AROM x 4 hours Internal Monitors: IUPC inserted  Augmentation/Induction: Pitocin :58mUn/min Cytotec : S/P  Assessment:  IUP at 37.6 weeks Cat I FT  IOL  Plan: -Discussed IUPC r/b, prior to insertion, including increased risk of infection and ability to adequately monitor quantity and strength of contractions. -Patient agreeable and monitor placed without incident. -Start titration of pitocin  as appropriate. -Patient encouraged to rest.    Harlene LITTIE Duncans LAFE, CNM Advanced Practice Provider, Center for [facility] 08/04/2023, 1:36 AM

## 2023-08-04 NOTE — Progress Notes (Signed)
 Labor Progress Note Melissa Gilmore is a 28 y.o. G1P0000 at [redacted]w[redacted]d presented for IOL 2/2 A2GDM  S:  Comfortable s/p epidural  O:  BP 106/69   Pulse (!) 58   Temp 98.2 F (36.8 C) (Oral)   Resp 18   Ht 5' 4 (1.626 m)   Wt 113.4 kg   LMP 11/12/2022 (Exact Date)   SpO2 97%   BMI 42.91 kg/m   EFM: baseline 145 bpm/ moderate variability/ 15x15 accels/ absent decels  Toco/IUPC: 3-8 SVE: Dilation: 5 Effacement (%): 70 Station: -3 Presentation: Vertex Exam by:: Camie Lowers- Hill CNM Pitocin : 0 mu/min  A/P: 28 y.o. G1P0000 [redacted]w[redacted]d  1. Labor: IOL in latent phase, SCE unchanged. Will restart pitocin  1x1 and hold at 4.  Pitocin  previously turned off due to fetal heart tracing.  2. FWB: Cat 1 3. Pain: Comfortable s/p epidural 4. GBS pos with adequate tx.    Anticipate NVSB.  Camie DELENA Rote, CNM 11:53 AM

## 2023-08-04 NOTE — Progress Notes (Signed)
 Labor Progress Note Jaleya Pebley is a 28 y.o. G1P0000 at [redacted]w[redacted]d presented for IOL 2/2 A2GDM  S:  Comfortable s/p epidural.   O:  BP 92/61   Pulse (!) 56   Temp 97.9 F (36.6 C) (Oral)   Resp 18   Ht 5' 4 (1.626 m)   Wt 113.4 kg   LMP 11/12/2022 (Exact Date)   SpO2 97%   BMI 42.91 kg/m   EFM: baseline 125 bpm/ moderate variability/ 15x15 accels/ absent decels  Toco/IUPC: q 1-5, MVUs 70-85  SVE: Dilation: 5.5 Effacement (%): 70 Station: -3 Presentation: Vertex Exam by:: Camie Rote, CNM Pitocin : 6 mu/min  A/P: 28 y.o. G1P0000 [redacted]w[redacted]d  1. Labor: IOL in latent phase minimal change noted from previous exam. Continue to titrate pitocin  up for adequate MVUs, has not yet been adequate.  2. FWB: Cat 1  3. Pain: Comfortable s/p epidural. 4. GBS pos with Tx   Anticipate NVSB.  Camie DELENA Rote, CNM 4:51 PM

## 2023-08-05 ENCOUNTER — Encounter (HOSPITAL_COMMUNITY)
Admission: AD | Disposition: A | Discharge status: Home / Self Care | Payer: Self-pay | Source: Home / Self Care | Attending: Obstetrics and Gynecology

## 2023-08-05 ENCOUNTER — Encounter (HOSPITAL_COMMUNITY): Payer: Self-pay | Admitting: Family Medicine

## 2023-08-05 ENCOUNTER — Other Ambulatory Visit: Payer: Self-pay

## 2023-08-05 DIAGNOSIS — O24113 Pre-existing diabetes mellitus, type 2, in pregnancy, third trimester: Secondary | ICD-10-CM

## 2023-08-05 DIAGNOSIS — Z3A37 37 weeks gestation of pregnancy: Secondary | ICD-10-CM

## 2023-08-05 DIAGNOSIS — Z98891 History of uterine scar from previous surgery: Principal | ICD-10-CM

## 2023-08-05 DIAGNOSIS — O133 Gestational [pregnancy-induced] hypertension without significant proteinuria, third trimester: Secondary | ICD-10-CM

## 2023-08-05 DIAGNOSIS — O99820 Streptococcus B carrier state complicating pregnancy: Secondary | ICD-10-CM

## 2023-08-05 LAB — GLUCOSE, CAPILLARY
Glucose-Capillary: 85 mg/dL (ref 70–99)
Glucose-Capillary: 166 mg/dL — ABNORMAL HIGH (ref 70–99)
Glucose-Capillary: 176 mg/dL — ABNORMAL HIGH (ref 70–99)

## 2023-08-05 LAB — CBC
HCT: 38.6 % (ref 36.0–46.0)
Hemoglobin: 12.6 g/dL (ref 12.0–15.0)
MCH: 26.5 pg (ref 26.0–34.0)
MCHC: 32.6 g/dL (ref 30.0–36.0)
MCV: 81.3 fL (ref 80.0–100.0)
Platelets: 292 10*3/uL (ref 150–400)
RBC: 4.75 MIL/uL (ref 3.87–5.11)
RDW: 13.3 % (ref 11.5–15.5)
WBC: 18.5 10*3/uL — ABNORMAL HIGH (ref 4.0–10.5)
nRBC: 0 % (ref 0.0–0.2)

## 2023-08-05 SURGERY — Surgical Case
Anesthesia: Epidural

## 2023-08-05 MED ORDER — FENTANYL CITRATE (PF) 100 MCG/2ML IJ SOLN: 25.0000 ug | INTRAMUSCULAR | Status: DC | PRN

## 2023-08-05 MED ORDER — LIDOCAINE-EPINEPHRINE (PF) 2 %-1:200000 IJ SOLN
INTRAMUSCULAR | Status: DC | PRN
  Administered 2023-08-05 (×2): 5 mL via EPIDURAL

## 2023-08-05 MED ORDER — MORPHINE SULFATE (PF) 0.5 MG/ML IJ SOLN
INTRAMUSCULAR | Status: DC | PRN
  Administered 2023-08-05: 3 mg via EPIDURAL

## 2023-08-05 MED ORDER — GABAPENTIN 100 MG PO CAPS
100.0000 mg | ORAL_CAPSULE | Freq: Three times a day (TID) | ORAL | Status: DC
  Administered 2023-08-05 – 2023-08-07 (×6): 100 mg via ORAL
  Filled 2023-08-05 (×6): qty 1

## 2023-08-05 MED ORDER — MENTHOL 3 MG MT LOZG
1.0000 | LOZENGE | OROMUCOSAL | Status: DC | PRN
Start: 2023-08-05 — End: 2023-08-07

## 2023-08-05 MED ORDER — SCOPOLAMINE 1 MG/3DAYS TD PT72: 1.0000 | MEDICATED_PATCH | Freq: Once | TRANSDERMAL | Status: DC

## 2023-08-05 MED ORDER — OXYCODONE HCL 5 MG PO TABS: 5.0000 mg | ORAL_TABLET | Freq: Once | ORAL | Status: DC | PRN

## 2023-08-05 MED ORDER — DEXMEDETOMIDINE HCL IN NACL 80 MCG/20ML IV SOLN
INTRAVENOUS | Status: DC | PRN
  Administered 2023-08-05: 8 ug via INTRAVENOUS

## 2023-08-05 MED ORDER — DIPHENHYDRAMINE HCL 25 MG PO CAPS
25.0000 mg | ORAL_CAPSULE | ORAL | Status: DC | PRN
  Administered 2023-08-05: 25 mg via ORAL
  Filled 2023-08-05: qty 1

## 2023-08-05 MED ORDER — PNEUMOCOCCAL 20-VAL CONJ VACC 0.5 ML IM SUSY
0.5000 mL | PREFILLED_SYRINGE | INTRAMUSCULAR | Status: AC
  Administered 2023-08-06: 0.5 mL via INTRAMUSCULAR
  Filled 2023-08-05: qty 0.5

## 2023-08-05 MED ORDER — NALOXONE HCL 0.4 MG/ML IJ SOLN: 0.4000 mg | INTRAMUSCULAR | Status: DC | PRN

## 2023-08-05 MED ORDER — DIPHENHYDRAMINE HCL 25 MG PO CAPS: 25.0000 mg | ORAL_CAPSULE | Freq: Four times a day (QID) | ORAL | Status: DC | PRN

## 2023-08-05 MED ORDER — PRENATAL MULTIVITAMIN CH
1.0000 | ORAL_TABLET | Freq: Every day | ORAL | Status: DC
  Administered 2023-08-06 – 2023-08-07 (×2): 1 via ORAL
  Filled 2023-08-05 (×2): qty 1

## 2023-08-05 MED ORDER — SODIUM CHLORIDE 0.9 % IR SOLN
Status: DC | PRN
  Administered 2023-08-05: 1

## 2023-08-05 MED ORDER — OXYCODONE HCL 5 MG PO TABS
5.0000 mg | ORAL_TABLET | ORAL | Status: DC | PRN
  Administered 2023-08-06: 5 mg via ORAL
  Administered 2023-08-07: 10 mg via ORAL
  Administered 2023-08-07: 5 mg via ORAL
  Filled 2023-08-05: qty 2
  Filled 2023-08-05 (×2): qty 1

## 2023-08-05 MED ORDER — CEFAZOLIN SODIUM-DEXTROSE 2-3 GM-%(50ML) IV SOLR
INTRAVENOUS | Status: DC | PRN
  Administered 2023-08-05: 2 g via INTRAVENOUS

## 2023-08-05 MED ORDER — INSULIN GLARGINE-YFGN 100 UNIT/ML ~~LOC~~ SOLN
15.0000 [IU] | SUBCUTANEOUS | Status: DC
  Administered 2023-08-05: 15 [IU] via SUBCUTANEOUS
  Filled 2023-08-05 (×2): qty 0.15

## 2023-08-05 MED ORDER — FUROSEMIDE 20 MG PO TABS
40.0000 mg | ORAL_TABLET | Freq: Every day | ORAL | Status: DC
  Administered 2023-08-06 – 2023-08-07 (×2): 40 mg via ORAL
  Filled 2023-08-05 (×2): qty 2

## 2023-08-05 MED ORDER — LACTATED RINGERS IV SOLN: INTRAVENOUS | Status: DC | PRN

## 2023-08-05 MED ORDER — PHENYLEPHRINE 80 MCG/ML (10ML) SYRINGE FOR IV PUSH (FOR BLOOD PRESSURE SUPPORT)
PREFILLED_SYRINGE | INTRAVENOUS | Status: DC | PRN
  Administered 2023-08-05: 160 ug via INTRAVENOUS
  Administered 2023-08-05: 80 ug via INTRAVENOUS

## 2023-08-05 MED ORDER — IBUPROFEN 800 MG PO TABS
800.0000 mg | ORAL_TABLET | Freq: Three times a day (TID) | ORAL | Status: DC
  Administered 2023-08-06 – 2023-08-07 (×4): 800 mg via ORAL
  Filled 2023-08-05 (×4): qty 1

## 2023-08-05 MED ORDER — STERILE WATER FOR IRRIGATION IR SOLN
Status: DC | PRN
  Administered 2023-08-05: 1

## 2023-08-05 MED ORDER — WITCH HAZEL-GLYCERIN EX PADS: 1.0000 | MEDICATED_PAD | CUTANEOUS | Status: DC | PRN

## 2023-08-05 MED ORDER — ACETAMINOPHEN 10 MG/ML IV SOLN
INTRAVENOUS | Status: DC | PRN
  Administered 2023-08-05: 1000 mg via INTRAVENOUS

## 2023-08-05 MED ORDER — NIFEDIPINE ER OSMOTIC RELEASE 30 MG PO TB24
30.0000 mg | ORAL_TABLET | Freq: Every day | ORAL | Status: DC
  Administered 2023-08-05 – 2023-08-07 (×3): 30 mg via ORAL
  Filled 2023-08-05 (×3): qty 1

## 2023-08-05 MED ORDER — DIPHENHYDRAMINE HCL 50 MG/ML IJ SOLN: 12.5000 mg | INTRAMUSCULAR | Status: DC | PRN

## 2023-08-05 MED ORDER — DIBUCAINE (PERIANAL) 1 % EX OINT: 1.0000 | TOPICAL_OINTMENT | CUTANEOUS | Status: DC | PRN

## 2023-08-05 MED ORDER — INSULIN ASPART 100 UNIT/ML IJ SOLN
0.0000 [IU] | Freq: Three times a day (TID) | INTRAMUSCULAR | Status: DC
  Administered 2023-08-05 – 2023-08-06 (×2): 3 [IU] via SUBCUTANEOUS

## 2023-08-05 MED ORDER — FENTANYL CITRATE (PF) 100 MCG/2ML IJ SOLN
INTRAMUSCULAR | Status: DC | PRN
  Administered 2023-08-05: 100 ug via EPIDURAL

## 2023-08-05 MED ORDER — KETOROLAC TROMETHAMINE 30 MG/ML IJ SOLN
30.0000 mg | Freq: Four times a day (QID) | INTRAMUSCULAR | Status: AC
  Administered 2023-08-05 – 2023-08-06 (×3): 30 mg via INTRAVENOUS
  Filled 2023-08-05 (×3): qty 1

## 2023-08-05 MED ORDER — SODIUM CHLORIDE 0.9% FLUSH: 3.0000 mL | INTRAVENOUS | Status: DC | PRN

## 2023-08-05 MED ORDER — EPHEDRINE SULFATE-NACL 50-0.9 MG/10ML-% IV SOSY
PREFILLED_SYRINGE | INTRAVENOUS | Status: DC | PRN
  Administered 2023-08-05: 10 mg via INTRAVENOUS

## 2023-08-05 MED ORDER — TRANEXAMIC ACID-NACL 1000-0.7 MG/100ML-% IV SOLN
INTRAVENOUS | Status: DC | PRN
  Administered 2023-08-05: 1000 mg via INTRAVENOUS

## 2023-08-05 MED ORDER — ZOLPIDEM TARTRATE 5 MG PO TABS: 5.0000 mg | ORAL_TABLET | Freq: Every evening | ORAL | Status: DC | PRN

## 2023-08-05 MED ORDER — MEASLES, MUMPS & RUBELLA VAC IJ SOLR
0.5000 mL | Freq: Once | INTRAMUSCULAR | Status: AC
Start: 2023-08-06 — End: 2023-08-07
  Administered 2023-08-07: 0.5 mL via SUBCUTANEOUS
  Filled 2023-08-05: qty 0.5

## 2023-08-05 MED ORDER — NALOXONE HCL 4 MG/10ML IJ SOLN: 1.0000 ug/kg/h | INTRAVENOUS | Status: DC | PRN

## 2023-08-05 MED ORDER — METHYLERGONOVINE MALEATE 0.2 MG/ML IJ SOLN
INTRAMUSCULAR | Status: DC | PRN
  Administered 2023-08-05: .2 mg via INTRAMUSCULAR

## 2023-08-05 MED ORDER — ACETAMINOPHEN 500 MG PO TABS: 1000.0000 mg | ORAL_TABLET | Freq: Four times a day (QID) | ORAL | Status: DC

## 2023-08-05 MED ORDER — SODIUM CHLORIDE 0.9 % IV SOLN
INTRAVENOUS | Status: DC | PRN
  Administered 2023-08-05: 500 mg via INTRAVENOUS

## 2023-08-05 MED ORDER — ONDANSETRON HCL 4 MG/2ML IJ SOLN: 4.0000 mg | Freq: Three times a day (TID) | INTRAMUSCULAR | Status: DC | PRN

## 2023-08-05 MED ORDER — ACETAMINOPHEN 500 MG PO TABS
1000.0000 mg | ORAL_TABLET | Freq: Three times a day (TID) | ORAL | Status: DC
  Administered 2023-08-05 – 2023-08-07 (×6): 1000 mg via ORAL
  Filled 2023-08-05 (×6): qty 2

## 2023-08-05 MED ORDER — POTASSIUM CHLORIDE CRYS ER 20 MEQ PO TBCR
20.0000 meq | EXTENDED_RELEASE_TABLET | Freq: Every day | ORAL | Status: DC
  Administered 2023-08-06 – 2023-08-07 (×2): 20 meq via ORAL
  Filled 2023-08-05 (×2): qty 1

## 2023-08-05 MED ORDER — FENTANYL CITRATE (PF) 100 MCG/2ML IJ SOLN
INTRAMUSCULAR | Status: AC
  Filled 2023-08-05: qty 2

## 2023-08-05 MED ORDER — DEXAMETHASONE SODIUM PHOSPHATE 10 MG/ML IJ SOLN
INTRAMUSCULAR | Status: DC | PRN
  Administered 2023-08-05: 10 mg via INTRAVENOUS

## 2023-08-05 MED ORDER — FAMOTIDINE 20 MG PO TABS: 20.0000 mg | ORAL_TABLET | Freq: Two times a day (BID) | ORAL | Status: DC | PRN

## 2023-08-05 MED ORDER — SIMETHICONE 80 MG PO CHEW
80.0000 mg | CHEWABLE_TABLET | Freq: Three times a day (TID) | ORAL | Status: DC
  Administered 2023-08-05 – 2023-08-07 (×6): 80 mg via ORAL
  Filled 2023-08-05 (×6): qty 1

## 2023-08-05 MED ORDER — MEDROXYPROGESTERONE ACETATE 150 MG/ML IM SUSP
150.0000 mg | INTRAMUSCULAR | Status: DC | PRN
Start: 2023-08-05 — End: 2023-08-07

## 2023-08-05 MED ORDER — COCONUT OIL OIL: 1.0000 | TOPICAL_OIL | Status: DC | PRN

## 2023-08-05 MED ORDER — KETOROLAC TROMETHAMINE 30 MG/ML IJ SOLN
30.0000 mg | Freq: Four times a day (QID) | INTRAMUSCULAR | Status: AC | PRN
Start: 2023-08-05 — End: 2023-08-06

## 2023-08-05 MED ORDER — MISOPROSTOL 25 MCG QUARTER TABLET
ORAL_TABLET | ORAL | Status: DC | PRN
Start: 2023-08-05 — End: 2023-08-05
  Administered 2023-08-05: 800 ug via BUCCAL

## 2023-08-05 MED ORDER — KETOROLAC TROMETHAMINE 30 MG/ML IJ SOLN
30.0000 mg | Freq: Four times a day (QID) | INTRAMUSCULAR | Status: AC | PRN
  Administered 2023-08-05: 30 mg via INTRAMUSCULAR
  Filled 2023-08-05: qty 1

## 2023-08-05 MED ORDER — ONDANSETRON HCL 4 MG/2ML IJ SOLN
INTRAMUSCULAR | Status: DC | PRN
  Administered 2023-08-05: 4 mg via INTRAVENOUS

## 2023-08-05 MED ORDER — ENOXAPARIN SODIUM 40 MG/0.4ML IJ SOSY
40.0000 mg | PREFILLED_SYRINGE | INTRAMUSCULAR | Status: DC
  Administered 2023-08-06 – 2023-08-07 (×2): 40 mg via SUBCUTANEOUS
  Filled 2023-08-05 (×2): qty 0.4

## 2023-08-05 MED ORDER — SENNOSIDES-DOCUSATE SODIUM 8.6-50 MG PO TABS
2.0000 | ORAL_TABLET | Freq: Every day | ORAL | Status: DC
  Administered 2023-08-06 – 2023-08-07 (×2): 2 via ORAL
  Filled 2023-08-05 (×2): qty 2

## 2023-08-05 MED ORDER — OXYTOCIN-SODIUM CHLORIDE 30-0.9 UT/500ML-% IV SOLN
2.5000 [IU]/h | INTRAVENOUS | Status: AC
  Administered 2023-08-05: 2.5 [IU]/h via INTRAVENOUS
  Filled 2023-08-05: qty 500

## 2023-08-05 MED ORDER — OXYTOCIN-SODIUM CHLORIDE 30-0.9 UT/500ML-% IV SOLN
INTRAVENOUS | Status: DC | PRN
  Administered 2023-08-05: 300 mL via INTRAVENOUS

## 2023-08-05 MED ORDER — KETOROLAC TROMETHAMINE 30 MG/ML IJ SOLN: 30.0000 mg | Freq: Once | INTRAMUSCULAR | Status: DC | PRN

## 2023-08-05 MED ORDER — MORPHINE SULFATE (PF) 0.5 MG/ML IJ SOLN
INTRAMUSCULAR | Status: AC
  Filled 2023-08-05: qty 10

## 2023-08-05 MED ORDER — PHENYLEPHRINE HCL-NACL 20-0.9 MG/250ML-% IV SOLN
INTRAVENOUS | Status: DC | PRN
  Administered 2023-08-05: 40 ug/min via INTRAVENOUS

## 2023-08-05 MED ORDER — SIMETHICONE 80 MG PO CHEW: 80.0000 mg | CHEWABLE_TABLET | ORAL | Status: DC | PRN

## 2023-08-05 MED ORDER — OXYCODONE HCL 5 MG/5ML PO SOLN: 5.0000 mg | Freq: Once | ORAL | Status: DC | PRN

## 2023-08-05 SURGICAL SUPPLY — 36 items
BENZOIN TINCTURE PRP APPL 2/3 (GAUZE/BANDAGES/DRESSINGS) IMPLANT
CANISTER PREVENA PLUS 150 (CANNISTER) IMPLANT
CHLORAPREP W/TINT 26 (MISCELLANEOUS) ×4 IMPLANT
CLAMP UMBILICAL CORD (MISCELLANEOUS) ×2 IMPLANT
CLOTH BEACON ORANGE TIMEOUT ST (SAFETY) ×2 IMPLANT
DERMABOND ADVANCED .7 DNX12 (GAUZE/BANDAGES/DRESSINGS) ×4 IMPLANT
DRESSING PREVENA PLUS CUSTOM (GAUZE/BANDAGES/DRESSINGS) IMPLANT
DRSG OPSITE POSTOP 4X10 (GAUZE/BANDAGES/DRESSINGS) ×2 IMPLANT
ELECTRODE REM PT RTRN 9FT ADLT (ELECTROSURGICAL) ×2 IMPLANT
EXTRACTOR VACUUM BELL STYLE (SUCTIONS) IMPLANT
GLOVE BIOGEL PI IND STRL 7.0 (GLOVE) ×2 IMPLANT
GLOVE BIOGEL PI IND STRL 8 (GLOVE) ×2 IMPLANT
GLOVE ECLIPSE 8.0 STRL XLNG CF (GLOVE) ×2 IMPLANT
GOWN STRL REUS W/TWL LRG LVL3 (GOWN DISPOSABLE) ×4 IMPLANT
KIT ABG SYR 3ML LUER SLIP (SYRINGE) ×2 IMPLANT
MAT PREVALON FULL STRYKER (MISCELLANEOUS) IMPLANT
NDL HYPO 18GX1.5 BLUNT FILL (NEEDLE) ×2 IMPLANT
NDL HYPO 25X5/8 SAFETYGLIDE (NEEDLE) ×2 IMPLANT
NEEDLE HYPO 18GX1.5 BLUNT FILL (NEEDLE) ×1 IMPLANT
NEEDLE HYPO 22GX1.5 SAFETY (NEEDLE) ×2 IMPLANT
NEEDLE HYPO 25X5/8 SAFETYGLIDE (NEEDLE) ×1 IMPLANT
NS IRRIG 1000ML POUR BTL (IV SOLUTION) ×2 IMPLANT
PACK C SECTION WH (CUSTOM PROCEDURE TRAY) ×2 IMPLANT
PAD OB MATERNITY 4.3X12.25 (PERSONAL CARE ITEMS) ×2 IMPLANT
RETRACTOR TRAXI PANNICULUS (MISCELLANEOUS) IMPLANT
RTRCTR C-SECT PINK 25CM LRG (MISCELLANEOUS) IMPLANT
STRIP CLOSURE SKIN 1/2X4 (GAUZE/BANDAGES/DRESSINGS) IMPLANT
SUT CHROMIC 0 CT 1 (SUTURE) ×2 IMPLANT
SUT MNCRL 0 VIOLET CTX 36 (SUTURE) ×4 IMPLANT
SUT PLAIN 2 0 XLH (SUTURE) IMPLANT
SUT PLAIN ABS 2-0 CT1 27XMFL (SUTURE) IMPLANT
SUT VIC AB 0 CTX36XBRD ANBCTRL (SUTURE) ×2 IMPLANT
SUT VIC AB 4-0 KS 27 (SUTURE) IMPLANT
TOWEL OR 17X24 6PK STRL BLUE (TOWEL DISPOSABLE) ×2 IMPLANT
TRAY FOLEY W/BAG SLVR 14FR LF (SET/KITS/TRAYS/PACK) IMPLANT
WATER STERILE IRR 1000ML POUR (IV SOLUTION) ×2 IMPLANT

## 2023-08-05 NOTE — Transfer of Care (Signed)
 Immediate Anesthesia Transfer of Care Note  Patient: Melissa Gilmore [facility]  Procedure(s) Performed: CESAREAN DELIVERY  Patient Location: PACU  Anesthesia Type:Epidural  Level of Consciousness: awake, alert , and oriented  Airway & Oxygen Therapy: Patient Spontanous Breathing  Post-op Assessment: Report given to RN and Post -op Vital signs reviewed and stable  Post vital signs: Reviewed and stable  Last Vitals:  Vitals Value Taken Time  BP 123/71 08/05/23 11:54  Temp    Pulse 82 08/05/23 11:54  Resp 17 08/05/23 11:54  SpO2 100 % 08/05/23 11:54  Vitals shown include unfiled device data.  Last Pain:  Vitals:   08/05/23 0500  TempSrc: Oral  PainSc:       Patients Stated Pain Goal: 0 (08/04/23 2020)  Complications: No notable events documented.

## 2023-08-05 NOTE — Anesthesia Postprocedure Evaluation (Signed)
 Anesthesia Post Note  Patient: Melissa Gilmore  Procedure(s) Performed: CESAREAN DELIVERY     Patient location during evaluation: PACU Anesthesia Type: Epidural Level of consciousness: oriented and awake and alert Pain management: pain level controlled Vital Signs Assessment: post-procedure vital signs reviewed and stable Respiratory status: spontaneous breathing, respiratory function stable and patient connected to nasal cannula oxygen Cardiovascular status: blood pressure returned to baseline and stable Postop Assessment: no headache, no backache and no apparent nausea or vomiting Anesthetic complications: no  No notable events documented.  Last Vitals:  Vitals:   08/05/23 1620 08/05/23 2047  BP: 138/85 (!) 131/90  Pulse: 94 77  Resp: 16 17  Temp: 37.2 C 36.8 C  SpO2: 97%     Last Pain:  Vitals:   08/05/23 2047  TempSrc: Oral  PainSc: 0-No pain   Pain Goal: Patients Stated Pain Goal: 3 (08/05/23 1834)                 Marien L [name]

## 2023-08-05 NOTE — Op Note (Signed)
 Cesarean Section Operative Note   Patient: Melissa Gilmore [facility]  Date of Procedure: 08/02/2023 - 08/05/2023  Procedure: Primary Low Transverse Cesarean   Indications: failure to progress: arrest of dilation at 5 cm  Pre-operative Diagnosis: failure to progress.   Post-operative Diagnosis: Same  TOLAC Candidate: Yes   Surgeon: Surgeons and Role:    * Jayne Vonn DEL, MD - Primary    * Nicholaus Almarie HERO, MD - Fellow  Assistants: An experienced assistant was required given the standard of surgical care given the complexity of the case.  This assistant was needed for exposure, dissection, suctioning, retraction, instrument exchange, assisting with delivery with administration of fundal pressure, and for overall help during the procedure.   Anesthesia: epidural  Anesthesiologist: Niels Marien CROME, MD   Antibiotics: Cefazolin  and Azithromycin    Estimated Blood Loss: 700 ml   Total IV Fluids: 1300 ml  Urine Output: 150 cc OF clear urine  Specimens: Placenta to pathology  Complications: no complications   Indications: Melissa Gilmore is a 28 y.o. G1P1001 with an IUP [redacted]w[redacted]d presenting for unscheduled, urgent cesarean secondary to the indications listed above. Clinical course notable for IOL for T2DM and new gHTN. Remained unchanged for 12+ hours at 5 cm.  The risks of cesarean section discussed with the patient included but were not limited to: bleeding which may require transfusion or reoperation; infection which may require antibiotics; injury to bowel, bladder, ureters or other surrounding organs; injury to the fetus; need for additional procedures including hysterectomy in the event of a life-threatening hemorrhage; placental abnormalities with subsequent pregnancies, incisional problems, thromboembolic phenomenon and other postoperative/anesthesia complications. The patient concurred with the proposed plan, giving informed written consent for the procedure. Patient  NPO status waived given urgency of case. Anesthesia and OR aware. Preoperative prophylactic antibiotics and SCDs ordered on call to the OR.   Findings: Viable infant in cephalic presentation, no nuchal cord present. Notably ROP and asynclitic. Apgars 5, 7, 7. Weight 3470 g. Clear amniotic fluid. Normal placenta, three vessel cord. Normal uterus (with large veins over lower uterine segment), Normal bilateral fallopian tubes, Normal bilateral ovaries. No adhesive disease was encountered.  Procedure Details: A Time Out was held and the above information confirmed. The patient received intravenous antibiotics and had sequential compression devices applied to her lower extremities preoperatively. The patient was taken back to the operative suite where epidural anesthesia was dosed to a surgical level. After induction of anesthesia, the patient was draped and prepped in the usual sterile manner and placed in a dorsal supine position with a leftward tilt. A low transverse skin incision was made with scalpel and carried down through the subcutaneous tissue to the fascia. Fascial incision was made and extended transversely. The fascia was separated from the underlying rectus tissue superiorly and inferiorly. The rectus muscles were separated in the midline bluntly and the peritoneum was entered bluntly. An Alexis retractor was placed to aid in visualization of the uterus. A bladder flap was not developed. A low transverse uterine incision was made. The infant was successfully delivered from cephalic presentation, the umbilical cord was clamped after 30 seconds given heavy maternal bleeding from hysterotomy. Cord ph was not sent, and cord blood was obtained for evaluation. The placenta was removed Intact and appeared normal. Uterus exteriorized. TXA, methergine, cytotec  given with improvement in uterine tone. The uterine incision was closed with a single layer running locked suture of 0-Monocryl. Closure of hysterotomy  started on left side and closed  to halfway. Then started from right side in order to obtain hemostasis quicker. Overall, excellent hemostasis was noted. The abdomen and the pelvis were cleared of all clot and debris. Irrigation performed to ensure clearance of all clot. the Thersia was removed. Hemostasis was confirmed on all surfaces.  The peritoneum was reapproximated using 2-0 vicryl . The fascia was then closed using 0-Vicryl in a running fashion. The subcutaneous layer was reapproximated with 2-0 plain gut suture. The skin was closed with a 4-0 vicryl subcuticular stitch. Prevena dressing placed. The patient tolerated the procedure well. Sponge, lap, instrument and needle counts were correct x 2. She was taken to the recovery room in stable condition.  Disposition: PACU - hemodynamically stable.    Signed: Almarie CHRISTELLA Moats, MD OB Fellow, [facility] for [facility], [facility]

## 2023-08-05 NOTE — Discharge Summary (Signed)
 Postpartum Discharge Summary  Date of Service updated***     Patient Name: Melissa Gilmore DOB: 02-28-1995 MRN: [id number]  Date of admission: 08/02/2023 Delivery date:08/05/2023 Delivering provider: JAYNE VONN DEL Date of discharge: 08/05/2023  Admitting diagnosis: Type 2 diabetes mellitus during pregnancy [O24.119] Intrauterine pregnancy: [redacted]w[redacted]d     Secondary diagnosis:  Principal Problem:   Status post primary low transverse cesarean section Active Problems:   Schizophrenia (HCC)   Supervision of high risk pregnancy, antepartum   Abnormal antenatal AFP screen   BMI 40.0-44.9, adult (HCC)   GBS (group B Streptococcus carrier), +RV culture, currently pregnant   Type 2 diabetes mellitus during pregnancy   Gestational hypertension  Additional problems: ***    Discharge diagnosis: Term Pregnancy Delivered, Gestational Hypertension, and Type 2 DM                                              Post partum procedures:{Postpartum procedures:23558} Augmentation: AROM, Pitocin , Cytotec , and IP Foley Complications: None  Hospital course: Induction of Labor With Cesarean Section   28 y.o. yo G1P1001 at [redacted]w[redacted]d was admitted to the hospital 08/02/2023 for induction of labor. Patient had a labor course significant for arrest of dilation at 5 cm. The patient went for cesarean section due to failure to progress. Delivery details are as follows: Membrane Rupture Time/Date: 9:00 PM,08/03/2023  Delivery Method:C-Section, Low Transverse Operative Delivery:N/A Details of operation can be found in separate operative Note.  Patient had a postpartum course complicated by***. She is ambulating, tolerating a regular diet, passing flatus, and urinating well.  Patient is discharged home in stable condition on 08/05/23.      Newborn Data: Birth date:08/05/2023 Birth time:10:35 AM Gender:Female Living status:Living Apgars:5 ,7  Weight:3470 g                               Magnesium  Sulfate  received: No BMZ received: No Rhophylac:No MMR:No T-DaP:Given prenatally Flu: N/A RSV Vaccine received: No Transfusion:{Transfusion received:30440034}  Immunizations received: Immunization History  Administered Date(s) Administered   Influenza, Seasonal, Injecte, Preservative Fre 12/12/2022   Influenza,inj,Quad PF,6+ Mos 02/27/2019, 12/03/2019, 12/15/2021   PFIZER(Purple Top)SARS-COV-2 Vaccination 06/18/2019, 07/09/2019    Physical exam  Vitals:   08/05/23 0831 08/05/23 0901 08/05/23 0940 08/05/23 0941  BP: (!) 142/89 (!) 150/103  (!) 153/86  Pulse: (!) 110 95  86  Resp:  16 16   Temp:      TempSrc:      SpO2:      Weight:      Height:       General: {Exam; general:21111117} Lochia: {Desc; appropriate/inappropriate:30686::appropriate} Uterine Fundus: {Desc; firm/soft:30687} Incision: {Exam; incision:21111123} DVT Evaluation: {Exam; dvt:2111122} Labs: Lab Results  Component Value Date   WBC 18.5 (H) 08/05/2023   HGB 12.6 08/05/2023   HCT 38.6 08/05/2023   MCV 81.3 08/05/2023   PLT 292 08/05/2023      Latest Ref Rng & Units 08/02/2023    9:26 PM  CMP  Glucose 70 - 99 mg/dL 789   BUN 6 - 20 mg/dL 9   Creatinine 9.55 - 8.99 mg/dL 9.25   Sodium 864 - 854 mmol/L 134   Potassium 3.5 - 5.1 mmol/L 3.6   Chloride 98 - 111 mmol/L 103   CO2 22 - 32 mmol/L  15   Calcium  8.9 - 10.3 mg/dL 8.8   Total Protein 6.5 - 8.1 g/dL 6.6   Total Bilirubin 0.0 - 1.2 mg/dL 0.5   Alkaline Phos 38 - 126 U/L 209   AST 15 - 41 U/L 47   ALT 0 - 44 U/L 44    Edinburgh Score:     No data to display         No data recorded  After visit meds:  Allergies as of 08/05/2023   No Known Allergies   Med Rec must be completed prior to using this [facility]***        Discharge home in stable condition Infant Feeding: {Baby feeding:23562} Infant Disposition:{CHL IP OB HOME WITH FNUYZM:76418} Discharge instruction: per After Visit Summary and Postpartum booklet. Activity: Advance  as tolerated. Pelvic rest for 6 weeks.  Diet: {OB ipzu:78888878} Future Appointments: Future Appointments  Date Time Provider Department Center  05/07/2024  3:30 PM Tobie Arleta SQUIBB, MD [facility] None   Follow up Visit:  Message sent to [facility] 6/29  Please schedule this patient for a In person postpartum visit in 6 weeks with the following provider: Any provider. Additional Postpartum F/U:Incision check 1 week and BP check 1 week  High risk pregnancy complicated by: T2DM Delivery mode:  C-Section, Low Transverse Anticipated Birth Control:  Unsure   08/05/2023 Almarie CHRISTELLA Moats, MD

## 2023-08-05 NOTE — Progress Notes (Signed)
 Discussed with patient the importance of getting out of bed and increasing activity early upon meeting her for the first time around 1530. Planned with patient that once she had better movement in her legs and has eaten, this RN would get her out of bed and ambulating to the bathroom. Patient now has family bringing her food (first meal since delivery). Patient stating she would like to wait to ambulate because her left leg is still numb from the knee up to her thigh, and she would like to eat first. Patient does not feel comfortable ambulating just yet. Also encouraged patient to call prior to eating in order to obtain glucose per order. Patient verbalized understanding. Tammy, Steva [place]

## 2023-08-05 NOTE — Lactation Note (Signed)
 This note was copied from a baby's chart. Lactation Consultation Note  Patient Name: Melissa Gilmore Today's Date: 08/05/2023 Age:27 hours Reason for consult: Initial assessment;1st time breastfeeding;Primapara  P1, 38 wks, @ 7 hrs of life. Mom requests latching assist, with LC arrival mom emotional. Reassured mom about small amounts of colostrum/feeding need initially. Mom has DEBP set up @ bedside  (by bedside RN) and formula from previous feed @ bedside. Discussed spoon as baby belly size, expected to eat about every 3 hrs. Mom has several flight of ideas and many questions during visit. Demonstrated starting with hand expression- great colostrum! Infant latches and suckles for several minutes. Demonstrated swaddling , hand expression onto spoon- easily collected 4ml- and fed to baby. Encouraged mom to call for assist, LC available overnight as well.  Discussed expectations @ breast- Day 1- sleepy/ feed every 3 hrs/ even 10 minutes is okay, Day 2 more awake/ feeding cues/longer feeds, and cluster feeding overnights brings milk in. Highlighted breast stimulation is tied directly to milk production. Discussed hands on breast and baby, keeping baby awake @ breast. Starting with hand expression & breast compression to get baby working @ breast, and gentle stimulation to keep baby working @ breast. Encouraged EBM for nipple care post feed. LC services and milk storage shared. Encouraged mom to call for assist anytime desired.  Maternal Data Has patient been taught Hand Expression?: Yes Does the patient have breastfeeding experience prior to this delivery?: No  Feeding Mother's Current Feeding Choice: Breast Milk and Formula Nipple Type: Slow - flow  LATCH Score Latch: Grasps breast easily, tongue down, lips flanged, rhythmical sucking.  Audible Swallowing: Spontaneous and intermittent  Type of Nipple: Everted at rest and after stimulation  Comfort (Breast/Nipple): Soft /  non-tender  Hold (Positioning): Full assist, staff holds infant at breast  LATCH Score: 8   Lactation Tools Discussed/Used    Interventions Interventions: Breast feeding basics reviewed;Assisted with latch;Hand express;Breast compression;Education;LC Services brochure;CDC milk storage guidelines  Discharge Pump: Personal;DEBP  Consult Status Consult Status: Follow-up Date: 08/06/23 Follow-up type: In-patient    [facility] 08/05/2023, 6:17 PM

## 2023-08-06 LAB — CBC
HCT: 27.5 % — ABNORMAL LOW (ref 36.0–46.0)
Hemoglobin: 9.2 g/dL — ABNORMAL LOW (ref 12.0–15.0)
MCH: 27.1 pg (ref 26.0–34.0)
MCHC: 33.5 g/dL (ref 30.0–36.0)
MCV: 80.9 fL (ref 80.0–100.0)
Platelets: 203 10*3/uL (ref 150–400)
RBC: 3.4 MIL/uL — ABNORMAL LOW (ref 3.87–5.11)
RDW: 13.3 % (ref 11.5–15.5)
WBC: 13.8 10*3/uL — ABNORMAL HIGH (ref 4.0–10.5)
nRBC: 0 % (ref 0.0–0.2)

## 2023-08-06 LAB — GLUCOSE, CAPILLARY
Glucose-Capillary: 87 mg/dL (ref 70–99)
Glucose-Capillary: 129 mg/dL — ABNORMAL HIGH (ref 70–99)
Glucose-Capillary: 152 mg/dL — ABNORMAL HIGH (ref 70–99)

## 2023-08-06 MED ORDER — FERROUS SULFATE 325 (65 FE) MG PO TABS
325.0000 mg | ORAL_TABLET | ORAL | Status: DC
  Administered 2023-08-06: 325 mg via ORAL
  Filled 2023-08-06: qty 1

## 2023-08-06 MED ORDER — INSULIN GLARGINE-YFGN 100 UNIT/ML ~~LOC~~ SOLN
30.0000 [IU] | Freq: Every day | SUBCUTANEOUS | Status: DC
  Administered 2023-08-06: 30 [IU] via SUBCUTANEOUS
  Filled 2023-08-06 (×2): qty 0.3

## 2023-08-06 MED ORDER — METFORMIN HCL ER 500 MG PO TB24
500.0000 mg | ORAL_TABLET | Freq: Two times a day (BID) | ORAL | Status: DC
  Administered 2023-08-06 – 2023-08-07 (×2): 500 mg via ORAL
  Filled 2023-08-06 (×4): qty 1

## 2023-08-06 NOTE — Progress Notes (Shared)
 POSTPARTUM PROGRESS NOTE  PPD #1  Subjective: Melissa Gilmore is a 28 y.o. G1P1001 s/p LTCS at [redacted]w[redacted]d.  Reports doing better today. No acute events overnight. No issues with ambulating, voiding or po intake. Denies nausea or vomiting. No HA or VC. She has not yet passed flatus. Pain is well controlled.  Lochia is appropriate.  Objective: Vitals:   08/05/23 2340 08/06/23 0520  BP: 131/84 115/73  Pulse: 71 67  Resp: 17 17  Temp: 98 F (36.7 C) 97.9 F (36.6 C)  SpO2:      Physical Exam:  General: alert, cooperative and no distress Chest: no respiratory distress Abdomen: wound vac in place, clean and intact Uterine Fundus: firm and at level of umbilicus Extremities: No calf swelling or tenderness. Normal DP pulses bilaterally  Recent Labs    08/05/23 0848 08/06/23 0430  HGB 12.6 9.2*  HCT 38.6 27.5*    Assessment/Plan: Melissa Gilmore is a 28 y.o. G1P1001 s/p LTCS at [redacted]w[redacted]d for arrest of dilation.  Routine Postpartum Care: Doing well, pain well-controlled.  -- Continue routine care, lactation support  -- Contraception: Undecided -- Feeding: Breast/bottle  #gHTN: BP stable overnight. Cont Procardia, Lasix, K.   #Anemia: Hgb 12.6>9.2 with 709 EBL. Begin PO iron supplementation.   #T2DM: CBGs up to 176 overnight. Cont glargine 15 daily, rSSI with meals. Home diabetes regimen prior to pregnancy included 30u Lantus  daily and Ozempic  1mg  weekly.   Dispo: Plan for discharge 7/1 or 7/2 if mother & baby meeting goals.  Melissa Perkins, MD 08/06/2023 5:38 AM

## 2023-08-06 NOTE — Lactation Note (Signed)
 This note was copied from a baby's chart. Lactation Consultation Note  Patient Name: Melissa Gilmore Today's Date: 08/06/2023 Age:27 hours  Mom is going to call [facility] for next feeding for latch assistance. Gave mom more bottles for pumping. Mom pumped and collected 10 ml. Praises mom.   Maternal Data    Feeding Nipple Type: Slow - flow  LATCH Score                    Lactation Tools Discussed/Used    Interventions    Discharge    Consult Status      [name] 08/06/2023, 12:06 AM

## 2023-08-06 NOTE — Clinical Social Work Maternal (Signed)
 CLINICAL SOCIAL WORK MATERNAL/CHILD NOTE  Patient Details  Name: Melissa Gilmore MRN: [id number] Date of Birth: 07/09/1995  Date:  08/06/2023  Clinical Social Worker Initiating Note:  [name] Date/Time: Initiated:  08/06/23/1252     Child's Name:  Melissa Gilmore   Biological Parents:  Mother, Father Melissa Gilmore 22-Nov-1995 Melissa Gilmore 6-28-)   Need for Interpreter:  None   Reason for Referral:  Behavioral Health Concerns   Address:  [address] [place] [address]    Phone number:  [phone] (home)     Additional phone number:   Household Members/Support Persons (HM/SP):   Household Member/Support Person 1, Household Member/Support Person 2   HM/SP Name Relationship DOB or Age  HM/SP -1 Melissa Gilmore [name]'s mother    HM/SP -2 Melissa Gilmore [name] 08-03-2000  HM/SP -3        HM/SP -4        HM/SP -5        HM/SP -6        HM/SP -7        HM/SP -8          Natural Supports (not living in the home):  Immediate Family, Spouse/significant other   Professional Supports: None   Employment: Unemployed   Type of Work:     Education:  9 to 11 years   Homebound arranged:    Surveyor, quantity Resources:  Medicaid   Other Resources:      Cultural/Religious Considerations Which May Impact Care:    Strengths:  Ability to meet basic needs  , Home prepared for child  , Pediatrician chosen   Psychotropic Medications:         Pediatrician:    [employer] area  Pediatrician List:   [facility]  [place]    [place]    [facility]      Pediatrician Fax Number:    Risk Factors/Current Problems:  Mental Health Concerns     Cognitive State:  Able to Concentrate  , Alert  , Goal Oriented  , Insightful  , Linear Thinking     Mood/Affect:  Calm  , Comfortable  , Happy  , Interested  , Relaxed     CSW Assessment: CSW received a consult  for a hx of schizophrenia, depression and anxiety. CSW met [name] at bedside to complete a full psychosocial assessment and offer support. CSW entered the room, introduced herself and acknowledged that her family was present. [name] gave CSW verbal permission to speak about anything while they were present. CSW explained her role and the reason for the visit. [name] was polite, easy to engage, receptive to meeting with CSW, and appeared forthcoming.  CSW collected [name]'s demographic information and inquired about her mental health history. [name] reported being diagnosed with Schizophrenia, Anxiety and Depression around the age of 28 years old. [name] reported the Schizophrenia diagnoses came about due to auditory and visual hallucinations. [name] reported prior to pregnancy she was prescribed 4 different medications for support; however she discontinued her use for the safety of the infant. [name] reported at the moment her mood has been stable and she has declined any/all hallucinations currently. [name] reported if her symptoms return she will collaborate alongside of her psychiatrist (Neuropathic) for medication support. [name] reported currently participating in therapy, every two weeks with [address] ([address] MEd, N[facility], [facility],  [facility]) and will continue these sessions during her PP period for support. CSW provided education regarding the baby blues period vs. perinatal mood disorders, discussed treatment and gave resources for mental health follow up if concerns arise.  CSW recommends self-evaluation during the postpartum time period using the New Mom Checklist from Postpartum Progress and encouraged [name] to contact a medical professional if symptoms are noted at any time.  CSW assessed for safety with [name] SI/HI; [name] denied SI/HI. CSW did not assess for DV; [name] was present.    CSW asked [name] does she receive support resources; [name] said No(WIC and food stamps). [name] reported she had made an appointment for [facility] support and they  will reapply for foodstamps. [name] reported having all essential items for the infant including a carseat, bassinet and crib for safe sleeping. CSW provided review of Sudden Infant Death Syndrome (SIDS) precautions   CSW Plan/Description:  CSW identifies no further need for intervention and no barriers to discharge at this time.    Rosina MARLA Molt, LCSW 08/06/2023, 2:03 PM

## 2023-08-06 NOTE — Progress Notes (Signed)
 POSTPARTUM PROGRESS NOTE  Subjective: Melissa Gilmore is a 28 y.o. G1P1001 s/p LTCS at [redacted]w[redacted]d for arrest of dilation.  She reports she doing well. No acute events overnight. She denies any problems with ambulating, voiding or po intake. Denies nausea or vomiting. She has  passed flatus. Pain is well controlled.  Lochia is appropriate like a light period.  Objective: Blood pressure 115/73, pulse 67, temperature 97.9 F (36.6 C), temperature source Oral, resp. rate 17, height 5' 4 (1.626 m), weight 113.4 kg, last menstrual period 11/12/2022, SpO2 97%, unknown if currently breastfeeding.  Physical Exam:  General: alert, cooperative and no distress Chest: no respiratory distress Abdomen: soft, non-tender. Wound vac in place with good seal.  Uterine Fundus: firm, appropriately tender Extremities: No calf swelling or tenderness  +1 edema  Recent Labs    08/05/23 0848 08/06/23 0430  HGB 12.6 9.2*  HCT 38.6 27.5*    Assessment/Plan: Melissa Gilmore is a 28 y.o. G1P1001 s/p LTCS at [redacted]w[redacted]d for arrest of dilation.  Routine Postpartum Care: Doing well, pain well-controlled.  -- Continue routine care, lactation support  -- Contraception: unsure -- Feeding: Breast and bottle -- Oral iron started for therapy 2/2 anemia aeb mHgB. -- Preexisting T2DM: Had been on Lantus  30units HS and Ozempic  prior to conception. Was changed to 40 units lantus  and SSI in pregnancy. Previous trial of metformin  with stomach upset. After discussion with SDM, patient agrees to trial of metformin  xr 500mg  BID dosing. Will order this change with CBGs to monitor therapy.   Dispo: Plan for discharge 08/07/2023.  Camie Rote, MSN, CNM, RNC-OB Certified Nurse Midwife, [facility] 08/06/2023 11:28 AM

## 2023-08-06 NOTE — Lactation Note (Signed)
 This note was copied from a baby's chart. Lactation Consultation Note  Patient Name: Melissa Gilmore Today's Date: 08/06/2023 Age:27 hours Reason for consult: Follow-up assessment;Primapara;Early term 37-38.6wks;Maternal endocrine disorder Dad had assisted mom in latching baby. Lc assessed and praised parents for good latch and good body alignment. Mom was so happy. After feeding gave baby mom's colostrum she pumped. Mom was ready to go to sleepy after baby fed. LC swaddled for parents and placed in bassinet for mom beside mom's bed.  LC washed mom's pump parts. Mom stated she is going to pump next feeding and dad will give formula while she pumps and they will give that BM at the next feeding. Encouraged mom to put baby to the breast. Mom stated yes she is, mom is going to do both, BF and pump and bottle feed. Mom stated for her mind set she needs to make sure that the baby is getting something. LC stated that is fine, put baby to the breast first then supplement. Encouraged to call for assistance as needed.   Maternal Data Has patient been taught Hand Expression?: Yes Does the patient have breastfeeding experience prior to this delivery?: No  Feeding Mother's Current Feeding Choice: Breast Milk and Formula  LATCH Score Latch: Grasps breast easily, tongue down, lips flanged, rhythmical sucking.  Audible Swallowing: A few with stimulation  Type of Nipple: Flat  Comfort (Breast/Nipple): Soft / non-tender  Hold (Positioning): No assistance needed to correctly position infant at breast.  LATCH Score: 8   Lactation Tools Discussed/Used    Interventions Interventions: Breast feeding basics reviewed;Skin to skin;Breast compression;Expressed milk;Education  Discharge    Consult Status Consult Status: Follow-up Date: 08/06/23 Follow-up type: In-patient    Evette Diclemente G 08/06/2023, 2:07 AM

## 2023-08-06 NOTE — Progress Notes (Signed)
 Negative wound vac machine in hospital continued to alert that there was a blockage alert, tubing noted to be patent and checked dressing area, suction noted and dressing was secure.  Changed to the portable Prevena machine to see if it would alarm while using that machine was in use because the whole dressing would need to be replaced before patient went home if it continued to alarm.  No alarms noted with the Prevena portable machine, will continue to assess.

## 2023-08-07 ENCOUNTER — Other Ambulatory Visit (HOSPITAL_COMMUNITY): Payer: Self-pay

## 2023-08-07 LAB — SURGICAL PATHOLOGY

## 2023-08-07 LAB — GLUCOSE, CAPILLARY: Glucose-Capillary: 92 mg/dL (ref 70–99)

## 2023-08-07 MED ORDER — METFORMIN HCL ER 500 MG PO TB24
500.0000 mg | ORAL_TABLET | Freq: Two times a day (BID) | ORAL | 0 refills | Status: DC
  Filled 2023-08-07: qty 60, 30d supply, fill #0

## 2023-08-07 MED ORDER — FUROSEMIDE 40 MG PO TABS
40.0000 mg | ORAL_TABLET | Freq: Every day | ORAL | 0 refills | Status: DC
  Filled 2023-08-07: qty 6, 6d supply, fill #0

## 2023-08-07 MED ORDER — IBUPROFEN 800 MG PO TABS
800.0000 mg | ORAL_TABLET | Freq: Three times a day (TID) | ORAL | 0 refills | Status: DC
  Filled 2023-08-07: qty 30, 10d supply, fill #0

## 2023-08-07 MED ORDER — ACETAMINOPHEN 500 MG PO TABS
1000.0000 mg | ORAL_TABLET | Freq: Three times a day (TID) | ORAL | 0 refills | Status: DC
  Filled 2023-08-07: qty 30, 5d supply, fill #0

## 2023-08-07 MED ORDER — POTASSIUM CHLORIDE CRYS ER 20 MEQ PO TBCR
20.0000 meq | EXTENDED_RELEASE_TABLET | Freq: Every day | ORAL | 0 refills | Status: DC
  Filled 2023-08-07: qty 6, 6d supply, fill #0

## 2023-08-07 MED ORDER — OXYCODONE HCL 5 MG PO TABS: 5.0000 mg | ORAL_TABLET | Freq: Four times a day (QID) | ORAL | 0 refills | Status: DC | PRN

## 2023-08-07 MED ORDER — OXYCODONE HCL 5 MG PO TABS
5.0000 mg | ORAL_TABLET | Freq: Four times a day (QID) | ORAL | 0 refills | Status: DC | PRN
  Filled 2023-08-07: qty 10, 3d supply, fill #0

## 2023-08-07 MED ORDER — LANTUS SOLOSTAR 100 UNIT/ML ~~LOC~~ SOPN: 30.0000 [IU] | PEN_INJECTOR | Freq: Every day | SUBCUTANEOUS | Status: DC

## 2023-08-07 MED ORDER — FERROUS SULFATE 325 (65 FE) MG PO TABS
325.0000 mg | ORAL_TABLET | ORAL | 3 refills | Status: DC
  Filled 2023-08-07: qty 90, 180d supply, fill #0

## 2023-08-07 MED ORDER — NIFEDIPINE ER 30 MG PO TB24
30.0000 mg | ORAL_TABLET | Freq: Every day | ORAL | 0 refills | Status: DC
  Filled 2023-08-07: qty 30, 30d supply, fill #0

## 2023-08-07 MED ORDER — SENNOSIDES-DOCUSATE SODIUM 8.6-50 MG PO TABS
2.0000 | ORAL_TABLET | Freq: Every day | ORAL | 0 refills | Status: DC
  Filled 2023-08-07: qty 60, 30d supply, fill #0

## 2023-08-07 NOTE — Progress Notes (Signed)
   Outpatient Lactation Booking Note  Visited parent to establish outpatient lactation care. Melissa Gilmore and baby are doing well. States sleepy at the breast, and has been giving formula. Mom shared she really wants to breastfeed, and has support from husband that is encouraging her its okay if baby needs formula. OP LC congratulated on birth of baby, reviewed outpatient lactation support and encouraged to call with any questions or concerns. Briefly reviewed tips to help wake up baby to eat if sleepy, encouraged any amount of breast milk is healthy, and outpatient appointment details.    Parent accepted outpatient lactation care.  Future Appointments  Date Time Provider Department Center  08/14/2023 10:20 AM [facility] NURSE [facility]  09/18/2023 10:35 AM Claudene Virginia , CNM [facility]  05/07/2024  3:30 PM Tobie Arleta SQUIBB, MD [facility] None    Appointment instructions added to discharge instructions.  Melissa Gilmore, [facility] for [facility]

## 2023-08-07 NOTE — Patient Instructions (Addendum)
 Your appointment with Outpatient Lactation is: July 16 th at 2:00 pm [facility] (First Floor) [address] [place]  Check in under baby's name.  Please bring your baby hungry along with your pump and a bottle of either formula or expressed breast milk. Please also bring your pump flanges and we welcome support people! If you need lactation assistance before your appointment, please call [phone] and press 4 for lactation.   --  Lactation support groups:  [facility], Tuesdays 10:00 am -12:00 pm at [address] on the second floor in the conference room, lactating parents and lap babies welcome.  [email or web address]  [email or web address]

## 2023-08-07 NOTE — Inpatient Diabetes Management (Signed)
 Inpatient Diabetes Program Recommendations  AACE/ADA: New Consensus Statement on Inpatient Glycemic Control (2015)  Target Ranges:  Prepandial:   less than 140 mg/dL      Peak postprandial:   less than 180 mg/dL (1-2 hours)      Critically ill patients:  140 - 180 mg/dL   Lab Results  Component Value Date   GLUCAP 92 08/07/2023   HGBA1C 5.7 (H) 05/22/2023    Review of Glycemic Control  Latest Reference Range & Units 08/05/23 12:11 08/05/23 18:20 08/05/23 21:34 08/06/23 12:45 08/06/23 20:34 08/06/23 22:13 08/07/23 10:03  Glucose-Capillary 70 - 99 mg/dL 85 833 (H) 823 (H) 87 870 (H) 152 (H) 92  (H): Data is abnormally high  Diabetes history: DM2 Outpatient Diabetes medications:  Prior to pregnancy: Lantus  15-20 units every day Ozempic  weekly Prior to delivery: Lantus  40 units every day Novolog  06/10/08 Current orders for Inpatient glycemic control:  Semglee  30 every day Novolog  0-20 TID  Metformin  500 mg BID  Met with patient at bedside.  Confirmed DM meds prior to pregnancy as listed above.  Discussed hormonal changes postpartum and decrease in insulin  resistance.  She wears a [employer] 3; currently located on back of her right arm.  She is aware to watch her glucose trends closely.  May need to decrease Lantus  at home to avoid hypoglycemia.  Reviewed hypoglycemia, < 70 mg/dL, signs, symptoms and treatments.   Thank you, Wyvonna Pinal, MSN, CDCES Diabetes Coordinator Inpatient Diabetes Program [phone] (team pager from 8a-5p)

## 2023-08-07 NOTE — Lactation Note (Addendum)
 This note was copied from a baby's chart. Lactation Consultation Note  Patient Name: Girl Galen Malkowski Today's Date: 08/07/2023 Age:27 hours Reason for consult: Follow-up assessment  P1, 38 wks, @ 48 hrs. Mom more rested today. Discussed expectations of milk coming in. Re-assured pumping every 3 hours will help stimulate milk- protecting it coming in on time. Expected that her initial colostrum amounts have decreased since birth, re-assure expected return/ increase around day 3 or 4. Mom shares her experience latching baby, that's reassured her the most that baby was able to latch and work @ her breast. Praised moms efforts. Encouraged mom in working with baby first and supplementing or pumping after putting to breast. Mom has her wearable pump @ bedside. Discussed flange kits for smaller nipple sizes and that flange sizes change in the first days/weeks with breast changes.  Mom could discharge or stay another night, covered encouragement in case of discharge. Encouraged mom to keep working on big mouth latch with baby and use EBM or coconut oil after each feed- provided coconut oil and encouraged use with pumping. Discussed cluster feeding overnight/ early morning brings in our milk supply, shared expectations of milk coming in. Highlighted risk of engorgement. Discussed hand pump/express to soften breasts, motrin as anti-inflammatory, and ice packs for 10-20 minutes post feed/pumping if still over-full is the best treatments for inflamed/engorged breasts.  Maternal Data Has patient been taught Hand Expression?: Yes Does the patient have breastfeeding experience prior to this delivery?: No  Feeding Mother's Current Feeding Choice: Breast Milk and Formula Nipple Type: Slow - flow   Lactation Tools Discussed/Used Tools: Pump  Interventions Interventions: Hand express;Breast compression;Expressed milk;Coconut oil;Hand pump;DEBP;Education;LC Services brochure;CDC milk storage  guidelines  Discharge Discharge Education: Engorgement and breast care Pump: Hands Free;Manual;Personal  Consult Status Consult Status: Complete    Mickenzie Stolar 08/07/2023, 11:00 AM

## 2023-08-07 NOTE — Progress Notes (Signed)
 Enrolled pt to babyscripts hypertension protocol, gave pt BP cuff and instructed on app use and to input BP on app twice daily, pt acknowledged instructions.

## 2023-08-08 ENCOUNTER — Telehealth: Payer: Self-pay | Admitting: Lactation Services

## 2023-08-08 ENCOUNTER — Encounter: Payer: Self-pay | Admitting: Obstetrics & Gynecology

## 2023-08-08 NOTE — Telephone Encounter (Signed)
 Spoke with Dr. Herchel and she reports patient should keep her Nifedipine at 30 mg. Reviewed to continue to monitor BP and report to MAU for symptoms mentioned previously. Patient voiced understanding. She will continue to record her BP in [employer].

## 2023-08-08 NOTE — Telephone Encounter (Signed)
 Called and spoke with patient. 116/70 blood pressure this morning. She reports she is feeling fine. She was informed to increase her BP medication as prescribed. Will route to Dr. Herchel for advisement as BP much lower today. Reviewed with patient when to seek emergent care. Reviewed the information is in My Chart also.

## 2023-08-09 ENCOUNTER — Inpatient Hospital Stay (HOSPITAL_COMMUNITY)
Admission: AD | Admit: 2023-08-09 | Discharge: 2023-08-09 | Disposition: A | Discharge status: Home / Self Care | Payer: MEDICAID | Attending: Family Medicine | Admitting: Family Medicine

## 2023-08-09 DIAGNOSIS — Z4889 Encounter for other specified surgical aftercare: Secondary | ICD-10-CM | POA: Diagnosis not present

## 2023-08-09 DIAGNOSIS — Z48816 Encounter for surgical aftercare following surgery on the genitourinary system: Secondary | ICD-10-CM | POA: Insufficient documentation

## 2023-08-09 DIAGNOSIS — O900 Disruption of cesarean delivery wound: Secondary | ICD-10-CM | POA: Insufficient documentation

## 2023-08-09 NOTE — MAU Provider Note (Signed)
 [facility] Attending MAU Note  Chief Complaint: Wound Check    None     SUBJECTIVE Melissa Gilmore is a 28 y.o. G1P1001 at Unknown by LMP who presents with 4 days s/p PLTCS with prevena wound vac in place. Had leaking due to loss of seal today with the Tegaderm sticking to itself.  Past Medical History:  Diagnosis Date   Acute cholecystitis with chronic cholecystitis 07/16/2021   Acute lumbar back pain 03/18/2022   Anxiety    Childhood asthma    Depression    Major Depression disorder   Diabetes mellitus without complication (HCC)    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HCC)    Dyslipidemia 07/17/2021   Headache    Migraines   History of abnormal uterine bleeding 07/17/2021   Schizophrenia (HCC)    Syncope 03/23/2022   Urticaria    OB History  Gravida Para Term Preterm AB Living  1 1 1  0 0 1  SAB IAB Ectopic Multiple Live Births  0 0 0 0 1    # Outcome Date GA Lbr Len/2nd Weight Sex Type Anes PTL Lv  1 Term 08/05/23 [redacted]w[redacted]d  3470 g F CS-LTranv EPI  LIV   Past Surgical History:  Procedure Laterality Date   CESAREAN SECTION N/A 08/05/2023   Procedure: CESAREAN DELIVERY;  Surgeon: Jayne Vonn DEL, MD;  Location: [facility];  Service: Obstetrics;  Laterality: N/A;   CHOLECYSTECTOMY N/A 07/17/2021   Procedure: SINGLE SITE LAPAROSCOPIC CHOLECYSTECTOMY WITH INTRAOPERATIVE CHOLANGIOGRAM; LIVER BIOPSY;  Surgeon: Sheldon Standing, MD;  Location: [facility];  Service: General;  Laterality: N/A;   IR EPIDUROGRAPHY  03/21/2022   LUMBAR LAMINECTOMY/ DECOMPRESSION WITH MET-RX Right 06/06/2022   Procedure: Right Lumbar three-four Minimally Invasive Surgery Laminectomy/Discectomy;  Surgeon: Cheryle Debby LABOR, MD;  Location: [facility];  Service: Neurosurgery;  Laterality: Right;   WISDOM TOOTH EXTRACTION     Social History   Socioeconomic History   Marital status: Single    Spouse name: Not on file   Number of children: Not on file   Years of education: Not on file   Highest  education level: 11th grade  Occupational History   Not on file  Tobacco Use   Smoking status: Never    Passive exposure: Never   Smokeless tobacco: Never  Vaping Use   Vaping status: Never Used  Substance and Sexual Activity   Alcohol use: Not Currently    Comment: Occasional- events   Drug use: Never   Sexual activity: Yes    Birth control/protection: None  Other Topics Concern   Not on file  Social History Narrative   Not on file   Social Drivers of Health   Financial Resource Strain: Medium Risk (12/12/2022)   Overall Financial Resource Strain (CARDIA)    Difficulty of Paying Living Expenses: Somewhat hard  Food Insecurity: Food Insecurity Present (08/03/2023)   Hunger Vital Sign    Worried About Running Out of Food in the Last Year: Sometimes true    Ran Out of Food in the Last Year: Sometimes true  Transportation Needs: No Transportation Needs (08/03/2023)   PRAPARE - Administrator, Civil Service (Medical): No    Lack of Transportation (Non-Medical): No  Physical Activity: Insufficiently Active (12/12/2022)   Exercise Vital Sign    Days of Exercise per Week: 3 days    Minutes of Exercise per Session: 30 min  Stress: No Stress Concern Present (12/12/2022)   [employer] of Occupational Health -  Occupational Stress Questionnaire    Feeling of Stress : Only a little  Social Connections: Moderately Isolated (12/12/2022)   Social Connection and Isolation Panel    Frequency of Communication with Friends and Family: Three times a week    Frequency of Social Gatherings with Friends and Family: Twice a week    Attends Religious Services: Never    Database administrator or Organizations: No    Attends Engineer, structural: Not on file    Marital Status: Living with partner  Intimate Partner Violence: Not At Risk (08/03/2023)   Humiliation, Afraid, Rape, and Kick questionnaire    Fear of Current or Ex-Partner: No    Emotionally Abused: No     Physically Abused: No    Sexually Abused: No   No current facility-administered medications on file prior to encounter.   Current Outpatient Medications on File Prior to Encounter  Medication Sig Dispense Refill   acetaminophen  (TYLENOL ) 500 MG tablet Take 2 tablets (1,000 mg total) by mouth every 8 (eight) hours. 30 tablet 0   cetirizine  (ZYRTEC  ALLERGY ) 10 MG tablet Take 1 tablet (10 mg total) by mouth daily as needed for allergies. 30 tablet 11   Continuous Glucose Sensor (FREESTYLE LIBRE 3 SENSOR) MISC Place 1 sensor on the skin every 14 days. Use to check glucose continuously 2 each 6   famotidine  (PEPCID ) 20 MG tablet Take 1 tablet (20 mg total) by mouth 2 (two) times daily. 60 tablet 3   ferrous sulfate 325 (65 FE) MG tablet Take 1 tablet (325 mg total) by mouth every other day. 90 tablet 3   furosemide (LASIX) 40 MG tablet Take 1 tablet (40 mg total) by mouth daily. 6 tablet 0   ibuprofen (ADVIL) 800 MG tablet Take 1 tablet (800 mg total) by mouth every 8 (eight) hours. 30 tablet 0   insulin  glargine (LANTUS  SOLOSTAR) 100 UNIT/ML Solostar Pen Inject 30 Units into the skin at bedtime.     Insulin  Pen Needle (B-D UF III MINI PEN NEEDLES) 31G X 5 MM MISC Use as instructed. Inject into the skin once dailyUse as instructed. Inject into the skin once daily (Changed Rx to QID on 06/29/23) 100 each 3   metFORMIN  (GLUCOPHAGE -XR) 500 MG 24 hr tablet Take 1 tablet (500 mg total) by mouth 2 (two) times daily with a meal. 60 tablet 0   NIFEdipine (ADALAT CC) 30 MG 24 hr tablet Take 1 tablet (30 mg total) by mouth daily. 30 tablet 0   oxyCODONE  (ROXICODONE ) 5 MG immediate release tablet Take 1 tablet (5 mg total) by mouth every 6 (six) hours as needed for severe pain (pain score 7-10). 10 tablet 0   potassium chloride  SA (KLOR-CON  M) 20 MEQ tablet Take 1 tablet (20 mEq total) by mouth daily. 6 tablet 0   Prenatal 28-0.8 MG TABS Take 1 tablet by mouth daily. 30 tablet 12   senna-docusate (SENOKOT-S)  8.6-50 MG tablet Take 2 tablets by mouth daily. 60 tablet 0   No Known Allergies  ROS: Pertinent items in HPI  OBJECTIVE BP 127/85   Pulse 92   Temp 98.5 F (36.9 C)   Resp 18   Ht 5' 4 (1.626 m)   Wt 107.4 kg   SpO2 100%   BMI 40.65 kg/m  CONSTITUTIONAL: Well-developed, well-nourished female in no acute distress.  HENT:  Normocephalic, atraumatic, External right and left ear normal. Oropharynx is clear and moist EYES: Conjunctivae and EOM are normal. No scleral  icterus.  NECK: Normal range of motion, supple, no masses.  Normal thyroid .  SKIN: Skin is warm and dry. No rash noted. Not diaphoretic. No erythema. No pallor. NEUROLGIC: Alert and oriented to person, place, and time. CARDIOVASCULAR: Normal heart rate noted RESPIRATORY: Effort normal ABDOMEN: Soft, normal bowel sounds, no distention noted.  No tenderness, rebound or guarding.  SKIN: Wound vac in place with good seal. Inferior edge of the Tegaderm re-inforced with small Tegaderm x 3. MUSCULOSKELETAL: Normal range of motion. No tenderness.  No cyanosis, clubbing, or edema.  2+ distal pulses.  LAB RESULTS No results found for this or any previous visit (from the past 48 hours).  IMAGING US  FETAL BPP WO NON STRESS Result Date: 07/31/2023 ----------------------------------------------------------------------  OBSTETRICS REPORT                       (Signed Final 07/31/2023 03:53 pm) ---------------------------------------------------------------------- Patient Info  ID #:       [id number]                          D.O.B.:  December 01, 1995 (27 yrs)(F)  Name:       Melissa LEOS DELA            Visit Date: 07/31/2023 03:15 pm              Gilmore ---------------------------------------------------------------------- Performed By  Attending:        Vina Solian MD        Ref. Address:     [address]                                                             [place], [place]                                                              [address]  Performed By:     Scarlet Flesher         [facility]  Referred By:      [facility] ---------------------------------------------------------------------- Orders  #  Description                           Code        Ordered By  1  US  FETAL BPP WO NON STRESS            76819.0     MATTHEW ECKSTAT ----------------------------------------------------------------------  #  Order #                     Accession #                Episode #  1  [id number]                   [id number]                 [id number] ---------------------------------------------------------------------- Service(s) Provided  US  Fetal BPP W/O NST                                  [phone] ---------------------------------------------------------------------- Indications  [redacted] weeks gestation of pregnancy                Z3A.37  Pre-existing diabetes, type 2, in pregnancy,   O24.113  third trimester ---------------------------------------------------------------------- Fetal Evaluation  Num Of Fetuses:         1  Fetal Heart Rate(bpm):  165  Cardiac Activity:       Observed  Presentation:           Cephalic  AFI Sum(cm)     %Tile       Largest Pocket(cm)  11.4            35          7.52  RUQ(cm)       RLQ(cm)       LUQ(cm)        LLQ(cm)  2.66          7.52          1.22           0  Comment:    8/8 ---------------------------------------------------------------------- Biophysical Evaluation  Amniotic F.V:   Pocket => 2 cm             F. Tone:        Observed  F. Movement:    Observed                   Score:          8/8  F. Breathing:   Observed ---------------------------------------------------------------------- OB History   Gravidity:    1 ---------------------------------------------------------------------- Gestational Age  LMP:           37w 2d        Date:  11/12/22  EDD:   08/19/23  Best:          37w 2d     Det. By:  LMP  (11/12/22)          EDD:   08/19/23 ---------------------------------------------------------------------- Impression  BPP 8/8 with normal AFI. ---------------------------------------------------------------------- Recommendations  Continue weekly antenatal testing until delivery. ----------------------------------------------------------------------                 Vina Solian, MD Electronically Signed Final Report   07/31/2023 03:53 pm ----------------------------------------------------------------------   US  MFM FETAL BPP WO NON STRESS Result Date: 07/24/2023 ----------------------------------------------------------------------  OBSTETRICS REPORT                       (Signed Final 07/24/2023 02:46 pm) ---------------------------------------------------------------------- Patient Info  ID #:       [id number]                          D.O.B.:  Jan 19, 1996 (27 yrs)(F)  Name:       Melissa LEOS DELA            Visit Date: 07/24/2023 01:21 pm              Gilmore ---------------------------------------------------------------------- Performed By  Attending:        Delora Smaller DO       Ref. Address:     [address]                                                             [place], [place]                                                             [address]  Performed By:     Melissa Gilmore BS      [facility]  Referred By:      [facility] ---------------------------------------------------------------------- Orders  #  Description                            Code        Ordered By  1  US  MFM FETAL BPP WO NON               76819.01    Melissa Gilmore     STRESS  2  US  MFM OB FOLLOW UP                   76816.01    [facility] ----------------------------------------------------------------------  #  Order #                     Accession #                Episode #  1  [id number]                   [id number]                 [id number]  2  [id number]                   [id number]                 [id number] ---------------------------------------------------------------------- Indications  Pre-existing diabetes, type 2, in pregnancy,   O24.113  third trimester  Obesity complicating pregnancy, third          O99.213  trimester  Elevated MSAFP                                 O28.9  Antenatal screening for elevated               Z36.1  alphafetoprotein level (AFP)  Family history of congenital anomaly (Spina    Z82.79  Bifida)  Encounter for other antenatal screening        Z36.2  follow-up  [redacted] weeks gestation of pregnancy                Z3A.36  LR NIPS / Neg Horizon ---------------------------------------------------------------------- Fetal Evaluation  Num Of Fetuses:         1  Fetal Heart Rate(bpm):  157  Cardiac Activity:       Observed  Presentation:           Cephalic  Placenta:               Anterior  P. Cord Insertion:      Previously seen  Amniotic Fluid  AFI FV:      Within normal limits  AFI Sum(cm)     %Tile       Largest Pocket(cm)  14.26           52          5.51  RUQ(cm)       RLQ(cm)       LUQ(cm)        LLQ(cm)  5.31          5.51          1.69           1.75 ---------------------------------------------------------------------- Biophysical Evaluation  Amniotic F.V:   Within normal limits       F. Tone:        Observed  F. Movement:  Observed                   Score:          8/8  F. Breathing:   Observed ---------------------------------------------------------------------- Biometry  BPD:      91.3  mm     G. Age:  37w 0d         79  %     CI:        77.42   %    70 - 86                                                          FL/HC:      21.1   %    20.1 - 22.1  HC:      328.5  mm     G. Age:  37w 2d         46  %    HC/AC:      0.93        0.93 - 1.11  AC:      352.6  mm     G. Age:  39w 1d       > 99  %    FL/BPD:     76.0   %    71 - 87  FL:       69.4  mm     G. Age:  35w 4d         29  %    FL/AC:      19.7   %    20 - 24  Est. FW:    3344  gm      7 lb 6 oz     90  % ---------------------------------------------------------------------- OB History  Gravidity:    1 ---------------------------------------------------------------------- Gestational Age  LMP:           36w 2d        Date:  11/12/22                  EDD:   08/19/23  U/S Today:     37w 2d                                        EDD:   08/12/23  Best:          36w 2d     Det. By:  LMP  (11/12/22)          EDD:   08/19/23 ---------------------------------------------------------------------- Anatomy  Cranium:               Appears normal         Aortic Arch:            Previously seen  Cavum:                 Previously seen        Ductal Arch:            Previously seen  Ventricles:            Previously seen        Diaphragm:  Appears normal  Choroid Plexus:        Previously seen        Stomach:                Appears normal, left                                                                        sided  Cerebellum:            Previously seen        Abdomen:                Previously seen  Posterior Fossa:       Previously seen        Abdominal Wall:         Previously seen  Face:                  Orbits and profile     Cord Vessels:           Previously seen                         previously seen  Lips:                  Previously seen        Kidneys:                Appear normal  Thoracic:              Previously seen        Bladder:                Appears normal  Heart:                 Appears normal         Spine:                  Previously seen                          (4CH, axis, and                         situs)  RVOT:                  Previously seen        Upper Extremities:      Previously seen  LVOT:                  Previously seen        Lower Extremities:      Previously seen ---------------------------------------------------------------------- Cervix Uterus Adnexa  Cervix  Not visualized (advanced GA >24wks)  Uterus  No abnormality visualized.  Right Ovary  Not visualized.  Left Ovary  Not visualized.  Cul De Sac  No free fluid seen.  Adnexa  No abnormality visualized ---------------------------------------------------------------------- Comments  Maternal Fetal medicine Consult  Melissa Gilmore is a 28 y.o. G1P0000 at  [redacted]w[redacted]d here for ultrasound and consultation. Melissa  RAMIREZ [name] is doing well today with no acute  concerns. Today we focused on the  following:  Type 2 diabetes: The patient reports her blood sugars are  well-controlled.  I discussed the ultrasound findings showing  the estimated fetal weight in the 90th percentile.  She will  continue to assess her blood sugars and fetal movement.  The patient had time to ask questions that were answered to  her satisfaction.  She verbalized understanding and agrees to  proceed with the plan below.  Sonographic findings  Single intrauterine pregnancy.  Fetal cardiac activity: Observed.  Presentation: Cephalic.  Interval fetal anatomy appears normal.  Fetal biometry shows the estimated fetal weight at the 90  percentile.  Amniotic fluid: Within normal limits.  MVP: 5.51 cm.  Placenta: Anterior.  BPP: 8/8.  There are limitations of prenatal ultrasound such as the  inability to detect certain abnormalities due to poor  visualization. Various factors such as fetal position,  gestational age and maternal body habitus may increase the  difficulty in visualizing the fetal anatomy.  Recommendations  -Weekly antenatal testing until delivery  - Delivery is planned for 6/26  ----------------------------------------------------------------------                  Delora Smaller, DO Electronically Signed Final Report   07/24/2023 02:46 pm ----------------------------------------------------------------------   US  MFM OB FOLLOW UP Result Date: 07/24/2023 ----------------------------------------------------------------------  OBSTETRICS REPORT                       (Signed Final 07/24/2023 02:46 pm) ---------------------------------------------------------------------- Patient Info  ID #:       [id number]                          D.O.B.:  1995/06/14 (27 yrs)(F)  Name:       Melissa LEOS DELA            Visit Date: 07/24/2023 01:21 pm              Gilmore ---------------------------------------------------------------------- Performed By  Attending:        Delora Smaller DO       Ref. Address:     [address]                                                             [place], [place]                                                             [address]  Performed By:     Melissa Gilmore BS      [facility]  Referred By:      [facility] ---------------------------------------------------------------------- Orders  #  Description                           Code        Ordered By  1  US  MFM FETAL BPP WO NON               76819.01    Melissa Gilmore     STRESS  2  US  MFM OB FOLLOW UP                   [id number]    DELORA SMALLER ----------------------------------------------------------------------  #  Order #                     Accession #                Episode #  1  [id number]                   [id number]                 [id number]  2  [id number]                   [id number]                 [id number]  ---------------------------------------------------------------------- Indications  Pre-existing diabetes, type 2, in pregnancy,   O24.113  third trimester  Obesity complicating pregnancy, third          O99.213  trimester  Elevated MSAFP                                 O28.9  Antenatal screening for elevated               Z36.1  alphafetoprotein level (AFP)  Family history of congenital anomaly (Spina    Z82.79  Bifida)  Encounter for other antenatal screening        Z36.2  follow-up  [redacted] weeks gestation of pregnancy                Z3A.36  LR NIPS / Neg Horizon ---------------------------------------------------------------------- Fetal Evaluation  Num Of Fetuses:         1  Fetal Heart Rate(bpm):  157  Cardiac Activity:       Observed  Presentation:           Cephalic  Placenta:               Anterior  P. Cord Insertion:      Previously seen  Amniotic Fluid  AFI FV:      Within normal limits  AFI Sum(cm)     %Tile       Largest Pocket(cm)  14.26           52  5.51  RUQ(cm)       RLQ(cm)       LUQ(cm)        LLQ(cm)  5.31          5.51          1.69           1.75 ---------------------------------------------------------------------- Biophysical Evaluation  Amniotic F.V:   Within normal limits       F. Tone:        Observed  F. Movement:    Observed                   Score:          8/8  F. Breathing:   Observed ---------------------------------------------------------------------- Biometry  BPD:      91.3  mm     G. Age:  37w 0d         79  %    CI:        77.42   %    70 - 86                                                          FL/HC:      21.1   %    20.1 - 22.1  HC:      328.5  mm     G. Age:  37w 2d         46  %    HC/AC:      0.93        0.93 - 1.11  AC:      352.6  mm     G. Age:  39w 1d       > 99  %    FL/BPD:     76.0   %    71 - 87  FL:       69.4  mm     G. Age:  35w 4d         29  %    FL/AC:      19.7   %    20 - 24  Est. FW:    3344  gm      7 lb 6 oz     90  %  ---------------------------------------------------------------------- OB History  Gravidity:    1 ---------------------------------------------------------------------- Gestational Age  LMP:           36w 2d        Date:  11/12/22                  EDD:   08/19/23  U/S Today:     37w 2d                                        EDD:   08/12/23  Best:          36w 2d     Det. By:  LMP  (11/12/22)          EDD:   08/19/23 ---------------------------------------------------------------------- Anatomy  Cranium:               Appears normal         Aortic  Arch:            Previously seen  Cavum:                 Previously seen        Ductal Arch:            Previously seen  Ventricles:            Previously seen        Diaphragm:              Appears normal  Choroid Plexus:        Previously seen        Stomach:                Appears normal, left                                                                        sided  Cerebellum:            Previously seen        Abdomen:                Previously seen  Posterior Fossa:       Previously seen        Abdominal Wall:         Previously seen  Face:                  Orbits and profile     Cord Vessels:           Previously seen                         previously seen  Lips:                  Previously seen        Kidneys:                Appear normal  Thoracic:              Previously seen        Bladder:                Appears normal  Heart:                 Appears normal         Spine:                  Previously seen                         (4CH, axis, and                         situs)  RVOT:                  Previously seen        Upper Extremities:      Previously seen  LVOT:                  Previously seen        Lower Extremities:  Previously seen ---------------------------------------------------------------------- Cervix Uterus Adnexa  Cervix  Not visualized (advanced GA >24wks)  Uterus  No abnormality visualized.  Right Ovary  Not visualized.  Left Ovary  Not  visualized.  Cul De Sac  No free fluid seen.  Adnexa  No abnormality visualized ---------------------------------------------------------------------- Comments  Maternal Fetal medicine Consult  Melissa Gilmore is a 28 y.o. G1P0000 at  [redacted]w[redacted]d here for ultrasound and consultation. Emila  RAMIREZ [name] is doing well today with no acute  concerns. Today we focused on the following:  Type 2 diabetes: The patient reports her blood sugars are  well-controlled.  I discussed the ultrasound findings showing  the estimated fetal weight in the 90th percentile.  She will  continue to assess her blood sugars and fetal movement.  The patient had time to ask questions that were answered to  her satisfaction.  She verbalized understanding and agrees to  proceed with the plan below.  Sonographic findings  Single intrauterine pregnancy.  Fetal cardiac activity: Observed.  Presentation: Cephalic.  Interval fetal anatomy appears normal.  Fetal biometry shows the estimated fetal weight at the 90  percentile.  Amniotic fluid: Within normal limits.  MVP: 5.51 cm.  Placenta: Anterior.  BPP: 8/8.  There are limitations of prenatal ultrasound such as the  inability to detect certain abnormalities due to poor  visualization. Various factors such as fetal position,  gestational age and maternal body habitus may increase the  difficulty in visualizing the fetal anatomy.  Recommendations  -Weekly antenatal testing until delivery  - Delivery is planned for 6/26 ----------------------------------------------------------------------                  Delora Smaller, DO Electronically Signed Final Report   07/24/2023 02:46 pm ----------------------------------------------------------------------    MAU COURSE Wound eval was reassuring Wound vac in place and working appropriately Re-in forced edges  ASSESSMENT 1. Encounter for postoperative wound care     PLAN Discharge home  Follow-up Information     [facility].   Specialty: Obstetrics and Gynecology Why: keep next scheduled appointment Contact information: [address] [place]  [address] [phone]               Allergies as of 08/09/2023   No Known Allergies      Medication List     TAKE these medications    Acetaminophen  Extra Strength 500 MG Tabs Take 2 tablets (1,000 mg total) by mouth every 8 (eight) hours.   B-D UF III MINI PEN NEEDLES 31G X 5 MM Misc Generic drug: Insulin  Pen Needle Use as instructed. Inject into the skin once dailyUse as instructed. Inject into the skin once daily (Changed Rx to QID on 06/29/23)   cetirizine  10 MG tablet Commonly known as: ZyrTEC  Allergy  Take 1 tablet (10 mg total) by mouth daily as needed for allergies.   famotidine  20 MG tablet Commonly known as: PEPCID  Take 1 tablet (20 mg total) by mouth 2 (two) times daily.   FeroSul 325 (65 Fe) MG tablet Generic drug: ferrous sulfate Take 1 tablet (325 mg total) by mouth every other day.   FreeStyle Libre 3 Sensor Misc Place 1 sensor on the skin every 14 days. Use to check glucose continuously   furosemide 40 MG tablet Commonly known as: LASIX Take 1 tablet (40 mg total) by mouth daily.   ibuprofen 800 MG tablet Commonly known as: ADVIL Take 1 tablet (800  mg total) by mouth every 8 (eight) hours.   Lantus  SoloStar 100 UNIT/ML Solostar Pen Generic drug: insulin  glargine Inject 30 Units into the skin at bedtime.   metFORMIN  500 MG 24 hr tablet Commonly known as: GLUCOPHAGE -XR Take 1 tablet (500 mg total) by mouth 2 (two) times daily with a meal.   NIFEdipine 30 MG 24 hr tablet Commonly known as: ADALAT CC Take 1 tablet (30 mg total) by mouth daily.   oxyCODONE  5 MG immediate release tablet Commonly known as: Roxicodone  Take 1 tablet (5 mg total) by mouth every 6 (six) hours as needed for severe pain (pain score 7-10).   potassium chloride  SA 20 MEQ  tablet Commonly known as: KLOR-CON  M Take 1 tablet (20 mEq total) by mouth daily.   Prenatal 28-0.8 MG Tabs Take 1 tablet by mouth daily.   Senna-S 8.6-50 MG tablet Generic drug: senna-docusate Take 2 tablets by mouth daily.        Evaluation does not show pathology that would require ongoing emergent intervention or inpatient treatment. Patient is hemodynamically stable and mentating appropriately. Discussed findings and plan with patient, who agrees with care plan. All questions answered. Return precautions discussed and outpatient follow up recommendations given.  Fredirick Glenys RAMAN, MD 08/09/2023 10:20 PM

## 2023-08-09 NOTE — Progress Notes (Signed)
 Written and verbal d/c instructions given and understanding voiced.

## 2023-08-09 NOTE — MAU Note (Addendum)
 Melissa Gilmore is a 28 y.o. at Unknown here in MAU reporting having a c/s Sunday. She has wound vac. Some of the dsg came loose so the wound vac is not working correctly. Dr Fredirick came into Triage and reinforced dsg with more Tegaderm.   LMP: n/a Onset of complaint: tonight Pain score: 5 Vitals:   08/09/23 2208 08/09/23 2210  BP:  127/85  Pulse: 92   Resp: 18   Temp: 98.5 F (36.9 C)   SpO2: 100%      FHT: na  Lab orders placed from triage: none

## 2023-08-11 ENCOUNTER — Other Ambulatory Visit: Payer: Self-pay | Admitting: Obstetrics and Gynecology

## 2023-08-11 ENCOUNTER — Inpatient Hospital Stay (HOSPITAL_COMMUNITY)
Admission: AD | Admit: 2023-08-11 | Discharge: 2023-08-11 | Disposition: A | Discharge status: Home / Self Care | Payer: MEDICAID | Attending: Obstetrics & Gynecology | Admitting: Obstetrics & Gynecology

## 2023-08-11 DIAGNOSIS — Z5971 Insufficient health insurance coverage: Secondary | ICD-10-CM | POA: Insufficient documentation

## 2023-08-11 DIAGNOSIS — Z48816 Encounter for surgical aftercare following surgery on the genitourinary system: Secondary | ICD-10-CM | POA: Insufficient documentation

## 2023-08-11 DIAGNOSIS — O900 Disruption of cesarean delivery wound: Secondary | ICD-10-CM | POA: Insufficient documentation

## 2023-08-11 DIAGNOSIS — O9089 Other complications of the puerperium, not elsewhere classified: Secondary | ICD-10-CM | POA: Diagnosis not present

## 2023-08-11 DIAGNOSIS — Z4689 Encounter for fitting and adjustment of other specified devices: Secondary | ICD-10-CM

## 2023-08-11 DIAGNOSIS — R1030 Lower abdominal pain, unspecified: Secondary | ICD-10-CM | POA: Diagnosis present

## 2023-08-11 MED ORDER — OXYCODONE HCL 5 MG PO TABS: 5.0000 mg | ORAL_TABLET | Freq: Four times a day (QID) | ORAL | 0 refills | Status: DC | PRN

## 2023-08-11 NOTE — MAU Provider Note (Addendum)
 Chief Complaint: wound vac   None     SUBJECTIVE HPI: Melissa Gilmore is a 28 y.o. G1P1001 who is 5 days postpartum following PLTCS with prevena wound vac in place.  The wound vac started beeping early this morning and woke her up. Her husband noted that the tube leading in to the dressing was torn, so there was no suction.  Pt reports some mild lower abdominal pain and she has not picked up her oxycodone  since [facility] will not take her insurance anymore.  Bleeding is light.  There is no n/v, or fever/chills.      HPI  Past Medical History:  Diagnosis Date   Acute cholecystitis with chronic cholecystitis 07/16/2021   Acute lumbar back pain 03/18/2022   Anxiety    Childhood asthma    Depression    Major Depression disorder   Diabetes mellitus without complication [facility])    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HCC)    Dyslipidemia 07/17/2021   Headache    Migraines   History of abnormal uterine bleeding 07/17/2021   Schizophrenia (HCC)    Syncope 03/23/2022   Urticaria    Past Surgical History:  Procedure Laterality Date   CESAREAN SECTION N/A 08/05/2023   Procedure: CESAREAN DELIVERY;  Surgeon: Jayne Vonn DEL, MD;  Location: [facility] ORS;  Service: Obstetrics;  Laterality: N/A;   CHOLECYSTECTOMY N/A 07/17/2021   Procedure: SINGLE SITE LAPAROSCOPIC CHOLECYSTECTOMY WITH INTRAOPERATIVE CHOLANGIOGRAM; LIVER BIOPSY;  Surgeon: Sheldon Standing, MD;  Location: [facility] ORS;  Service: General;  Laterality: N/A;   IR EPIDUROGRAPHY  03/21/2022   LUMBAR LAMINECTOMY/ DECOMPRESSION WITH MET-RX Right 06/06/2022   Procedure: Right Lumbar three-four Minimally Invasive Surgery Laminectomy/Discectomy;  Surgeon: Cheryle Debby LABOR, MD;  Location: [facility] OR;  Service: Neurosurgery;  Laterality: Right;   WISDOM TOOTH EXTRACTION     Social History   Socioeconomic History   Marital status: Single    Spouse name: Not on file   Number of children: Not on file   Years of education: Not on file   Highest  education level: 11th grade  Occupational History   Not on file  Tobacco Use   Smoking status: Never    Passive exposure: Never   Smokeless tobacco: Never  Vaping Use   Vaping status: Never Used  Substance and Sexual Activity   Alcohol use: Not Currently    Comment: Occasional- events   Drug use: Never   Sexual activity: Yes    Birth control/protection: None  Other Topics Concern   Not on file  Social History Narrative   Not on file   Social Drivers of Health   Financial Resource Strain: Medium Risk (12/12/2022)   Overall Financial Resource Strain (CARDIA)    Difficulty of Paying Living Expenses: Somewhat hard  Food Insecurity: Food Insecurity Present (08/03/2023)   Hunger Vital Sign    Worried About Running Out of Food in the Last Year: Sometimes true    Ran Out of Food in the Last Year: Sometimes true  Transportation Needs: No Transportation Needs (08/03/2023)   PRAPARE - Administrator, Civil Service (Medical): No    Lack of Transportation (Non-Medical): No  Physical Activity: Insufficiently Active (12/12/2022)   Exercise Vital Sign    Days of Exercise per Week: 3 days    Minutes of Exercise per Session: 30 min  Stress: No Stress Concern Present (12/12/2022)   [employer] of Occupational Health - Occupational Stress Questionnaire    Feeling of Stress : Only  a little  Social Connections: Moderately Isolated (12/12/2022)   Social Connection and Isolation Panel    Frequency of Communication with Friends and Family: Three times a week    Frequency of Social Gatherings with Friends and Family: Twice a week    Attends Religious Services: Never    Database administrator or Organizations: No    Attends Engineer, structural: Not on file    Marital Status: Living with partner  Intimate Partner Violence: Not At Risk (08/03/2023)   Humiliation, Afraid, Rape, and Kick questionnaire    Fear of Current or Ex-Partner: No    Emotionally Abused: No     Physically Abused: No    Sexually Abused: No   No current facility-administered medications on file prior to encounter.   Current Outpatient Medications on File Prior to Encounter  Medication Sig Dispense Refill   acetaminophen  (TYLENOL ) 500 MG tablet Take 2 tablets (1,000 mg total) by mouth every 8 (eight) hours. 30 tablet 0   ferrous sulfate  325 (65 FE) MG tablet Take 1 tablet (325 mg total) by mouth every other day. 90 tablet 3   furosemide  (LASIX ) 40 MG tablet Take 1 tablet (40 mg total) by mouth daily. 6 tablet 0   ibuprofen  (ADVIL ) 800 MG tablet Take 1 tablet (800 mg total) by mouth every 8 (eight) hours. 30 tablet 0   metFORMIN  (GLUCOPHAGE -XR) 500 MG 24 hr tablet Take 1 tablet (500 mg total) by mouth 2 (two) times daily with a meal. 60 tablet 0   NIFEdipine  (ADALAT  CC) 30 MG 24 hr tablet Take 1 tablet (30 mg total) by mouth daily. 30 tablet 0   potassium chloride  SA (KLOR-CON  M) 20 MEQ tablet Take 1 tablet (20 mEq total) by mouth daily. 6 tablet 0   Prenatal 28-0.8 MG TABS Take 1 tablet by mouth daily. 30 tablet 12   senna-docusate (SENOKOT-S) 8.6-50 MG tablet Take 2 tablets by mouth daily. 60 tablet 0   cetirizine  (ZYRTEC  ALLERGY ) 10 MG tablet Take 1 tablet (10 mg total) by mouth daily as needed for allergies. 30 tablet 11   Continuous Glucose Sensor (FREESTYLE LIBRE 3 SENSOR) MISC Place 1 sensor on the skin every 14 days. Use to check glucose continuously 2 each 6   famotidine  (PEPCID ) 20 MG tablet Take 1 tablet (20 mg total) by mouth 2 (two) times daily. 60 tablet 3   insulin  glargine (LANTUS  SOLOSTAR) 100 UNIT/ML Solostar Pen Inject 30 Units into the skin at bedtime.     Insulin  Pen Needle (B-D UF III MINI PEN NEEDLES) 31G X 5 MM MISC Use as instructed. Inject into the skin once dailyUse as instructed. Inject into the skin once daily (Changed Rx to QID on 06/29/23) 100 each 3   No Known Allergies  ROS:  Review of Systems  Constitutional:  Negative for chills, fatigue and fever.   Eyes:  Negative for visual disturbance.  Respiratory:  Negative for shortness of breath.   Cardiovascular:  Negative for chest pain.  Gastrointestinal:  Positive for abdominal pain. Negative for nausea and vomiting.  Genitourinary:  Negative for difficulty urinating, dysuria, flank pain, pelvic pain, vaginal bleeding, vaginal discharge and vaginal pain.  Skin:        Incision with wound vac, mild incisional pain  Neurological:  Negative for dizziness and headaches.  Psychiatric/Behavioral: Negative.       I have reviewed patient's Past Medical Hx, Surgical Hx, Family Hx, Social Hx, medications and allergies.   Physical Exam  Patient Vitals for the past 24 hrs:  BP Temp Temp src Pulse Resp SpO2 Height Weight  08/11/23 1117 124/84 98.8 F (37.1 C) Oral 82 15 100 % 5' 4 (1.626 m) 106.4 kg   Constitutional: Well-developed, well-nourished female in no acute distress.  Cardiovascular: normal rate Respiratory: normal effort GI: Abd soft, non-tender. Pos BS x 4 MS: Extremities nontender, no edema, normal ROM Neurologic: Alert and oriented x 4.  GU: Neg CVAT. Skin: incision well approximated without erythema, edema, or exudate  PELVIC EXAM: Deferred  LAB RESULTS No results found for this or any previous visit (from the past 24 hours).  --/--/O POS (06/26 2126)  IMAGING   MAU Management/MDM: Orders Placed This Encounter  Procedures   Discharge patient Discharge disposition: 01-Home or Self Care; Discharge patient date: 08/11/2023    Meds ordered this encounter  Medications   oxyCODONE  (ROXICODONE ) 5 MG immediate release tablet    Sig: Take 1 tablet (5 mg total) by mouth every 6 (six) hours as needed for severe pain (pain score 7-10).    Dispense:  10 tablet    Refill:  0    Supervising Provider:   EVELINE LYNWOOD MATSU [phone]    Prevena wound vac with tubing at attachment point to dressing torn almost all the way off.  Dressing removed and wound is well appearing without  problems.  Reviewed wound cleaning with patient who was discharged to home with return precautions.    ASSESSMENT 1. Encounter for management of wound VAC   2. Postpartum complication     PLAN Discharge home Allergies as of 08/11/2023   No Known Allergies      Medication List     TAKE these medications    Acetaminophen  Extra Strength 500 MG Tabs Take 2 tablets (1,000 mg total) by mouth every 8 (eight) hours.   B-D UF III MINI PEN NEEDLES 31G X 5 MM Misc Generic drug: Insulin  Pen Needle Use as instructed. Inject into the skin once dailyUse as instructed. Inject into the skin once daily (Changed Rx to QID on 06/29/23)   cetirizine  10 MG tablet Commonly known as: ZyrTEC  Allergy  Take 1 tablet (10 mg total) by mouth daily as needed for allergies.   famotidine  20 MG tablet Commonly known as: PEPCID  Take 1 tablet (20 mg total) by mouth 2 (two) times daily.   FeroSul 325 (65 Fe) MG tablet Generic drug: ferrous sulfate  Take 1 tablet (325 mg total) by mouth every other day.   FreeStyle Libre 3 Sensor Misc Place 1 sensor on the skin every 14 days. Use to check glucose continuously   furosemide  40 MG tablet Commonly known as: LASIX  Take 1 tablet (40 mg total) by mouth daily.   ibuprofen  800 MG tablet Commonly known as: ADVIL  Take 1 tablet (800 mg total) by mouth every 8 (eight) hours.   Lantus  SoloStar 100 UNIT/ML Solostar Pen Generic drug: insulin  glargine Inject 30 Units into the skin at bedtime.   metFORMIN  500 MG 24 hr tablet Commonly known as: GLUCOPHAGE -XR Take 1 tablet (500 mg total) by mouth 2 (two) times daily with a meal.   NIFEdipine  30 MG 24 hr tablet Commonly known as: ADALAT  CC Take 1 tablet (30 mg total) by mouth daily.   oxyCODONE  5 MG immediate release tablet Commonly known as: Roxicodone  Take 1 tablet (5 mg total) by mouth every 6 (six) hours as needed for severe pain (pain score 7-10).   potassium chloride  SA 20 MEQ tablet Commonly known as:  KLOR-CON  M Take 1 tablet (20 mEq total) by mouth daily.   Prenatal 28-0.8 MG Tabs Take 1 tablet by mouth daily.   Senna-S 8.6-50 MG tablet Generic drug: senna-docusate Take 2 tablets by mouth daily.        Follow-up Information     [facility].   Specialty: Obstetrics and Gynecology Why: As scheduled Contact information: [address] [place]  [address] [phone]        [facility] Follow up.   Specialty: Obstetrics and Gynecology Why: As needed for emergencies Contact information: [address] [place]  [address] [phone]                Olam Boards Certified Nurse-Midwife 08/11/2023  12:32 PM

## 2023-08-11 NOTE — MAU Note (Signed)
 Melissa Gilmore is a 28 y.o. at Unknown here in MAU reporting: c/s on 08/05/23 and has wound vac in place. Part of the tubing has lifted from the bandage. Reporting soreness at incision site. Denies any excessive bleeding. Denies any draingae from wound vac.     Pain score: 4 Vitals:   08/11/23 1117  BP: 124/84  Pulse: 82  Resp: 15  Temp: 98.8 F (37.1 C)  SpO2: 100%      Lab orders placed from triage:

## 2023-08-13 ENCOUNTER — Other Ambulatory Visit: Payer: Self-pay

## 2023-08-13 ENCOUNTER — Ambulatory Visit: Payer: MEDICAID | Admitting: *Deleted

## 2023-08-13 VITALS — BP 123/82 | HR 107 | Ht 64.0 in | Wt 229.9 lb

## 2023-08-13 DIAGNOSIS — Z4889 Encounter for other specified surgical aftercare: Secondary | ICD-10-CM

## 2023-08-13 NOTE — Progress Notes (Signed)
 Patient in office for an incision check.  Incision is CDI without redness swelling or discharge.  Reviewed wound care.  Blood pressure WNL.  Advised to continue Procardia  as ordered.  Reviewed postpartum appointment date being 09/18/2023.  Discussed signs of preeclampsia with patient and she voiced understanding.  Rosina Sink RN

## 2023-08-14 ENCOUNTER — Ambulatory Visit: Payer: MEDICAID

## 2023-08-21 ENCOUNTER — Encounter: Payer: Self-pay | Admitting: Obstetrics and Gynecology

## 2023-08-31 ENCOUNTER — Emergency Department (HOSPITAL_COMMUNITY)
Admission: EM | Admit: 2023-08-31 | Discharge: 2023-08-31 | Disposition: A | Discharge status: Home / Self Care | Payer: MEDICAID | Attending: Emergency Medicine | Admitting: Emergency Medicine

## 2023-08-31 ENCOUNTER — Other Ambulatory Visit: Payer: Self-pay

## 2023-08-31 DIAGNOSIS — M5431 Sciatica, right side: Secondary | ICD-10-CM | POA: Insufficient documentation

## 2023-08-31 DIAGNOSIS — J45909 Unspecified asthma, uncomplicated: Secondary | ICD-10-CM | POA: Diagnosis not present

## 2023-08-31 DIAGNOSIS — E119 Type 2 diabetes mellitus without complications: Secondary | ICD-10-CM | POA: Insufficient documentation

## 2023-08-31 DIAGNOSIS — Z7984 Long term (current) use of oral hypoglycemic drugs: Secondary | ICD-10-CM | POA: Diagnosis not present

## 2023-08-31 DIAGNOSIS — Z794 Long term (current) use of insulin: Secondary | ICD-10-CM | POA: Insufficient documentation

## 2023-08-31 LAB — CBC WITH DIFFERENTIAL/PLATELET
Abs Immature Granulocytes: 0.02 K/uL (ref 0.00–0.07)
Basophils Absolute: 0 K/uL (ref 0.0–0.1)
Basophils Relative: 1 %
Eosinophils Absolute: 0.4 K/uL (ref 0.0–0.5)
Eosinophils Relative: 5 %
HCT: 29.9 % — ABNORMAL LOW (ref 36.0–46.0)
Hemoglobin: 9.7 g/dL — ABNORMAL LOW (ref 12.0–15.0)
Immature Granulocytes: 0 %
Lymphocytes Relative: 38 %
Lymphs Abs: 2.7 K/uL (ref 0.7–4.0)
MCH: 26.2 pg (ref 26.0–34.0)
MCHC: 32.4 g/dL (ref 30.0–36.0)
MCV: 80.8 fL (ref 80.0–100.0)
Monocytes Absolute: 0.5 K/uL (ref 0.1–1.0)
Monocytes Relative: 7 %
Neutro Abs: 3.5 K/uL (ref 1.7–7.7)
Neutrophils Relative %: 49 %
Platelets: 352 K/uL (ref 150–400)
RBC: 3.7 MIL/uL — ABNORMAL LOW (ref 3.87–5.11)
RDW: 13.8 % (ref 11.5–15.5)
WBC: 7.1 K/uL (ref 4.0–10.5)
nRBC: 0 % (ref 0.0–0.2)

## 2023-08-31 LAB — BASIC METABOLIC PANEL WITH GFR
Anion gap: 9 (ref 5–15)
BUN: 6 mg/dL (ref 6–20)
CO2: 24 mmol/L (ref 22–32)
Calcium: 8.8 mg/dL — ABNORMAL LOW (ref 8.9–10.3)
Chloride: 103 mmol/L (ref 98–111)
Creatinine, Ser: 0.85 mg/dL (ref 0.44–1.00)
GFR, Estimated: >60 mL/min (ref >60)
Glucose, Bld: 217 mg/dL — ABNORMAL HIGH (ref 70–99)
Potassium: 3.5 mmol/L (ref 3.5–5.1)
Sodium: 136 mmol/L (ref 135–145)

## 2023-08-31 LAB — I-STAT CHEM 8, ED
BUN: 5 mg/dL — ABNORMAL LOW (ref 6–20)
Calcium, Ion: 1.17 mmol/L (ref 1.15–1.40)
Chloride: 103 mmol/L (ref 98–111)
Creatinine, Ser: 0.8 mg/dL (ref 0.44–1.00)
Glucose, Bld: 219 mg/dL — ABNORMAL HIGH (ref 70–99)
HCT: 29 % — ABNORMAL LOW (ref 36.0–46.0)
Hemoglobin: 9.9 g/dL — ABNORMAL LOW (ref 12.0–15.0)
Potassium: 3.5 mmol/L (ref 3.5–5.1)
Sodium: 139 mmol/L (ref 135–145)
TCO2: 24 mmol/L (ref 22–32)

## 2023-08-31 MED ORDER — METHOCARBAMOL 500 MG PO TABS
750.0000 mg | ORAL_TABLET | Freq: Once | ORAL | Status: AC
  Administered 2023-08-31: 750 mg via ORAL
  Filled 2023-08-31: qty 2

## 2023-08-31 MED ORDER — METHOCARBAMOL 500 MG PO TABS: 500.0000 mg | ORAL_TABLET | Freq: Three times a day (TID) | ORAL | 0 refills | Status: DC | PRN

## 2023-08-31 MED ORDER — KETOROLAC TROMETHAMINE 15 MG/ML IJ SOLN
15.0000 mg | Freq: Once | INTRAMUSCULAR | Status: AC
  Administered 2023-08-31: 15 mg via INTRAVENOUS
  Filled 2023-08-31: qty 1

## 2023-08-31 NOTE — ED Triage Notes (Addendum)
 Patient lost her balance and almost fell this evening and injured her lower back with pain , pain increases with movement/changing positions . Pain radiating to right leg .

## 2023-08-31 NOTE — ED Provider Notes (Signed)
 [place] EMERGENCY DEPARTMENT AT [facility] Provider Note  CSN: [id number] Arrival date & time: 08/31/23 0010  Chief Complaint(s) Back Pain  HPI Melissa Gilmore is a 28 y.o. female with a past medical history listed below who presents to the emergency department for right-sided back pain radiating to the right leg.  Patient reports a prior history of disc herniation.  Reports that this evening, she was walking up the steps, lost her balance and almost fell.  She was able to catch herself but in doing so, she felt her back spasm causing significant pain.  Patient denies any trauma.  Pain is worse with movement, range of motion.  No lower extremity weakness.  The history is provided by the patient.    Past Medical History Past Medical History:  Diagnosis Date   Acute cholecystitis with chronic cholecystitis 07/16/2021   Acute lumbar back pain 03/18/2022   Anxiety    Childhood asthma    Depression    Major Depression disorder   Diabetes mellitus without complication (HCC)    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HCC)    Dyslipidemia 07/17/2021   Headache    Migraines   History of abnormal uterine bleeding 07/17/2021   Schizophrenia (HCC)    Syncope 03/23/2022   Urticaria    Patient Active Problem List   Diagnosis Date Noted   Status post primary low transverse cesarean section 08/05/2023   Gestational hypertension 08/03/2023   Uterine contractions during pregnancy 08/02/2023   Elevated blood pressure affecting pregnancy in third trimester, antepartum 08/02/2023   Nausea and vomiting during pregnancy 08/02/2023   Diarrhea during pregnancy 08/02/2023   Type 2 diabetes mellitus during pregnancy 08/02/2023   GBS (group B Streptococcus carrier), +RV culture, currently pregnant 07/23/2023   BMI 40.0-44.9, adult (HCC) 04/09/2023   Abnormal antenatal AFP screen 03/25/2023   Pre-existing type 2 diabetes mellitus in pregnancy 03/25/2023   Family history of spina  bifida 02/27/2023   Supervision of high risk pregnancy, antepartum 02/22/2023   Lumbar radiculopathy 06/06/2022   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/2021   Schizophrenia (HCC) 07/17/2021   Depression 07/17/2021   Insulin -requiring or dependent type II diabetes mellitus (HCC) 07/17/2021   Home Medication(s) Prior to Admission medications   Medication Sig Start Date End Date Taking? Authorizing Provider  methocarbamol  (ROBAXIN ) 500 MG tablet Take 1-2 tablets (500-1,000 mg total) by mouth every 8 (eight) hours as needed for muscle spasms. 08/31/23  Yes Galvin Aversa, Raynell Moder, MD  acetaminophen  (TYLENOL ) 500 MG tablet Take 2 tablets (1,000 mg total) by mouth every 8 (eight) hours. 08/07/23   Kumar, Agnijita, MD  cetirizine  (ZYRTEC  ALLERGY ) 10 MG tablet Take 1 tablet (10 mg total) by mouth daily as needed for allergies. 05/08/23   Tobie Arleta SQUIBB, MD  Continuous Glucose Sensor (FREESTYLE LIBRE 3 SENSOR) MISC Place 1 sensor on the skin every 14 days. Use to check glucose continuously 05/15/23   Izell Harari, MD  famotidine  (PEPCID ) 20 MG tablet Take 1 tablet (20 mg total) by mouth 2 (two) times daily. Patient not taking: Reported on 08/13/2023 05/15/23   Izell Harari, MD  ferrous sulfate  325 (65 FE) MG tablet Take 1 tablet (325 mg total) by mouth every other day. 08/08/23   Kumar, Agnijita, MD  ibuprofen  (ADVIL ) 800 MG tablet Take 1 tablet (800 mg total) by mouth every 8 (eight) hours. 08/07/23   Kumar, Agnijita, MD  insulin  glargine (LANTUS  SOLOSTAR) 100 UNIT/ML Solostar Pen Inject 30 Units into the skin at bedtime.  08/07/23   Kumar, Agnijita, MD  Insulin  Pen Needle (B-D UF III MINI PEN NEEDLES) 31G X 5 MM MISC Use as instructed. Inject into the skin once dailyUse as instructed. Inject into the skin once daily (Changed Rx to QID on 06/29/23) 06/29/23   Leftwich-Kirby, Olam LABOR, CNM  metFORMIN  (GLUCOPHAGE -XR) 500 MG 24 hr tablet Take 1 tablet (500 mg total) by mouth 2 (two) times daily with a meal. 08/07/23    Von Reasoner, MD  NIFEdipine  (ADALAT  CC) 30 MG 24 hr tablet Take 1 tablet (30 mg total) by mouth daily. 08/08/23   Von Reasoner, MD  oxyCODONE  (ROXICODONE ) 5 MG immediate release tablet Take 1 tablet (5 mg total) by mouth every 6 (six) hours as needed for severe pain (pain score 7-10). 08/11/23 08/10/24  Leftwich-Kirby, Olam LABOR, CNM  Prenatal 28-0.8 MG TABS Take 1 tablet by mouth daily. 02/05/23   Anyanwu, Ugonna A, MD  senna-docusate (SENOKOT-S) 8.6-50 MG tablet Take 2 tablets by mouth daily. 08/08/23   Kumar, Agnijita, MD                                                                                                                                    Allergies Patient has no known allergies.  Review of Systems Review of Systems As noted in HPI  Physical Exam Vital Signs  I have reviewed the triage vital signs BP 130/80 (BP Location: Right Arm)   Pulse 86   Temp 98.1 F (36.7 C) (Oral)   Resp 20   SpO2 100%   Physical Exam Vitals reviewed.  Constitutional:      General: She is not in acute distress.    Appearance: She is well-developed. She is obese. She is not diaphoretic.  HENT:     Head: Normocephalic and atraumatic.     Right Ear: External ear normal.     Left Ear: External ear normal.     Nose: Nose normal.  Eyes:     General: No scleral icterus.    Conjunctiva/sclera: Conjunctivae normal.  Neck:     Trachea: Phonation normal.  Cardiovascular:     Rate and Rhythm: Normal rate and regular rhythm.  Pulmonary:     Effort: Pulmonary effort is normal. No respiratory distress.     Breath sounds: No stridor.  Abdominal:     General: There is no distension.  Musculoskeletal:        General: Normal range of motion.     Cervical back: Normal range of motion.     Lumbar back: Spasms and tenderness present.       Back:  Neurological:     Mental Status: She is alert and oriented to person, place, and time.  Psychiatric:        Behavior: Behavior normal.     ED Results  and Treatments Labs (all labs ordered are listed, but only abnormal results are displayed) Labs Reviewed  CBC  WITH DIFFERENTIAL/PLATELET - Abnormal; Notable for the following components:      Result Value   RBC 3.70 (*)    Hemoglobin 9.7 (*)    HCT 29.9 (*)    All other components within normal limits  BASIC METABOLIC PANEL WITH GFR - Abnormal; Notable for the following components:   Glucose, Bld 217 (*)    Calcium  8.8 (*)    All other components within normal limits  I-STAT CHEM 8, ED - Abnormal; Notable for the following components:   BUN 5 (*)    Glucose, Bld 219 (*)    Hemoglobin 9.9 (*)    HCT 29.0 (*)    All other components within normal limits                                                                                                                         EKG  EKG Interpretation Date/Time:    Ventricular Rate:    PR Interval:    QRS Duration:    QT Interval:    QTC Calculation:   R Axis:      Text Interpretation:         Radiology No results found.  Medications Ordered in ED Medications  methocarbamol  (ROBAXIN ) tablet 750 mg (750 mg Oral Given 08/31/23 0145)  ketorolac  (TORADOL ) 15 MG/ML injection 15 mg (15 mg Intravenous Given 08/31/23 9662)   Procedures Procedures  (including critical care time) Medical Decision Making / ED Course   Medical Decision Making Amount and/or Complexity of Data Reviewed Labs: ordered.  Risk Prescription drug management.    Back pain consistent with muscle strain/spasm of the lumbar sacral region. Patient does have a history of diabetes.  Labs reassuring without leukocytosis.  Anemia with stable hemoglobin.  No significant electrolyte derangements.  Hyperglycemia without DKA.  Provided with Toradol  and Robaxin  which did provide significant relief. Doubt any serious injuries from the incident requiring imaging at this time.  Doubt cauda equina or other serious etiologies    Final Clinical Impression(s) / ED  Diagnoses Final diagnoses:  Sciatica of right side   The patient appears reasonably screened and/or stabilized for discharge and I doubt any other medical condition or other [facility] requiring further screening, evaluation, or treatment in the ED at this time. I have discussed the findings, Dx and Tx plan with the patient/family who expressed understanding and agree(s) with the plan. Discharge instructions discussed at length. The patient/family was given strict return precautions who verbalized understanding of the instructions. No further questions at time of discharge.  Disposition: Discharge  Condition: Good  ED Discharge Orders          Ordered    methocarbamol  (ROBAXIN ) 500 MG tablet  Every 8 hours PRN        08/31/23 0342              Follow Up: Cheryle Debby LABOR, MD       This chart was dictated using voice recognition software.  Despite best efforts to proofread,  errors can occur which can change the documentation meaning.    Trine Raynell Moder, MD 09/07/23 1540

## 2023-08-31 NOTE — Discharge Instructions (Signed)
 For pain control you may take 1000 mg of acetaminophen (Tylenol) every 8 hours and/or 600 mg of Ibuprofen (Motrin, Advil, etc.) every 6-8 hours as needed.  Please limit acetaminophen (Tylenol) to 4000 mg and Ibuprofen (Motrin, Advil, etc.) to 2400 mg for a 24hr period. Please note that other over-the-counter medicine may contain acetaminophen or ibuprofen as a component of their ingredients.

## 2023-09-18 ENCOUNTER — Other Ambulatory Visit: Payer: Self-pay

## 2023-09-18 ENCOUNTER — Ambulatory Visit: Payer: MEDICAID | Admitting: Advanced Practice Midwife

## 2023-09-18 ENCOUNTER — Encounter: Payer: Self-pay | Admitting: Advanced Practice Midwife

## 2023-09-18 DIAGNOSIS — Z3202 Encounter for pregnancy test, result negative: Secondary | ICD-10-CM

## 2023-09-18 DIAGNOSIS — Z30013 Encounter for initial prescription of injectable contraceptive: Secondary | ICD-10-CM

## 2023-09-18 DIAGNOSIS — R6339 Other feeding difficulties: Secondary | ICD-10-CM

## 2023-09-18 LAB — POCT PREGNANCY, URINE: Preg Test, Ur: NEGATIVE

## 2023-09-18 MED ORDER — MEDROXYPROGESTERONE ACETATE 150 MG/ML IM SUSY
150.0000 mg | PREFILLED_SYRINGE | Freq: Once | INTRAMUSCULAR | Status: AC
  Administered 2023-09-18 (×2): 150 mg via INTRAMUSCULAR

## 2023-09-18 NOTE — Progress Notes (Unsigned)
 Post Partum Visit Note  Melissa Gilmore is a 28 y.o. G51P1001 female who presents for a postpartum visit. She is 6 weeks 2 days postpartum following a primary cesarean section on 08/05/23.  I have fully reviewed the prenatal and intrapartum course. The delivery was at 38 gestational weeks.  Anesthesia: epidural. Postpartum course has been good. Baby is doing well. Baby is feeding by both breast and bottle - Similac Advance. Bleeding no bleeding. Bowel function is normal. Bladder function is normal. Patient is sexually active. Contraception method is coitus interruptus. Postpartum depression screening: negative.   The pregnancy intention screening data noted above was reviewed. Potential methods of contraception were discussed. The patient elected to proceed with No data recorded.   Edinburgh Postnatal Depression Scale - 09/18/23 1049       Edinburgh Postnatal Depression Scale:  In the Past 7 Days   I have been able to laugh and see the funny side of things. 0    I have looked forward with enjoyment to things. 0    I have blamed myself unnecessarily when things went wrong. 2    I have been anxious or worried for no good reason. 0    I have felt scared or panicky for no good reason. 2    Things have been getting on top of me. 0    I have been so unhappy that I have had difficulty sleeping. 0    I have felt sad or miserable. 2    I have been so unhappy that I have been crying. 1    The thought of harming myself has occurred to me. 0    Edinburgh Postnatal Depression Scale Total 7          Health Maintenance Due  Topic Date Due   FOOT EXAM  Never done   HPV VACCINES (1 - 3-dose series) Never done   Hepatitis B Vaccines (1 of 3 - 19+ 3-dose series) Never done   OPHTHALMOLOGY EXAM  06/21/2023   INFLUENZA VACCINE  09/07/2023   Diabetic kidney evaluation - Urine ACR  09/29/2023    The following portions of the patient's history were reviewed and updated as appropriate:  allergies, current medications, past family history, past medical history, past social history, past surgical history, and problem list.  Review of Systems Pertinent items are noted in HPI.  Objective:  BP 113/87   Pulse 78   Wt 229 lb 1.6 oz (103.9 kg)   LMP 09/17/2023 (Exact Date)   Breastfeeding Yes Comment: pumping  BMI 39.32 kg/m   LMP 09/16/23  General:  alert, cooperative, and appears stated age   Breasts:  not indicated  Lungs: Regular rate and effort  Heart:  Regular rate  Abdomen: soft, non-tender; bowel sounds normal; no masses,  no organomegaly and healing well   Wound well approximated incision  GU exam:  not indicated       Assessment:   1. Postpartum exam (Primary)  2. Difficulty latching on to breast for feeding - Followed by [facility].  - Referred to Peds dentist   3. Initiation of Depo Provera  - Understands that we cannot being reasonably certain that she is not pregnancy but just started period 09/16/23. Pt wants Depo today. Recommend home UPT in 2 weeks.    Plan:   Essential components of care per ACOG recommendations:  1.  Mood and well being: Patient with {gen negative/positive:315881} depression screening today. Reviewed local resources for support.  -  Patient tobacco use? {tobacco use:25506}  - hx of drug use? {yes/no:25505}    2. Infant care and feeding:  -Patient currently breastmilk feeding? {yes/no:25502}  -Social determinants of health (SDOH) reviewed in EPIC. No concerns***The following needs were identified***  3. Sexuality, contraception and birth spacing - Patient {DOES_DOES WNU:81435} want a pregnancy in the next year.  Desired family size is {NUMBER 1-10:22536} children.  - Reviewed reproductive life planning. Reviewed contraceptive methods based on pt preferences and effectiveness.  Patient desired {Upstream End Methods:24109} today.   - Discussed birth spacing of 18 months  4. Sleep and fatigue -Encouraged family/partner/community  support of 4 hrs of uninterrupted sleep to help with mood and fatigue  5. Physical Recovery  - Discussed patients delivery and complications. She describes her labor as {description:25511} - Patient had a {CHL AMB DELIVERY:[id number]}. Patient had a {laceration:25518} laceration. Perineal healing reviewed. Patient expressed understanding - Patient has urinary incontinence? {yes/no:25515} - Patient {ACTION; IS/IS WNU:78978602} safe to resume physical and sexual activity  6.  Health Maintenance - HM due items addressed {Yes or If no, why not?:20788} - Last pap smear  Diagnosis  Date Value Ref Range Status  02/05/2023   Final   - Negative for intraepithelial lesion or malignancy (NILM)   Pap smear {done:10129} at today's visit.  -Breast Cancer screening indicated? {indicated:25516}  7. Chronic Disease/Pregnancy Condition follow up: {Follow up:25499}  - PCP follow up  Bern Fare  Claudene, CNM [facility] [employer]

## 2023-09-19 ENCOUNTER — Encounter (HOSPITAL_COMMUNITY): Payer: Self-pay | Admitting: Obstetrics & Gynecology

## 2023-09-19 ENCOUNTER — Inpatient Hospital Stay (HOSPITAL_COMMUNITY)
Admission: AD | Admit: 2023-09-19 | Discharge: 2023-09-19 | Disposition: A | Discharge status: Home / Self Care | Payer: MEDICAID | Attending: Obstetrics & Gynecology | Admitting: Obstetrics & Gynecology

## 2023-09-19 DIAGNOSIS — I951 Orthostatic hypotension: Secondary | ICD-10-CM | POA: Diagnosis not present

## 2023-09-19 DIAGNOSIS — R42 Dizziness and giddiness: Secondary | ICD-10-CM | POA: Diagnosis not present

## 2023-09-19 DIAGNOSIS — N921 Excessive and frequent menstruation with irregular cycle: Secondary | ICD-10-CM

## 2023-09-19 LAB — CBC
HCT: 34.5 % — ABNORMAL LOW (ref 36.0–46.0)
Hemoglobin: 11.1 g/dL — ABNORMAL LOW (ref 12.0–15.0)
MCH: 25.9 pg — ABNORMAL LOW (ref 26.0–34.0)
MCHC: 32.2 g/dL (ref 30.0–36.0)
MCV: 80.6 fL (ref 80.0–100.0)
Platelets: 347 K/uL (ref 150–400)
RBC: 4.28 MIL/uL (ref 3.87–5.11)
RDW: 14.3 % (ref 11.5–15.5)
WBC: 7.6 K/uL (ref 4.0–10.5)
nRBC: 0 % (ref 0.0–0.2)

## 2023-09-19 LAB — POCT PREGNANCY, URINE: Preg Test, Ur: NEGATIVE

## 2023-09-19 MED ORDER — LACTATED RINGERS IV BOLUS
1000.0000 mL | Freq: Once | INTRAVENOUS | Status: AC
  Administered 2023-09-19 (×2): 1000 mL via INTRAVENOUS

## 2023-09-19 MED ORDER — MECLIZINE HCL 25 MG PO TABS
25.0000 mg | ORAL_TABLET | Freq: Once | ORAL | Status: AC
  Administered 2023-09-19 (×2): 25 mg via ORAL
  Filled 2023-09-19: qty 1

## 2023-09-19 MED ORDER — SCOPOLAMINE 1 MG/3DAYS TD PT72
1.0000 | MEDICATED_PATCH | Freq: Once | TRANSDERMAL | Status: DC
  Administered 2023-09-19 (×2): 1.5 mg via TRANSDERMAL
  Filled 2023-09-19: qty 1

## 2023-09-19 MED ORDER — KETOROLAC TROMETHAMINE 30 MG/ML IJ SOLN
30.0000 mg | Freq: Once | INTRAMUSCULAR | Status: AC
  Administered 2023-09-19 (×2): 30 mg via INTRAVENOUS
  Filled 2023-09-19: qty 1

## 2023-09-19 NOTE — MAU Provider Note (Signed)
 Chief Complaint: Dizziness, Back Pain, and Vaginal Bleeding   Event Date/Time   First Provider Initiated Contact with Patient 09/19/23 1038      SUBJECTIVE HPI: Melissa Gilmore is a 28 y.o. G1P1001 [redacted]w[redacted]d postpartum from pLTCS who presents to maternity admissions reporting heavy bleeding and dizziness.  Patient had bleeding ~2 weeks PP. She started bleeding again on Saturday; suspects her period. Soaking through a pad every 1-2 hours last night. Had a near syncopal episode with dizziness this AM and sought care. She had intercourse since birth. Depo shot yesterday at [facility] visit. Denies fever/chills, palpitations, cramping. Mild back pain.    HPI  Past Medical History:  Diagnosis Date   Acute cholecystitis with chronic cholecystitis 07/16/2021   Acute lumbar back pain 03/18/2022   Anxiety    Childhood asthma    Depression    Major Depression disorder   Diabetes mellitus without complication [facility])    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HCC)    Dyslipidemia 07/17/2021   Headache    Migraines   History of abnormal uterine bleeding 07/17/2021   Schizophrenia (HCC)    Syncope 03/23/2022   Urticaria    Past Surgical History:  Procedure Laterality Date   CESAREAN SECTION N/A 08/05/2023   Procedure: CESAREAN DELIVERY;  Surgeon: Jayne Vonn DEL, MD;  Location: [facility] LD ORS;  Service: Obstetrics;  Laterality: N/A;   CHOLECYSTECTOMY N/A 07/17/2021   Procedure: SINGLE SITE LAPAROSCOPIC CHOLECYSTECTOMY WITH INTRAOPERATIVE CHOLANGIOGRAM; LIVER BIOPSY;  Surgeon: Sheldon Standing, MD;  Location: [facility] ORS;  Service: General;  Laterality: N/A;   IR EPIDUROGRAPHY  03/21/2022   LUMBAR LAMINECTOMY/ DECOMPRESSION WITH MET-RX Right 06/06/2022   Procedure: Right Lumbar three-four Minimally Invasive Surgery Laminectomy/Discectomy;  Surgeon: Cheryle Debby LABOR, MD;  Location: [facility] OR;  Service: Neurosurgery;  Laterality: Right;   WISDOM TOOTH EXTRACTION     Social History   Socioeconomic History   Marital  status: Single    Spouse name: Not on file   Number of children: Not on file   Years of education: Not on file   Highest education level: 11th grade  Occupational History   Not on file  Tobacco Use   Smoking status: Never    Passive exposure: Never   Smokeless tobacco: Never  Vaping Use   Vaping status: Never Used  Substance and Sexual Activity   Alcohol use: Not Currently    Comment: Occasional- events   Drug use: Never   Sexual activity: Yes    Birth control/protection: Injection  Other Topics Concern   Not on file  Social History Narrative   Not on file   Social Drivers of Health   Financial Resource Strain: Medium Risk (12/12/2022)   Overall Financial Resource Strain (CARDIA)    Difficulty of Paying Living Expenses: Somewhat hard  Food Insecurity: Food Insecurity Present (08/03/2023)   Hunger Vital Sign    Worried About Running Out of Food in the Last Year: Sometimes true    Ran Out of Food in the Last Year: Sometimes true  Transportation Needs: No Transportation Needs (08/03/2023)   PRAPARE - Administrator, Civil Service (Medical): No    Lack of Transportation (Non-Medical): No  Physical Activity: Insufficiently Active (12/12/2022)   Exercise Vital Sign    Days of Exercise per Week: 3 days    Minutes of Exercise per Session: 30 min  Stress: No Stress Concern Present (12/12/2022)   [employer] of Occupational Health - Occupational Stress Questionnaire    Feeling of  Stress : Only a little  Social Connections: Moderately Isolated (12/12/2022)   Social Connection and Isolation Panel    Frequency of Communication with Friends and Family: Three times a week    Frequency of Social Gatherings with Friends and Family: Twice a week    Attends Religious Services: Never    Database administrator or Organizations: No    Attends Engineer, structural: Not on file    Marital Status: Living with partner  Intimate Partner Violence: Not At Risk (08/03/2023)    Humiliation, Afraid, Rape, and Kick questionnaire    Fear of Current or Ex-Partner: No    Emotionally Abused: No    Physically Abused: No    Sexually Abused: No   No current facility-administered medications on file prior to encounter.   Current Outpatient Medications on File Prior to Encounter  Medication Sig Dispense Refill   acetaminophen  (TYLENOL ) 500 MG tablet Take 2 tablets (1,000 mg total) by mouth every 8 (eight) hours. 30 tablet 0   ferrous sulfate  325 (65 FE) MG tablet Take 1 tablet (325 mg total) by mouth every other day. 90 tablet 3   ibuprofen  (ADVIL ) 800 MG tablet Take 1 tablet (800 mg total) by mouth every 8 (eight) hours. 30 tablet 0   insulin  glargine (LANTUS  SOLOSTAR) 100 UNIT/ML Solostar Pen Inject 30 Units into the skin at bedtime.     metFORMIN  (GLUCOPHAGE -XR) 500 MG 24 hr tablet Take 1 tablet (500 mg total) by mouth 2 (two) times daily with a meal. 60 tablet 0   Prenatal 28-0.8 MG TABS Take 1 tablet by mouth daily. 30 tablet 12   cetirizine  (ZYRTEC  ALLERGY ) 10 MG tablet Take 1 tablet (10 mg total) by mouth daily as needed for allergies. 30 tablet 11   Continuous Glucose Sensor (FREESTYLE LIBRE 3 SENSOR) MISC Place 1 sensor on the skin every 14 days. Use to check glucose continuously 2 each 6   famotidine  (PEPCID ) 20 MG tablet Take 1 tablet (20 mg total) by mouth 2 (two) times daily. (Patient not taking: Reported on 08/13/2023) 60 tablet 3   Insulin  Pen Needle (B-D UF III MINI PEN NEEDLES) 31G X 5 MM MISC Use as instructed. Inject into the skin once dailyUse as instructed. Inject into the skin once daily (Changed Rx to QID on 06/29/23) 100 each 3   methocarbamol  (ROBAXIN ) 500 MG tablet Take 1-2 tablets (500-1,000 mg total) by mouth every 8 (eight) hours as needed for muscle spasms. (Patient not taking: Reported on 09/18/2023) 20 tablet 0   NIFEdipine  (ADALAT  CC) 30 MG 24 hr tablet Take 1 tablet (30 mg total) by mouth daily. (Patient not taking: Reported on 09/18/2023) 30  tablet 0   oxyCODONE  (ROXICODONE ) 5 MG immediate release tablet Take 1 tablet (5 mg total) by mouth every 6 (six) hours as needed for severe pain (pain score 7-10). (Patient not taking: Reported on 09/18/2023) 10 tablet 0   senna-docusate (SENOKOT-S) 8.6-50 MG tablet Take 2 tablets by mouth daily. (Patient not taking: Reported on 09/18/2023) 60 tablet 0   No Known Allergies  ROS:  Pertinent positives/negatives listed above.  I have reviewed patient's Past Medical Hx, Surgical Hx, Family Hx, Social Hx, medications and allergies.   Physical Exam  Patient Vitals for the past 24 hrs:  BP Temp Temp src Pulse Resp SpO2  09/19/23 1147 125/73 -- -- 60 -- --  09/19/23 1044 -- -- -- -- -- 100 %  09/19/23 1034 123/82 98.7 F (37.1 C)  Oral 67 18 100 %   Constitutional: Well-developed, well-nourished female in no acute distress.  Cardiovascular: normal rate Respiratory: normal effort GI: Abd soft, non-tender MS: Extremities nontender, no edema, normal ROM Neurologic: Alert and oriented x 4.  GU: Neg CVAT  LAB RESULTS Results for orders placed or performed during the hospital encounter of 09/19/23 (from the past 24 hours)  CBC     Status: Abnormal   Collection Time: 09/19/23 10:56 AM  Result Value Ref Range   WBC 7.6 4.0 - 10.5 K/uL   RBC 4.28 3.87 - 5.11 MIL/uL   Hemoglobin 11.1 (L) 12.0 - 15.0 g/dL   HCT 65.4 (L) 63.9 - 53.9 %   MCV 80.6 80.0 - 100.0 fL   MCH 25.9 (L) 26.0 - 34.0 pg   MCHC 32.2 30.0 - 36.0 g/dL   RDW 85.6 88.4 - 84.4 %   Platelets 347 150 - 400 K/uL   nRBC 0.0 0.0 - 0.2 %  Pregnancy, urine POC     Status: None   Collection Time: 09/19/23  1:58 PM  Result Value Ref Range   Preg Test, Ur NEGATIVE NEGATIVE    --/--/O POS (06/26 2126)  IMAGING No results found.  MAU Management/MDM: Orders Placed This Encounter  Procedures   CBC   Orthostatic vital signs   Pregnancy, urine POC   Discharge patient    Meds ordered this encounter  Medications   lactated  ringers  bolus 1,000 mL   ketorolac  (TORADOL ) 30 MG/ML injection 30 mg   meclizine  (ANTIVERT ) tablet 25 mg   scopolamine  (TRANSDERM-SCOP) 1 MG/3DAYS 1.5 mg    Patient presents with heavy bleeding and near syncopal event [redacted]w[redacted]d PP. Suspect heavy first period after delivery. Will assess for possible pregnancy and anemia. Also obtain orthostatic vital signs. Declines any medications for her back pain.  Orthostatics positive. Will give LR bolus. Patient started to have a headache as well. Toradol  given. CBC with mild anemia, Hgb 11.3. On reassessment, complaining of more vertiginous-type dizziness. Meclizine  and scopolamine  patch given with improvement in symptoms. Discharge home with instructions to try Epley maneuver as well.  ASSESSMENT 1. Menorrhagia with irregular cycle   2. Orthostasis   3. Vertigo   4. Postpartum care following cesarean delivery     PLAN Discharge home with strict return precautions. Allergies as of 09/19/2023   No Known Allergies      Medication List     STOP taking these medications    NIFEdipine  30 MG 24 hr tablet Commonly known as: ADALAT  CC   oxyCODONE  5 MG immediate release tablet Commonly known as: Roxicodone        TAKE these medications    Acetaminophen  Extra Strength 500 MG Tabs Take 2 tablets (1,000 mg total) by mouth every 8 (eight) hours.   B-D UF III MINI PEN NEEDLES 31G X 5 MM Misc Generic drug: Insulin  Pen Needle Use as instructed. Inject into the skin once dailyUse as instructed. Inject into the skin once daily (Changed Rx to QID on 06/29/23)   cetirizine  10 MG tablet Commonly known as: ZyrTEC  Allergy  Take 1 tablet (10 mg total) by mouth daily as needed for allergies.   famotidine  20 MG tablet Commonly known as: PEPCID  Take 1 tablet (20 mg total) by mouth 2 (two) times daily.   FeroSul 325 (65 Fe) MG tablet Generic drug: ferrous sulfate  Take 1 tablet (325 mg total) by mouth every other day.   FreeStyle Libre 3 Sensor Misc Place  1 sensor on the  skin every 14 days. Use to check glucose continuously   ibuprofen  800 MG tablet Commonly known as: ADVIL  Take 1 tablet (800 mg total) by mouth every 8 (eight) hours.   Lantus  SoloStar 100 UNIT/ML Solostar Pen Generic drug: insulin  glargine Inject 30 Units into the skin at bedtime.   metFORMIN  500 MG 24 hr tablet Commonly known as: GLUCOPHAGE -XR Take 1 tablet (500 mg total) by mouth 2 (two) times daily with a meal.   methocarbamol  500 MG tablet Commonly known as: ROBAXIN  Take 1-2 tablets (500-1,000 mg total) by mouth every 8 (eight) hours as needed for muscle spasms.   Prenatal 28-0.8 MG Tabs Take 1 tablet by mouth daily.   Senna-S 8.6-50 MG tablet Generic drug: senna-docusate Take 2 tablets by mouth daily.         Almarie Moats, MD OB Fellow 09/19/2023  2:17 PM

## 2023-09-19 NOTE — MAU Note (Signed)
 Melissa Gilmore is a 28 y.o. at [redacted]w[redacted]d post partum.  here in MAU reporting: c/s 6/29.had bleeding for 2 wks after del.  Sunday she started bleeding like normal.  Today is heavier, stood up to say good by to fiance and got really dizzy.  Soaking pads, changing every 30 min. Feeling dizzy off and on , seeing black spots.  Constant ache in lower back Started having watery stools yesterday- 6/24hrs.  LMP: 8/10- started Onset of complaint: dizziness started today Pain score: severe Vitals:   09/19/23 1034  BP: 123/82  Pulse: 67  Resp: 18  Temp: 98.7 F (37.1 C)  SpO2: 100%      Lab orders placed from triage:  UPT, cbc per provider  Neg UPT at [facility] visit yesterday, received depo injection yesterday

## 2023-09-19 NOTE — MAU Note (Signed)
 Pt continues to complain of dizziness when she sits up. Provider aware

## 2023-10-01 ENCOUNTER — Telehealth: Payer: MEDICAID | Admitting: Family Medicine

## 2023-10-01 DIAGNOSIS — B9689 Other specified bacterial agents as the cause of diseases classified elsewhere: Secondary | ICD-10-CM

## 2023-10-01 DIAGNOSIS — J40 Bronchitis, not specified as acute or chronic: Secondary | ICD-10-CM

## 2023-10-01 DIAGNOSIS — J019 Acute sinusitis, unspecified: Secondary | ICD-10-CM | POA: Diagnosis not present

## 2023-10-01 MED ORDER — PROMETHAZINE-DM 6.25-15 MG/5ML PO SYRP: 5.0000 mL | ORAL_SOLUTION | Freq: Four times a day (QID) | ORAL | 0 refills | Status: AC | PRN

## 2023-10-01 MED ORDER — AMOXICILLIN-POT CLAVULANATE 875-125 MG PO TABS: 1.0000 | ORAL_TABLET | Freq: Two times a day (BID) | ORAL | 0 refills | Status: DC

## 2023-10-01 NOTE — Progress Notes (Signed)
 Virtual Visit Consent   Tyleah Loh [facility], you are scheduled for a virtual visit with a [facility] provider today. Just as with appointments in the office, your consent must be obtained to participate. Your consent will be active for this visit and any virtual visit you may have with one of our providers in the next 365 days. If you have a MyChart account, a copy of this consent can be sent to you electronically.  As this is a virtual visit, video technology does not allow for your provider to perform a traditional examination. This may limit your provider's ability to fully assess your condition. If your provider identifies any concerns that need to be evaluated in person or the need to arrange testing (such as labs, EKG, etc.), we will make arrangements to do so. Although advances in technology are sophisticated, we cannot ensure that it will always work on either your end or our end. If the connection with a video visit is poor, the visit may have to be switched to a telephone visit. With either a video or telephone visit, we are not always able to ensure that we have a secure connection.  By engaging in this virtual visit, you consent to the provision of healthcare and authorize for your insurance to be billed (if applicable) for the services provided during this visit. Depending on your insurance coverage, you may receive a charge related to this service.  I need to obtain your verbal consent now. Are you willing to proceed with your visit today? Melissa Gilmore [facility] has provided verbal consent on 10/01/2023 for a virtual visit (video or telephone). [name], FNP  Date: 10/01/2023 3:17 PM   Virtual Visit via Video Note   I, [name], connected with  Melissa Gilmore  ([id number], [birth year]) on 10/01/23 at  3:15 PM EDT by a video-enabled telemedicine application and verified that I am speaking with the correct person using two identifiers.  Location: Patient:  Virtual Visit Location Patient: Home Provider: Virtual Visit Location Provider: Home Office   I discussed the limitations of evaluation and management by telemedicine and the availability of in person appointments. The patient expressed understanding and agreed to proceed.    History of Present Illness: Melissa Gilmore is a 28 y.o. who identifies as a female who was assigned female at birth, and is being seen today for having been sick since thurs, congested, sore throat, hot flashes. Cough body aches. No covid test done. Both ears hurt. No drainage from ears. Sinus pressure and pain almost a week. Worsening.   HPI: HPI  Problems:  Patient Active Problem List   Diagnosis Date Noted   Status post primary low transverse cesarean section 08/05/2023   Gestational hypertension 08/03/2023   Uterine contractions during pregnancy 08/02/2023   Elevated blood pressure affecting pregnancy in third trimester, antepartum 08/02/2023   Nausea and vomiting during pregnancy 08/02/2023   Diarrhea during pregnancy 08/02/2023   Type 2 diabetes mellitus during pregnancy 08/02/2023   GBS (group B Streptococcus carrier), +RV culture, currently pregnant 07/23/2023   BMI 40.0-44.9, adult (HCC) 04/09/2023   Abnormal antenatal AFP screen 03/25/2023   Pre-existing type 2 diabetes mellitus in pregnancy 03/25/2023   Family history of spina bifida 02/27/2023   Supervision of high risk pregnancy, antepartum 02/22/2023   Lumbar radiculopathy 06/06/2022   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/2021   Schizophrenia (HCC) 07/17/2021   Depression 07/17/2021   Insulin -requiring or dependent type II diabetes mellitus (HCC)  07/17/2021    Allergies: No Known Allergies Medications:  Current Outpatient Medications:    acetaminophen  (TYLENOL ) 500 MG tablet, Take 2 tablets (1,000 mg total) by mouth every 8 (eight) hours., Disp: 30 tablet, Rfl: 0   cetirizine  (ZYRTEC  ALLERGY ) 10 MG tablet, Take 1 tablet (10 mg  total) by mouth daily as needed for allergies., Disp: 30 tablet, Rfl: 11   Continuous Glucose Sensor (FREESTYLE LIBRE 3 SENSOR) MISC, Place 1 sensor on the skin every 14 days. Use to check glucose continuously, Disp: 2 each, Rfl: 6   famotidine  (PEPCID ) 20 MG tablet, Take 1 tablet (20 mg total) by mouth 2 (two) times daily. (Patient not taking: Reported on 08/13/2023), Disp: 60 tablet, Rfl: 3   ferrous sulfate  325 (65 FE) MG tablet, Take 1 tablet (325 mg total) by mouth every other day., Disp: 90 tablet, Rfl: 3   ibuprofen  (ADVIL ) 800 MG tablet, Take 1 tablet (800 mg total) by mouth every 8 (eight) hours., Disp: 30 tablet, Rfl: 0   insulin  glargine (LANTUS  SOLOSTAR) 100 UNIT/ML Solostar Pen, Inject 30 Units into the skin at bedtime., Disp: , Rfl:    Insulin  Pen Needle (B-D UF III MINI PEN NEEDLES) 31G X 5 MM MISC, Use as instructed. Inject into the skin once dailyUse as instructed. Inject into the skin once daily (Changed Rx to QID on 06/29/23), Disp: 100 each, Rfl: 3   metFORMIN  (GLUCOPHAGE -XR) 500 MG 24 hr tablet, Take 1 tablet (500 mg total) by mouth 2 (two) times daily with a meal., Disp: 60 tablet, Rfl: 0   methocarbamol  (ROBAXIN ) 500 MG tablet, Take 1-2 tablets (500-1,000 mg total) by mouth every 8 (eight) hours as needed for muscle spasms. (Patient not taking: Reported on 09/18/2023), Disp: 20 tablet, Rfl: 0   Prenatal 28-0.8 MG TABS, Take 1 tablet by mouth daily., Disp: 30 tablet, Rfl: 12   senna-docusate (SENOKOT-S) 8.6-50 MG tablet, Take 2 tablets by mouth daily. (Patient not taking: Reported on 09/18/2023), Disp: 60 tablet, Rfl: 0  Observations/Objective: Patient is well-developed, well-nourished in no acute distress.  Resting comfortably  at home.  Head is normocephalic, atraumatic.  No labored breathing.  Speech is clear and coherent with logical content.  Patient is alert and oriented at baseline.    Assessment and Plan: There are no diagnoses linked to this encounter. Advised to do  in home covid test. Increase fluids humidifier at night. UC as needed.   Follow Up Instructions: I discussed the assessment and treatment plan with the patient. The patient was provided an opportunity to ask questions and all were answered. The patient agreed with the plan and demonstrated an understanding of the instructions.  A copy of instructions were sent to the patient via MyChart unless otherwise noted below.     The patient was advised to call back or seek an in-person evaluation if the symptoms worsen or if the condition fails to improve as anticipated.    Melissa Malson, FNP

## 2023-10-01 NOTE — Patient Instructions (Signed)

## 2023-10-02 ENCOUNTER — Ambulatory Visit
Admission: EM | Admit: 2023-10-02 | Discharge: 2023-10-02 | Disposition: A | Discharge status: Home / Self Care | Payer: MEDICAID | Attending: Family Medicine | Admitting: Family Medicine

## 2023-10-02 DIAGNOSIS — R519 Headache, unspecified: Secondary | ICD-10-CM | POA: Diagnosis not present

## 2023-10-02 DIAGNOSIS — J0140 Acute pansinusitis, unspecified: Secondary | ICD-10-CM

## 2023-10-02 MED ORDER — CELECOXIB 200 MG PO CAPS: 200.0000 mg | ORAL_CAPSULE | Freq: Two times a day (BID) | ORAL | 0 refills | Status: DC | PRN

## 2023-10-02 MED ORDER — CETIRIZINE HCL 10 MG PO TABS: 10.0000 mg | ORAL_TABLET | Freq: Every day | ORAL | 0 refills | Status: DC

## 2023-10-02 MED ORDER — PSEUDOEPHEDRINE HCL 30 MG PO TABS: 30.0000 mg | ORAL_TABLET | Freq: Three times a day (TID) | ORAL | 0 refills | Status: DC | PRN

## 2023-10-02 NOTE — Discharge Instructions (Signed)
 Make sure you pick up your prescription for the antibiotic Augmentin  as prescribed to you yesterday from your video visit. Use celecoxib  for moderate to severe headaches. Use Zyrtec  and Sudafed for your sinuses, sinus headaches. Hydrate well, 80 ounces of water  daily.

## 2023-10-02 NOTE — ED Triage Notes (Addendum)
 Pt c/o cough, head/chest congestion, body aches, fever, sore throat x 5 days-NAD-steady gait-pt had a video visit yesterday-dx wth sinusitis and bronchitis-did not get rx meds

## 2023-10-02 NOTE — ED Provider Notes (Signed)
 [facility]  Note:  This document was prepared using Conservation officer, historic buildings and may include unintentional dictation errors.  MRN: [id number] DOB: [birth year]  Subjective:   Melissa Gilmore is a 28 y.o. female presenting for 5 day history of acute onset sinus congestion, sinus headaches, fever, chills, hot flashes, throat pain, body aches, dizziness when she stands. Has had a slight cough. Takes insulin  for her diabetes. Is poorly controlled. Had a virtual visit yesterday, was prescribed Augmentin  and supportive care. Has not picked up the medication. Has used APAP and ibuprofen  for her headaches with minimal relief.  No history of neurologic conditions but she does have a history of migraines.  No current facility-administered medications for this encounter.  Current Outpatient Medications:    acetaminophen  (TYLENOL ) 500 MG tablet, Take 2 tablets (1,000 mg total) by mouth every 8 (eight) hours., Disp: 30 tablet, Rfl: 0   amoxicillin -clavulanate (AUGMENTIN ) 875-125 MG tablet, Take 1 tablet by mouth 2 (two) times daily., Disp: 20 tablet, Rfl: 0   cetirizine  (ZYRTEC  ALLERGY ) 10 MG tablet, Take 1 tablet (10 mg total) by mouth daily as needed for allergies., Disp: 30 tablet, Rfl: 11   Continuous Glucose Sensor (FREESTYLE LIBRE 3 SENSOR) MISC, Place 1 sensor on the skin every 14 days. Use to check glucose continuously, Disp: 2 each, Rfl: 6   famotidine  (PEPCID ) 20 MG tablet, Take 1 tablet (20 mg total) by mouth 2 (two) times daily. (Patient not taking: Reported on 08/13/2023), Disp: 60 tablet, Rfl: 3   ferrous sulfate  325 (65 FE) MG tablet, Take 1 tablet (325 mg total) by mouth every other day., Disp: 90 tablet, Rfl: 3   ibuprofen  (ADVIL ) 800 MG tablet, Take 1 tablet (800 mg total) by mouth every 8 (eight) hours., Disp: 30 tablet, Rfl: 0   insulin  glargine (LANTUS  SOLOSTAR) 100 UNIT/ML Solostar Pen, Inject 30 Units into the skin at bedtime., Disp: , Rfl:     Insulin  Pen Needle (B-D UF III MINI PEN NEEDLES) 31G X 5 MM MISC, Use as instructed. Inject into the skin once dailyUse as instructed. Inject into the skin once daily (Changed Rx to QID on 06/29/23), Disp: 100 each, Rfl: 3   metFORMIN  (GLUCOPHAGE -XR) 500 MG 24 hr tablet, Take 1 tablet (500 mg total) by mouth 2 (two) times daily with a meal., Disp: 60 tablet, Rfl: 0   methocarbamol  (ROBAXIN ) 500 MG tablet, Take 1-2 tablets (500-1,000 mg total) by mouth every 8 (eight) hours as needed for muscle spasms. (Patient not taking: Reported on 09/18/2023), Disp: 20 tablet, Rfl: 0   Prenatal 28-0.8 MG TABS, Take 1 tablet by mouth daily., Disp: 30 tablet, Rfl: 12   promethazine -dextromethorphan (PROMETHAZINE -DM) 6.25-15 MG/5ML syrup, Take 5 mLs by mouth 4 (four) times daily as needed for up to 10 days for cough., Disp: 118 mL, Rfl: 0   senna-docusate (SENOKOT-S) 8.6-50 MG tablet, Take 2 tablets by mouth daily. (Patient not taking: Reported on 09/18/2023), Disp: 60 tablet, Rfl: 0   No Known Allergies  Past Medical History:  Diagnosis Date   Acute cholecystitis with chronic cholecystitis 07/16/2021   Acute lumbar back pain 03/18/2022   Anxiety    Childhood asthma    Depression    Major Depression disorder   Diabetes mellitus without complication [facility])    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HCC)    Dyslipidemia 07/17/2021   Headache    Migraines   History of abnormal uterine bleeding 07/17/2021   Schizophrenia (HCC)  Syncope 03/23/2022   Urticaria      Past Surgical History:  Procedure Laterality Date   CESAREAN SECTION N/A 08/05/2023   Procedure: CESAREAN DELIVERY;  Surgeon: Jayne Vonn DEL, MD;  Location: [facility] LD ORS;  Service: Obstetrics;  Laterality: N/A;   CHOLECYSTECTOMY N/A 07/17/2021   Procedure: SINGLE SITE LAPAROSCOPIC CHOLECYSTECTOMY WITH INTRAOPERATIVE CHOLANGIOGRAM; LIVER BIOPSY;  Surgeon: Sheldon Standing, MD;  Location: [facility] ORS;  Service: General;  Laterality: N/A;   IR EPIDUROGRAPHY  03/21/2022    LUMBAR LAMINECTOMY/ DECOMPRESSION WITH MET-RX Right 06/06/2022   Procedure: Right Lumbar three-four Minimally Invasive Surgery Laminectomy/Discectomy;  Surgeon: Cheryle Debby LABOR, MD;  Location: [facility] OR;  Service: Neurosurgery;  Laterality: Right;   WISDOM TOOTH EXTRACTION      Family History  Problem Relation Age of Onset   Allergic rhinitis Father    Diabetes Maternal Grandmother    Stomach cancer Maternal Grandmother     Social History   Tobacco Use   Smoking status: Never    Passive exposure: Never   Smokeless tobacco: Never  Vaping Use   Vaping status: Never Used  Substance Use Topics   Alcohol use: Not Currently   Drug use: Never    ROS   Objective:   Vitals: BP 130/74 (BP Location: Right Arm)   Pulse 72   Temp 98.6 F (37 C) (Oral)   Resp 19   LMP 09/16/2023 (Exact Date)   SpO2 98%   Breastfeeding No   Physical Exam Constitutional:      General: She is not in acute distress.    Appearance: Normal appearance. She is well-developed and normal weight. She is not ill-appearing, toxic-appearing or diaphoretic.  HENT:     Head: Normocephalic and atraumatic.     Right Ear: Tympanic membrane, ear canal and external ear normal. No drainage or tenderness. No middle ear effusion. There is no impacted cerumen. Tympanic membrane is not erythematous or bulging.     Left Ear: Tympanic membrane, ear canal and external ear normal. No drainage or tenderness.  No middle ear effusion. There is no impacted cerumen. Tympanic membrane is not erythematous or bulging.     Nose: Congestion and rhinorrhea present.     Mouth/Throat:     Mouth: Mucous membranes are moist. No oral lesions.     Pharynx: No pharyngeal swelling, oropharyngeal exudate, posterior oropharyngeal erythema or uvula swelling.     Tonsils: No tonsillar exudate or tonsillar abscesses.  Eyes:     General: No scleral icterus.       Right eye: No discharge.        Left eye: No discharge.     Extraocular  Movements: Extraocular movements intact.     Right eye: Normal extraocular motion.     Left eye: Normal extraocular motion.     Conjunctiva/sclera: Conjunctivae normal.  Neck:     Meningeal: Brudzinski's sign and Kernig's sign absent.  Cardiovascular:     Rate and Rhythm: Normal rate.  Pulmonary:     Effort: Pulmonary effort is normal.  Musculoskeletal:     Cervical back: Normal range of motion and neck supple.  Lymphadenopathy:     Cervical: No cervical adenopathy.  Skin:    General: Skin is warm and dry.  Neurological:     General: No focal deficit present.     Mental Status: She is alert and oriented to person, place, and time.     Cranial Nerves: No cranial nerve deficit, dysarthria or facial asymmetry.  Motor: No weakness or pronator drift.     Coordination: Romberg sign negative. Coordination normal. Finger-Nose-Finger Test and Heel to [facility] Test normal. Rapid alternating movements normal.     Gait: Gait and tandem walk normal.     Deep Tendon Reflexes: Reflexes normal.  Psychiatric:        Mood and Affect: Mood normal.        Behavior: Behavior normal.        Thought Content: Thought content normal.        Judgment: Judgment normal.     Assessment and Plan :   PDMP not reviewed this encounter.  1. Sinus headache   2. Acute non-recurrent pansinusitis    Recommended following the treatment plan from her visit yesterday.  No signs of an acute encephalopathy.  Vital signs hemodynamically stable for outpatient management.  Recommend managing with supportive care, celecoxib  for her severe headaches.  Use Zyrtec  and pseudoephedrine  for her sinusitis symptoms.  Counseled patient on potential for adverse effects with medications prescribed/recommended today, ER and return-to-clinic precautions discussed, patient verbalized understanding.    Christopher Savannah, [place] 10/02/23 1151

## 2023-10-05 ENCOUNTER — Encounter: Payer: Self-pay | Admitting: Family Medicine

## 2023-10-05 ENCOUNTER — Ambulatory Visit (INDEPENDENT_AMBULATORY_CARE_PROVIDER_SITE_OTHER): Payer: MEDICAID | Admitting: Family Medicine

## 2023-10-05 VITALS — BP 113/80 | HR 67 | Ht 64.0 in | Wt 231.0 lb

## 2023-10-05 DIAGNOSIS — Z794 Long term (current) use of insulin: Secondary | ICD-10-CM | POA: Diagnosis not present

## 2023-10-05 DIAGNOSIS — Z6839 Body mass index (BMI) 39.0-39.9, adult: Secondary | ICD-10-CM

## 2023-10-05 DIAGNOSIS — E1165 Type 2 diabetes mellitus with hyperglycemia: Secondary | ICD-10-CM

## 2023-10-05 DIAGNOSIS — E6609 Other obesity due to excess calories: Secondary | ICD-10-CM

## 2023-10-05 DIAGNOSIS — E66812 Obesity, class 2: Secondary | ICD-10-CM

## 2023-10-05 LAB — POCT GLYCOSYLATED HEMOGLOBIN (HGB A1C): Hemoglobin A1C: 7.3 % — AB (ref 4.0–5.6)

## 2023-10-05 MED ORDER — OZEMPIC (0.25 OR 0.5 MG/DOSE) 2 MG/3ML ~~LOC~~ SOPN
0.2500 mg | PEN_INJECTOR | SUBCUTANEOUS | 2 refills | Status: DC
Start: 2023-10-05 — End: 2023-12-06

## 2023-10-05 MED ORDER — FREESTYLE LIBRE 3 PLUS SENSOR MISC: 3 refills | Status: DC

## 2023-10-09 ENCOUNTER — Encounter: Payer: Self-pay | Admitting: Family Medicine

## 2023-10-09 NOTE — Progress Notes (Signed)
 Established Patient Office Visit  Subjective    Patient ID: Melissa Gilmore, female    DOB: [birth year]  Age: 28 y.o. MRN: [id number]  CC:  Chief Complaint  Patient presents with   Hospitalization Follow-up    Pt reports wanting to start ozempic . She also stated the company that makes her sensors to check her blood sugar would not be making them anymore so she needs a different prescription.     HPI Melissa Gilmore [facility] presents for follow up of diabetes. She is 2 months post partum and was a gestational diabetic.   Outpatient Encounter Medications as of 10/05/2023  Medication Sig   acetaminophen  (TYLENOL ) 500 MG tablet Take 2 tablets (1,000 mg total) by mouth every 8 (eight) hours.   amoxicillin -clavulanate (AUGMENTIN ) 875-125 MG tablet Take 1 tablet by mouth 2 (two) times daily.   celecoxib  (CELEBREX ) 200 MG capsule Take 1 capsule (200 mg total) by mouth 2 (two) times daily as needed.   cetirizine  (ZYRTEC  ALLERGY ) 10 MG tablet Take 1 tablet (10 mg total) by mouth daily.   Continuous Glucose Sensor (FREESTYLE LIBRE 3 PLUS SENSOR) MISC Change sensor every 15 days.   ferrous sulfate  325 (65 FE) MG tablet Take 1 tablet (325 mg total) by mouth every other day.   insulin  glargine (LANTUS  SOLOSTAR) 100 UNIT/ML Solostar Pen Inject 30 Units into the skin at bedtime.   Insulin  Pen Needle (B-D UF III MINI PEN NEEDLES) 31G X 5 MM MISC Use as instructed. Inject into the skin once dailyUse as instructed. Inject into the skin once daily (Changed Rx to QID on 06/29/23)   metFORMIN  (GLUCOPHAGE -XR) 500 MG 24 hr tablet Take 1 tablet (500 mg total) by mouth 2 (two) times daily with a meal.   Prenatal 28-0.8 MG TABS Take 1 tablet by mouth daily.   promethazine -dextromethorphan (PROMETHAZINE -DM) 6.25-15 MG/5ML syrup Take 5 mLs by mouth 4 (four) times daily as needed for up to 10 days for cough.   pseudoephedrine  (SUDAFED) 30 MG tablet Take 1 tablet (30 mg total) by mouth every 8 (eight) hours  as needed for congestion.   Semaglutide ,0.25 or 0.5MG /DOS, (OZEMPIC , 0.25 OR 0.5 MG/DOSE,) 2 MG/3ML SOPN Inject 0.25 mg into the skin once a week.   [DISCONTINUED] Continuous Glucose Sensor (FREESTYLE LIBRE 3 SENSOR) MISC Place 1 sensor on the skin every 14 days. Use to check glucose continuously   famotidine  (PEPCID ) 20 MG tablet Take 1 tablet (20 mg total) by mouth 2 (two) times daily. (Patient not taking: Reported on 08/13/2023)   ibuprofen  (ADVIL ) 800 MG tablet Take 1 tablet (800 mg total) by mouth every 8 (eight) hours.   methocarbamol  (ROBAXIN ) 500 MG tablet Take 1-2 tablets (500-1,000 mg total) by mouth every 8 (eight) hours as needed for muscle spasms. (Patient not taking: Reported on 09/18/2023)   senna-docusate (SENOKOT-S) 8.6-50 MG tablet Take 2 tablets by mouth daily. (Patient not taking: Reported on 09/18/2023)   No facility-administered encounter medications on file as of 10/05/2023.    Past Medical History:  Diagnosis Date   Acute cholecystitis with chronic cholecystitis 07/16/2021   Acute lumbar back pain 03/18/2022   Anxiety    Childhood asthma    Depression    Major Depression disorder   Diabetes mellitus without complication [facility])    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HCC)    Dyslipidemia 07/17/2021   Headache    Migraines   History of abnormal uterine bleeding 07/17/2021   Schizophrenia (HCC)    Syncope 03/23/2022  Urticaria     Past Surgical History:  Procedure Laterality Date   CESAREAN SECTION N/A 08/05/2023   Procedure: CESAREAN DELIVERY;  Surgeon: Jayne Vonn DEL, MD;  Location: [facility] LD ORS;  Service: Obstetrics;  Laterality: N/A;   CHOLECYSTECTOMY N/A 07/17/2021   Procedure: SINGLE SITE LAPAROSCOPIC CHOLECYSTECTOMY WITH INTRAOPERATIVE CHOLANGIOGRAM; LIVER BIOPSY;  Surgeon: Sheldon Standing, MD;  Location: [facility] ORS;  Service: General;  Laterality: N/A;   IR EPIDUROGRAPHY  03/21/2022   LUMBAR LAMINECTOMY/ DECOMPRESSION WITH MET-RX Right 06/06/2022   Procedure: Right  Lumbar three-four Minimally Invasive Surgery Laminectomy/Discectomy;  Surgeon: Cheryle Debby LABOR, MD;  Location: [facility] OR;  Service: Neurosurgery;  Laterality: Right;   WISDOM TOOTH EXTRACTION      Family History  Problem Relation Age of Onset   Allergic rhinitis Father    Diabetes Maternal Grandmother    Stomach cancer Maternal Grandmother     Social History   Socioeconomic History   Marital status: Single    Spouse name: Not on file   Number of children: Not on file   Years of education: Not on file   Highest education level: 11th grade  Occupational History   Not on file  Tobacco Use   Smoking status: Never    Passive exposure: Never   Smokeless tobacco: Never  Vaping Use   Vaping status: Never Used  Substance and Sexual Activity   Alcohol use: Not Currently   Drug use: Never   Sexual activity: Yes    Birth control/protection: Injection  Other Topics Concern   Not on file  Social History Narrative   Not on file   Social Drivers of Health   Financial Resource Strain: Medium Risk (12/12/2022)   Overall Financial Resource Strain (CARDIA)    Difficulty of Paying Living Expenses: Somewhat hard  Food Insecurity: Food Insecurity Present (08/03/2023)   Hunger Vital Sign    Worried About Running Out of Food in the Last Year: Sometimes true    Ran Out of Food in the Last Year: Sometimes true  Transportation Needs: No Transportation Needs (08/03/2023)   PRAPARE - Administrator, Civil Service (Medical): No    Lack of Transportation (Non-Medical): No  Physical Activity: Insufficiently Active (12/12/2022)   Exercise Vital Sign    Days of Exercise per Week: 3 days    Minutes of Exercise per Session: 30 min  Stress: No Stress Concern Present (12/12/2022)   [employer] of Occupational Health - Occupational Stress Questionnaire    Feeling of Stress : Only a little  Social Connections: Moderately Isolated (12/12/2022)   Social Connection and Isolation Panel     Frequency of Communication with Friends and Family: Three times a week    Frequency of Social Gatherings with Friends and Family: Twice a week    Attends Religious Services: Never    Database administrator or Organizations: No    Attends Engineer, structural: Not on file    Marital Status: Living with partner  Intimate Partner Violence: Not At Risk (08/03/2023)   Humiliation, Afraid, Rape, and Kick questionnaire    Fear of Current or Ex-Partner: No    Emotionally Abused: No    Physically Abused: No    Sexually Abused: No    Review of Systems  All other systems reviewed and are negative.       Objective    BP 113/80   Pulse 67   Ht 5' 4 (1.626 m)   Wt 231 lb (104.8 kg)  LMP 09/16/2023 (Exact Date)   SpO2 98%   Breastfeeding No   BMI 39.65 kg/m   Physical Exam Vitals and nursing note reviewed.  Constitutional:      General: She is not in acute distress.    Appearance: She is obese.  Cardiovascular:     Rate and Rhythm: Normal rate and regular rhythm.  Pulmonary:     Effort: Pulmonary effort is normal.     Breath sounds: Normal breath sounds.  Abdominal:     Palpations: Abdomen is soft.     Tenderness: There is no abdominal tenderness.  Neurological:     General: No focal deficit present.     Mental Status: She is alert and oriented to person, place, and time.         Assessment & Plan:   Type 2 diabetes mellitus with hyperglycemia, with long-term current use of insulin  (HCC) -     POCT glycosylated hemoglobin (Hb A1C)  Class 2 obesity due to excess calories without serious comorbidity with body mass index (BMI) of 39.0 to 39.9 in adult  Other orders -     FreeStyle Libre 3 Plus Sensor; Change sensor every 15 days.  Dispense: 2 each; Refill: 3 -     Ozempic  (0.25 or 0.5 MG/DOSE); Inject 0.25 mg into the skin once a week.  Dispense: 3 mL; Refill: 2     Return in about 3 months (around 01/05/2024) for follow up.   Tanda Raguel SQUIBB, MD

## 2023-11-09 ENCOUNTER — Telehealth: Payer: Self-pay

## 2023-11-09 NOTE — Telephone Encounter (Signed)
 Pharmacy Patient Advocate Encounter  Received notification from [employer] [place] [employer] that Prior Authorization for OZEMPIC  has been APPROVED from 11/09/2023 to 11/08/2024

## 2023-11-12 ENCOUNTER — Other Ambulatory Visit (HOSPITAL_COMMUNITY): Payer: Self-pay | Admitting: Student

## 2023-11-12 DIAGNOSIS — M5416 Radiculopathy, lumbar region: Secondary | ICD-10-CM

## 2023-11-16 ENCOUNTER — Other Ambulatory Visit: Payer: Self-pay

## 2023-11-16 ENCOUNTER — Ambulatory Visit (HOSPITAL_COMMUNITY): Admission: RE | Admit: 2023-11-16 | Payer: MEDICAID | Source: Ambulatory Visit

## 2023-11-18 ENCOUNTER — Ambulatory Visit (HOSPITAL_COMMUNITY)
Admission: RE | Admit: 2023-11-18 | Discharge: 2023-11-18 | Disposition: A | Discharge status: Home / Self Care | Payer: MEDICAID | Source: Ambulatory Visit | Attending: Student | Admitting: Student

## 2023-11-18 ENCOUNTER — Encounter (HOSPITAL_COMMUNITY): Payer: Self-pay

## 2023-11-18 DIAGNOSIS — M51369 Other intervertebral disc degeneration, lumbar region without mention of lumbar back pain or lower extremity pain: Secondary | ICD-10-CM

## 2023-11-18 DIAGNOSIS — M5416 Radiculopathy, lumbar region: Secondary | ICD-10-CM | POA: Diagnosis present

## 2023-11-18 MED ORDER — GADOBUTROL 1 MMOL/ML IV SOLN
10.0000 mL | Freq: Once | INTRAVENOUS | Status: AC | PRN
  Administered 2023-11-18: 10 mL via INTRAVENOUS

## 2023-11-25 NOTE — Progress Notes (Deleted)
 S:     No chief complaint on file.  28 y.o. female who presents for diabetes evaluation, education, and management. PMH is significant for T2DM, obesity, depression. Patient was referred and last seen by Primary Care Provider, Dr. Tanda, on 10/05/23. At last visit, patient requested to be started on Ozempic . Prior authorization was initiated for patient and approved 11/09/23.   Patient arrives in *** good spirits and presents without *** any assistance. ***Patient is accompanied by ***.   Family/Social History: ***  Current diabetes medications include: Lantus  30 units daily, metformin  500 mg BID, starting Ozempic  0.25 mg weekly  Patient reports adherence to taking all medications as prescribed.  *** Patient denies adherence with medications, reports missing *** medications *** times per week, on average.  Do you feel that your medications are working for you? {YES NO:22349} Have you been experiencing any side effects to the medications prescribed? {YES NO:22349} Do you have any problems obtaining medications due to transportation or finances? {YES WN:77650}  Insurance coverage: [employer]  Patient {Actions; denies-reports:120008} hypoglycemic events.  Reported home fasting blood sugars: ***  Reported 2 hour post-meal/random blood sugars: ***.  Patient {Actions; denies-reports:120008} nocturia (nighttime urination).  Patient {Actions; denies-reports:120008} neuropathy (nerve pain). Patient {Actions; denies-reports:120008} visual changes. Patient {Actions; denies-reports:120008} self foot exams.   Patient reported dietary habits: Eats *** meals/day Breakfast: *** Lunch: *** Dinner: *** Snacks: *** Drinks: ***  Within the past 12 months, did you worry whether your food would run out before you got money to buy more? {YES NO:22349} Within the past 12 months, did the food you bought run out, and you didn't have money to get more? {YES NO:22349} PHQ-9 Score:  ***  Patient-reported exercise habits: ***  O:   ROS  Physical Exam  7 day average blood glucose: ***  Libre3 CGM Download today *** % Time CGM is active: ***% Average Glucose: *** mg/dL Glucose Management Indicator: ***  Glucose Variability: ***% (goal <36%) Time in Goal:  - Time in range 70-180: ***% - Time above range: ***% - Time below range: ***% Observed patterns:  Lab Results  Component Value Date   HGBA1C 7.3 (A) 10/05/2023   There were no vitals filed for this visit.  Lipid Panel     Component Value Date/Time   CHOL 170 12/14/2022 1458   TRIG 116 12/14/2022 1458   HDL 41 12/14/2022 1458   CHOLHDL 4.1 12/14/2022 1458   LDLCALC 108 (H) 12/14/2022 1458    Clinical Atherosclerotic Cardiovascular Disease (ASCVD): {YES/NO:21197} The ASCVD Risk score (Arnett DK, et al., 2019) failed to calculate for the following reasons:   The 2019 ASCVD risk score is only valid for ages 2 to 106   Patient is participating in a Managed Medicaid Plan:  Yes   A/P: Diabetes longstanding *** currently ***. Patient is *** able to verbalize appropriate hypoglycemia management plan. Medication adherence appears ***. Control is suboptimal due to ***. -{Meds adjust:18428} basal insulin  *** Lantus /Basaglar /Semglee  (insulin  glargine) *** Tresiba (insulin  degludec) from *** units to *** units daily in the morning. Patient will continue to titrate 1 unit every *** days if fasting blood sugar > 100mg /dl until fasting blood sugars reach goal or next visit.  -{Meds adjust:18428} rapid insulin  *** Novolog  (insulin  aspart) *** Humalog (insulin  lispro) from *** to ***.  -{Meds adjust:18428} GLP-1 *** Trulicity (dulaglutide) *** Ozempic  (semaglutide ) *** Mounjaro (tirzepatide) from *** mg to *** mg .  -{Meds adjust:18428} SGLT2-I *** Farxiga (dapagliflozin) *** Jardiance (empagliflozin) 10 mg.  Counseled on sick day rules. -{Meds adjust:18428} metformin  ***.  -Patient educated on purpose, proper  use, and potential adverse effects of ***.  -Extensively discussed pathophysiology of diabetes, recommended lifestyle interventions, dietary effects on blood sugar control.  -Counseled on s/sx of and management of hypoglycemia.  -Next A1c anticipated ***.   ASCVD risk - primary ***secondary prevention in patient with diabetes. Last LDL is *** not at goal of <29 *** mg/dL. ASCVD risk factors include *** and 10-year ASCVD risk score of ***. {Desc; low/moderate/high:110033} intensity statin indicated.  -{Meds adjust:18428} ***statin *** mg.   Hypertension longstanding *** currently ***. Blood pressure goal of <130/80 *** mmHg. Medication adherence ***. Blood pressure control is suboptimal due to ***. -{Meds adjust:18428} *** mg.  Written patient instructions provided. Patient verbalized understanding of treatment plan.  Total time in face to face counseling *** minutes.    Follow-up:  Pharmacist *** PCP clinic visit should be scheduled around 01/05/24 with Dr. Tanda Patient seen with ***  Jenkins Graces, PharmD PGY1 Pharmacy Resident [phone]

## 2023-11-26 ENCOUNTER — Ambulatory Visit: Payer: MEDICAID | Admitting: Pharmacist

## 2023-12-04 ENCOUNTER — Ambulatory Visit (INDEPENDENT_AMBULATORY_CARE_PROVIDER_SITE_OTHER): Payer: MEDICAID

## 2023-12-04 VITALS — BP 129/71 | HR 72 | Ht 64.0 in | Wt 240.6 lb

## 2023-12-04 DIAGNOSIS — Z3042 Encounter for surveillance of injectable contraceptive: Secondary | ICD-10-CM | POA: Diagnosis not present

## 2023-12-04 LAB — POCT PREGNANCY, URINE: Preg Test, Ur: NEGATIVE

## 2023-12-04 MED ORDER — MEDROXYPROGESTERONE ACETATE 150 MG/ML IM SUSY
150.0000 mg | PREFILLED_SYRINGE | Freq: Once | INTRAMUSCULAR | Status: AC
  Administered 2023-12-04: 150 mg via INTRAMUSCULAR

## 2023-12-04 NOTE — Progress Notes (Signed)
 Melissa Gilmore here for Depo-Provera  Injection. Last injection administered 09/18/23. Patient requested pregnancy test; UPT negative.Today Injection administered without complication. Patient will return in 3 months for next injection between 02/19/24 and 03/04/24.   Patient states she has had a lot of abdominal cramping and has been using Midol  and Advil  for relief, but it only helps for 30 minutes before cramping returns. Patient has an appointment scheduled 12/06/23 for abdominal cramping. Patient states she has no further questions or concerns.   [place], RN 12/04/2023

## 2023-12-06 ENCOUNTER — Ambulatory Visit: Payer: MEDICAID | Attending: Family Medicine | Admitting: Pharmacist

## 2023-12-06 ENCOUNTER — Encounter: Payer: Self-pay | Admitting: Pharmacist

## 2023-12-06 ENCOUNTER — Other Ambulatory Visit: Payer: Self-pay

## 2023-12-06 ENCOUNTER — Telehealth: Payer: Self-pay | Admitting: Family Medicine

## 2023-12-06 ENCOUNTER — Encounter: Payer: Self-pay | Admitting: Obstetrics and Gynecology

## 2023-12-06 ENCOUNTER — Ambulatory Visit (INDEPENDENT_AMBULATORY_CARE_PROVIDER_SITE_OTHER): Payer: MEDICAID | Admitting: Obstetrics and Gynecology

## 2023-12-06 VITALS — BP 117/82 | HR 102 | Wt 236.8 lb

## 2023-12-06 DIAGNOSIS — E1165 Type 2 diabetes mellitus with hyperglycemia: Secondary | ICD-10-CM

## 2023-12-06 DIAGNOSIS — R8290 Unspecified abnormal findings in urine: Secondary | ICD-10-CM

## 2023-12-06 DIAGNOSIS — R102 Pelvic and perineal pain unspecified side: Secondary | ICD-10-CM

## 2023-12-06 DIAGNOSIS — Z7985 Long-term (current) use of injectable non-insulin antidiabetic drugs: Secondary | ICD-10-CM

## 2023-12-06 DIAGNOSIS — M791 Myalgia, unspecified site: Secondary | ICD-10-CM

## 2023-12-06 DIAGNOSIS — Z794 Long term (current) use of insulin: Secondary | ICD-10-CM

## 2023-12-06 DIAGNOSIS — G8929 Other chronic pain: Secondary | ICD-10-CM

## 2023-12-06 LAB — POCT URINALYSIS DIP (DEVICE)
Bilirubin Urine: NEGATIVE
Glucose, UA: >=1000 mg/dL — AB
Ketones, ur: NEGATIVE mg/dL
Leukocytes,Ua: NEGATIVE
Nitrite: POSITIVE — AB
Protein, ur: NEGATIVE mg/dL
Specific Gravity, Urine: 1.015 (ref 1.005–1.030)
Urobilinogen, UA: 0.2 mg/dL (ref 0.0–1.0)
pH: 6 (ref 5.0–8.0)

## 2023-12-06 MED ORDER — OZEMPIC (0.25 OR 0.5 MG/DOSE) 2 MG/3ML ~~LOC~~ SOPN
0.5000 mg | PEN_INJECTOR | SUBCUTANEOUS | 2 refills | Status: DC
  Filled 2023-12-06: qty 3, 28d supply, fill #0
  Filled 2023-12-29: qty 3, 28d supply, fill #1

## 2023-12-06 MED ORDER — CYCLOBENZAPRINE HCL 10 MG PO TABS: 10.0000 mg | ORAL_TABLET | Freq: Two times a day (BID) | ORAL | 1 refills | Status: DC | PRN

## 2023-12-06 MED ORDER — LANTUS SOLOSTAR 100 UNIT/ML ~~LOC~~ SOPN
30.0000 [IU] | PEN_INJECTOR | Freq: Every day | SUBCUTANEOUS | 2 refills | Status: DC
  Filled 2023-12-06: qty 15, 50d supply, fill #0
  Filled 2023-12-29: qty 15, 50d supply, fill #1

## 2023-12-06 MED ORDER — FREESTYLE LIBRE 3 PLUS SENSOR MISC
6 refills | Status: AC
  Filled 2023-12-06: qty 2, 30d supply, fill #0
  Filled 2023-12-29: qty 2, 30d supply, fill #1
  Filled 2024-01-29: qty 2, 30d supply, fill #2
  Filled 2024-03-14: qty 2, 30d supply, fill #3

## 2023-12-06 NOTE — Telephone Encounter (Signed)
 Mom said her and baby came in to see lactation and a referral was placed for a mouth specialist. She found a different office she would like to go to so she will need a referral to the new office. [facility] is where she will need a new referral to.

## 2023-12-06 NOTE — Progress Notes (Signed)
    S:     No chief complaint on file.  28 y.o. female who presents for diabetes evaluation, education, and management. PMH is significant for T2DM, obesity, depression. Patient was referred and last seen by Primary Care Provider, Dr. Tanda, on 10/05/23 (A1c 7.3%). At last visit, patient requested to be started on Ozempic . Prior authorization was initiated for patient and approved 11/09/23.   Patient arrives in good spirits and presents without any assistance. I saw her several times in 2024. We utilized Ozempic  and Lantus  to help her A1c improve from 11 to 7.7%. She then found out she was pregnant in late December 2024. Was taken off of Ozempic  and maintained strict control with Basal-Bolus insulin . A1c in 2025 has ranged from 5.7 - 7.3%. She delivered via cesarean 08/05/23.   Today, she reports that she's doing well. Both mom and baby are healthy. She tells me that since delivery over the summer, she restarted Ozempic  last month. Has taken four 0.25 mg injections. Denies any NV, abdominal pain. Denies any changes in vision.   Family/Social History:  Fhx:  no pertinent positives  Tobacco: never smoker  Alcohol: none reported  Current diabetes medications include: Lantus  30 units daily, Ozempic  0.25 mg weekly Patient reports adherence to taking all medications as prescribed.  Insurance coverage: [employer]  Patient denies hypoglycemic events.  Patient denies nocturia (nighttime urination).  Patient reports rare symptoms of neuropathy (nerve pain). Patient denies visual changes. Patient reports self foot exams.   Patient reported dietary habits:  -Reports decrease in appetite since restarting the Ozempic .   O:   Lab Results  Component Value Date   HGBA1C 7.3 (A) 10/05/2023   There were no vitals filed for this visit.  Lipid Panel     Component Value Date/Time   CHOL 170 12/14/2022 1458   TRIG 116 12/14/2022 1458   HDL 41 12/14/2022 1458   CHOLHDL 4.1 12/14/2022 1458    LDLCALC 108 (H) 12/14/2022 1458    Clinical Atherosclerotic Cardiovascular Disease (ASCVD): No  The ASCVD Risk score (Arnett DK, et al., 2019) failed to calculate for the following reasons:   The 2019 ASCVD risk score is only valid for ages 39 to 33   Patient is participating in a Managed Medicaid Plan:  Yes   A/P: Diabetes longstanding currently above goal given last A1c of 7.3%. Patient is not currently hypoglycemic but is able to verbalize appropriate hypoglycemia management plan. Medication adherence appears to be appropriate. -Increased dose of Ozempic  to 0.5 mg weekly.  -Continued Lantus  30 units daily.  -Patient educated on purpose, proper use, and potential adverse effects of Ozempic .  -Extensively discussed pathophysiology of diabetes, recommended lifestyle interventions, dietary effects on blood sugar control.  -Counseled on s/sx of and management of hypoglycemia.  -Next A1c anticipated 12/2023.   Written patient instructions provided. Patient verbalized understanding of treatment plan.  Total time in face to face counseling 30 minutes.    Follow-up:  Pharmacist in 1 month.  Melissa Gilmore, PharmD, Melissa [place], CPP Clinical Pharmacist [facility] & [facility] [phone]

## 2023-12-06 NOTE — Progress Notes (Unsigned)
 GYNECOLOGY VISIT  Patient name: Melissa Gilmore MRN [id number]  Date of birth: Aug 16, 1995 Chief Complaint:   Gynecologic Exam  History:  Melissa Gilmore here for cramping for about 1.5 weeks. Pain started suddenly. Reports having normal cramps initially and instead of resolving reports they continued and were much worse than prior an similar to when staff would push on her abdomen after delivery. Will lie in bed in fetal position due to the pain. Not having heavy bleeding but having some spotting present - on depo for contraception. No fever or chills. No abnormal discharge. Currently sexually active with female partner. No prior experience of pain with intercourse until 1.5 weeks ago. Stopped having intercourse for a few days to see if that was the case and still had the pain despite stopping. No new constipation or diarrhea. Restarted ozempic  about 1.5 months ago. Pain is moving around the abdomen - feels like a stabbing sensation when it's moving but when the entire abodmen affected feels more like being pushed. Improvement with midol  for about 30 minutes. No burning with urination or blood in the urine.    The following portions of the patient's history were reviewed and updated as appropriate: allergies, current medications, past family history, past medical history, past social history, past surgical history and problem list.   Health Maintenance:   Last pap     Component Value Date/Time   DIAGPAP  02/05/2023 1020    - Negative for intraepithelial lesion or malignancy (NILM)   HPVHIGH Negative 02/05/2023 1020   ADEQPAP  02/05/2023 1020    Satisfactory for evaluation; transformation zone component PRESENT.    Health Maintenance  Topic Date Due   Complete foot exam   Never done   Hepatitis B Vaccine (1 of 3 - 19+ 3-dose series) Never done   Eye exam for diabetics  06/21/2023   Flu Shot  09/07/2023   Yearly kidney health urinalysis for diabetes  09/29/2023    COVID-19 Vaccine (3 - 2025-26 season) 10/08/2023   HPV Vaccine (1 - 3-dose SCDM series) 12/01/2023   Hemoglobin A1C  04/05/2024   Yearly kidney function blood test for diabetes  08/30/2024   Pap Smear  02/04/2026   Pneumococcal Vaccine  Completed   Hepatitis C Screening  Completed   HIV Screening  Completed   Meningitis B Vaccine  Aged Out   DTaP/Tdap/Td vaccine  Discontinued      Review of Systems:  Pertinent items are noted in HPI. Comprehensive review of systems was otherwise negative.   Objective:  Physical Exam BP (!) 137/92   Pulse (!) 120   Wt 236 lb 12.8 oz (107.4 kg)   BMI 40.65 kg/m    Physical Exam Vitals and nursing note reviewed. Exam conducted with a chaperone present.  Constitutional:      Appearance: Normal appearance.  HENT:     Head: Normocephalic and atraumatic.  Pulmonary:     Effort: Pulmonary effort is normal.  Abdominal:     Comments: + carnett in lower abdomen, focused to either side of midline  No rebound or guarding   Genitourinary:    General: Normal vulva.     Comments: Normal appearing vulva Posterior vaginal wall tender - stool palpated within vault Anal wink present  Normal vulvar sensation Nontender superficial muscles Nontender ischial tuberosities bilaterally No allodynia Nontender right pelvic floor Nontender uterus    Skin:    General: Skin is warm and dry.  Neurological:  General: No focal deficit present.     Mental Status: She is alert.  Psychiatric:        Mood and Affect: Mood normal.        Behavior: Behavior normal.        Thought Content: Thought content normal.        Judgment: Judgment normal.        Assessment & Plan:   1. Chronic pelvic pain in female (Primary) 2. Myalgia Abdominopelvic myalgia on exam. Trial of muscle relaxer to alleviate pain. Referral to PFPT to address pain as well.  - cyclobenzaprine  (FLEXERIL ) 10 MG tablet; Take 1 tablet (10 mg total) by mouth 2 (two) times daily as needed  for muscle spasms.  Dispense: 30 tablet; Refill: 1 - Ambulatory referral to Physical Therapy  3. Abnormal urinalysis UC to assess for acute cystitis as possible source of pain - Urine Culture    Carter Quarry, MD Minimally Invasive Gynecologic Surgery [place] for [facility], [facility]

## 2023-12-07 ENCOUNTER — Encounter: Payer: Self-pay | Admitting: Lactation Services

## 2023-12-09 LAB — URINE CULTURE

## 2023-12-10 ENCOUNTER — Ambulatory Visit: Payer: Self-pay | Admitting: Obstetrics and Gynecology

## 2023-12-10 DIAGNOSIS — R8290 Unspecified abnormal findings in urine: Secondary | ICD-10-CM

## 2023-12-10 MED ORDER — NITROFURANTOIN MONOHYD MACRO 100 MG PO CAPS: 100.0000 mg | ORAL_CAPSULE | Freq: Two times a day (BID) | ORAL | 1 refills | Status: DC

## 2023-12-19 ENCOUNTER — Ambulatory Visit: Payer: MEDICAID

## 2023-12-31 ENCOUNTER — Other Ambulatory Visit: Payer: Self-pay

## 2023-12-31 ENCOUNTER — Other Ambulatory Visit (HOSPITAL_COMMUNITY): Payer: Self-pay

## 2024-01-10 ENCOUNTER — Ambulatory Visit: Payer: MEDICAID | Admitting: Pharmacist

## 2024-01-11 ENCOUNTER — Encounter: Payer: Self-pay | Admitting: Pharmacist

## 2024-01-11 ENCOUNTER — Ambulatory Visit: Payer: MEDICAID | Attending: Family Medicine | Admitting: Pharmacist

## 2024-01-11 ENCOUNTER — Other Ambulatory Visit: Payer: Self-pay

## 2024-01-11 DIAGNOSIS — E1165 Type 2 diabetes mellitus with hyperglycemia: Secondary | ICD-10-CM | POA: Diagnosis not present

## 2024-01-11 DIAGNOSIS — Z794 Long term (current) use of insulin: Secondary | ICD-10-CM | POA: Diagnosis not present

## 2024-01-11 DIAGNOSIS — Z7985 Long-term (current) use of injectable non-insulin antidiabetic drugs: Secondary | ICD-10-CM

## 2024-01-11 LAB — POCT GLYCOSYLATED HEMOGLOBIN (HGB A1C): HbA1c, POC (controlled diabetic range): 8.4 % — AB (ref 0.0–7.0)

## 2024-01-11 MED ORDER — ACCU-CHEK SOFTCLIX LANCETS MISC
6 refills | Status: AC
  Filled 2024-01-11: qty 100, 33d supply, fill #0

## 2024-01-11 MED ORDER — PEN NEEDLES 32G X 4 MM MISC
2 refills | Status: AC
  Filled 2024-01-11: qty 100, 100d supply, fill #0

## 2024-01-11 MED ORDER — ACCU-CHEK GUIDE W/DEVICE KIT
PACK | 0 refills | Status: AC
  Filled 2024-01-11: qty 1, 30d supply, fill #0

## 2024-01-11 MED ORDER — ACCU-CHEK GUIDE TEST VI STRP
ORAL_STRIP | 6 refills | Status: AC
  Filled 2024-01-11: qty 100, 33d supply, fill #0

## 2024-01-11 MED ORDER — SEMAGLUTIDE (1 MG/DOSE) 4 MG/3ML ~~LOC~~ SOPN
1.0000 mg | PEN_INJECTOR | SUBCUTANEOUS | 1 refills | Status: DC
  Filled 2024-01-11 – 2024-01-13 (×2): qty 3, 28d supply, fill #0

## 2024-01-11 MED ORDER — LANTUS SOLOSTAR 100 UNIT/ML ~~LOC~~ SOPN
26.0000 [IU] | PEN_INJECTOR | Freq: Every day | SUBCUTANEOUS | 2 refills | Status: DC
  Filled 2024-01-11 – 2024-01-13 (×2): qty 15, 57d supply, fill #0

## 2024-01-11 NOTE — Progress Notes (Signed)
    S:     No chief complaint on file.  28 y.o. female who presents for diabetes evaluation, education, and management. PMH is significant for T2DM, obesity, depression. Patient was referred and last seen by Primary Care Provider, Dr. Tanda, on 10/05/23 (A1c 7.3%). I saw her on 12/06/23 and increased Ozempic  to 0.5 mg weekly. I continued her Lantus .   Today, she reports that she's doing well. She endorses adherence to Ozempic  and Lantus . Has taken four 0.5 mg injections. Denies any NV, abdominal pain. Denies any changes in vision.   Family/Social History:  Fhx:  no pertinent positives  Tobacco: never smoker  Alcohol: none reported  Current diabetes medications include: Lantus  30 units daily, Ozempic  0.5 mg weekly Patient reports adherence to taking all medications as prescribed.  Insurance coverage: [employer]  Patient denies hypoglycemic events.  Patient denies nocturia (nighttime urination).  Patient reports rare symptoms of neuropathy (nerve pain). Patient denies visual changes. Patient reports self foot exams.   Patient reported dietary habits:  -Reports decrease in appetite since restarting the Ozempic .   O:   Lab Results  Component Value Date   HGBA1C 8.4 (A) 01/11/2024   There were no vitals filed for this visit.  Lipid Panel     Component Value Date/Time   CHOL 170 12/14/2022 1458   TRIG 116 12/14/2022 1458   HDL 41 12/14/2022 1458   CHOLHDL 4.1 12/14/2022 1458   LDLCALC 108 (H) 12/14/2022 1458    Clinical Atherosclerotic Cardiovascular Disease (ASCVD): No  The ASCVD Risk score (Arnett DK, et al., 2019) failed to calculate for the following reasons:   The 2019 ASCVD risk score is only valid for ages 78 to 13   Patient is participating in a Managed Medicaid Plan:  Yes   A/P: Diabetes longstanding currently above goal given A1c today. Will continue the process of titrating Ozempic . Patient is not currently hypoglycemic but is able to verbalize appropriate  hypoglycemia management plan. Medication adherence appears to be appropriate. -Increased dose of Ozempic  to 1 mg weekly.  -DECREASE Lantus  to 26 units daily.  -Patient educated on purpose, proper use, and potential adverse effects of Ozempic .  -Extensively discussed pathophysiology of diabetes, recommended lifestyle interventions, dietary effects on blood sugar control.  -Counseled on s/sx of and management of hypoglycemia.  -Next A1c anticipated 12/2023.   Written patient instructions provided. Patient verbalized understanding of treatment plan.  Total time in face to face counseling 30 minutes.    Follow-up:  Pharmacist in 1 month.  Herlene Fleeta Morris, PharmD, [name], CPP Clinical Pharmacist [facility] & [facility] [phone]

## 2024-01-14 ENCOUNTER — Other Ambulatory Visit (HOSPITAL_COMMUNITY): Payer: Self-pay

## 2024-01-14 ENCOUNTER — Other Ambulatory Visit: Payer: Self-pay

## 2024-01-15 ENCOUNTER — Other Ambulatory Visit: Payer: Self-pay

## 2024-01-17 ENCOUNTER — Other Ambulatory Visit: Payer: Self-pay

## 2024-01-21 ENCOUNTER — Other Ambulatory Visit: Payer: Self-pay

## 2024-01-22 ENCOUNTER — Other Ambulatory Visit: Payer: Self-pay

## 2024-01-22 ENCOUNTER — Ambulatory Visit: Payer: Self-pay | Admitting: Family Medicine

## 2024-01-28 NOTE — Therapy (Signed)
 " OUTPATIENT PHYSICAL THERAPY FEMALE PELVIC EVALUATION   Patient Name: Melissa Gilmore MRN: [id number] DOB:Jun 17, 1995, 28 y.o., female Today's Date: 01/29/2024  END OF SESSION:  PT End of Session - 01/29/24 1430     Visit Number 1    Date for Recertification  07/29/24    Authorization Type [employer] - waiting for auth    PT Start Time 1400    PT Stop Time 1440    PT Time Calculation (min) 40 min    Activity Tolerance Patient tolerated treatment well;Patient limited by pain    Behavior During Therapy [facility] for tasks assessed/performed          Past Medical History:  Diagnosis Date   Acute cholecystitis with chronic cholecystitis 07/16/2021   Acute lumbar back pain 03/18/2022   Anxiety    Childhood asthma    Depression    Major Depression disorder   Diabetes mellitus without complication [facility])    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HCC)    Dyslipidemia 07/17/2021   Headache    Migraines   History of abnormal uterine bleeding 07/17/2021   Schizophrenia (HCC)    Syncope 03/23/2022   Urticaria    Past Surgical History:  Procedure Laterality Date   CESAREAN SECTION N/A 08/05/2023   Procedure: CESAREAN DELIVERY;  Surgeon: Jayne Vonn DEL, MD;  Location: [facility] LD ORS;  Service: Obstetrics;  Laterality: N/A;   CHOLECYSTECTOMY N/A 07/17/2021   Procedure: SINGLE SITE LAPAROSCOPIC CHOLECYSTECTOMY WITH INTRAOPERATIVE CHOLANGIOGRAM; LIVER BIOPSY;  Surgeon: Sheldon Standing, MD;  Location: [facility] ORS;  Service: General;  Laterality: N/A;   IR EPIDUROGRAPHY  03/21/2022   LUMBAR LAMINECTOMY/ DECOMPRESSION WITH MET-RX Right 06/06/2022   Procedure: Right Lumbar three-four Minimally Invasive Surgery Laminectomy/Discectomy;  Surgeon: Cheryle Debby LABOR, MD;  Location: [facility];  Service: Neurosurgery;  Laterality: Right;   WISDOM TOOTH EXTRACTION     Patient Active Problem List   Diagnosis Date Noted   Status post primary low transverse cesarean section 08/05/2023   Gestational hypertension  08/03/2023   Uterine contractions during pregnancy 08/02/2023   Elevated blood pressure affecting pregnancy in third trimester, antepartum 08/02/2023   Nausea and vomiting during pregnancy 08/02/2023   Diarrhea during pregnancy 08/02/2023   Type 2 diabetes mellitus during pregnancy 08/02/2023   GBS (group B Streptococcus carrier), +RV culture, currently pregnant 07/23/2023   BMI 40.0-44.9, adult (HCC) 04/09/2023   Abnormal antenatal AFP screen 03/25/2023   Pre-existing type 2 diabetes mellitus in pregnancy 03/25/2023   Family history of spina bifida 02/27/2023   Supervision of high risk pregnancy, antepartum 02/22/2023   Lumbar radiculopathy 06/06/2022   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/2021   Schizophrenia (HCC) 07/17/2021   Depression 07/17/2021   Insulin -requiring or dependent type II diabetes mellitus (HCC) 07/17/2021    PCP: Tanda Bleacher, MD  REFERRING PROVIDER: Jeralyn Crutch, MD  REFERRING DIAG: R10.20,G89.29 (ICD-10-CM) - Chronic pelvic pain in female M79.10 (ICD-10-CM) - Myalgia  THERAPY DIAG:  Chronic right-sided low back pain with right-sided sciatica  Difficulty in walking, not elsewhere classified  Muscle weakness (generalized)  Rationale for Evaluation and Treatment: Rehabilitation  ONSET DATE: 3-4 months ago  SUBJECTIVE:        Patient reports that she has had pelvic pain that started 3-4 months ago. Hurts when she sits, when she is standing and holding baby on hip. She is 6 months PP. Still has back pain. Pain can be up to a 10/10, wraps from lumbar area to the front. Intercourse used to be painful early  postpartum.  Had a back injury and did PT during pregnancy. Feels tired.  Had a C section after 4 days of labor                                                                                                                                                                                     SUBJECTIVE STATEMENT: FUNCTIONAL LIMITATIONS:  sitting is hard, carrying baby, cleaning- kneeling, bending forward, cannot get up from the floor  PERTINENT HISTORY:  Medications for current condition: advil  Surgeries: back surgery- patient does not remember exactly April 2024 Other:  Sexual abuse: No  PAIN:  Are you having pain? Yes NPRS scale: up to 10/10 Pain location: External  Pain type: aching Pain description: constant   Aggravating factors: sitting, getting up from sitting, kneeling Relieving factors: advil  sometimes  PRECAUTIONS: None  RED FLAGS: None   WEIGHT BEARING RESTRICTIONS: No  FALLS:  Has patient fallen in last 6 months? No  OCCUPATION: stay at home mom  ACTIVITY LEVEL : active with baby  PLOF: Independent  PATIENT GOALS: to not have back pain and pelvic pain   BOWEL MOVEMENT: constipated sometimes   URINATION: Pain with urination: No Fully empty bladder: Yes:                                           Post-void dribble: No Stream: Strong Urgency: Yes sometimes Frequency:during the day no                                                        Nocturia: No   Leakage: none Pads/briefs: No  INTERCOURSE: no issues   PREGNANCY: Vaginal deliveries 0 Tearing No Episiotomy No C-section deliveries 1 Currently pregnant No  PROLAPSE: None   OBJECTIVE:  Note: Objective measures were completed at Evaluation unless otherwise noted.  DIAGNOSTIC FINDINGS:  Post-void residual: Voiding Cystourethrogram (VCUG):  Ultrasound:   PATIENT SURVEYS:    PFIQ-7: 150 UIQ-7 48 CRAIG -7 33 POPIQ-7 67   COGNITION: Overall cognitive status: Within functional limits for tasks assessed     SENSATION: Light touch: Appears intact  LUMBAR SPECIAL TESTS:  Straight leg raise test: Positive for weakness  FUNCTIONAL TESTS:   Single leg stance:pelvic sway bilat  Rt:  Lt: Sit-up test: has to use arms to lift Squat: Bed mobility: aggravating   GAIT: Assistive device utilized:  None  Comments: antalgic  POSTURE: rounded shoulders and forward head, scoliosis   LUMBARAROM/PROM:  A/PROM A/PROM  Eval (% available)  Flexion 70 irritating  Extension 70  Right lateral flexion 70  Left lateral flexion 70  Right rotation 70  Left rotation 70   (Blank rows = not tested)  LOWER EXTREMITY ROM: full but irritating on right    LOWER EXTREMITY FFU:apojuzmjo hips 4-/5  PALPATION:  General: tight and tender C section scar  Pelvic Alignment: uneven  Abdominal: C section scar  Diastasis: Yes:   Distortion: No  Breathing: upper chest Scar tissue: Yes:   Active Straight Leg Raise: painful on left                 External Perineal Exam: to be assessed                              Internal Pelvic Floor: to be assessed   Patient confirms identification and approves PT to assess internal pelvic floor and treatment Yes All internal or external pelvic floor assessments and/or treatments are completed with proper hand hygiene and gloves hands. If needed gloves are changed with hand hygiene during patient care time.  PELVIC MMT:   MMT eval  Vaginal   Internal Anal Sphincter   External Anal Sphincter   Puborectalis   (Blank rows = not tested)        TONE: to be assessed   PROLAPSE: to be assessed   TODAY'S TREATMENT:                                                                                                                              DATE:  Examination completed, findings reviewed, pt educated on POC, HEP, and female pelvic floor anatomy, reasoning with pelvic floor assessment internally with pt consent. Pt motivated to participate in PT and agreeable to attempt recommendations.       PATIENT EDUCATION:  Education details: Pt was educated on relevant anatomy, exam findings, home exercise program, plan of care, expectations of PT and scar massage  Person educated: Patient Education method: Explanation, Demonstration, Tactile cues, Verbal cues, and  Handouts Education comprehension: verbalized understanding, returned demonstration, verbal cues required, tactile cues required, and needs further education  HOME EXERCISE PROGRAM: Access Code: [email or web address] URL: [email or web address] Date: 01/29/2024 Prepared by: Cori Quintan Saldivar  Patient Education - Bowel Emptying Techniques - High-Fiber Diet to Support Pelvic Health - Bowel Emptying Techniques - Abdominal Massage for Constipation  ASSESSMENT:  CLINICAL IMPRESSION: Patient is a 28 y.o. F who was seen today for physical therapy evaluation and treatment for low back pain postpartum. Patient reports that symptoms have been bothering her for the last 3 months. Patient is 6 months postpartum . Exam findings are notable for upper chest breathing strategies, abdominal weakness, tight and tender C section scar, pelvic floor muscle will be assessed next visit Patient demonstrates decreased  trunk  mobility, bilateral hip weakness possible radicular symptoms r thigh., trigger points in lumbar paraspinals and scoliosis. It is difficult for patient to bend over, sit, kneel and take care of her baby  due to pain . Discussed findings with patient, educated patient on abdominal scar massage and HEP was initiated. Patient's quality of life has been affected, patient is frustrated and will benefit from physical therapy to address deficits, reduce pain and improve mobility, strength and quality of life.  .   OBJECTIVE IMPAIRMENTS: decreased activity tolerance, decreased balance, decreased endurance, decreased mobility, difficulty walking, decreased ROM, decreased strength, hypomobility, increased muscle spasms, obesity, and pain.   ACTIVITY LIMITATIONS: lifting, bending, standing, squatting, stairs, bed mobility, toileting, locomotion level, and caring for others  PARTICIPATION LIMITATIONS: community activity and occupation  PERSONAL FACTORS: Age, Fitness, and Time since onset of  injury/illness/exacerbation are also affecting patient's functional outcome.   REHAB POTENTIAL: Good  CLINICAL DECISION MAKING: Evolving/moderate complexity  EVALUATION COMPLEXITY: Moderate   GOALS: Goals reviewed with patient? Yes  SHORT TERM GOALS: Target date: 02/26/2024    Pt will be independent with HEP.   Baseline: Goal status: INITIAL  2.  Patient will report max 3/10 back/ pelvic pain Baseline:  Goal status: INITIAL  3.  Patient will be I with healthy bowel PT recommendations Baseline:  Goal status: INITIAL  LONG TERM GOALS: Target date: 07/29/2024   Pt will be I and consistent with her HEP and demonstrate all exercises correctly  Baseline:  Goal status: INITIAL  2.  Pt will have reduced low back pain to max 1/10 in order to be able to do functional activities such as bending, lifting, twisting and walking as needed to be able to take care of her family and participate in job duties.  Baseline:  Goal status: INITIAL  3.  Patient will be able to get off floor without help Baseline:  Goal status: INITIAL  4.  Patient will be able to sit as long as she wants without increased pain Baseline:  Goal status: INITIAL  5.  Patient will be able to carry her baby up stairs into her house Baseline:  Goal status: INITIAL  PLAN:  PT FREQUENCY: 1-2x/week  PT DURATION: 6 months  PLANNED INTERVENTIONS: 97110-Therapeutic exercises, 97530- Therapeutic activity, 97112- Neuromuscular re-education, 97535- Self Care, [id number]- Manual therapy, [phone]- Electrical stimulation (manual), [phone]- Ionotophoresis 4mg /ml Dexamethasone , 79439 (1-2 muscles), 20561 (3+ muscles)- Dry Needling, Patient/Family education, Taping, Joint mobilization, Joint manipulation, Spinal manipulation, Spinal mobilization, Scar mobilization, Cryotherapy, Moist heat, and Biofeedback  PLAN FOR NEXT SESSION: internal pelvic floor assessment, postpartum strengthening, treat low back pain, work on endurance, hip  and core strengthening   Deloise Marchant, PT 01/29/2024, 3:00 PM  "

## 2024-01-29 ENCOUNTER — Other Ambulatory Visit: Payer: Self-pay

## 2024-01-29 ENCOUNTER — Encounter: Payer: Self-pay | Admitting: Physical Therapy

## 2024-01-29 ENCOUNTER — Ambulatory Visit: Payer: MEDICAID | Admitting: Physical Therapy

## 2024-01-29 DIAGNOSIS — R102 Pelvic and perineal pain unspecified side: Secondary | ICD-10-CM | POA: Diagnosis not present

## 2024-01-29 DIAGNOSIS — R262 Difficulty in walking, not elsewhere classified: Secondary | ICD-10-CM | POA: Diagnosis present

## 2024-01-29 DIAGNOSIS — M6281 Muscle weakness (generalized): Secondary | ICD-10-CM | POA: Diagnosis present

## 2024-01-29 DIAGNOSIS — M791 Myalgia, unspecified site: Secondary | ICD-10-CM | POA: Diagnosis not present

## 2024-01-29 DIAGNOSIS — G8929 Other chronic pain: Secondary | ICD-10-CM | POA: Diagnosis present

## 2024-01-29 DIAGNOSIS — M5441 Lumbago with sciatica, right side: Secondary | ICD-10-CM | POA: Diagnosis present

## 2024-02-04 ENCOUNTER — Other Ambulatory Visit: Payer: Self-pay

## 2024-02-04 ENCOUNTER — Ambulatory Visit: Payer: Self-pay

## 2024-02-04 ENCOUNTER — Inpatient Hospital Stay (HOSPITAL_COMMUNITY)
Admission: EM | Admit: 2024-02-04 | Discharge: 2024-02-07 | DRG: 872 | Disposition: A | Discharge status: Home / Self Care | Payer: MEDICAID | Attending: Internal Medicine | Admitting: Internal Medicine

## 2024-02-04 ENCOUNTER — Emergency Department (HOSPITAL_COMMUNITY): Payer: MEDICAID

## 2024-02-04 ENCOUNTER — Encounter (HOSPITAL_COMMUNITY): Payer: Self-pay | Admitting: Emergency Medicine

## 2024-02-04 DIAGNOSIS — R6520 Severe sepsis without septic shock: Secondary | ICD-10-CM | POA: Diagnosis present

## 2024-02-04 DIAGNOSIS — Z833 Family history of diabetes mellitus: Secondary | ICD-10-CM

## 2024-02-04 DIAGNOSIS — Z6841 Body Mass Index (BMI) 40.0 and over, adult: Secondary | ICD-10-CM

## 2024-02-04 DIAGNOSIS — R202 Paresthesia of skin: Secondary | ICD-10-CM | POA: Diagnosis present

## 2024-02-04 DIAGNOSIS — E1165 Type 2 diabetes mellitus with hyperglycemia: Secondary | ICD-10-CM | POA: Diagnosis present

## 2024-02-04 DIAGNOSIS — A419 Sepsis, unspecified organism: Principal | ICD-10-CM | POA: Diagnosis present

## 2024-02-04 DIAGNOSIS — N12 Tubulo-interstitial nephritis, not specified as acute or chronic: Secondary | ICD-10-CM | POA: Diagnosis present

## 2024-02-04 DIAGNOSIS — E860 Dehydration: Secondary | ICD-10-CM | POA: Diagnosis present

## 2024-02-04 DIAGNOSIS — F209 Schizophrenia, unspecified: Secondary | ICD-10-CM | POA: Diagnosis present

## 2024-02-04 DIAGNOSIS — Z794 Long term (current) use of insulin: Secondary | ICD-10-CM

## 2024-02-04 DIAGNOSIS — R1031 Right lower quadrant pain: Secondary | ICD-10-CM | POA: Diagnosis present

## 2024-02-04 DIAGNOSIS — E872 Acidosis, unspecified: Secondary | ICD-10-CM | POA: Diagnosis present

## 2024-02-04 DIAGNOSIS — F4481 Dissociative identity disorder: Secondary | ICD-10-CM | POA: Diagnosis present

## 2024-02-04 DIAGNOSIS — E118 Type 2 diabetes mellitus with unspecified complications: Secondary | ICD-10-CM

## 2024-02-04 DIAGNOSIS — D649 Anemia, unspecified: Secondary | ICD-10-CM | POA: Diagnosis not present

## 2024-02-04 DIAGNOSIS — M5136 Other intervertebral disc degeneration, lumbar region with discogenic back pain only: Secondary | ICD-10-CM | POA: Diagnosis present

## 2024-02-04 LAB — CBC WITH DIFFERENTIAL/PLATELET
Abs Immature Granulocytes: 0.05 K/uL (ref 0.00–0.07)
Basophils Absolute: 0 K/uL (ref 0.0–0.1)
Basophils Relative: 0 %
Eosinophils Absolute: 0 K/uL (ref 0.0–0.5)
Eosinophils Relative: 0 %
HCT: 41.3 % (ref 36.0–46.0)
Hemoglobin: 13.3 g/dL (ref 12.0–15.0)
Immature Granulocytes: 0 %
Lymphocytes Relative: 9 %
Lymphs Abs: 1.1 K/uL (ref 0.7–4.0)
MCH: 26.7 pg (ref 26.0–34.0)
MCHC: 32.2 g/dL (ref 30.0–36.0)
MCV: 82.9 fL (ref 80.0–100.0)
Monocytes Absolute: 0.7 K/uL (ref 0.1–1.0)
Monocytes Relative: 5 %
Neutro Abs: 11.5 K/uL — ABNORMAL HIGH (ref 1.7–7.7)
Neutrophils Relative %: 86 %
Platelets: 241 K/uL (ref 150–400)
RBC: 4.98 MIL/uL (ref 3.87–5.11)
RDW: 14.3 % (ref 11.5–15.5)
WBC: 13.4 K/uL — ABNORMAL HIGH (ref 4.0–10.5)
nRBC: 0 % (ref 0.0–0.2)

## 2024-02-04 LAB — COMPREHENSIVE METABOLIC PANEL WITH GFR
ALT: 69 U/L — ABNORMAL HIGH (ref 0–44)
AST: 43 U/L — ABNORMAL HIGH (ref 15–41)
Albumin: 4.3 g/dL (ref 3.5–5.0)
Alkaline Phosphatase: 275 U/L — ABNORMAL HIGH (ref 38–126)
Anion gap: 14 (ref 5–15)
BUN: <5 mg/dL — ABNORMAL LOW (ref 6–20)
CO2: 20 mmol/L — ABNORMAL LOW (ref 22–32)
Calcium: 9.3 mg/dL (ref 8.9–10.3)
Chloride: 102 mmol/L (ref 98–111)
Creatinine, Ser: 0.93 mg/dL (ref 0.44–1.00)
GFR, Estimated: >60 mL/min
Glucose, Bld: 295 mg/dL — ABNORMAL HIGH (ref 70–99)
Potassium: 4 mmol/L (ref 3.5–5.1)
Sodium: 136 mmol/L (ref 135–145)
Total Bilirubin: 1.1 mg/dL (ref 0.0–1.2)
Total Protein: 7.9 g/dL (ref 6.5–8.1)

## 2024-02-04 LAB — I-STAT CG4 LACTIC ACID, ED: Lactic Acid, Venous: 2.7 mmol/L (ref 0.5–1.9)

## 2024-02-04 LAB — CBG MONITORING, ED: Glucose-Capillary: 223 mg/dL — ABNORMAL HIGH (ref 70–99)

## 2024-02-04 LAB — HCG, SERUM, QUALITATIVE: Preg, Serum: NEGATIVE

## 2024-02-04 LAB — LIPASE, BLOOD: Lipase: 12 U/L (ref 11–51)

## 2024-02-04 MED ORDER — IOHEXOL 350 MG/ML SOLN
50.0000 mL | Freq: Once | INTRAVENOUS | Status: AC | PRN
  Administered 2024-02-04: 50 mL via INTRAVENOUS

## 2024-02-04 MED ORDER — ONDANSETRON HCL 4 MG/2ML IJ SOLN
4.0000 mg | Freq: Once | INTRAMUSCULAR | Status: AC
  Administered 2024-02-04: 4 mg via INTRAVENOUS
  Filled 2024-02-04: qty 2

## 2024-02-04 MED ORDER — SODIUM CHLORIDE 0.9 % IV SOLN: INTRAVENOUS | Status: DC

## 2024-02-04 MED ORDER — SODIUM CHLORIDE 0.9 % IV BOLUS
1000.0000 mL | Freq: Once | INTRAVENOUS | Status: AC
  Administered 2024-02-04: 1000 mL via INTRAVENOUS

## 2024-02-04 MED ORDER — MORPHINE SULFATE (PF) 2 MG/ML IV SOLN
4.0000 mg | Freq: Once | INTRAVENOUS | Status: AC
  Administered 2024-02-04: 4 mg via INTRAVENOUS
  Filled 2024-02-04: qty 2

## 2024-02-04 NOTE — Telephone Encounter (Signed)
 FYI Only or Action Required?: Action required by provider: update on patient condition.  Patient was last seen in primary care on 10/05/2023 by Tanda Bleacher, MD.  Called Nurse Triage reporting Chills, Back Pain, Pelvic Pain, and Nausea.  Symptoms began yesterday.  Interventions attempted: OTC medications: advil .  Symptoms are: gradually worsening.  Triage Disposition: Go to ED Now (Notify PCP)  Patient/caregiver understands and will follow disposition?: Yes   Copied from CRM [id number]. Topic: Clinical - Red Word Triage >> Feb 04, 2024 12:13 PM Wess RAMAN wrote: Red Word that prompted transfer to Nurse Triage: Shaking, cold, lots of pain in lower back and pelvic area. Reason for Disposition  [1] SEVERE pelvic pain AND [2] present > 1 hour  Answer Assessment - Initial Assessment Questions 1. LOCATION: Where does it hurt?      Right sided lower back and pelvic pain   3. ONSET: When did the pain begin? (e.g., minutes, hours or days ago)      Yesterday  4. SUDDEN: Gradual or sudden onset?     Sudden  5. PATTERN Does the pain come and go, or is it constant?     Comes and goes, takes Advil  for the pain which helps  6. SEVERITY: How bad is the pain?  (e.g., Scale 1-10; mild, moderate, or severe)     8/10 sharp pain with nausea, right sided pelvic pain  7. RECURRENT SYMPTOM: Have you ever had this type of pelvic pain before? If Yes, ask: When was the last time? and What happened that time?      Yes just not as bad, not in a long time  9. RELIEVING/AGGRAVATING FACTORS: What makes it better or worse? (e.g., activity/rest, sexual intercourse, voiding, passing stool)     Advil    10. OTHER SYMPTOMS: Has there been any other symptoms? (e.g., fever, constipation, diarrhea, urine problems, vaginal bleeding, vaginal discharge, or vomiting? Pelvic pain, chills with teeth-chattering History of pelvic floor issues, seeing pelvic floor therapist    11.  PREGNANCY: Is there any chance you are pregnant? When was your last menstrual period?       Negative pregnancy test 1 week ago On Depo, hasn't had a period since  Protocols used: Pelvic Pain - Female-A-AH

## 2024-02-04 NOTE — ED Provider Triage Note (Signed)
 Emergency Medicine Provider Triage Evaluation Note  Shelsea Hangartner [facility] , a 28 y.o. female  was evaluated in triage.  Pt complains of abd pain. RLQ abd pain with nausea and vomiting with chills since yesterday. Also endorse fever.  No dysuria, vaginal bleeding or vaginal discharge.   Review of Systems  Positive: As above Negative: As above  Physical Exam  BP (!) 144/97   Pulse (!) 143   Temp 98.5 F (36.9 C)   Resp 20   SpO2 100%  Gen:   Awake, ill appearing Resp:  Normal effort  MSK:   Moves extremities without difficulty  Other:  RLQ tenderness  Medical Decision Making  Medically screening exam initiated at 7:12 PM.  Appropriate orders placed.  Char Feltman [facility] was informed that the remainder of the evaluation will be completed by another provider, this initial triage assessment does not replace that evaluation, and the importance of remaining in the ED until their evaluation is complete.  ?appy   Nivia Colon, PA-C 02/04/24 1913

## 2024-02-04 NOTE — ED Triage Notes (Signed)
 Patient states that's she is having pelvic pain, lower back pain, and she is dizzy. She states the symptoms started yesterday after. She is not having and vaginal bleeding and she does not have a period she is on the depo shot.  She is having nausea, vomiting and diarrhea. Denies fever.

## 2024-02-04 NOTE — Telephone Encounter (Signed)
 Patient was informed to reach out to OBGYN regarding the pelvic pain, I also informed her to go to Urgent care to get evaluated for the chills and body pain.

## 2024-02-05 ENCOUNTER — Emergency Department (HOSPITAL_COMMUNITY): Payer: MEDICAID

## 2024-02-05 DIAGNOSIS — E8729 Other acidosis: Secondary | ICD-10-CM

## 2024-02-05 DIAGNOSIS — R112 Nausea with vomiting, unspecified: Secondary | ICD-10-CM | POA: Diagnosis not present

## 2024-02-05 DIAGNOSIS — E1165 Type 2 diabetes mellitus with hyperglycemia: Secondary | ICD-10-CM | POA: Diagnosis present

## 2024-02-05 DIAGNOSIS — F4481 Dissociative identity disorder: Secondary | ICD-10-CM | POA: Diagnosis present

## 2024-02-05 DIAGNOSIS — E872 Acidosis, unspecified: Secondary | ICD-10-CM | POA: Diagnosis present

## 2024-02-05 DIAGNOSIS — D649 Anemia, unspecified: Secondary | ICD-10-CM | POA: Diagnosis not present

## 2024-02-05 DIAGNOSIS — N12 Tubulo-interstitial nephritis, not specified as acute or chronic: Secondary | ICD-10-CM | POA: Diagnosis present

## 2024-02-05 DIAGNOSIS — R7401 Elevation of levels of liver transaminase levels: Secondary | ICD-10-CM

## 2024-02-05 DIAGNOSIS — E860 Dehydration: Secondary | ICD-10-CM

## 2024-02-05 DIAGNOSIS — R202 Paresthesia of skin: Secondary | ICD-10-CM | POA: Diagnosis present

## 2024-02-05 DIAGNOSIS — Z6841 Body Mass Index (BMI) 40.0 and over, adult: Secondary | ICD-10-CM | POA: Diagnosis not present

## 2024-02-05 DIAGNOSIS — R197 Diarrhea, unspecified: Secondary | ICD-10-CM

## 2024-02-05 DIAGNOSIS — E118 Type 2 diabetes mellitus with unspecified complications: Secondary | ICD-10-CM | POA: Diagnosis not present

## 2024-02-05 DIAGNOSIS — A419 Sepsis, unspecified organism: Secondary | ICD-10-CM | POA: Diagnosis present

## 2024-02-05 DIAGNOSIS — R6520 Severe sepsis without septic shock: Secondary | ICD-10-CM | POA: Diagnosis present

## 2024-02-05 DIAGNOSIS — Z833 Family history of diabetes mellitus: Secondary | ICD-10-CM | POA: Diagnosis not present

## 2024-02-05 DIAGNOSIS — R1031 Right lower quadrant pain: Secondary | ICD-10-CM | POA: Diagnosis present

## 2024-02-05 DIAGNOSIS — F209 Schizophrenia, unspecified: Secondary | ICD-10-CM | POA: Diagnosis present

## 2024-02-05 DIAGNOSIS — N10 Acute tubulo-interstitial nephritis: Secondary | ICD-10-CM | POA: Diagnosis not present

## 2024-02-05 DIAGNOSIS — M5136 Other intervertebral disc degeneration, lumbar region with discogenic back pain only: Secondary | ICD-10-CM | POA: Diagnosis present

## 2024-02-05 DIAGNOSIS — Z794 Long term (current) use of insulin: Secondary | ICD-10-CM | POA: Diagnosis not present

## 2024-02-05 LAB — GLUCOSE, CAPILLARY
Glucose-Capillary: 243 mg/dL — ABNORMAL HIGH (ref 70–99)
Glucose-Capillary: 242 mg/dL — ABNORMAL HIGH (ref 70–99)
Glucose-Capillary: 230 mg/dL — ABNORMAL HIGH (ref 70–99)
Glucose-Capillary: 272 mg/dL — ABNORMAL HIGH (ref 70–99)
Glucose-Capillary: 228 mg/dL — ABNORMAL HIGH (ref 70–99)

## 2024-02-05 LAB — CBC
HCT: 31.8 % — ABNORMAL LOW (ref 36.0–46.0)
Hemoglobin: 10.4 g/dL — ABNORMAL LOW (ref 12.0–15.0)
MCH: 26.8 pg (ref 26.0–34.0)
MCHC: 32.7 g/dL (ref 30.0–36.0)
MCV: 82 fL (ref 80.0–100.0)
Platelets: 167 K/uL (ref 150–400)
RBC: 3.88 MIL/uL (ref 3.87–5.11)
RDW: 14.4 % (ref 11.5–15.5)
WBC: 12.2 K/uL — ABNORMAL HIGH (ref 4.0–10.5)
nRBC: 0 % (ref 0.0–0.2)

## 2024-02-05 LAB — URINALYSIS, ROUTINE W REFLEX MICROSCOPIC
Bilirubin Urine: NEGATIVE
Glucose, UA: >=500 mg/dL — AB
Ketones, ur: 20 mg/dL — AB
Nitrite: POSITIVE — AB
Protein, ur: 100 mg/dL — AB
Specific Gravity, Urine: 1.04 — ABNORMAL HIGH (ref 1.005–1.030)
WBC, UA: >50 WBC/hpf (ref 0–5)
pH: 5 (ref 5.0–8.0)

## 2024-02-05 LAB — MRSA NEXT GEN BY PCR, NASAL: MRSA by PCR Next Gen: NOT DETECTED

## 2024-02-05 LAB — URINALYSIS, W/ REFLEX TO CULTURE (INFECTION SUSPECTED)
Bilirubin Urine: NEGATIVE
Glucose, UA: >=500 mg/dL — AB
Ketones, ur: 5 mg/dL — AB
Nitrite: NEGATIVE
Protein, ur: NEGATIVE mg/dL
Specific Gravity, Urine: 1.012 (ref 1.005–1.030)
WBC, UA: >50 WBC/hpf (ref 0–5)
pH: 6 (ref 5.0–8.0)

## 2024-02-05 LAB — GAMMA GT: GGT: 287 U/L — ABNORMAL HIGH (ref 7–50)

## 2024-02-05 LAB — BASIC METABOLIC PANEL WITH GFR
Anion gap: 10 (ref 5–15)
BUN: <5 mg/dL — ABNORMAL LOW (ref 6–20)
CO2: 20 mmol/L — ABNORMAL LOW (ref 22–32)
Calcium: 8 mg/dL — ABNORMAL LOW (ref 8.9–10.3)
Chloride: 105 mmol/L (ref 98–111)
Creatinine, Ser: 0.78 mg/dL (ref 0.44–1.00)
GFR, Estimated: >60 mL/min
Glucose, Bld: 257 mg/dL — ABNORMAL HIGH (ref 70–99)
Potassium: 3.7 mmol/L (ref 3.5–5.1)
Sodium: 135 mmol/L (ref 135–145)

## 2024-02-05 LAB — LACTIC ACID, PLASMA: Lactic Acid, Venous: 1.9 mmol/L (ref 0.5–1.9)

## 2024-02-05 LAB — I-STAT CG4 LACTIC ACID, ED
Lactic Acid, Venous: 2.9 mmol/L (ref 0.5–1.9)
Lactic Acid, Venous: 5.3 mmol/L (ref 0.5–1.9)
Lactic Acid, Venous: 1.6 mmol/L (ref 0.5–1.9)

## 2024-02-05 LAB — MAGNESIUM
Magnesium: 1.1 mg/dL — ABNORMAL LOW (ref 1.7–2.4)
Magnesium: 1.9 mg/dL (ref 1.7–2.4)

## 2024-02-05 LAB — CREATININE, SERUM
Creatinine, Ser: 0.83 mg/dL (ref 0.44–1.00)
GFR, Estimated: >60 mL/min

## 2024-02-05 MED ORDER — DOCUSATE SODIUM 100 MG PO CAPS: 100.0000 mg | ORAL_CAPSULE | Freq: Two times a day (BID) | ORAL | Status: DC | PRN

## 2024-02-05 MED ORDER — ORAL CARE MOUTH RINSE: 15.0000 mL | OROMUCOSAL | Status: DC | PRN

## 2024-02-05 MED ORDER — CHLORHEXIDINE GLUCONATE CLOTH 2 % EX PADS
6.0000 | MEDICATED_PAD | Freq: Every day | CUTANEOUS | Status: DC
  Administered 2024-02-05 – 2024-02-06 (×2): 6 via TOPICAL

## 2024-02-05 MED ORDER — ACETAMINOPHEN 500 MG PO TABS
1000.0000 mg | ORAL_TABLET | Freq: Four times a day (QID) | ORAL | Status: DC | PRN
  Administered 2024-02-05 – 2024-02-07 (×5): 1000 mg via ORAL
  Filled 2024-02-05 (×5): qty 2

## 2024-02-05 MED ORDER — MORPHINE SULFATE (PF) 4 MG/ML IV SOLN
4.0000 mg | Freq: Once | INTRAVENOUS | Status: AC
  Administered 2024-02-05: 4 mg via INTRAVENOUS
  Filled 2024-02-05: qty 1

## 2024-02-05 MED ORDER — LACTATED RINGERS IV SOLN: INTRAVENOUS | Status: DC

## 2024-02-05 MED ORDER — INSULIN GLARGINE 100 UNIT/ML ~~LOC~~ SOLN
15.0000 [IU] | Freq: Every day | SUBCUTANEOUS | Status: DC
  Administered 2024-02-05 – 2024-02-07 (×3): 15 [IU] via SUBCUTANEOUS
  Filled 2024-02-05 (×4): qty 0.15

## 2024-02-05 MED ORDER — ACETAMINOPHEN 500 MG PO TABS
1000.0000 mg | ORAL_TABLET | Freq: Once | ORAL | Status: AC
  Administered 2024-02-05: 1000 mg via ORAL
  Filled 2024-02-05: qty 2

## 2024-02-05 MED ORDER — LACTATED RINGERS IV BOLUS (SEPSIS)
1000.0000 mL | Freq: Once | INTRAVENOUS | Status: AC
  Administered 2024-02-05: 1000 mL via INTRAVENOUS

## 2024-02-05 MED ORDER — METRONIDAZOLE 500 MG/100ML IV SOLN
500.0000 mg | Freq: Once | INTRAVENOUS | Status: AC
  Administered 2024-02-05: 500 mg via INTRAVENOUS
  Filled 2024-02-05: qty 100

## 2024-02-05 MED ORDER — ENOXAPARIN SODIUM 40 MG/0.4ML IJ SOSY
40.0000 mg | PREFILLED_SYRINGE | INTRAMUSCULAR | Status: DC
  Administered 2024-02-05 – 2024-02-07 (×3): 40 mg via SUBCUTANEOUS
  Filled 2024-02-05 (×3): qty 0.4

## 2024-02-05 MED ORDER — POLYETHYLENE GLYCOL 3350 17 G PO PACK: 17.0000 g | PACK | Freq: Every day | ORAL | Status: DC | PRN

## 2024-02-05 MED ORDER — MAGNESIUM SULFATE 2 GM/50ML IV SOLN
2.0000 g | Freq: Once | INTRAVENOUS | Status: AC
  Administered 2024-02-05: 2 g via INTRAVENOUS
  Filled 2024-02-05: qty 50

## 2024-02-05 MED ORDER — ONDANSETRON HCL 4 MG/2ML IJ SOLN
4.0000 mg | Freq: Four times a day (QID) | INTRAMUSCULAR | Status: DC | PRN
  Administered 2024-02-06 – 2024-02-07 (×4): 4 mg via INTRAVENOUS
  Filled 2024-02-05 (×4): qty 2

## 2024-02-05 MED ORDER — INSULIN ASPART 100 UNIT/ML IJ SOLN
0.0000 [IU] | Freq: Three times a day (TID) | INTRAMUSCULAR | Status: DC
  Administered 2024-02-05: 5 [IU] via SUBCUTANEOUS
  Administered 2024-02-05: 8 [IU] via SUBCUTANEOUS
  Administered 2024-02-05: 5 [IU] via SUBCUTANEOUS
  Administered 2024-02-06: 3 [IU] via SUBCUTANEOUS
  Administered 2024-02-06: 2 [IU] via SUBCUTANEOUS
  Administered 2024-02-06: 5 [IU] via SUBCUTANEOUS
  Administered 2024-02-07: 3 [IU] via SUBCUTANEOUS
  Filled 2024-02-05 (×2): qty 5
  Filled 2024-02-05: qty 3
  Filled 2024-02-05: qty 5
  Filled 2024-02-05: qty 8
  Filled 2024-02-05: qty 2
  Filled 2024-02-05: qty 5

## 2024-02-05 MED ORDER — LACTATED RINGERS IV BOLUS (SEPSIS)
500.0000 mL | Freq: Once | INTRAVENOUS | Status: AC
  Administered 2024-02-05: 500 mL via INTRAVENOUS

## 2024-02-05 MED ORDER — SODIUM CHLORIDE 0.9 % IV SOLN
2.0000 g | INTRAVENOUS | Status: DC
  Administered 2024-02-05 – 2024-02-06 (×2): 2 g via INTRAVENOUS
  Filled 2024-02-05 (×2): qty 20

## 2024-02-05 MED ORDER — SODIUM CHLORIDE 0.9 % IV SOLN: INTRAVENOUS | Status: AC

## 2024-02-05 MED ORDER — SODIUM CHLORIDE 0.9 % IV SOLN
2.0000 g | Freq: Once | INTRAVENOUS | Status: AC
  Administered 2024-02-05: 2 g via INTRAVENOUS
  Filled 2024-02-05: qty 20

## 2024-02-05 NOTE — H&P (Signed)
 "  NAME:  Melissa Gilmore, MRN:  [id number], DOB:  [birth year], LOS: 0 ADMISSION DATE:  02/04/2024, CONSULTATION DATE:  02/05/24 REFERRING MD:  EDP, CHIEF COMPLAINT:  pelvis pain and dizziness   History of Present Illness:  28 yo female presented to ED 12/29 afternoon with pelvic pain, lower back pain and dizziness. Pt states that the symptoms began yesterday 12/28. Nothing improves the pain and nothing excerbates it. She states she is not on her menses as she is on depo shot and does not have vaginal bleeding. No dysuria/increased frequency or decreased amounts. Denies vaginal discharge. + n/v/d Pt denies fever but endorses chills. Throughout this she has felt dizzy and generally weak as well.   Upon presentation pt was being appropriately resuscitated and started on empiric abx. However despite these efforts pt's HR continued to increase to 150's, temp increase to 102.5 and lactate from ~2.5 to >5. Imaging revealed pyelonephritis, UA positive.   Additionally, while pt endorses improvement since presentation she states she now is having LLE paresthesia, stating that she feels as though she cannot bare weight on the leg because it feels asleep however she has 5/5 strength and full mobility in leg. No other neuro symptoms or complaints.   Pertinent  Medical History  T2dm Schizophrenia Dissociative identity disorder  Significant Hospital Events: Including procedures, antibiotic start and stop dates in addition to other pertinent events   Admitted to ICU 12/30  Interim History / Subjective:    Objective    Blood pressure (!) 83/61, pulse (!) 142, temperature (!) 102.5 F (39.2 C), temperature source Oral, resp. rate 19, SpO2 100%, not currently breastfeeding.       No intake or output data in the 24 hours ending 02/05/24 0457 There were no vitals filed for this visit.  Examination: General: appears ill, diaphoretic but in no acute distress HENT: ncat, eomi, perrla, mm dry  and pale Lungs: ctab Cardiovascular: tachycardic no m/g/r Abdomen: obese, nt, nd bs+ Extremities: no c/c/e Neuro: no focal deficits GU: deferred  Resolved problem list   Assessment and Plan  Sepsis, severe 2/2 uti Pyelonephritis Fever N/v Diarrhea dehydration Lactic acidosis Elevated alk phos transaminitis L leg paresthesia T2dm with hyperglycemia -cont with volume resuscitation -ceftriaxone  while monitoring cultures -blood culture pending -supportive care with tylenol , zofran  -trend lactate, which is currently increasing despite fluids -check ggt -ssi -clear liquid diet for now -check mag -consider lumbar imaging with LLE parethesia, full motility and strength     Labs   CBC: Recent Labs  Lab 02/04/24 1922  WBC 13.4*  NEUTROABS 11.5*  HGB 13.3  HCT 41.3  MCV 82.9  PLT 241    Basic Metabolic Panel: Recent Labs  Lab 02/04/24 1922  NA 136  K 4.0  CL 102  CO2 20*  GLUCOSE 295*  BUN <5*  CREATININE 0.93  CALCIUM  9.3   GFR: CrCl cannot be calculated (Unknown ideal weight.). Recent Labs  Lab 02/04/24 1922 02/04/24 1930 02/05/24 0105 02/05/24 0408  WBC 13.4*  --   --   --   LATICACIDVEN  --  2.7* 2.9* 5.3*    Liver Function Tests: Recent Labs  Lab 02/04/24 1922  AST 43*  ALT 69*  ALKPHOS 275*  BILITOT 1.1  PROT 7.9  ALBUMIN 4.3   Recent Labs  Lab 02/04/24 1922  LIPASE 12   No results for input(s): AMMONIA in the last 168 hours.  ABG    Component Value Date/Time   TCO2 24 08/31/2023  9781     Coagulation Profile: No results for input(s): INR, PROTIME in the last 168 hours.  Cardiac Enzymes: No results for input(s): CKTOTAL, CKMB, CKMBINDEX, TROPONINI in the last 168 hours.  HbA1C: Hemoglobin A1C  Date/Time Value Ref Range Status  10/05/2023 09:33 AM 7.3 (A) 4.0 - 5.6 % Final   HbA1c, POC (controlled diabetic range)  Date/Time Value Ref Range Status  01/11/2024 02:42 PM 8.4 (A) 0.0 - 7.0 % Final   08/06/2020 11:18 AM 8.9 (A) 0.0 - 7.0 % Final   Hgb A1c MFr Bld  Date/Time Value Ref Range Status  05/22/2023 10:40 AM 5.7 (H) 4.8 - 5.6 % Final    Comment:             Prediabetes: 5.7 - 6.4          Diabetes: >6.4          Glycemic control for adults with diabetes: <7.0   02/27/2023 11:33 AM 6.4 (H) 4.8 - 5.6 % Final    Comment:             Prediabetes: 5.7 - 6.4          Diabetes: >6.4          Glycemic control for adults with diabetes: <7.0     CBG: Recent Labs  Lab 02/04/24 1823  GLUCAP 223*    Review of Systems:   As per HPI  Past Medical History:  She,  has a past medical history of Acute cholecystitis with chronic cholecystitis (07/16/2021), Acute lumbar back pain (03/18/2022), Anxiety, Childhood asthma, Depression, Diabetes mellitus without complication (HCC), 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HCC), Dyslipidemia (07/17/2021), Headache, History of abnormal uterine bleeding (07/17/2021), Schizophrenia (HCC), Syncope (03/23/2022), and Urticaria.   Surgical History:   Past Surgical History:  Procedure Laterality Date   CESAREAN SECTION N/A 08/05/2023   Procedure: CESAREAN DELIVERY;  Surgeon: Jayne Vonn DEL, MD;  Location: [facility] ORS;  Service: Obstetrics;  Laterality: N/A;   CHOLECYSTECTOMY N/A 07/17/2021   Procedure: SINGLE SITE LAPAROSCOPIC CHOLECYSTECTOMY WITH INTRAOPERATIVE CHOLANGIOGRAM; LIVER BIOPSY;  Surgeon: Sheldon Standing, MD;  Location: [facility] ORS;  Service: General;  Laterality: N/A;   IR EPIDUROGRAPHY  03/21/2022   LUMBAR LAMINECTOMY/ DECOMPRESSION WITH MET-RX Right 06/06/2022   Procedure: Right Lumbar three-four Minimally Invasive Surgery Laminectomy/Discectomy;  Surgeon: Cheryle Debby LABOR, MD;  Location: [facility] OR;  Service: Neurosurgery;  Laterality: Right;   WISDOM TOOTH EXTRACTION       Social History:   reports that she has never smoked. She has never been exposed to tobacco smoke. She has never used smokeless tobacco. She reports that she does not  currently use alcohol. She reports that she does not use drugs.   Family History:  Her family history includes Allergic rhinitis in her father; Diabetes in her maternal grandmother; Stomach cancer in her maternal grandmother.   Allergies Allergies[1]   Home Medications  Prior to Admission medications  Medication Sig Start Date End Date Taking? Authorizing Provider  Accu-Chek Softclix Lancets lancets Use to check blood sugar 3 times daily. 01/11/24   Newlin, Enobong, MD  Blood Glucose Monitoring Suppl (ACCU-CHEK GUIDE) w/Device KIT Use to check blood sugar 3 times daily. 01/11/24   Newlin, Enobong, MD  cetirizine  (ZYRTEC  ALLERGY ) 10 MG tablet Take 1 tablet (10 mg total) by mouth daily. 10/02/23   Christopher Savannah, PA-C  Continuous Glucose Sensor (FREESTYLE LIBRE 3 PLUS SENSOR) MISC Change sensor every 15 days. Use to check blood sugar continuously. 12/06/23  Newlin, Enobong, MD  cyclobenzaprine  (FLEXERIL ) 10 MG tablet Take 1 tablet (10 mg total) by mouth 2 (two) times daily as needed for muscle spasms. 12/06/23   Ajewole, Christana, MD  glucose blood (ACCU-CHEK GUIDE TEST) test strip Use to check blood sugar 3 times daily. 01/11/24   Newlin, Enobong, MD  insulin  glargine (LANTUS  SOLOSTAR) 100 UNIT/ML Solostar Pen Inject 26 Units into the skin at bedtime. 01/11/24   Newlin, Enobong, MD  Insulin  Pen Needle (PEN NEEDLES) 32G X 4 MM MISC Use to inject insulin  once daily. 01/11/24   Newlin, Enobong, MD  nitrofurantoin , macrocrystal-monohydrate, (MACROBID ) 100 MG capsule Take 1 capsule (100 mg total) by mouth 2 (two) times daily. 12/10/23   Ajewole, Christana, MD  Semaglutide , 1 MG/DOSE, 4 MG/3ML SOPN Inject 1 mg as directed once a week. 01/11/24   Newlin, Enobong, MD     Critical care time:               [1] No Known Allergies  "

## 2024-02-05 NOTE — Progress Notes (Signed)
 Elink monitoring for the code sepsis protocol.

## 2024-02-05 NOTE — ED Provider Notes (Addendum)
 " [place] [facility] Provider Note   CSN: [id number] Arrival date & time: 02/04/24  1412     Patient presents with: No chief complaint on file.   Melissa Gilmore is a 28 y.o. female.  Patient with past medical history significant for type II DM, schizophrenia, disassociative identity disorder presents to the emergency department complaining of right lower quadrant abdominal pain with bilateral low back pain.  Symptoms began suddenly yesterday morning at approximately 9 AM.  She has had chills.  She denies any known fever at home.  She also endorses lightheadedness with nausea, vomiting, and diarrhea.  She denies any known urinary symptoms or vaginal discharge.  She states she took her temperature at home at approximate 11 AM and it was 99 degrees.  She took an Advil  afterwards for pain.   HPI     Prior to Admission medications  Medication Sig Start Date End Date Taking? Authorizing Provider  Accu-Chek Softclix Lancets lancets Use to check blood sugar 3 times daily. 01/11/24   Newlin, Enobong, MD  Blood Glucose Monitoring Suppl (ACCU-CHEK GUIDE) w/Device KIT Use to check blood sugar 3 times daily. 01/11/24   Newlin, Enobong, MD  cetirizine  (ZYRTEC  ALLERGY ) 10 MG tablet Take 1 tablet (10 mg total) by mouth daily. 10/02/23   Christopher Savannah, PA-C  Continuous Glucose Sensor (FREESTYLE LIBRE 3 PLUS SENSOR) MISC Change sensor every 15 days. Use to check blood sugar continuously. 12/06/23   Newlin, Enobong, MD  cyclobenzaprine  (FLEXERIL ) 10 MG tablet Take 1 tablet (10 mg total) by mouth 2 (two) times daily as needed for muscle spasms. 12/06/23   Ajewole, Christana, MD  glucose blood (ACCU-CHEK GUIDE TEST) test strip Use to check blood sugar 3 times daily. 01/11/24   Newlin, Enobong, MD  insulin  glargine (LANTUS  SOLOSTAR) 100 UNIT/ML Solostar Pen Inject 26 Units into the skin at bedtime. 01/11/24   Newlin, Enobong, MD  Insulin  Pen Needle (PEN NEEDLES) 32G X 4  MM MISC Use to inject insulin  once daily. 01/11/24   Newlin, Enobong, MD  nitrofurantoin , macrocrystal-monohydrate, (MACROBID ) 100 MG capsule Take 1 capsule (100 mg total) by mouth 2 (two) times daily. 12/10/23   Ajewole, Christana, MD  Semaglutide , 1 MG/DOSE, 4 MG/3ML SOPN Inject 1 mg as directed once a week. 01/11/24   Newlin, Enobong, MD    Allergies: Patient has no known allergies.    Review of Systems  Updated Vital Signs BP 129/74   Pulse (!) 131   Temp (!) 103.2 F (39.6 C) (Oral)   Resp 20   SpO2 97%   Physical Exam Vitals and nursing note reviewed.  Constitutional:      General: She is not in acute distress.    Appearance: She is well-developed.  HENT:     Head: Normocephalic and atraumatic.  Eyes:     Conjunctiva/sclera: Conjunctivae normal.  Cardiovascular:     Rate and Rhythm: Regular rhythm. Tachycardia present.     Heart sounds: No murmur heard. Pulmonary:     Effort: Pulmonary effort is normal. No respiratory distress.     Breath sounds: Normal breath sounds.  Abdominal:     Palpations: Abdomen is soft.     Tenderness: There is abdominal tenderness. There is right CVA tenderness.     Comments: Right lower quadrant and right CVA tenderness  Musculoskeletal:        General: No swelling.     Cervical back: Neck supple.  Skin:    General:  Skin is warm and dry.     Capillary Refill: Capillary refill takes less than 2 seconds.  Neurological:     Mental Status: She is alert.  Psychiatric:        Mood and Affect: Mood normal.     (all labs ordered are listed, but only abnormal results are displayed) Labs Reviewed  COMPREHENSIVE METABOLIC PANEL WITH GFR - Abnormal; Notable for the following components:      Result Value   CO2 20 (*)    Glucose, Bld 295 (*)    BUN <5 (*)    AST 43 (*)    ALT 69 (*)    Alkaline Phosphatase 275 (*)    All other components within normal limits  CBC WITH DIFFERENTIAL/PLATELET - Abnormal; Notable for the following components:    WBC 13.4 (*)    Neutro Abs 11.5 (*)    All other components within normal limits  URINALYSIS, ROUTINE W REFLEX MICROSCOPIC - Abnormal; Notable for the following components:   APPearance CLOUDY (*)    Specific Gravity, Urine 1.040 (*)    Glucose, UA >=500 (*)    Hgb urine dipstick MODERATE (*)    Ketones, ur 20 (*)    Protein, ur 100 (*)    Nitrite POSITIVE (*)    Leukocytes,Ua MODERATE (*)    Bacteria, UA MANY (*)    All other components within normal limits  CBG MONITORING, ED - Abnormal; Notable for the following components:   Glucose-Capillary 223 (*)    All other components within normal limits  I-STAT CG4 LACTIC ACID, ED - Abnormal; Notable for the following components:   Lactic Acid, Venous 2.7 (*)    All other components within normal limits  I-STAT CG4 LACTIC ACID, ED - Abnormal; Notable for the following components:   Lactic Acid, Venous 2.9 (*)    All other components within normal limits  CULTURE, BLOOD (SINGLE)  URINE CULTURE  LIPASE, BLOOD  HCG, SERUM, QUALITATIVE    EKG: EKG Interpretation Date/Time:  Monday February 04 2024 19:23:05 EST Ventricular Rate:  148 PR Interval:  134 QRS Duration:  72 QT Interval:  292 QTC Calculation: 458 R Axis:   35  Text Interpretation: Sinus tachycardia with frequent Premature ventricular complexes Possible Anterior infarct , age undetermined Abnormal ECG Confirmed by Midge Golas ([id number]) on 02/05/2024 12:52:18 AM  Radiology: CT ABDOMEN PELVIS W CONTRAST Result Date: 02/05/2024 EXAM: CT ABDOMEN AND PELVIS WITH CONTRAST 02/04/2024 10:28:16 PM TECHNIQUE: CT of the abdomen and pelvis was performed with the administration of intravenous contrast. 50 mL of iohexol  (OMNIPAQUE ) 350 MG/ML injection was administered. Multiplanar reformatted images are provided for review. Automated exposure control, iterative reconstruction, and/or weight-based adjustment of the mA/kV was utilized to reduce the radiation dose to as low as  reasonably achievable. COMPARISON: Findings are compared to 03/14/2022. CLINICAL HISTORY: RLQ abdominal pain. FINDINGS: LOWER CHEST: No acute abnormality. LIVER: The liver is unremarkable. GALLBLADDER AND BILE DUCTS: Status post cholecystectomy. No biliary ductal dilatation. SPLEEN: No acute abnormality. PANCREAS: No acute abnormality. ADRENAL GLANDS: No acute abnormality. KIDNEYS, URETERS AND BLADDER: Developed mild left perinephric inflammatory stranding and subtle heterogeneous cortical enhancement of the left kidney suggesting a unilateral inflammatory process, most commonly pyelonephritis. Macro urinalysis. No hydronephrosis. No intrarenal or ureteral calculi. The right kidney is unremarkable. The bladder is unremarkable. GI AND BOWEL: Appendix normal. The stomach, small bowel, and large bowel are otherwise unremarkable. PERITONEUM AND RETROPERITONEUM: No ascites. No free air. VASCULATURE: Aorta is normal in  caliber. LYMPH NODES: No lymphadenopathy. REPRODUCTIVE ORGANS: No acute abnormality. BONES AND SOFT TISSUES: No acute osseous abnormality. No focal soft tissue abnormality. IMPRESSION: 1. Mild left perinephric inflammatory stranding and subtle heterogeneous cortical enhancement of the left kidney, most consistent with pyelonephritis. No hydronephrosis or calculi. Correlation with urinalysis and urine culture is recommended for further management. 2. Normal appendix with no acute bowel abnormality identified. 3. Status post cholecystectomy. Electronically signed by: Dorethia Molt MD 02/05/2024 01:20 AM EST RP Workstation: [id number]     .Critical Care  Performed by: Logan Ubaldo NOVAK, PA-C Authorized by: Logan Ubaldo NOVAK, PA-C   Critical care provider statement:    Critical care time (minutes):  80   Critical care time was exclusive of:  Separately billable procedures and treating other patients   Critical care was necessary to treat or prevent imminent or life-threatening deterioration of the  following conditions:  Sepsis   Critical care was time spent personally by me on the following activities:  Development of treatment plan with patient or surrogate, discussions with consultants, evaluation of patient's response to treatment, examination of patient, ordering and review of laboratory studies, ordering and review of radiographic studies, ordering and performing treatments and interventions, pulse oximetry, re-evaluation of patient's condition and review of old charts   Care discussed with: admitting provider      Medications Ordered in the ED  sodium chloride  0.9 % bolus 1,000 mL (0 mLs Intravenous Stopped 02/05/24 0104)    And  0.9 %  sodium chloride  infusion ( Intravenous Not Given 02/05/24 0113)  lactated ringers  infusion (has no administration in time range)  lactated ringers  bolus 1,000 mL (1,000 mLs Intravenous New Bag/Given 02/05/24 0107)    And  lactated ringers  bolus 1,000 mL (has no administration in time range)    And  lactated ringers  bolus 1,000 mL (has no administration in time range)    And  lactated ringers  bolus 500 mL (has no administration in time range)  metroNIDAZOLE  (FLAGYL ) IVPB 500 mg (500 mg Intravenous New Bag/Given 02/05/24 0137)  ondansetron  (ZOFRAN ) injection 4 mg (4 mg Intravenous Given 02/04/24 1936)  morphine  (PF) 2 MG/ML injection 4 mg (4 mg Intravenous Given 02/04/24 1936)  iohexol  (OMNIPAQUE ) 350 MG/ML injection 50 mL (50 mLs Intravenous Contrast Given 02/04/24 2229)  cefTRIAXone  (ROCEPHIN ) 2 g in sodium chloride  0.9 % 100 mL IVPB (0 g Intravenous Stopped 02/05/24 0119)  morphine  (PF) 4 MG/ML injection 4 mg (4 mg Intravenous Given 02/05/24 0140)                                    Medical Decision Making Amount and/or Complexity of Data Reviewed Labs: ordered. Radiology: ordered. ECG/medicine tests: ordered.  Risk OTC drugs. Prescription drug management. Decision regarding hospitalization.   This patient presents to the ED for  concern of abdominal pain, this involves an extensive number of treatment options, and is a complaint that carries with it a high risk of complications and morbidity.  The differential diagnosis includes appendicitis, pyelonephritis, nephrolithiasis, TOA, ovarian torsion, musculoskeletal pain, ruptured ectopic pregnancy, others   Co morbidities / Chronic conditions that complicate the patient evaluation  As noted in HPI   Additional history obtained:  Additional history obtained from EMR External records from outside source obtained and reviewed including internal medicine notes   Lab Tests:  I Ordered, and personally interpreted labs.  The pertinent results include: Initial lactic acid 2.7,  repeat 2.9, white count of 13,400, negative pregnancy test, UA with nitrites, many bacteria, moderate leukocytes, moderate hemoglobin   Imaging Studies ordered:  I ordered imaging studies including CT abdomen pelvis with contrast I independently visualized and interpreted imaging which showed pyelonephritis I agree with the radiologist interpretation   Cardiac Monitoring: / EKG:  The patient was maintained on a cardiac monitor.  I personally viewed and interpreted the cardiac monitored which showed an underlying rhythm of: Sinus tachycardia   Problem List / ED Course / Critical interventions / Medication management   I ordered medication including lactated Ringer 's boluses, morphine , Rocephin , Zofran , Flagyl  Reevaluation of the patient after these medicines showed that the patient had improvement in pain but remained tachycardic I have reviewed the patients home medicines and have made adjustments as needed   Consultations Obtained:  I requested consultation with the hospitalist, Dr.Kakrakandy, and discussed lab and imaging findings as well as pertinent plan - they recommend: admission   Test / Admission - Considered:  Patient with SIRS criteria including leukocytosis and  tachycardia, high fever, meeting sepsis criteria with urinary source of infection (pyelonephritis) and elevated lactic acid levels.  Patient treated with sepsis protocol including antibiotics and fluid boluses.  Patient will need admission for further management. CT scan showing evidence of left-sided pyelonephritis, however patient's pain seems to be mostly right sided.  I have added a pelvic ultrasound to evaluate for any evidence of ovarian torsion.  This is pending at the time of admission.  Prior to being seen by medicine for admission, lactic acid increased to greater than 5 and heart rate increased to 150 to 160 bpm.  Critical care was consulted.  Dr. Layman plans to see patient    Final diagnoses:  Sepsis, due to unspecified organism, unspecified whether acute organ dysfunction present [facility])  Pyelonephritis    ED Discharge Orders     None          Logan Ubaldo KATHEE DEVONNA 02/05/24 0219    Logan Ubaldo KATHEE, PA-C 02/05/24 0424    Midge Golas, MD 02/05/24 [phone]  "

## 2024-02-05 NOTE — ED Notes (Signed)
 Pt transported to US 

## 2024-02-05 NOTE — ED Notes (Signed)
 Pt tachycardic in 150s, temp 102.5, advised MD and new orders placed

## 2024-02-05 NOTE — Progress Notes (Signed)
 "  NAME:  Melissa Gilmore, MRN:  [id number], DOB:  [birth year], LOS: 0 ADMISSION DATE:  02/04/2024, CONSULTATION DATE:  02/05/24 REFERRING MD:  EDP, CHIEF COMPLAINT:  pelvis pain and dizziness   History of Present Illness:  28 yo female presented to ED 12/29 afternoon with pelvic pain, lower back pain and dizziness. Pt states that the symptoms began yesterday 12/28. Nothing improves the pain and nothing excerbates it. She states she is not on her menses as she is on depo shot and does not have vaginal bleeding. No dysuria/increased frequency or decreased amounts. Denies vaginal discharge. + n/v/d Pt denies fever but endorses chills. Throughout this she has felt dizzy and generally weak as well.   Upon presentation pt was being appropriately resuscitated and started on empiric abx. However despite these efforts pt's HR continued to increase to 150's, temp increase to 102.5 and lactate from ~2.5 to >5. Imaging revealed pyelonephritis, UA positive.   Additionally, while pt endorses improvement since presentation she states she now is having LLE paresthesia, stating that she feels as though she cannot bare weight on the leg because it feels asleep however she has 5/5 strength and full mobility in leg. No other neuro symptoms or complaints.   Pertinent  Medical History  T2dm Schizophrenia Dissociative identity disorder  Significant Hospital Events: Including procedures, antibiotic start and stop dates in addition to other pertinent events   Admitted to ICU 12/30 12/30-not on pressors, Awake alert interactive  Interim History / Subjective:  Just tired overall Denies any pain or discomfort  Objective    Blood pressure 97/72, pulse (!) 102, temperature 98.5 F (36.9 C), temperature source Oral, resp. rate 19, height 5' 4 (1.626 m), weight 107.4 kg, SpO2 98%, not currently breastfeeding.        Intake/Output Summary (Last 24 hours) at 02/05/2024 1126 Last data filed at 02/05/2024  1100 Gross per 24 hour  Intake 3392.53 ml  Output --  Net 3392.53 ml   Filed Weights   02/05/24 1026  Weight: 107.4 kg    Examination: General: Young lady, does not appear to be in distress HENT: Moist oral mucosa Lungs: Clear breath sounds Cardiovascular: S1-S2 appreciated Abdomen: Bowel sounds appreciated Extremities: No clubbing, no edema Neuro: no focal deficits GU:   I reviewed last 24 h vitals and pain scores, last 48 h intake and output, last 24 h labs and trends, and last 24 h imaging results.  Blood culture negative E. coli noted in urinalysis from 12/06/2023  Resolved problem list   Assessment and Plan   Sepsis secondary to severe UTI Pyelonephritis Fever - Continue antibiotics  Lactic acidosis - Improving  Transaminitis - Will continue to trend  Type 2 diabetes with hyperglycemia - SSI  Left lower extremity paresthesia May need further workup if still present following resolution of acute problems  Hemodynamics improved - Will possibly transfer to medical floor today  Labs   CBC: Recent Labs  Lab 02/04/24 1922 02/05/24 0555  WBC 13.4* 12.2*  NEUTROABS 11.5*  --   HGB 13.3 10.4*  HCT 41.3 31.8*  MCV 82.9 82.0  PLT 241 167    Basic Metabolic Panel: Recent Labs  Lab 02/04/24 1922 02/05/24 0555  NA 136  --   K 4.0  --   CL 102  --   CO2 20*  --   GLUCOSE 295*  --   BUN <5*  --   CREATININE 0.93 0.83  CALCIUM  9.3  --   MG  --  1.1*   GFR: Estimated Creatinine Clearance: 121.8 mL/min (by C-G formula based on SCr of 0.83 mg/dL). Recent Labs  Lab 02/04/24 1922 02/04/24 1930 02/05/24 0105 02/05/24 0408 02/05/24 0555 02/05/24 0621 02/05/24 0735  WBC 13.4*  --   --   --  12.2*  --   --   LATICACIDVEN  --    < > 2.9* 5.3*  --  1.6 1.9   < > = values in this interval not displayed.    Liver Function Tests: Recent Labs  Lab 02/04/24 1922  AST 43*  ALT 69*  ALKPHOS 275*  BILITOT 1.1  PROT 7.9  ALBUMIN 4.3   Recent  Labs  Lab 02/04/24 1922  LIPASE 12   No results for input(s): AMMONIA in the last 168 hours.  ABG    Component Value Date/Time   TCO2 24 08/31/2023 0218     Coagulation Profile: No results for input(s): INR, PROTIME in the last 168 hours.  Cardiac Enzymes: No results for input(s): CKTOTAL, CKMB, CKMBINDEX, TROPONINI in the last 168 hours.  HbA1C: Hemoglobin A1C  Date/Time Value Ref Range Status  10/05/2023 09:33 AM 7.3 (A) 4.0 - 5.6 % Final   HbA1c, POC (controlled diabetic range)  Date/Time Value Ref Range Status  01/11/2024 02:42 PM 8.4 (A) 0.0 - 7.0 % Final  08/06/2020 11:18 AM 8.9 (A) 0.0 - 7.0 % Final   Hgb A1c MFr Bld  Date/Time Value Ref Range Status  05/22/2023 10:40 AM 5.7 (H) 4.8 - 5.6 % Final    Comment:             Prediabetes: 5.7 - 6.4          Diabetes: >6.4          Glycemic control for adults with diabetes: <7.0   02/27/2023 11:33 AM 6.4 (H) 4.8 - 5.6 % Final    Comment:             Prediabetes: 5.7 - 6.4          Diabetes: >6.4          Glycemic control for adults with diabetes: <7.0     CBG: Recent Labs  Lab 02/04/24 1823 02/05/24 0633 02/05/24 0749  GLUCAP 223* 243* 242*    Review of Systems:   As per HPI  Past Medical History:  She,  has a past medical history of Acute cholecystitis with chronic cholecystitis (07/16/2021), Acute lumbar back pain (03/18/2022), Anxiety, Childhood asthma, Depression, Diabetes mellitus without complication (HCC), 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HCC), Dyslipidemia (07/17/2021), Headache, History of abnormal uterine bleeding (07/17/2021), Schizophrenia (HCC), Syncope (03/23/2022), and Urticaria.   Surgical History:   Past Surgical History:  Procedure Laterality Date   CESAREAN SECTION N/A 08/05/2023   Procedure: CESAREAN DELIVERY;  Surgeon: Jayne Vonn DEL, MD;  Location: [facility] ORS;  Service: Obstetrics;  Laterality: N/A;   CHOLECYSTECTOMY N/A 07/17/2021   Procedure: SINGLE SITE  LAPAROSCOPIC CHOLECYSTECTOMY WITH INTRAOPERATIVE CHOLANGIOGRAM; LIVER BIOPSY;  Surgeon: Sheldon Standing, MD;  Location: [facility] ORS;  Service: General;  Laterality: N/A;   IR EPIDUROGRAPHY  03/21/2022   LUMBAR LAMINECTOMY/ DECOMPRESSION WITH MET-RX Right 06/06/2022   Procedure: Right Lumbar three-four Minimally Invasive Surgery Laminectomy/Discectomy;  Surgeon: Cheryle Debby LABOR, MD;  Location: [facility] OR;  Service: Neurosurgery;  Laterality: Right;   WISDOM TOOTH EXTRACTION       Social History:   reports that she has never smoked. She has never been exposed to tobacco smoke. She has never used smokeless  tobacco. She reports that she does not currently use alcohol. She reports that she does not use drugs.   Family History:  Her family history includes Allergic rhinitis in her father; Diabetes in her maternal grandmother; Stomach cancer in her maternal grandmother.   Allergies Allergies[1]   The patient is critically ill with multiple organ systems failure and requires high complexity decision making for assessment and support, frequent evaluation and titration of therapies, application of advanced monitoring technologies and extensive interpretation of multiple databases. Critical Care Time devoted to patient care services described in this note independent of APP/resident time (if applicable)  is 30 minutes.   Jennet Epley MD [facility] Personal pager: See Amion If unanswered, please page CCM On-call: [phone]        [1] No Known Allergies  "

## 2024-02-05 NOTE — ED Notes (Signed)
 Lab results was reported to Nurse, soon as resulted.

## 2024-02-05 NOTE — Plan of Care (Signed)

## 2024-02-05 NOTE — Inpatient Diabetes Management (Signed)
 Inpatient Diabetes Program Recommendations  AACE/ADA: New Consensus Statement on Inpatient Glycemic Control (2015)  Target Ranges:  Prepandial:   less than 140 mg/dL      Peak postprandial:   less than 180 mg/dL (1-2 hours)      Critically ill patients:  140 - 180 mg/dL   Lab Results  Component Value Date   GLUCAP 242 (H) 02/05/2024   HGBA1C 8.4 (A) 01/11/2024    Review of Glycemic Control  Latest Reference Range & Units 02/04/24 18:23 02/05/24 06:33 02/05/24 07:49  Glucose-Capillary 70 - 99 mg/dL 776 (H) 756 (H) 757 (H)  (H): Data is abnormally high  Diabetes history: DM2 Outpatient Diabetes medications: Lantus  26 units every day, Semaglutide  1 mg weekly Current orders for Inpatient glycemic control: Novolog  0-15 units TID  Inpatient Diabetes Program Recommendations:    Please consider:  Lantus  12 units every day (50 % of home dose).  Thank you, Wyvonna Pinal, MSN, CDCES Diabetes Coordinator Inpatient Diabetes Program [phone] (team pager from 8a-5p)

## 2024-02-06 LAB — GLUCOSE, CAPILLARY
Glucose-Capillary: 165 mg/dL — ABNORMAL HIGH (ref 70–99)
Glucose-Capillary: 221 mg/dL — ABNORMAL HIGH (ref 70–99)
Glucose-Capillary: 139 mg/dL — ABNORMAL HIGH (ref 70–99)
Glucose-Capillary: 226 mg/dL — ABNORMAL HIGH (ref 70–99)

## 2024-02-06 LAB — CBC
HCT: 27.5 % — ABNORMAL LOW (ref 36.0–46.0)
Hemoglobin: 9.2 g/dL — ABNORMAL LOW (ref 12.0–15.0)
MCH: 26.7 pg (ref 26.0–34.0)
MCHC: 33.5 g/dL (ref 30.0–36.0)
MCV: 79.9 fL — ABNORMAL LOW (ref 80.0–100.0)
Platelets: 109 K/uL — ABNORMAL LOW (ref 150–400)
RBC: 3.44 MIL/uL — ABNORMAL LOW (ref 3.87–5.11)
RDW: 14.3 % (ref 11.5–15.5)
WBC: 9 K/uL (ref 4.0–10.5)
nRBC: 0.2 % (ref 0.0–0.2)

## 2024-02-06 LAB — COMPREHENSIVE METABOLIC PANEL WITH GFR
ALT: 46 U/L — ABNORMAL HIGH (ref 0–44)
AST: 26 U/L (ref 15–41)
Albumin: 3 g/dL — ABNORMAL LOW (ref 3.5–5.0)
Alkaline Phosphatase: 156 U/L — ABNORMAL HIGH (ref 38–126)
Anion gap: 8 (ref 5–15)
BUN: <5 mg/dL — ABNORMAL LOW (ref 6–20)
CO2: 22 mmol/L (ref 22–32)
Calcium: 8 mg/dL — ABNORMAL LOW (ref 8.9–10.3)
Chloride: 106 mmol/L (ref 98–111)
Creatinine, Ser: 0.7 mg/dL (ref 0.44–1.00)
GFR, Estimated: >60 mL/min
Glucose, Bld: 166 mg/dL — ABNORMAL HIGH (ref 70–99)
Potassium: 3.6 mmol/L (ref 3.5–5.1)
Sodium: 136 mmol/L (ref 135–145)
Total Bilirubin: 1.4 mg/dL — ABNORMAL HIGH (ref 0.0–1.2)
Total Protein: 5.6 g/dL — ABNORMAL LOW (ref 6.5–8.1)

## 2024-02-06 LAB — MAGNESIUM: Magnesium: 1.7 mg/dL (ref 1.7–2.4)

## 2024-02-06 LAB — URINE CULTURE: Culture: <10000 — AB

## 2024-02-06 LAB — HEMOGLOBIN A1C
Hgb A1c MFr Bld: 7.8 % — ABNORMAL HIGH (ref 4.8–5.6)
Mean Plasma Glucose: 177.16 mg/dL

## 2024-02-06 MED ORDER — SODIUM CHLORIDE 0.9 % IV SOLN: INTRAVENOUS | Status: AC

## 2024-02-06 MED ORDER — LOPERAMIDE HCL 2 MG PO CAPS
2.0000 mg | ORAL_CAPSULE | ORAL | Status: DC | PRN
  Administered 2024-02-06: 2 mg via ORAL
  Filled 2024-02-06: qty 1

## 2024-02-06 MED ORDER — MORPHINE SULFATE (PF) 4 MG/ML IV SOLN
4.0000 mg | Freq: Once | INTRAVENOUS | Status: AC
  Administered 2024-02-06: 4 mg via INTRAVENOUS
  Filled 2024-02-06: qty 1

## 2024-02-06 NOTE — Progress Notes (Signed)
 " PROGRESS NOTE  Melissa Gilmore  FMW:[id number] DOB: 31-Dec-1995 DOA: 02/04/2024 PCP: Tanda Bleacher, MD   Brief Narrative: Patient is a 28 year old female with history of diabetes type 2, schizophrenia, dissociative identity disorder who presented with complaint of pelvic pain, lower back pain, dizziness, chills, weakness.  In the emergency department she remained in severe sinus tachycardia, high-grade fever, had elevated lactate.  Imaging showed pyelonephritis, UA suspicious for UTI.  Patient admitted for the management of severe sepsis secondary to UTI/pyelonephritis, admitted under PCCM service.  Patient transferred to [facility] service  on 12/31.  She still has low-grade fever.  Follow-up cultures. possible discharge home tomorrow if cultures available and afebrile  Assessment & Plan:  Principal Problem:   Pyelonephritis   Severe sepsis secondary to pyonephritis/UTI: Presented with chills, pelvic pain, low back pain, weakness, high-grade fever.  Elevated lactate on presentation. Imaging showed left-sided pyonephritis.  UA strongly suspicious for UTI.  Continue current antibiotics.  Follow-up cultures.  No growth so far She still has marked fever this morning, remains in mild sinus tachycardia.  Continue gentle  IV fluids.  Lactate improved.  Leukocytosis resolved  Elevated liver enzymes: Likely from sepsis. Almost resolved  Insulin -dependent diabetes type 2 with hyperglycemia: Currently on sliding scale and Lantus .  Monitor blood sugars.  She takes insulin , semaglutide  at home.  A1c of 7.8  Lower extremity paresthesia: MRI of the lumbar spine done on 10/12 showed Interval right-sided laminectomy at L3-4,mild degenerative disc disease at L3-L4,mild left paracentral disc bulge at L2-3.  Continue supportive care  History of schizophrenia: Currently not on any medications.  Follows with psychiatry  Morbid obesity: BMI 40.6        DVT prophylaxis:enoxaparin  (LOVENOX ) injection 40  mg Start: 02/05/24 1000 SCDs Start: 02/05/24 9486     Code Status: Full Code  Family Communication: None at the bedside  Patient status: Inpatient  Patient is from : Home  Anticipated discharge to: Home  Estimated DC date: Tomorrow   Consultants: PCCM  Procedures: None  Antimicrobials:  Anti-infectives (From admission, onward)    Start     Dose/Rate Route Frequency Ordered Stop   02/05/24 2100  cefTRIAXone  (ROCEPHIN ) 2 g in sodium chloride  0.9 % 100 mL IVPB        2 g 200 mL/hr over 30 Minutes Intravenous Every 24 hours 02/05/24 0517 02/12/24 2059   02/05/24 0030  cefTRIAXone  (ROCEPHIN ) 2 g in sodium chloride  0.9 % 100 mL IVPB        2 g 200 mL/hr over 30 Minutes Intravenous Once 02/05/24 0027 02/05/24 0119   02/05/24 0030  metroNIDAZOLE  (FLAGYL ) IVPB 500 mg        500 mg 100 mL/hr over 60 Minutes Intravenous  Once 02/05/24 0027 02/05/24 0237       Subjective: Patient seen and examined at bedside today.  Hemodynamically stable.  Had a mild fever this morning.  Afebrile during my evaluation.  Still has mild sinus tachycardia with stable blood pressure.  Has mild abdominal discomfort but feels much better today and wants to advance her diet.  Objective: Vitals:   02/05/24 1956 02/06/24 0054 02/06/24 0152 02/06/24 0431  BP: 105/82 (!) 133/107 107/88 104/67  Pulse: (!) 109 (!) 110 (!) 119 (!) 105  Resp: 20 20 20 16   Temp: 99.5 F (37.5 C) 98.8 F (37.1 C) (!) 100.4 F (38 C) 98.3 F (36.8 C)  TempSrc: Oral Oral Oral Oral  SpO2: 100% 100% 99% 96%  Weight:  Height:        Intake/Output Summary (Last 24 hours) at 02/06/2024 9277 Last data filed at 02/06/2024 0304 Gross per 24 hour  Intake 3065.49 ml  Output --  Net 3065.49 ml   Filed Weights   02/05/24 1026  Weight: 107.4 kg    Examination:  General exam: Overall comfortable, not in distress, morbidly obese HEENT: PERRL Respiratory system:  no wheezes or crackles  Cardiovascular system: S1 & S2  heard, RRR.  Gastrointestinal system: Abdomen is nondistended, soft .  Mild mid abdominal tenderness. Central nervous system: Alert and oriented Extremities: No edema, no clubbing ,no cyanosis Skin: No rashes, no ulcers,no icterus     Data Reviewed: I have personally reviewed following labs and imaging studies  CBC: Recent Labs  Lab 02/04/24 1922 02/05/24 0555 02/06/24 0641  WBC 13.4* 12.2* 9.0  NEUTROABS 11.5*  --   --   HGB 13.3 10.4* 9.2*  HCT 41.3 31.8* 27.5*  MCV 82.9 82.0 79.9*  PLT 241 167 109*   Basic Metabolic Panel: Recent Labs  Lab 02/04/24 1922 02/05/24 0555 02/05/24 1049  NA 136  --  135  K 4.0  --  3.7  CL 102  --  105  CO2 20*  --  20*  GLUCOSE 295*  --  257*  BUN <5*  --  <5*  CREATININE 0.93 0.83 0.78  CALCIUM  9.3  --  8.0*  MG  --  1.1* 1.9     Recent Results (from the past 240 hours)  Culture, blood (single)     Status: None (Preliminary result)   Collection Time: 02/05/24 12:45 AM   Specimen: BLOOD  Result Value Ref Range Status   Specimen Description BLOOD SITE NOT SPECIFIED  Final   Special Requests   Final    BOTTLES DRAWN AEROBIC AND ANAEROBIC Blood Culture adequate volume   Culture   Final    NO GROWTH 1 DAY Performed at [facility], [address], [place], [place] [address]    Report Status PENDING  Incomplete  MRSA Next Gen by PCR, Nasal     Status: None   Collection Time: 02/05/24  5:12 AM   Specimen: Nasal Mucosa; Nasal Swab  Result Value Ref Range Status   MRSA by PCR Next Gen NOT DETECTED NOT DETECTED Final    Comment: (NOTE) The GeneXpert MRSA Assay (FDA approved for NASAL specimens only), is one component of a comprehensive MRSA colonization surveillance program. It is not intended to diagnose MRSA infection nor to guide or monitor treatment for MRSA infections. Test performance is not FDA approved in patients less than 32 years old. Performed at [facility], [address], [place], [place] [address]       Radiology Studies: US  Pelvis Complete Result Date: 02/05/2024 EXAM: PELVIC ULTRASOUND TECHNIQUE: Transabdominal pelvic ultrasound using B-mode/gray scaled imaging with color flow was obtained. COMPARISON: None available. CLINICAL HISTORY: Right sided pelvic pain, rule out torsion. FINDINGS: ULTRASOUND FINDINGS: LIMITATIONS: Imaging is limited by the patient's body habitus. UTERUS: The uterus is anteverted. The cervix is unremarkable. No intrauterine masses are seen. The uterus measures 7.2 x 3.2 x 4.6 cm with a calculated volume of 54.8 cm. Uterus demonstrates normal myometrial echotexture. ENDOMETRIAL STRIPE: Endometrium measures 6 mm in thickness. Endometrial stripe is within normal limits. RIGHT OVARY: No right adnexal or ovarian mass. The right ovary measures 3.1 x 1.9 x 1.9 cm with a calculated volume of 5.8 cm. Right ovary is within normal  limits. LEFT OVARY: No left adnexal or ovarian mass. The left ovary measures 3.9 x 2.1 x 2.6 cm with a calculated volume of 11.1 cm. Left ovary is within normal limits. FREE FLUID: No free fluid is seen within the pelvis. IMPRESSION: 1. Slightly limited but normal  pelvic sonogram Electronically signed by: Dorethia Molt MD 02/05/2024 04:11 AM EST RP Workstation: [id number]   CT ABDOMEN PELVIS W CONTRAST Result Date: 02/05/2024 EXAM: CT ABDOMEN AND PELVIS WITH CONTRAST 02/04/2024 10:28:16 PM TECHNIQUE: CT of the abdomen and pelvis was performed with the administration of intravenous contrast. 50 mL of iohexol  (OMNIPAQUE ) 350 MG/ML injection was administered. Multiplanar reformatted images are provided for review. Automated exposure control, iterative reconstruction, and/or weight-based adjustment of the mA/kV was utilized to reduce the radiation dose to as low as reasonably achievable. COMPARISON: Findings are compared to 03/14/2022. CLINICAL HISTORY: RLQ abdominal pain. FINDINGS: LOWER CHEST: No acute abnormality. LIVER: The liver is unremarkable.  GALLBLADDER AND BILE DUCTS: Status post cholecystectomy. No biliary ductal dilatation. SPLEEN: No acute abnormality. PANCREAS: No acute abnormality. ADRENAL GLANDS: No acute abnormality. KIDNEYS, URETERS AND BLADDER: Developed mild left perinephric inflammatory stranding and subtle heterogeneous cortical enhancement of the left kidney suggesting a unilateral inflammatory process, most commonly pyelonephritis. Macro urinalysis. No hydronephrosis. No intrarenal or ureteral calculi. The right kidney is unremarkable. The bladder is unremarkable. GI AND BOWEL: Appendix normal. The stomach, small bowel, and large bowel are otherwise unremarkable. PERITONEUM AND RETROPERITONEUM: No ascites. No free air. VASCULATURE: Aorta is normal in caliber. LYMPH NODES: No lymphadenopathy. REPRODUCTIVE ORGANS: No acute abnormality. BONES AND SOFT TISSUES: No acute osseous abnormality. No focal soft tissue abnormality. IMPRESSION: 1. Mild left perinephric inflammatory stranding and subtle heterogeneous cortical enhancement of the left kidney, most consistent with pyelonephritis. No hydronephrosis or calculi. Correlation with urinalysis and urine culture is recommended for further management. 2. Normal appendix with no acute bowel abnormality identified. 3. Status post cholecystectomy. Electronically signed by: Dorethia Molt MD 02/05/2024 01:20 AM EST RP Workstation: [id number]    Scheduled Meds:  Chlorhexidine  Gluconate Cloth  6 each Topical Daily   enoxaparin  (LOVENOX ) injection  40 mg Subcutaneous Q24H   insulin  aspart  0-15 Units Subcutaneous TID WC   insulin  glargine  15 Units Subcutaneous Daily   Continuous Infusions:  cefTRIAXone  (ROCEPHIN )  IV Stopped (02/05/24 2023)     LOS: 1 day   Ivonne Mustache, MD [facility] P12/31/2025, 7:22 AM  "

## 2024-02-06 NOTE — Plan of Care (Signed)

## 2024-02-06 NOTE — Progress Notes (Signed)
 Primary nurse heard patient crying out in the hallway at 0030, another RN in room assisting with patient. Primary nurse came to the nurses station to notify provider. Primary nurse went back to the room to let patient know that provider was notified and we have to wait for him to put in orders. Patient boyfriend begins to shake his head and states,but she's lying here in severe pain, she can't even move or breath without being in pain. Boy friend was reassured that the doctor was notified and that no medication can be given without provider orders. Provider placed orders within 2 minutes of being contracted. Primary nurse let the patient know that orders were in, as primary nurse exit out the room to obtain medication. Primary nurse heard the boyfriend in the room getting loud. For safety purposes, security was called.   Patient boyfriend told security that he felt like nothing was being done in a timely manner.  Provider was notified at 0033, orders were placed at 0035. Medication was given at 0038. Patient stated instant relief after medication administration, and is now resting.

## 2024-02-06 NOTE — TOC Initial Note (Signed)
 Transition of Care [facility]) - Initial/Assessment Note    Patient Details  Name: Melissa Gilmore MRN: [id number] Date of Birth: [birth year]  Transition of [facility]) CM/SW Contact:    Lauraine FORBES Saa, LCSWA Phone Number: 02/06/2024, 11:37 AM  Clinical Narrative:                  11:37 AM CSW introduced self and role to patient. Patient informed CSW that she resides at home with her mother, fiance, and 67 old baby. Patient stated that her mother provides transport to appointments but confirmed SDOH (food, transportation) needs. CSW provided SDOH resources. Patient informed CSW that she does not have SNF/HH/DME history. Patient confirmed insurance coverage. Patient has a PCP and preferred pharmacy [id number]) on file. No TOC needs identified at this time. TOC will continue to follow.  Expected Discharge Plan: Home/Self Care Barriers to Discharge: Continued Medical Work up   Patient Goals and CMS Choice Patient states their goals for this hospitalization and ongoing recovery are:: to return home          Expected Discharge Plan and Services       Living arrangements for the past 2 months: Single Family Home                                      Prior Living Arrangements/Services Living arrangements for the past 2 months: Single Family Home Lives with:: Significant Other, Minor Children, Adult Children Patient language and need for interpreter reviewed:: Yes Do you feel safe going back to the place where you live?: Yes      Need for Family Participation in Patient Care: No (Comment)     Criminal Activity/Legal Involvement Pertinent to Current Situation/Hospitalization: No - Comment as needed  Activities of Daily Living   ADL Screening (condition at time of admission) Independently performs ADLs?: Yes (appropriate for developmental age) Is the patient deaf or have difficulty hearing?: No Does the patient have difficulty  seeing, even when wearing glasses/contacts?: No Does the patient have difficulty concentrating, remembering, or making decisions?: No  Permission Sought/Granted Permission sought to share information with : Family Supports Permission granted to share information with : No (Contact information on chart)  Share Information with NAME: Melissa Gilmore     Permission granted to share info w Relationship: Mother  Permission granted to share info w Contact Information: [phone]  Emotional Assessment Appearance:: Appears stated age Attitude/Demeanor/Rapport: Engaged Affect (typically observed): Accepting, Appropriate, Adaptable, Calm, Stable, Pleasant Orientation: : Oriented to Self, Oriented to Place, Oriented to  Time, Oriented to Situation Alcohol / Substance Use: Not Applicable Psych Involvement: No (comment)  Admission diagnosis:  Pyelonephritis [N12] Sepsis, due to unspecified organism, unspecified whether acute organ dysfunction present [facility]) [A41.9] Patient Active Problem List   Diagnosis Date Noted   Pyelonephritis 02/05/2024   Status post primary low transverse cesarean section 08/05/2023   Gestational hypertension 08/03/2023   Uterine contractions during pregnancy 08/02/2023   Elevated blood pressure affecting pregnancy in third trimester, antepartum 08/02/2023   Nausea and vomiting during pregnancy 08/02/2023   Diarrhea during pregnancy 08/02/2023   Type 2 diabetes mellitus during pregnancy 08/02/2023   GBS (group B Streptococcus carrier), +RV culture, currently pregnant 07/23/2023   BMI 40.0-44.9, adult (HCC) 04/09/2023   Abnormal antenatal AFP screen 03/25/2023   Pre-existing type 2 diabetes mellitus in pregnancy 03/25/2023   Family  history of spina bifida 02/27/2023   Supervision of high risk pregnancy, antepartum 02/22/2023   Lumbar radiculopathy 06/06/2022   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/2021   Schizophrenia (HCC) 07/17/2021   Depression 07/17/2021    Insulin -requiring or dependent type II diabetes mellitus (HCC) 07/17/2021   PCP:  Tanda Bleacher, MD Pharmacy:   [facility] [address] ([place]), [place] - [address] [place] ([place]) [place] [address] Phone: [phone] Fax: [phone]     Social Drivers of Health (SDOH) Social History: SDOH Screenings   Food Insecurity: Food Insecurity Present (02/05/2024)  Housing: Low Risk (02/05/2024)  Transportation Needs: Unmet Transportation Needs (02/05/2024)  Utilities: Not At Risk (02/05/2024)  Alcohol Screen: Low Risk (12/12/2022)  Depression (PHQ2-9): Medium Risk (02/27/2023)  Financial Resource Strain: Medium Risk (12/12/2022)  Physical Activity: Insufficiently Active (12/12/2022)  Social Connections: Moderately Isolated (12/12/2022)  Stress: No Stress Concern Present (12/12/2022)  Tobacco Use: Low Risk (02/04/2024)  Health Literacy: Adequate Health Literacy (12/12/2022)   SDOH Interventions: Food Insecurity Interventions: [employer] Provided, Inpatient [employer] Transportation Interventions: [employer] Provided, Inpatient TOC, Patient Resources (Friends/Family), Payor Benefit   Readmission Risk Interventions    03/21/2022    9:41 AM  Readmission Risk Prevention Plan  Post Dischage Appt Complete  Medication Screening Complete  Transportation Screening Complete

## 2024-02-06 NOTE — Discharge Instructions (Signed)
 [place] [facility] Address: [address] [place], [place] [address] Phone Number: [phone] Hours of Operation: Residents of [place] can come to obtain food Monday through Friday from 8:30am until 3:30pm. Photo ID and Social Security cards required for all residents of a household. Can come six times a year  [facility] Address: [address] [place], [place] [address] Phone Number: [phone] Hours of Operation: Operates Tuesday-Friday 10:00 a.m. to 1 p.m. Requirements: Referral from DSS needed. May come 6 times a year, 30 days apart. Photo ID and SS required for all residents of household.  [facility] Address: [address] [place], [place] [address] Phone Number: [phone] Hours of Operation: Food pantry is open on the last Saturday of each month from 10:00 am - 12:00 noon. No appointment needed. No qualifications.  [facility] Address: [address] [place], [place] [address] Phone Number: [phone]. [phone] Hours of Operation: Must make reservations to pick up food on Saturdays. Sign ups for Saturday pick up beginning at 8:30 a.m. on Monday morning.  [facility] Address: [address] [place], [place] [address] Phone Number: [phone] Hours of Operation: If you need food, bring proper identification such as a driver's license to receive a bag of food once a month. Requirements: Can come once every 30 days with referral DSS, Holiday representative, Mental health etc. Each referral good for six visits. Photo ID required. *1st visit no referral required.  [facility] Address: [address], [place] [address] Phone Number: [phone]  [facility] Address: [address] [place], [place] [address] Phone Number: [phone] Hours of Operation:  You can register at [email or web address] for free groceries  FREE INDEED FOOD  PANTRY Address: [address], [place] [address] Phone Number: [phone] Hours of Operation: Drive through giveaway, first come first served. Every 3rd Saturday 11AM - 1PM  [facility] [address] Address: [address], [place] [address] Phone Number: [phone]   [place]  [facility] Address: [address] [place], [place] [address] Phone Number: [phone] Hours of Operation: Once a month every 3rd Saturday  [facility] Address: [address] [place], [place] [address] Phone Number: [phone] Hours of Operation: Distribution happens from 9:00-10:00 a.m. every Saturday.     [facility] Address: [address] [place], [place] [address] Phone Number: [phone] Hours of Operation: ONCE a week for the community food distribution held every Tuesday, Wednesday and Thursday from 11 a.m. - 2:00 p.m. Food is available on a first come, first serve basis and varies week to week. No appointment necessary for drive thru pick up.  [facility] Address: [address] [place], [place] [address] Phone Number: [phone] Hours of Operation: Open every 3rd Thursday 9:30 a.m. - 11:00 a.m.  [facility] Address: [address] [place], [place] [address] Phone Number: [phone] Hours of Operation: Please call for hours, directions, and questions  [facility] [place] [facility] Address: [address], [place], [place]  [address] Phone Number: [phone] Website: https://www.Hollyguns.co.za Food Finder app: https://findfood.ghpfa.org  [employer] Address: [address] [place], [place] [address] Phone Number: [phone] Hours of Operation: Contact Bree Harpe. Enrolled Substance Abuse Clients Only  [facility] Address: [address] [place], [address]  Phone Number: [phone] Hours of Operation: Contact Alene Ana. Food pantry open the 3rd Saturday of each month  from 9 a.m. -12 p.m. only  [place] [facility] Address: [address] [place], [place] [address] Phone Number: [phone] Hours of Operation: Contact Loletta Ripple. Emergency food bank open on Saturdays by appointment only  [facility] Address: [address] [place], [place] [address] Phone Number: [phone] Hours of Operation: No specific contact person; Anyone can help  [facility] Address: [address] [place], [place] [address] Phone Number: [phone] Hours of Operation: Contact Julia Oats. Agency gives out a bag of food every Thursday from 2-4 p.m. only, and also provides a community meal every Thursday between 5-6 p.m. Other services provided include rent/mortgage and utility assistance, women's winter shelter, thrift store, and senior adult activities.  [facility] [place] Address: [address] [place], [place] [address] Phone Number: [phone] Hours of Operation: The Emergency Food Assistance Program provides individuals and families with a generous supply of food including meat, fresh vegetables, and nonperishable items. The food box contains five days' worth of food, and each family or individual can receive a box once per month. M, W, Th, Fr 11am-2pm, walk-ins welcome.  [facility] Address: [address] [place], [place] [address]  Phone Number: [phone] Hours of Operation: Contact Asia Blanca Bunch. Tuesdays and Thursdays from 11am - 3pm by appointment only  Sunday, by APPOINTMENT ONLY  [address] of [place], [address] [place], [address], [phone], 3.2 mi from [facility], call in advance for appointment at 10:00am or at 4:00pm, must provide valid photo ID  Monday  9:30am-5:00pm [facility], [address] [place] [id number], [phone], 0.9 mi from [facility], can come four times per year, bring your photo ID and SS cards for other  residents of household, will make appointments for those who work and need to come after 5pm  10:00am-12:00noon [employer], [address]  [place], [address], [phone], 1.7 mi from [facility], can come once every 60 days per household, need referral from DSS, [employer], etc., bring photo ID and SS card   10:00am-1:00pm [employer] [facility],  [address] [place], [address], [phone], 7.7 mi from [facility], can come once every thirty days with a referral from DSS, [employer], Mental Health, etc. -- each referral good for six visits, bring photo ID   10:00am-1:00pm [address], [address], [address], [phone], 4.2 mi from [facility], can come once every 6 months, open to [facility] residents, bring photo ID and copy of a current utility bill in your name, please call first to verify that food is available  6:30pm-8:30pm [facility], [address], [address], [phone], 3.2 mi from [facility], can come once every 30 days, maximum 6 times per year, bring your photo ID and SS numbers for other residents of household  Monday by APPOINTMENT ONLY  [facility], [address] [place], [address],  [phone], 2.5 mi from [facility], call in advance for appointment between 10:00am-2:00pm, bring your photo ID and SS cards for all residents of household, can come once every 3 months  [facility], [address], [address], [phone], 0.7 mi from [facility], call in advance for appointment, can come once every 30 days, bring your photo ID and SS cards for other residents of  household   Tuesday  9:00am-12:00noon [employer], [address] [address], [phone], 1.3 mi from [facility], can come once every 3 months, bring your photo ID and SS numbers for other residents of household   9:00am-1:00pm [facility] [address]   [place], [address], [phone]/Ext [phone], 1.6 mi from [facility], can come once every two weeks  9:30am-5:00pm [employer], [address] [place] [phone], [phone], 0.9 mi from [facility], can come four times per year, bring your photo ID and SS cards for other residents of household, will make appointments for those who work and need to come after 5pm  10:00am-12:00noon [employer], [address]  [place], [address], [phone], 1.7 mi from [facility], can come once every 60 days per household, need referral from DSS, [employer], etc., bring photo ID and SS card  10:00am-1:00pm [facility], [id number],  [phone], 3.8 mi from [facility], with referral from DSS, may come six times, 30 days apart, bring your photo ID and SS cards for all residents of household   10:00am-1:00pm [address] the [facility], [address] [place], [address], [phone], 7.7 mi from [facility], can come once every thirty days with a referral from DSS, [employer], Mental Health, etc.- each referral good for six visits, bring photo ID   10:00am-1:00pm [address], [address], [address], [phone], 4.2 mi from [facility], can come once every 6 months, open to [facility] residents, bring photo ID and copy of a current utility bill in your name, please call first to verify that food is available  2:00pm-3:30pm [facility], [address], [phone], [phone], 3.7 mi from [facility], can come twelve times per year, one bag per family, bring photo ID   FIRST AND THIRD Tuesdays  10:00am-1:00pm, [address] [place], [address], [phone], 7.2 mi from [facility], can come once every 30 days   Tuesday, WHEN FOOD IS AVAILABLE (call)  12:00noon-2:00pm [facility], [address], [address], [phone], 1.5 mi from  [facility], can come once every 30 days, bring photo ID   Tuesday, by APPOINTMENT ONLY  [facility], [address] [place], [address],  [phone], 2.5 mi from [facility], call in advance for appointment between 1:00pm-4:00pm, bring your photo ID and SS cards for all residents of household, can come once every 3 months   [address] of [place], [address], [address], [phone], 5 mi from [facility], call one day ahead for appointment the next day between 10:00am and 12:00noon, can come once every 3 months, bring your photo ID and must qualify according to family income   [facility], [address], [address], [phone], 0.7 mi from [facility], call in advance for appointment, can come once every 30 days, bring your photo ID and SS cards for other residents of household   [address] of [place], Arsenio Larger [place], [address],  [phone], 5.1 mi from [facility], call between 9:00am and 1:00pm M-F to make appointment. Appointments are scheduled for Tues and Thurs from 10:00am-11:30am, bring photo ID, can come once every 6 months, limit three visits over  18 months, then must have referral  Wednesday  9:30am-5:00pm [facility], [address] [place] [phone], [phone], 0.9 mi from [facility], can come four times per year, bring your photo ID and SS cards for other residents of household, will make appointments for those who work and need to come after 5pm  9:30am-11:30am [employer] [facility], [address], [address], [phone], 6.6 mi from [facility], can come once every 30 days, bring your photo ID, and SS cards for other residents of household, each monthly visit requires a written referral from GUM or DSS with number in household on form  10:00am-12:00noon [address], [address] [place], [address], [phone], 1.7 mi from [facility], can come once every 60 days per household, need referral from DSS, [employer], etc., bring photo ID and SS card  10:00am-1:00pm [facility], [id number],  [phone], 3.9 mi from [facility], with referral from DSS, may come six times, 30 days apart, bring your photo ID and SS cards for all residents of household   10:00am-1:00pm [address] the [facility], [address] [place], [address], [phone], 7.7 mi from [facility], can come once every thirty days with a referral from DSS, [employer], Mental Health, etc. - each referral good for six visits, bring photo ID   2:00pm-5:45pm [address] [place], [address], [phone], 3.2 mi from [facility], once every 30 days, first come/first served, limited to first 25, bring photo ID   6:30pm-8:30pm [facility], [address], [address], [phone], 3.2 mi from [facility], can come once every 30 days, maximum 6 times per year, bring your photo ID and SS numbers for other residents of household  FIRST and THIRD Wednesdays   9:00am-12:00noon, [facility], [address] [place], [address], [phone], 4.2 mi from [facility], can come once a month. Please arrive and sign in no later than 11:15 so everyone can be served by 12 noon.  THIRD Wednesday  1:30pm-3:00pm, [place]. [address], [address] [place], [address], [phone] or [phone], 2.1 mi from [facility]  Wednesday, by APPOINTMENT ONLY  [address] [facility] of [place], [address] [address], [phone], 5 mi from [facility], call one day ahead for appointment the next day between 10:00am and 12:00noon, can come once every 3 months, bring your photo ID and must qualify according to family income   [facility], [address] [address], [phone], 0.7 mi from [facility], call in advance for  appointment, can come once every 30 days, bring your photo ID and SS cards for other residents of household   [address] of [place], [address] [place], [address], [phone], 3.2 mi from [facility], call in advance for appointment at 7:00pm, must provide valid photo ID  Thursday  9:00am-12:00noon [employer], [address], [address], [phone], 1.3 mi from [facility], can come once every 3 months, bring your photo ID and SS numbers for other residents of household   9:00am-1:00pm [facility] [address],  [place], [address], [phone]/Ext [phone], 1.6 mi from [facility], can come once every two weeks  9:30am-12:00noon [facility], [address] [place], [phone], 4.5 mi from [facility], can come once every 30 days, must state income [closed Thanksgiving and week of Christmas]  9:30am-5:00pm [employer], [address] [place] [phone], [phone], 0.9 mi from [facility], can come four times per year, bring your photo ID and SS card for other residents of household, will make appointments for those who work and need to come after 5pm  10:00am-1:00pm [facility], [address] [place], [id number],  [phone], 3.9 mi from [facility], with referral from DSS, may come six times, 30 days apart, bring your photo ID and SS cards for all residents of household   10:00am-1:00pm [address] the [facility], [address] [place], [address], [phone], 7.7 mi from [facility], can come once every thirty days with a referral from DSS, [employer], Mental Health, etc.- each referral good for six visits, bring photo ID   10:00am-1:00pm [facility], [address] [address], [phone], 4.2 mi from [facility], can come once every 6 months, open to [facility] residents, bring photo ID and copy of a current utility bill in your name, please call first to  verify that food is available   SUNDAYS BREAKFAST TWO LOCATIONS: 8:00am served in [facility] by Awaken PPL Corporation 8:30am SHUTTLE provided from [facility], served at [employer], [address] LUNCH TWO LOCATIONS [plus one additional third Sunday only] 10:30am - 12:30pm served at [employer], [employer], [place] [address] (1.2 miles from [facility]) 12:30pm served in [place] by Land O'Lakes Team (THIRD Sunday only) 1:30pm served at [facility] by [facility] one location [plus one additional third Sunday only] 5:00pm Every Sunday, served under the bridge at [address] by Lige Reeve Under the 3M Company (.7 miles from [facility]) (THIRD Sunday ONLY) 4:00pm served in the parking garage, across from Nucor Corporation, corner of Kimberly and [place] by Ryland Group Works Ministries MONDAYS BREAKFAST 7:30am served in [employer] by the United States [employer] Corporation and Friends LUNCH 10:30am - 12:30pm served at [employer], [employer], [place] [address] (1.2 miles from [facility]) DINNER TWO LOCATIONS: 7:00pm served in front of the courthouse at the corner of Goldman Sachs and Qwest Communications. by [place] Monday Night Meal (3 blocks from [facility]) 4:30pm served at the [employer], [address] by Bank of New York Company (0.6 miles from [facility]) TUESDAYS BREAKFAST 8:00am - 9:00am served at The TJX Companies, [address] (0.3 miles from [place]) LUNCH 10:30am - 12:30pm served at the E[employer], [employer] [address], (1.2 miles from [place]) DINNER 6:00pm served at CSX Corporation, enter from Capital One and go to the Sonic Automotive, (0.7 miles from [place]) [facility] BREAKFAST 7:00am - 8:00am served at [employer], [employer] [address], (1.2 miles from [place]) LUNCH ONE LOCATION [plus two additional locations listed below] 10:30am - 12:30pm served at [employer], [employer] [address] (1.2 miles from [place]) (FIRST Wednesday ONLY) 11:30am served at Dillard's, [place] [address] (6.6 miles from [place]) (SECOND Wednesday ONLY) 11:00am served at [place]. [place] of [address], [address] (1.3 miles from [place]) [place] TWO  LOCATIONS 6:00pm served at W. R. Berkley, [place] W. Visteon Corporation. (1.3 miles from [facility]) 4:00pm - 6:00pm (hot dogs and chips) served at Levi Strauss of [place] [facility], [address] (1.7 miles from [place]) [place] BREAKFAST NOT AVAILABLE AT THIS TIME LUNCH 10:30am - 12:30pm served at [employer]col[employer], [employer], [place] [address] (1.2 miles from [place]) DINNER 6:00pm served at CSX Corporation, enter from Capital One and go to the Sonic Automotive, (0.7 miles from Nucor Corporation) [place] BREAKFAST NOT AVAILABLE AT THIS TIME LUNCH 10:30am - 12:30pm served at [employer], [employer] [address] (1.2 miles from [place]) DINNER TWO LOCATIONS, [plus one additional first Friday only] 6:00pm served under the bridge at [address] by Lige Reeve Under CSX Corporation. (.7 miles from [facility]) 5:00pm - 7:00pm served at Levi Strauss of [facility], [address] (1.7 miles from [place]) (FIRST Friday ONLY) 5:45 pm - SHUTTLE provided from the LIBRARY at 5:45pm. Served at [facility], [address] [place]. SATURDAYS BREAKFAST TWO LOCATIONS [plus one additional last Saturday only] 8:00am served at [facility] by Delphi 8:30am served at [employer], [address] (2.2 miles from [facility]) (LAST  Saturday ONLY) 8:30am served at Beazer Homes, [address] (5 miles from [place]) LUNCH 10:30am - 12:30pm served at [employer]col[employer], [employer] [address] (1.2 miles from [facility]) DINNER 6:00pm served under the bridge at [address] by World Fuel Services Corporation (0.7 miles from Nucor Corporation)  DIRECTIONS FROM [place] TO ALL MEAL LOCATIONS The Bridge at [address] (.7 miles from [address]) [address] on [place]. Turn Right onto DIRECTV 433 ft. Continue onto [address] under bridge, about 500 ft. Courthouse (3 blocks from [facility]) [place] on [address]. Turn right on [place]  1 block to PPL Corporation (.5 miles from [place]) [place] on [place]. YRC Worldwide. past Brink's Company to EMCOR. Enter from Capital One and go to the Affiliated Computer Services building W. R. Berkley [address] Visteon [address]. (1.3 miles from [facility]) [address] on [place]. Turn Right onto W. Melanie Spires. church will be on the Left. The TJX Companies [address] (.3 miles from [facility]) Go .3 miles on W. Friend[address] Destination is on your right Dillard's at ONEOK (6.6 miles from [address]) [address] on [place] toward W Friend[address] Turn right onto W Friendly Continue onto Alcoa Inc. Continue onto Toll Brothers. 5. Latina Pol is on right Sanmina-SCI Futures trader) [address] (.6 miles from [place]) [place] on [place]. [address] Turn Left onto [address] 0.3 miles Destination is on the Left. [place] Black & Decker at American Express (5 miles from Nucor Corporation) 1. Head south on [address]. Turn right onto [address] Friend[address] Turn slightly left onto Quest Diagnostics Continue onto Quest Diagnostics Turn right at Barnes & Noble Continue to church on right [place] Sounds of [facility] [address] (1.7 miles from [place]) [address] on [place] 1.4 miles College becomes [place]. Elm  Jeryl Moris. Continue 0.6 miles and church will be on theright. [place] Guardian Life Insurance at [address] (2.5 miles from Nucor Corporation) [place] provided from Massachusetts Mutual Life [place]] [place] on [place]. Elm toward Estée Lauder right onto Costco Wholesale left onto Emerson Electric Turn left onto Micron Technology [address] [place]  [place] (2.2 miles from [address]) [address] on [place] 1.4 miles [place] becomes [place]. [place] [address] Turn right onto [address] [place]  [address] and church will be on the Left. Potter's House/Dodge AT&T [address] (1.2 miles from [facility]) 1.Turn right onto [facility] 2.Turn left onto [place] 3.[place] 4.Destination is on your right [place]. Lind[place] Rh[place] of [address] at ToysRus (1.3 miles from [facility]) [address] on [address] Turn left onto Genuine Parts right onto S. [place]. Continue onto KB Home	[place]. Turn left onto Smurfit-Stone Container. Turn right onto WellPoint.

## 2024-02-07 DIAGNOSIS — Z794 Long term (current) use of insulin: Secondary | ICD-10-CM

## 2024-02-07 DIAGNOSIS — F209 Schizophrenia, unspecified: Secondary | ICD-10-CM

## 2024-02-07 DIAGNOSIS — Z6841 Body Mass Index (BMI) 40.0 and over, adult: Secondary | ICD-10-CM

## 2024-02-07 DIAGNOSIS — E118 Type 2 diabetes mellitus with unspecified complications: Secondary | ICD-10-CM

## 2024-02-07 DIAGNOSIS — R6520 Severe sepsis without septic shock: Secondary | ICD-10-CM

## 2024-02-07 LAB — GLUCOSE, CAPILLARY
Glucose-Capillary: 176 mg/dL — ABNORMAL HIGH (ref 70–99)
Glucose-Capillary: 147 mg/dL — ABNORMAL HIGH (ref 70–99)

## 2024-02-07 MED ORDER — MORPHINE SULFATE (PF) 2 MG/ML IV SOLN
1.0000 mg | Freq: Once | INTRAVENOUS | Status: AC
  Administered 2024-02-07: 1 mg via INTRAVENOUS
  Filled 2024-02-07: qty 1

## 2024-02-07 MED ORDER — CEPHALEXIN 750 MG PO CAPS: 750.0000 mg | ORAL_CAPSULE | Freq: Three times a day (TID) | ORAL | 0 refills | Status: DC

## 2024-02-07 MED ORDER — CEPHALEXIN 750 MG PO CAPS: 750.0000 mg | ORAL_CAPSULE | Freq: Three times a day (TID) | ORAL | 0 refills | Status: AC

## 2024-02-07 NOTE — Discharge Summary (Incomplete)
 " Physician Discharge Summary   Patient: Melissa Gilmore MRN: [id number] DOB: [birth year]  Admit date:     02/04/2024  Discharge date: {dischdate:26783}  Discharge Physician: Concepcion Riser   PCP: Tanda Bleacher, MD   Recommendations at discharge:  {Tip this will not be part of the note when signed- Example include specific recommendations for outpatient follow-up, pending tests to follow-up on. (Optional):26781}  ***  Discharge Diagnoses: Principal Problem:   Pyelonephritis  Resolved Problems:   * No resolved hospital problems. [facility] Course: No notes on file  Assessment and Plan: No notes have been filed under this hospital service. Service: Hospitalist     {Tip this will not be part of the note when signed Body mass index is 40.64 kg/m. , ,  (Optional):26781}  {(NOTE) Pain control PDMP Statment (Optional):26782} Consultants: *** Procedures performed: ***  Disposition: {Plan; Disposition:26390} Diet recommendation:  Discharge Diet Orders (From admission, onward)     Start     Ordered   02/07/24 0000  Diet - low sodium heart healthy        02/07/24 1129   02/07/24 0000  Diet Carb Modified        02/07/24 1129           {Diet_Plan:26776} DISCHARGE MEDICATION: Allergies as of 02/07/2024   No Known Allergies      Medication List     STOP taking these medications    ibuprofen  200 MG tablet Commonly known as: ADVIL        TAKE these medications    Accu-Chek Guide Test test strip Generic drug: glucose blood Use to check blood sugar 3 times daily.   Accu-Chek Guide w/Device Kit Use to check blood sugar 3 times daily.   Accu-Chek Softclix Lancets lancets Use to check blood sugar 3 times daily.   acetaminophen  500 MG tablet Commonly known as: TYLENOL  Take 1,000 mg by mouth every 6 (six) hours as needed for mild pain (pain score 1-3) or headache.   cephALEXin  750 MG capsule Commonly known as: Keflex  Take 1 capsule (750 mg  total) by mouth 3 (three) times daily for 5 days.   FreeStyle Libre 3 Plus Sensor Misc Change sensor every 15 days. Use to check blood sugar continuously.   Lantus  SoloStar 100 UNIT/ML Solostar Pen Generic drug: insulin  glargine Inject 26 Units into the skin at bedtime.   Ozempic  (1 MG/DOSE) 4 MG/3ML Sopn Generic drug: Semaglutide  (1 MG/DOSE) Inject 1 mg as directed once a week. What changed: when to take this   TRUEplus 5-Bevel Pen Needles 32G X 4 MM Misc Generic drug: Insulin  Pen Needle Use to inject insulin  once daily.        Follow-up Information     Tanda Bleacher, MD Follow up in 2 week(s).   Specialty: Family Medicine Why: Hospital follow up Contact information: [address] [place] [address] [phone]         Melissa Gilmore, Melissa Gilmore .   Specialty: Certified Nurse Midwife Contact information: [address] [place] [address] [phone]                Discharge Exam: Melissa Gilmore   02/05/24 1026  Weight: 107.4 kg   ***  Condition at discharge: {DC Condition:26389}  The results of significant diagnostics from this hospitalization (including imaging, microbiology, ancillary and laboratory) are listed below for reference.   Imaging Studies: US  Pelvis Complete Result Date: 02/05/2024 EXAM: PELVIC ULTRASOUND TECHNIQUE: Transabdominal pelvic ultrasound using  B-mode/gray scaled imaging with color flow was obtained. COMPARISON: None available. CLINICAL HISTORY: Right sided pelvic pain, rule out torsion. FINDINGS: ULTRASOUND FINDINGS: LIMITATIONS: Imaging is limited by the patient's body habitus. UTERUS: The uterus is anteverted. The cervix is unremarkable. No intrauterine masses are seen. The uterus measures 7.2 x 3.2 x 4.6 cm with a calculated volume of 54.8 cm. Uterus demonstrates normal myometrial echotexture. ENDOMETRIAL STRIPE: Endometrium measures 6 mm in thickness. Endometrial stripe is within normal limits.  RIGHT OVARY: No right adnexal or ovarian mass. The right ovary measures 3.1 x 1.9 x 1.9 cm with a calculated volume of 5.8 cm. Right ovary is within normal limits. LEFT OVARY: No left adnexal or ovarian mass. The left ovary measures 3.9 x 2.1 x 2.6 cm with a calculated volume of 11.1 cm. Left ovary is within normal limits. FREE FLUID: No free fluid is seen within the pelvis. IMPRESSION: 1. Slightly limited but normal  pelvic sonogram Electronically signed by: Dorethia Molt MD 02/05/2024 04:11 AM EST RP Workstation: [id number]   CT ABDOMEN PELVIS W CONTRAST Result Date: 02/05/2024 EXAM: CT ABDOMEN AND PELVIS WITH CONTRAST 02/04/2024 10:28:16 PM TECHNIQUE: CT of the abdomen and pelvis was performed with the administration of intravenous contrast. 50 mL of iohexol  (OMNIPAQUE ) 350 MG/ML injection was administered. Multiplanar reformatted images are provided for review. Automated exposure control, iterative reconstruction, and/or weight-based adjustment of the mA/kV was utilized to reduce the radiation dose to as low as reasonably achievable. COMPARISON: Findings are compared to 03/14/2022. CLINICAL HISTORY: RLQ abdominal pain. FINDINGS: LOWER CHEST: No acute abnormality. LIVER: The liver is unremarkable. GALLBLADDER AND BILE DUCTS: Status post cholecystectomy. No biliary ductal dilatation. SPLEEN: No acute abnormality. PANCREAS: No acute abnormality. ADRENAL GLANDS: No acute abnormality. KIDNEYS, URETERS AND BLADDER: Developed mild left perinephric inflammatory stranding and subtle heterogeneous cortical enhancement of the left kidney suggesting a unilateral inflammatory process, most commonly pyelonephritis. Macro urinalysis. No hydronephrosis. No intrarenal or ureteral calculi. The right kidney is unremarkable. The bladder is unremarkable. GI AND BOWEL: Appendix normal. The stomach, small bowel, and large bowel are otherwise unremarkable. PERITONEUM AND RETROPERITONEUM: No ascites. No free air. VASCULATURE:  Aorta is normal in caliber. LYMPH NODES: No lymphadenopathy. REPRODUCTIVE ORGANS: No acute abnormality. BONES AND SOFT TISSUES: No acute osseous abnormality. No focal soft tissue abnormality. IMPRESSION: 1. Mild left perinephric inflammatory stranding and subtle heterogeneous cortical enhancement of the left kidney, most consistent with pyelonephritis. No hydronephrosis or calculi. Correlation with urinalysis and urine culture is recommended for further management. 2. Normal appendix with no acute bowel abnormality identified. 3. Status post cholecystectomy. Electronically signed by: Dorethia Molt MD 02/05/2024 01:20 AM EST RP Workstation: [id number]    Microbiology: Results for orders placed or performed during the hospital encounter of 02/04/24  Culture, blood (single)     Status: None (Preliminary result)   Collection Time: 02/05/24 12:45 AM   Specimen: BLOOD  Result Value Ref Range Status   Specimen Description BLOOD SITE NOT SPECIFIED  Final   Special Requests   Final    BOTTLES DRAWN AEROBIC AND ANAEROBIC Blood Culture adequate volume   Culture   Final    NO GROWTH 2 DAYS Performed at [facility], [address], [place], [place] [address]    Report Status PENDING  Incomplete  Urine Culture     Status: Abnormal   Collection Time: 02/05/24  2:13 AM   Specimen: Urine, Clean Catch  Result Value Ref Range Status   Specimen Description URINE, CLEAN CATCH  Final   Special Requests NONE  Final   Culture (A)  Final    <10,000 COLONIES/mL INSIGNIFICANT GROWTH Performed at [facility], [address] [place], [place] [address]    Report Status 02/06/2024 FINAL  Final  MRSA Next Gen by PCR, Nasal     Status: None   Collection Time: 02/05/24  5:12 AM   Specimen: Nasal Mucosa; Nasal Swab  Result Value Ref Range Status   MRSA by PCR Next Gen NOT DETECTED NOT DETECTED Final    Comment: (NOTE) The GeneXpert MRSA Assay (FDA approved for NASAL specimens only), is one component  of a comprehensive MRSA colonization surveillance program. It is not intended to diagnose MRSA infection nor to guide or monitor treatment for MRSA infections. Test performance is not FDA approved in patients less than 21 years old. Performed at [facility], [address] [place], [place] [address]     Labs: CBC: Recent Labs  Lab 02/04/24 1922 02/05/24 0555 02/06/24 0641  WBC 13.4* 12.2* 9.0  NEUTROABS 11.5*  --   --   HGB 13.3 10.4* 9.2*  HCT 41.3 31.8* 27.5*  MCV 82.9 82.0 79.9*  PLT 241 167 109*   Basic Metabolic Panel: Recent Labs  Lab 02/04/24 1922 02/05/24 0555 02/05/24 1049 02/06/24 0641  NA 136  --  135 136  K 4.0  --  3.7 3.6  CL 102  --  105 106  CO2 20*  --  20* 22  GLUCOSE 295*  --  257* 166*  BUN <5*  --  <5* <5*  CREATININE 0.93 0.83 0.78 0.70  CALCIUM  9.3  --  8.0* 8.0*  MG  --  1.1* 1.9 1.7   Liver Function Tests: Recent Labs  Lab 02/04/24 1922 02/06/24 0641  AST 43* 26  ALT 69* 46*  ALKPHOS 275* 156*  BILITOT 1.1 1.4*  PROT 7.9 5.6*  ALBUMIN 4.3 3.0*   CBG: Recent Labs  Lab 02/06/24 1209 02/06/24 1735 02/06/24 2050 02/07/24 0820 02/07/24 1144  GLUCAP 221* 139* 226* 176* 147*    Discharge time spent: {LESS THAN/GREATER THAN:26388} 30 minutes.  Signed: Concepcion Riser, MD [facility] 02/07/2024 "

## 2024-02-07 NOTE — Plan of Care (Signed)
 Patient calm and cooperative A&O X4, family at beside. Patient complaint of pain treated  with one time dose medication, order placed by hospitalist upon notification of complaint. PRN nausea medication given with pain medication. Patient left with call bell in reach, bed in lowest position and call bell in reach.  Problem: Education: Goal: Ability to describe self-care measures that may prevent or decrease complications (Diabetes Survival Skills Education) will improve Outcome: Progressing   Problem: Coping: Goal: Ability to adjust to condition or change in health will improve Outcome: Progressing   Problem: Fluid Volume: Goal: Ability to maintain a balanced intake and output will improve Outcome: Progressing   Problem: Health Behavior/Discharge Planning: Goal: Ability to identify and utilize available resources and services will improve Outcome: Progressing   Problem: Metabolic: Goal: Ability to maintain appropriate glucose levels will improve Outcome: Progressing   Problem: Nutritional: Goal: Maintenance of adequate nutrition will improve Outcome: Progressing   Problem: Skin Integrity: Goal: Risk for impaired skin integrity will decrease Outcome: Progressing   Problem: Health Behavior/Discharge Planning: Goal: Ability to manage health-related needs will improve Outcome: Progressing   Problem: Clinical Measurements: Goal: Ability to maintain clinical measurements within normal limits will improve Outcome: Progressing   Problem: Activity: Goal: Risk for activity intolerance will decrease Outcome: Progressing   Problem: Coping: Goal: Level of anxiety will decrease Outcome: Progressing   Problem: Safety: Goal: Ability to remain free from injury will improve Outcome: Progressing

## 2024-02-07 NOTE — TOC Transition Note (Signed)
 Transition of Care [facility]) - Discharge Note   Patient Details  Name: Melissa Gilmore MRN: [id number] Date of Birth: 1995/06/25  Transition of Care [facility]) CM/SW Contact:  Roxie KANDICE Stain, RN Phone Number: 02/07/2024, 11:33 AM   Clinical Narrative:     Patient stable for discharge. Patient to follow-up with PCP. Clinical social worker's, Lauraine, gave patient SDOH resources.     Barriers to Discharge: Continued Medical Work up   Patient Goals and CMS Choice Patient states their goals for this hospitalization and ongoing recovery are:: to return home          Discharge Placement             Home         Discharge Plan and Services Additional resources added to the After Visit Summary for                                       Social Drivers of Health (SDOH) Interventions SDOH Screenings   Food Insecurity: Food Insecurity Present (02/05/2024)  Housing: Low Risk (02/05/2024)  Transportation Needs: Unmet Transportation Needs (02/05/2024)  Utilities: Not At Risk (02/05/2024)  Alcohol Screen: Low Risk (12/12/2022)  Depression (PHQ2-9): Medium Risk (02/27/2023)  Financial Resource Strain: Medium Risk (12/12/2022)  Physical Activity: Insufficiently Active (12/12/2022)  Social Connections: Moderately Isolated (12/12/2022)  Stress: No Stress Concern Present (12/12/2022)  Tobacco Use: Low Risk (02/04/2024)  Health Literacy: Adequate Health Literacy (12/12/2022)     Readmission Risk Interventions    02/07/2024   11:32 AM 03/21/2022    9:41 AM  Readmission Risk Prevention Plan  Post Dischage Appt Complete Complete  Medication Screening Complete Complete  Transportation Screening Complete Complete

## 2024-02-08 ENCOUNTER — Other Ambulatory Visit: Payer: Self-pay

## 2024-02-08 ENCOUNTER — Encounter: Payer: Self-pay | Admitting: Pharmacist

## 2024-02-08 ENCOUNTER — Ambulatory Visit: Payer: Self-pay | Admitting: Family

## 2024-02-08 ENCOUNTER — Ambulatory Visit: Payer: MEDICAID | Attending: Nurse Practitioner | Admitting: Pharmacist

## 2024-02-08 DIAGNOSIS — E1165 Type 2 diabetes mellitus with hyperglycemia: Secondary | ICD-10-CM

## 2024-02-08 DIAGNOSIS — Z7985 Long-term (current) use of injectable non-insulin antidiabetic drugs: Secondary | ICD-10-CM | POA: Diagnosis not present

## 2024-02-08 DIAGNOSIS — Z794 Long term (current) use of insulin: Secondary | ICD-10-CM

## 2024-02-08 DIAGNOSIS — R6520 Severe sepsis without septic shock: Secondary | ICD-10-CM | POA: Insufficient documentation

## 2024-02-08 MED ORDER — SEMAGLUTIDE (2 MG/DOSE) 8 MG/3ML ~~LOC~~ SOPN
2.0000 mg | PEN_INJECTOR | SUBCUTANEOUS | 1 refills | Status: AC
  Filled 2024-02-08: qty 9, 84d supply, fill #0
  Filled 2024-02-14: qty 3, 28d supply, fill #0
  Filled 2024-02-15 – 2024-03-14 (×2): qty 9, 84d supply, fill #0

## 2024-02-08 NOTE — Progress Notes (Signed)
 noted

## 2024-02-08 NOTE — Progress Notes (Signed)
" ° ° °  S:     No chief complaint on file.  29 y.o. female who presents for diabetes evaluation, education, and management. PMH is significant for T2DM, obesity, depression. Patient was referred and last seen by Primary Care Provider, Dr. Tanda, on 10/05/23.  I saw her on 01/11/2024. A1c was 8.4%, up from 7.3% prior. I increased Ozempic  to 1 mg weekly and decreased her Lantus  to 26 units daily.   Since that appt, she was admitted to the hospital 02/04/24 - 02/07/2024 with sepsis secondary to pyelonephritis. She was treated with IV ceftriaxone  and discharged on PO cephalexin  to continue for 5 days post discharge. No changes made to her diabetes medications.   Today, she reports that she's feeling better since getting out of the hospital. She endorses adherence to Ozempic  and Lantus . Has taken four 1 mg injections. Denies any NV, abdominal pain. Denies any changes in vision.   Family/Social History:  Fhx:  no pertinent positives  Tobacco: never smoker  Alcohol: none reported  Current diabetes medications include: Lantus  26 units daily, Ozempic  1 mg weekly Patient reports adherence to taking all medications as prescribed.  Insurance coverage: [employer]  Patient denies hypoglycemic events.  Patient denies nocturia (nighttime urination).  Patient reports rare symptoms of neuropathy (nerve pain). Patient denies visual changes. Patient reports self foot exams.   Patient reported dietary habits:  -Reports decrease in appetite since restarting the Ozempic .   O:  Date of Download: 02/08/24, 30-day % Time CGM is active: 100% Average Glucose: 186 mg/dL Glucose Management Indicator: 7.8 Time in Goal:  - Time in range 70-180: 51% - Time above range: 49% - Time below range: 0% Observed patterns:  Lab Results  Component Value Date   HGBA1C 7.8 (H) 02/06/2024   There were no vitals filed for this visit.  Lipid Panel     Component Value Date/Time   CHOL 170 12/14/2022 1458   TRIG 116  12/14/2022 1458   HDL 41 12/14/2022 1458   CHOLHDL 4.1 12/14/2022 1458   LDLCALC 108 (H) 12/14/2022 1458    Clinical Atherosclerotic Cardiovascular Disease (ASCVD): No  The ASCVD Risk score (Arnett DK, et al., 2019) failed to calculate for the following reasons:   The 2019 ASCVD risk score is only valid for ages 10 to 74   * - Cholesterol units were assumed   Patient is participating in a Managed Medicaid Plan:  Yes   A/P: Diabetes longstanding currently above goal given A1c. AGP report from her Herlene app looks okay. Her TIR over the last 30 days is 51% with a GMI of 7.8%. Will continue the process of titrating Ozempic . Patient is not currently hypoglycemic but is able to verbalize appropriate hypoglycemia management plan. Medication adherence appears to be appropriate. -Increased dose of Ozempic  to 2 mg weekly.  -Continue Lantus  26 units daily.  -Patient educated on purpose, proper use, and potential adverse effects of Ozempic .  -Extensively discussed pathophysiology of diabetes, recommended lifestyle interventions, dietary effects on blood sugar control.  -Counseled on s/sx of and management of hypoglycemia.  -Next A1c anticipated 04/2024.   Written patient instructions provided. Patient verbalized understanding of treatment plan.  Total time in face to face counseling 30 minutes.    Follow-up:  Pharmacist in 1 month.  Herlene Fleeta Morris, PharmD, [name], CPP Clinical Pharmacist [facility] [phone]   "

## 2024-02-10 LAB — CULTURE, BLOOD (SINGLE)
Culture: NO GROWTH
Special Requests: ADEQUATE

## 2024-02-11 ENCOUNTER — Telehealth: Payer: Self-pay

## 2024-02-11 NOTE — Transitions of Care (Post Inpatient/ED Visit) (Signed)
" ° °  02/11/2024  Name: Melissa Gilmore MRN: [id number] DOB: February 11, 1995  Today's TOC FU Call Status: Today's TOC FU Call Status:: Unsuccessful Call (1st Attempt) Unsuccessful Call (1st Attempt) Date: 02/11/24  Attempted to reach the patient regarding the most recent Inpatient/ED visit.  Follow Up Plan: Additional outreach attempts will be made to reach the patient to complete the Transitions of Care (Post Inpatient/ED visit) call.   Dominique Calvey J. Zamorah Ailes RN, MSN [facility], [facility] RN Care Manager Direct Dial: [phone]  Fax: [phone] Website: [email or web address]   "

## 2024-02-12 ENCOUNTER — Ambulatory Visit: Payer: MEDICAID

## 2024-02-14 ENCOUNTER — Other Ambulatory Visit: Payer: Self-pay

## 2024-02-15 ENCOUNTER — Other Ambulatory Visit: Payer: Self-pay

## 2024-02-15 ENCOUNTER — Ambulatory Visit: Payer: MEDICAID | Attending: Obstetrics and Gynecology | Admitting: Physical Therapy

## 2024-02-15 DIAGNOSIS — G8929 Other chronic pain: Secondary | ICD-10-CM | POA: Insufficient documentation

## 2024-02-15 DIAGNOSIS — M5441 Lumbago with sciatica, right side: Secondary | ICD-10-CM | POA: Insufficient documentation

## 2024-02-15 DIAGNOSIS — M6281 Muscle weakness (generalized): Secondary | ICD-10-CM | POA: Insufficient documentation

## 2024-02-15 DIAGNOSIS — R252 Cramp and spasm: Secondary | ICD-10-CM | POA: Insufficient documentation

## 2024-02-15 DIAGNOSIS — R293 Abnormal posture: Secondary | ICD-10-CM | POA: Insufficient documentation

## 2024-02-15 DIAGNOSIS — R262 Difficulty in walking, not elsewhere classified: Secondary | ICD-10-CM | POA: Insufficient documentation

## 2024-02-15 DIAGNOSIS — R208 Other disturbances of skin sensation: Secondary | ICD-10-CM | POA: Insufficient documentation

## 2024-02-18 ENCOUNTER — Encounter: Payer: Self-pay | Admitting: Physical Therapy

## 2024-02-18 ENCOUNTER — Ambulatory Visit: Payer: MEDICAID | Admitting: Physical Therapy

## 2024-02-18 DIAGNOSIS — R262 Difficulty in walking, not elsewhere classified: Secondary | ICD-10-CM | POA: Diagnosis present

## 2024-02-18 DIAGNOSIS — R208 Other disturbances of skin sensation: Secondary | ICD-10-CM | POA: Diagnosis present

## 2024-02-18 DIAGNOSIS — M6281 Muscle weakness (generalized): Secondary | ICD-10-CM

## 2024-02-18 DIAGNOSIS — G8929 Other chronic pain: Secondary | ICD-10-CM | POA: Diagnosis present

## 2024-02-18 DIAGNOSIS — R293 Abnormal posture: Secondary | ICD-10-CM | POA: Diagnosis present

## 2024-02-18 DIAGNOSIS — R252 Cramp and spasm: Secondary | ICD-10-CM | POA: Diagnosis present

## 2024-02-18 DIAGNOSIS — M5441 Lumbago with sciatica, right side: Secondary | ICD-10-CM | POA: Diagnosis present

## 2024-02-18 NOTE — Therapy (Signed)
 " OUTPATIENT PHYSICAL THERAPY FEMALE PELVIC TREATMENT   Patient Name: Melissa Gilmore MRN: [id number] DOB:May 22, 1995, 29 y.o., female Today's Date: 02/18/2024  END OF SESSION:  PT End of Session - 02/18/24 0935     Visit Number 2    Date for Recertification  07/29/24    Authorization Type [employer] authorized 10 visits 02/12/24-07/28/24 auth#[id number]    Authorization - Visit Number 1    Authorization - Number of Visits 10    PT Start Time 0844    PT Stop Time 0927    PT Time Calculation (min) 43 min    Activity Tolerance Patient tolerated treatment well    Behavior During Therapy [facility] for tasks assessed/performed           Past Medical History:  Diagnosis Date   Acute cholecystitis with chronic cholecystitis 07/16/2021   Acute lumbar back pain 03/18/2022   Anxiety    Childhood asthma    Depression    Major Depression disorder   Diabetes mellitus without complication [facility])    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HCC)    Dyslipidemia 07/17/2021   Headache    Migraines   History of abnormal uterine bleeding 07/17/2021   Schizophrenia (HCC)    Syncope 03/23/2022   Urticaria    Past Surgical History:  Procedure Laterality Date   CESAREAN SECTION N/A 08/05/2023   Procedure: CESAREAN DELIVERY;  Surgeon: Jayne Vonn DEL, MD;  Location: [facility] ORS;  Service: Obstetrics;  Laterality: N/A;   CHOLECYSTECTOMY N/A 07/17/2021   Procedure: SINGLE SITE LAPAROSCOPIC CHOLECYSTECTOMY WITH INTRAOPERATIVE CHOLANGIOGRAM; LIVER BIOPSY;  Surgeon: Sheldon Standing, MD;  Location: [facility] ORS;  Service: General;  Laterality: N/A;   IR EPIDUROGRAPHY  03/21/2022   LUMBAR LAMINECTOMY/ DECOMPRESSION WITH MET-RX Right 06/06/2022   Procedure: Right Lumbar three-four Minimally Invasive Surgery Laminectomy/Discectomy;  Surgeon: Cheryle Debby LABOR, MD;  Location: [facility];  Service: Neurosurgery;  Laterality: Right;   WISDOM TOOTH EXTRACTION     Patient Active Problem List   Diagnosis Date Noted   Severe  sepsis (HCC) 02/08/2024   Pyelonephritis 02/05/2024   Status post primary low transverse cesarean section 08/05/2023   Gestational hypertension 08/03/2023   Uterine contractions during pregnancy 08/02/2023   Elevated blood pressure affecting pregnancy in third trimester, antepartum 08/02/2023   Nausea and vomiting during pregnancy 08/02/2023   Diarrhea during pregnancy 08/02/2023   Type 2 diabetes mellitus during pregnancy 08/02/2023   GBS (group B Streptococcus carrier), +RV culture, currently pregnant 07/23/2023   BMI 40.0-44.9, adult (HCC) 04/09/2023   Abnormal antenatal AFP screen 03/25/2023   Pre-existing type 2 diabetes mellitus in pregnancy 03/25/2023   Family history of spina bifida 02/27/2023   Supervision of high risk pregnancy, antepartum 02/22/2023   Lumbar radiculopathy 06/06/2022   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/2021   Schizophrenia (HCC) 07/17/2021   Depression 07/17/2021   Type 2 diabetes mellitus with complication, with long-term current use of insulin  (HCC) 07/17/2021    PCP: Tanda Bleacher, MD  REFERRING PROVIDER: Jeralyn Crutch, MD  REFERRING DIAG: R10.20,G89.29 (ICD-10-CM) - Chronic pelvic pain in female M79.10 (ICD-10-CM) - Myalgia  THERAPY DIAG:  Chronic right-sided low back pain with right-sided sciatica  Difficulty in walking, not elsewhere classified  Muscle weakness (generalized)  Rationale for Evaluation and Treatment: Rehabilitation  ONSET DATE: 3-4 months ago  SUBJECTIVE:        Patient reports her back pain is much better.  She had a double kidney infection and was in the hospital over New Years. She  tried to do her exercises as much as she could.   From Eval: Patient reports that she has had pelvic pain that started 3-4 months ago. Hurts when she sits, when she is standing and holding baby on hip. She is 6 months PP. Still has back pain. Pain can be up to a 10/10, wraps from lumbar area to the front. Intercourse used to be  painful early postpartum.  Had a back injury and did PT during pregnancy. Feels tired.  Had a C section after 4 days of labor                                                                                                                                                                                     SUBJECTIVE STATEMENT: FUNCTIONAL LIMITATIONS: sitting is hard, carrying baby, cleaning- kneeling, bending forward, cannot get up from the floor  PERTINENT HISTORY:  Medications for current condition: advil  Surgeries: back surgery- patient does not remember exactly April 2024 Other:  Sexual abuse: No  PAIN:  Are you having pain? Yes NPRS scale: up to 10/10 Pain location: External  Pain type: aching Pain description: constant   Aggravating factors: sitting, getting up from sitting, kneeling Relieving factors: advil  sometimes  PRECAUTIONS: None  RED FLAGS: None   WEIGHT BEARING RESTRICTIONS: No  FALLS:  Has patient fallen in last 6 months? No  OCCUPATION: stay at home mom  ACTIVITY LEVEL : active with baby  PLOF: Independent  PATIENT GOALS: to not have back pain and pelvic pain   BOWEL MOVEMENT: constipated sometimes   URINATION: Pain with urination: No Fully empty bladder: Yes:                                           Post-void dribble: No Stream: Strong Urgency: Yes sometimes Frequency:during the day no                                                        Nocturia: No   Leakage: none Pads/briefs: No  INTERCOURSE: no issues   PREGNANCY: Vaginal deliveries 0 Tearing No Episiotomy No C-section deliveries 1 Currently pregnant No  PROLAPSE: None   OBJECTIVE:  Note: Objective measures were completed at Evaluation unless otherwise noted.  DIAGNOSTIC FINDINGS:  Post-void residual: Voiding Cystourethrogram (VCUG):  Ultrasound:   PATIENT SURVEYS:    PFIQ-7: 150 UIQ-7 48 CRAIG -7 33  POPIQ-7 67   COGNITION: Overall cognitive status:  Within functional limits for tasks assessed     SENSATION: Light touch: Appears intact  LUMBAR SPECIAL TESTS:  Straight leg raise test: Positive for weakness  FUNCTIONAL TESTS:   Single leg stance:pelvic sway bilat  Rt:  Lt: Sit-up test: has to use arms to lift Squat: Bed mobility: aggravating   GAIT: Assistive device utilized: None Comments: antalgic  POSTURE: rounded shoulders and forward head, scoliosis   LUMBARAROM/PROM:  A/PROM A/PROM  Eval (% available)  Flexion 70 irritating  Extension 70  Right lateral flexion 70  Left lateral flexion 70  Right rotation 70  Left rotation 70   (Blank rows = not tested)  LOWER EXTREMITY ROM: full but irritating on right    LOWER EXTREMITY FFU:apojuzmjo hips 4-/5  PALPATION:  General: tight and tender C section scar  Pelvic Alignment: uneven  Abdominal: C section scar  Diastasis: Yes:   Distortion: No  Breathing: upper chest Scar tissue: Yes:   Active Straight Leg Raise: painful on left                 External Perineal Exam: to be assessed                              Internal Pelvic Floor: to be assessed   Patient confirms identification and approves PT to assess internal pelvic floor and treatment Yes All internal or external pelvic floor assessments and/or treatments are completed with proper hand hygiene and gloves hands. If needed gloves are changed with hand hygiene during patient care time.  PELVIC MMT:   MMT eval  Vaginal   Internal Anal Sphincter   External Anal Sphincter   Puborectalis   (Blank rows = not tested)        TONE: to be assessed   PROLAPSE: to be assessed   TODAY'S TREATMENT:                                                                                                                              DATE:   02/18/2024 NuStep Level 3 5 mins- PT present to discuss status Seated hip IR/ER x 10 each direction Seated purple ball press 2 x 10 Hooklying PPT 2 x 10 Discussion of  kidney infection and events that led up to it Hooklying alt hand and knee press with purple ball x 10 each direction  Hooklying TA activation + ball squeeze 2 x 10 Hooklying bridge x 10 SL clamshell  2 x 10 bilateral  Supine LTR x 10 each 5 sec hold Hooklying TA activation + hip abduction with yellow loop 2 x 10   01/29/2024 Examination completed, findings reviewed, pt educated on POC, HEP, and female pelvic floor anatomy, reasoning with pelvic floor assessment internally with pt consent. Pt motivated to participate in PT and agreeable to attempt recommendations.  PATIENT EDUCATION:  Education details: Pt was educated on relevant anatomy, exam findings, home exercise program, plan of care, expectations of PT and scar massage  Person educated: Patient Education method: Explanation, Demonstration, Tactile cues, Verbal cues, and Handouts Education comprehension: verbalized understanding, returned demonstration, verbal cues required, tactile cues required, and needs further education  HOME EXERCISE PROGRAM: Access Code: [email or web address] URL: [email or web address] Date: 02/18/2024 Prepared by: Kristeen Sar  Exercises - Supine Bridge  - 1 x daily - 7 x weekly - 1-2 sets - 10 reps - Clamshell  - 1 x daily - 7 x weekly - 2 sets - 10 reps - Supine Lower Trunk Rotation  - 1 x daily - 7 x weekly - 1 sets - 10 reps - 5 hold - Supine Posterior Pelvic Tilt  - 1 x daily - 7 x weekly - 2 sets - 10 reps  Patient Education - Bowel Emptying Techniques - High-Fiber Diet to Support Pelvic Health - Bowel Emptying Techniques - Abdominal Massage for Constipation  ASSESSMENT:  CLINICAL IMPRESSION: Melissa Gilmore presents to first follow up appointment since evaluation. She found out that the source of her pelvic pain and back pain was due to a double kidney infection. After he evaluation she ended up getting a fever that progressively got worse. She is all clear now and feels better. Treatment  session focused on gentle core acetification and hip strengthening. Educated patient on breathing technique while performing exercises. Overall, she tolerated treatment session well and verbalized her back felt better.  Patient demonstrates good rehab potential to achieve stated goals through skilled therapy intervention.    OBJECTIVE IMPAIRMENTS: decreased activity tolerance, decreased balance, decreased endurance, decreased mobility, difficulty walking, decreased ROM, decreased strength, hypomobility, increased muscle spasms, obesity, and pain.   ACTIVITY LIMITATIONS: lifting, bending, standing, squatting, stairs, bed mobility, toileting, locomotion level, and caring for others  PARTICIPATION LIMITATIONS: community activity and occupation  PERSONAL FACTORS: Age, Fitness, and Time since onset of injury/illness/exacerbation are also affecting patient's functional outcome.   REHAB POTENTIAL: Good  CLINICAL DECISION MAKING: Evolving/moderate complexity  EVALUATION COMPLEXITY: Moderate   GOALS: Goals reviewed with patient? Yes  SHORT TERM GOALS: Target date: 02/26/2024    Pt will be independent with HEP.   Baseline: Goal status: INITIAL  2.  Patient will report max 3/10 back/ pelvic pain Baseline:  Goal status: INITIAL  3.  Patient will be I with healthy bowel PT recommendations Baseline:  Goal status: INITIAL  LONG TERM GOALS: Target date: 07/29/2024   Pt will be I and consistent with her HEP and demonstrate all exercises correctly  Baseline:  Goal status: INITIAL  2.  Pt will have reduced low back pain to max 1/10 in order to be able to do functional activities such as bending, lifting, twisting and walking as needed to be able to take care of her family and participate in job duties.  Baseline:  Goal status: INITIAL  3.  Patient will be able to get off floor without help Baseline:  Goal status: INITIAL  4.  Patient will be able to sit as long as she wants without  increased pain Baseline:  Goal status: INITIAL  5.  Patient will be able to carry her baby up stairs into her house Baseline:  Goal status: INITIAL  PLAN:  PT FREQUENCY: 1-2x/week  PT DURATION: 6 months  PLANNED INTERVENTIONS: 97110-Therapeutic exercises, 97530- Therapeutic activity, [id number]- Neuromuscular re-education, 97535- Self Care, [id number]- Manual therapy, [id number]- Electrical stimulation (manual), [id number]- Ionotophoresis 4mg /ml Dexamethasone , 79439 (1-2  muscles), 20561 (3+ muscles)- Dry Needling, Patient/Family education, Taping, Joint mobilization, Joint manipulation, Spinal manipulation, Spinal mobilization, Scar mobilization, Cryotherapy, Moist heat, and Biofeedback  PLAN FOR NEXT SESSION: assess tolerance to treatment session internal pelvic floor assessment, postpartum strengthening, treat low back pain, work on endurance, hip and core strengthening   Kristeen Sar, PT, DPT 02/18/2024 9:37 AM  "

## 2024-02-19 ENCOUNTER — Ambulatory Visit: Payer: MEDICAID

## 2024-02-19 ENCOUNTER — Other Ambulatory Visit: Payer: Self-pay

## 2024-02-19 VITALS — BP 108/82 | HR 74 | Wt 234.2 lb

## 2024-02-19 DIAGNOSIS — Z3042 Encounter for surveillance of injectable contraceptive: Secondary | ICD-10-CM | POA: Diagnosis not present

## 2024-02-19 MED ORDER — MEDROXYPROGESTERONE ACETATE 150 MG/ML IM SUSY
150.0000 mg | PREFILLED_SYRINGE | Freq: Once | INTRAMUSCULAR | Status: AC
  Administered 2024-02-19: 150 mg via INTRAMUSCULAR

## 2024-02-19 NOTE — Progress Notes (Addendum)
 Melissa Gilmore here for Depo-Provera  Injection. Injection administered without complication. Patient will return in 3 months for next injection between 05/06/24 and 05/20/24. Next annual visit due 09/17/24.    [name], RN 02/19/2024  2:11 PM

## 2024-02-21 ENCOUNTER — Encounter: Payer: Self-pay | Admitting: Physical Therapy

## 2024-02-21 ENCOUNTER — Ambulatory Visit: Payer: MEDICAID | Admitting: Physical Therapy

## 2024-02-21 DIAGNOSIS — R262 Difficulty in walking, not elsewhere classified: Secondary | ICD-10-CM

## 2024-02-21 DIAGNOSIS — G8929 Other chronic pain: Secondary | ICD-10-CM

## 2024-02-21 DIAGNOSIS — M6281 Muscle weakness (generalized): Secondary | ICD-10-CM

## 2024-02-21 DIAGNOSIS — M5441 Lumbago with sciatica, right side: Secondary | ICD-10-CM | POA: Diagnosis not present

## 2024-02-21 NOTE — Therapy (Signed)
 " OUTPATIENT PHYSICAL THERAPY FEMALE PELVIC TREATMENT   Patient Name: Melissa Gilmore MRN: [id number] DOB:[birth year], 29 y.o., female Today's Date: 02/21/2024  END OF SESSION:  PT End of Session - 02/21/24 1343     Visit Number 3    Date for Recertification  07/29/24    Authorization Type [employer] authorized 10 visits 02/12/24-07/28/24 auth#[id number]    Authorization - Visit Number 2    Authorization - Number of Visits 10    PT Start Time 1235    PT Stop Time 1315    PT Time Calculation (min) 40 min    Activity Tolerance Patient tolerated treatment well    Behavior During Therapy [facility] for tasks assessed/performed            Past Medical History:  Diagnosis Date   Acute cholecystitis with chronic cholecystitis 07/16/2021   Acute lumbar back pain 03/18/2022   Anxiety    Childhood asthma    Depression    Major Depression disorder   Diabetes mellitus without complication (HCC)    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HCC)    Dyslipidemia 07/17/2021   Headache    Migraines   History of abnormal uterine bleeding 07/17/2021   Schizophrenia (HCC)    Syncope 03/23/2022   Urticaria    Past Surgical History:  Procedure Laterality Date   CESAREAN SECTION N/A 08/05/2023   Procedure: CESAREAN DELIVERY;  Surgeon: Jayne Vonn DEL, MD;  Location: [facility];  Service: Obstetrics;  Laterality: N/A;   CHOLECYSTECTOMY N/A 07/17/2021   Procedure: SINGLE SITE LAPAROSCOPIC CHOLECYSTECTOMY WITH INTRAOPERATIVE CHOLANGIOGRAM; LIVER BIOPSY;  Surgeon: Sheldon Standing, MD;  Location: [facility];  Service: General;  Laterality: N/A;   IR EPIDUROGRAPHY  03/21/2022   LUMBAR LAMINECTOMY/ DECOMPRESSION WITH MET-RX Right 06/06/2022   Procedure: Right Lumbar three-four Minimally Invasive Surgery Laminectomy/Discectomy;  Surgeon: Cheryle Debby LABOR, MD;  Location: [facility] OR;  Service: Neurosurgery;  Laterality: Right;   WISDOM TOOTH EXTRACTION     Patient Active Problem List   Diagnosis Date Noted    Encounter for surveillance of injectable contraceptive 02/19/2024   Severe sepsis (HCC) 02/08/2024   Pyelonephritis 02/05/2024   Status post primary low transverse cesarean section 08/05/2023   Gestational hypertension 08/03/2023   Uterine contractions during pregnancy 08/02/2023   Elevated blood pressure affecting pregnancy in third trimester, antepartum 08/02/2023   Nausea and vomiting during pregnancy 08/02/2023   Diarrhea during pregnancy 08/02/2023   Type 2 diabetes mellitus during pregnancy 08/02/2023   GBS (group B Streptococcus carrier), +RV culture, currently pregnant 07/23/2023   BMI 40.0-44.9, adult (HCC) 04/09/2023   Abnormal antenatal AFP screen 03/25/2023   Pre-existing type 2 diabetes mellitus in pregnancy 03/25/2023   Family history of spina bifida 02/27/2023   Supervision of high risk pregnancy, antepartum 02/22/2023   Lumbar radiculopathy 06/06/2022   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/2021   Schizophrenia (HCC) 07/17/2021   Depression 07/17/2021   Type 2 diabetes mellitus with complication, with long-term current use of insulin  (HCC) 07/17/2021    PCP: Tanda Bleacher, MD  REFERRING PROVIDER: Jeralyn Crutch, MD  REFERRING DIAG: R10.20,G89.29 (ICD-10-CM) - Chronic pelvic pain in female M79.10 (ICD-10-CM) - Myalgia  THERAPY DIAG:  Chronic right-sided low back pain with right-sided sciatica  Difficulty in walking, not elsewhere classified  Muscle weakness (generalized)  Rationale for Evaluation and Treatment: Rehabilitation  ONSET DATE: 3-4 months ago  SUBJECTIVE:        Patient reports she did well after last session. She is having minimal pain right  now.    From Eval: Patient reports that she has had pelvic pain that started 3-4 months ago. Hurts when she sits, when she is standing and holding baby on hip. She is 6 months PP. Still has back pain. Pain can be up to a 10/10, wraps from lumbar area to the front. Intercourse used to be painful  early postpartum.  Had a back injury and did PT during pregnancy. Feels tired.  Had a C section after 4 days of labor                                                                                                                                                                                     SUBJECTIVE STATEMENT: FUNCTIONAL LIMITATIONS: sitting is hard, carrying baby, cleaning- kneeling, bending forward, cannot get up from the floor  PERTINENT HISTORY:  Medications for current condition: advil  Surgeries: back surgery- patient does not remember exactly April 2024 Other:  Sexual abuse: No  PAIN:  Are you having pain? Yes NPRS scale: up to 10/10 Pain location: External  Pain type: aching Pain description: constant   Aggravating factors: sitting, getting up from sitting, kneeling Relieving factors: advil  sometimes  PRECAUTIONS: None  RED FLAGS: None   WEIGHT BEARING RESTRICTIONS: No  FALLS:  Has patient fallen in last 6 months? No  OCCUPATION: stay at home mom  ACTIVITY LEVEL : active with baby  PLOF: Independent  PATIENT GOALS: to not have back pain and pelvic pain   BOWEL MOVEMENT: constipated sometimes   URINATION: Pain with urination: No Fully empty bladder: Yes:                                           Post-void dribble: No Stream: Strong Urgency: Yes sometimes Frequency:during the day no                                                        Nocturia: No   Leakage: none Pads/briefs: No  INTERCOURSE: no issues   PREGNANCY: Vaginal deliveries 0 Tearing No Episiotomy No C-section deliveries 1 Currently pregnant No  PROLAPSE: None   OBJECTIVE:  Note: Objective measures were completed at Evaluation unless otherwise noted.  DIAGNOSTIC FINDINGS:  Post-void residual: Voiding Cystourethrogram (VCUG):  Ultrasound:   PATIENT SURVEYS:    PFIQ-7: 150 UIQ-7 48 CRAIG -7 33 POPIQ-7 67   COGNITION: Overall cognitive status:  Within  functional limits for tasks assessed     SENSATION: Light touch: Appears intact  LUMBAR SPECIAL TESTS:  Straight leg raise test: Positive for weakness  FUNCTIONAL TESTS:   Single leg stance:pelvic sway bilat  Rt:  Lt: Sit-up test: has to use arms to lift Squat: Bed mobility: aggravating   GAIT: Assistive device utilized: None Comments: antalgic  POSTURE: rounded shoulders and forward head, scoliosis   LUMBARAROM/PROM:  A/PROM A/PROM  Eval (% available)  Flexion 70 irritating  Extension 70  Right lateral flexion 70  Left lateral flexion 70  Right rotation 70  Left rotation 70   (Blank rows = not tested)  LOWER EXTREMITY ROM: full but irritating on right    LOWER EXTREMITY FFU:apojuzmjo hips 4-/5  PALPATION:  General: tight and tender C section scar  Pelvic Alignment: uneven  Abdominal: C section scar  Diastasis: Yes:   Distortion: No  Breathing: upper chest Scar tissue: Yes:   Active Straight Leg Raise: painful on left                 External Perineal Exam: to be assessed                              Internal Pelvic Floor: to be assessed   Patient confirms identification and approves PT to assess internal pelvic floor and treatment Yes All internal or external pelvic floor assessments and/or treatments are completed with proper hand hygiene and gloves hands. If needed gloves are changed with hand hygiene during patient care time.  PELVIC MMT:   MMT eval  Vaginal   Internal Anal Sphincter   External Anal Sphincter   Puborectalis   (Blank rows = not tested)        TONE: to be assessed   PROLAPSE: to be assessed   TODAY'S TREATMENT:                                                                                                                              DATE:   02/21/2024 NuStep Level 3 5 mins- PT present to discuss status 3way stability ball stretch x 8 each direction  Seated piriformis stretch 2 x 30 sec bilateral  Supine LTR x 8 each  5 sec hold Hooklying alt hand and knee press with purple ball x 10 bilateral  Supine bridge + purple ball squeeze 2 x 10 SL clamshell  2 x 10 bilateral with yellow loop Sit to stand 20# KN 2 x 10 Seated hinge with 10# DB x 10 Unilateral 10# DB shoulder height + march x 20 bilateral  Standing row & extension with green TB 2 x 10   02/18/2024 NuStep Level 3 5 mins- PT present to discuss status Seated hip IR/ER x 10 each direction Seated purple ball press 2 x 10 Hooklying PPT 2 x 10 Discussion of kidney infection and events that led up  to it Hooklying alt hand and knee press with purple ball x 10 each direction  Hooklying TA activation + ball squeeze 2 x 10 Hooklying bridge x 10 SL clamshell  2 x 10 bilateral  Supine LTR x 10 each 5 sec hold Hooklying TA activation + hip abduction with yellow loop 2 x 10   01/29/2024 Examination completed, findings reviewed, pt educated on POC, HEP, and female pelvic floor anatomy, reasoning with pelvic floor assessment internally with pt consent. Pt motivated to participate in PT and agreeable to attempt recommendations.       PATIENT EDUCATION:  Education details: Pt was educated on relevant anatomy, exam findings, home exercise program, plan of care, expectations of PT and scar massage  Person educated: Patient Education method: Explanation, Demonstration, Tactile cues, Verbal cues, and Handouts Education comprehension: verbalized understanding, returned demonstration, verbal cues required, tactile cues required, and needs further education  HOME EXERCISE PROGRAM: Access Code: [email or web address] URL: [email or web address] Date: 02/18/2024 Prepared by: Kristeen Sar  Exercises - Supine Bridge  - 1 x daily - 7 x weekly - 1-2 sets - 10 reps - Clamshell  - 1 x daily - 7 x weekly - 2 sets - 10 reps - Supine Lower Trunk Rotation  - 1 x daily - 7 x weekly - 1 sets - 10 reps - 5 hold - Supine Posterior Pelvic Tilt  - 1 x daily - 7 x weekly - 2  sets - 10 reps  Patient Education - Bowel Emptying Techniques - High-Fiber Diet to Support Pelvic Health - Bowel Emptying Techniques - Abdominal Massage for Constipation  ASSESSMENT:  CLINICAL IMPRESSION: Morena verbalized a good response to last treatment session. She verbalized minimal low back pain today. We incorporated postural strengthening today and core exercises. PT provided cues on correct breathing technique while performing exercises. Overall, she tolerated treatment session well and should continue to respond well to skilled therapy. Patient will benefit from skilled PT to address the below impairments and improve overall function.   OBJECTIVE IMPAIRMENTS: decreased activity tolerance, decreased balance, decreased endurance, decreased mobility, difficulty walking, decreased ROM, decreased strength, hypomobility, increased muscle spasms, obesity, and pain.   ACTIVITY LIMITATIONS: lifting, bending, standing, squatting, stairs, bed mobility, toileting, locomotion level, and caring for others  PARTICIPATION LIMITATIONS: community activity and occupation  PERSONAL FACTORS: Age, Fitness, and Time since onset of injury/illness/exacerbation are also affecting patient's functional outcome.   REHAB POTENTIAL: Good  CLINICAL DECISION MAKING: Evolving/moderate complexity  EVALUATION COMPLEXITY: Moderate   GOALS: Goals reviewed with patient? Yes  SHORT TERM GOALS: Target date: 02/26/2024    Pt will be independent with HEP.   Baseline: Goal status: INITIAL  2.  Patient will report max 3/10 back/ pelvic pain Baseline:  Goal status: INITIAL  3.  Patient will be I with healthy bowel PT recommendations Baseline:  Goal status: INITIAL  LONG TERM GOALS: Target date: 07/29/2024   Pt will be I and consistent with her HEP and demonstrate all exercises correctly  Baseline:  Goal status: INITIAL  2.  Pt will have reduced low back pain to max 1/10 in order to be able to do  functional activities such as bending, lifting, twisting and walking as needed to be able to take care of her family and participate in job duties.  Baseline:  Goal status: INITIAL  3.  Patient will be able to get off floor without help Baseline:  Goal status: INITIAL  4.  Patient will be able to sit as long as  she wants without increased pain Baseline:  Goal status: INITIAL  5.  Patient will be able to carry her baby up stairs into her house Baseline:  Goal status: INITIAL  PLAN:  PT FREQUENCY: 1-2x/week  PT DURATION: 6 months  PLANNED INTERVENTIONS: 97110-Therapeutic exercises, 97530- Therapeutic activity, 97112- Neuromuscular re-education, 97535- Self Care, [id number]- Manual therapy, [phone]- Electrical stimulation (manual), [phone]- Ionotophoresis 4mg /ml Dexamethasone , 79439 (1-2 muscles), 20561 (3+ muscles)- Dry Needling, Patient/Family education, Taping, Joint mobilization, Joint manipulation, Spinal manipulation, Spinal mobilization, Scar mobilization, Cryotherapy, Moist heat, and Biofeedback  PLAN FOR NEXT SESSION:unsupported core exercises; posture internal pelvic floor assessment, postpartum strengthening, treat low back pain, work on endurance, hip and core strengthening   Kristeen Sar, PT, DPT 02/21/24 1:44 PM   "

## 2024-02-26 ENCOUNTER — Other Ambulatory Visit: Payer: Self-pay

## 2024-02-26 ENCOUNTER — Ambulatory Visit: Payer: MEDICAID

## 2024-02-26 DIAGNOSIS — M6281 Muscle weakness (generalized): Secondary | ICD-10-CM

## 2024-02-26 DIAGNOSIS — R293 Abnormal posture: Secondary | ICD-10-CM

## 2024-02-26 DIAGNOSIS — M5441 Lumbago with sciatica, right side: Secondary | ICD-10-CM | POA: Diagnosis not present

## 2024-02-26 DIAGNOSIS — R252 Cramp and spasm: Secondary | ICD-10-CM

## 2024-02-26 DIAGNOSIS — G8929 Other chronic pain: Secondary | ICD-10-CM

## 2024-02-26 DIAGNOSIS — R262 Difficulty in walking, not elsewhere classified: Secondary | ICD-10-CM

## 2024-02-26 DIAGNOSIS — R208 Other disturbances of skin sensation: Secondary | ICD-10-CM

## 2024-02-26 NOTE — Therapy (Signed)
 " OUTPATIENT PHYSICAL THERAPY FEMALE PELVIC TREATMENT   Patient Name: Melissa Gilmore MRN: [id number] DOB:22-Dec-1995, 29 y.o., female Today's Date: 02/26/2024  END OF SESSION:  PT End of Session - 02/26/24 1253     Visit Number 4    Date for Recertification  07/29/24    Authorization Type [employer] authorized 10 visits 02/12/24-07/28/24 auth#[id number]    Authorization - Visit Number 4    Authorization - Number of Visits 10    PT Start Time 1230    PT Stop Time 1313    PT Time Calculation (min) 43 min    Activity Tolerance Patient tolerated treatment well    Behavior During Therapy [facility] for tasks assessed/performed            Past Medical History:  Diagnosis Date   Acute cholecystitis with chronic cholecystitis 07/16/2021   Acute lumbar back pain 03/18/2022   Anxiety    Childhood asthma    Depression    Major Depression disorder   Diabetes mellitus without complication (HCC)    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HCC)    Dyslipidemia 07/17/2021   Headache    Migraines   History of abnormal uterine bleeding 07/17/2021   Schizophrenia (HCC)    Syncope 03/23/2022   Urticaria    Past Surgical History:  Procedure Laterality Date   CESAREAN SECTION N/A 08/05/2023   Procedure: CESAREAN DELIVERY;  Surgeon: Jayne Vonn DEL, MD;  Location: [facility] ORS;  Service: Obstetrics;  Laterality: N/A;   CHOLECYSTECTOMY N/A 07/17/2021   Procedure: SINGLE SITE LAPAROSCOPIC CHOLECYSTECTOMY WITH INTRAOPERATIVE CHOLANGIOGRAM; LIVER BIOPSY;  Surgeon: Sheldon Standing, MD;  Location: [facility] ORS;  Service: General;  Laterality: N/A;   IR EPIDUROGRAPHY  03/21/2022   LUMBAR LAMINECTOMY/ DECOMPRESSION WITH MET-RX Right 06/06/2022   Procedure: Right Lumbar three-four Minimally Invasive Surgery Laminectomy/Discectomy;  Surgeon: Cheryle Debby LABOR, MD;  Location: [facility];  Service: Neurosurgery;  Laterality: Right;   WISDOM TOOTH EXTRACTION     Patient Active Problem List   Diagnosis Date Noted    Encounter for surveillance of injectable contraceptive 02/19/2024   Severe sepsis (HCC) 02/08/2024   Pyelonephritis 02/05/2024   Status post primary low transverse cesarean section 08/05/2023   Gestational hypertension 08/03/2023   Uterine contractions during pregnancy 08/02/2023   Elevated blood pressure affecting pregnancy in third trimester, antepartum 08/02/2023   Nausea and vomiting during pregnancy 08/02/2023   Diarrhea during pregnancy 08/02/2023   Type 2 diabetes mellitus during pregnancy 08/02/2023   GBS (group B Streptococcus carrier), +RV culture, currently pregnant 07/23/2023   BMI 40.0-44.9, adult (HCC) 04/09/2023   Abnormal antenatal AFP screen 03/25/2023   Pre-existing type 2 diabetes mellitus in pregnancy 03/25/2023   Family history of spina bifida 02/27/2023   Supervision of high risk pregnancy, antepartum 02/22/2023   Lumbar radiculopathy 06/06/2022   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/2021   Schizophrenia (HCC) 07/17/2021   Depression 07/17/2021   Type 2 diabetes mellitus with complication, with long-term current use of insulin  (HCC) 07/17/2021    PCP: Tanda Bleacher, MD  REFERRING PROVIDER: Jeralyn Crutch, MD  REFERRING DIAG: R10.20,G89.29 (ICD-10-CM) - Chronic pelvic pain in female M79.10 (ICD-10-CM) - Myalgia  THERAPY DIAG:  Chronic right-sided low back pain with right-sided sciatica  Muscle weakness (generalized)  Difficulty in walking, not elsewhere classified  Abnormal posture  Cramp and spasm  Acute midline low back pain with right-sided sciatica  Other disturbances of skin sensation  Rationale for Evaluation and Treatment: Rehabilitation  ONSET DATE: 3-4 months ago  SUBJECTIVE:        Patient reports she is hurting today.  6/10 pain.  I kind of overdid it.  I'm supposed to stop when my pain starts but I wanted to finish what I was doing    From Eval: Patient reports that she has had pelvic pain that started 3-4 months ago.  Hurts when she sits, when she is standing and holding baby on hip. She is 6 months PP. Still has back pain. Pain can be up to a 10/10, wraps from lumbar area to the front. Intercourse used to be painful early postpartum.  Had a back injury and did PT during pregnancy. Feels tired.  Had a C section after 4 days of labor                                                                                                                                                                                     SUBJECTIVE STATEMENT: FUNCTIONAL LIMITATIONS: sitting is hard, carrying baby, cleaning- kneeling, bending forward, cannot get up from the floor  PERTINENT HISTORY:  Medications for current condition: advil  Surgeries: back surgery- patient does not remember exactly April 2024 Other:  Sexual abuse: No  PAIN:  Are you having pain? Yes NPRS scale: up to 10/10 Pain location: External  Pain type: aching Pain description: constant   Aggravating factors: sitting, getting up from sitting, kneeling Relieving factors: advil  sometimes  PRECAUTIONS: None  RED FLAGS: None   WEIGHT BEARING RESTRICTIONS: No  FALLS:  Has patient fallen in last 6 months? No  OCCUPATION: stay at home mom  ACTIVITY LEVEL : active with baby  PLOF: Independent  PATIENT GOALS: to not have back pain and pelvic pain   BOWEL MOVEMENT: constipated sometimes   URINATION: Pain with urination: No Fully empty bladder: Yes:                                           Post-void dribble: No Stream: Strong Urgency: Yes sometimes Frequency:during the day no                                                        Nocturia: No   Leakage: none Pads/briefs: No  INTERCOURSE: no issues   PREGNANCY: Vaginal deliveries 0 Tearing No Episiotomy No C-section deliveries 1 Currently pregnant No  PROLAPSE: None   OBJECTIVE:  Note: Objective measures  were completed at Evaluation unless otherwise noted.  DIAGNOSTIC  FINDINGS:  Post-void residual: Voiding Cystourethrogram (VCUG):  Ultrasound:   PATIENT SURVEYS:    PFIQ-7: 150 UIQ-7 48 CRAIG -7 33 POPIQ-7 67   COGNITION: Overall cognitive status: Within functional limits for tasks assessed     SENSATION: Light touch: Appears intact  LUMBAR SPECIAL TESTS:  Straight leg raise test: Positive for weakness  FUNCTIONAL TESTS:   Single leg stance:pelvic sway bilat  Rt:  Lt: Sit-up test: has to use arms to lift Squat: Bed mobility: aggravating   GAIT: Assistive device utilized: None Comments: antalgic  POSTURE: rounded shoulders and forward head, scoliosis   LUMBARAROM/PROM:  A/PROM A/PROM  Eval (% available)  Flexion 70 irritating  Extension 70  Right lateral flexion 70  Left lateral flexion 70  Right rotation 70  Left rotation 70   (Blank rows = not tested)  LOWER EXTREMITY ROM: full but irritating on right    LOWER EXTREMITY FFU:apojuzmjo hips 4-/5  PALPATION:  General: tight and tender C section scar  Pelvic Alignment: uneven  Abdominal: C section scar  Diastasis: Yes:   Distortion: No  Breathing: upper chest Scar tissue: Yes:   Active Straight Leg Raise: painful on left                 External Perineal Exam: to be assessed                              Internal Pelvic Floor: to be assessed   Patient confirms identification and approves PT to assess internal pelvic floor and treatment Yes All internal or external pelvic floor assessments and/or treatments are completed with proper hand hygiene and gloves hands. If needed gloves are changed with hand hygiene during patient care time.  PELVIC MMT:   MMT eval  Vaginal   Internal Anal Sphincter   External Anal Sphincter   Puborectalis   (Blank rows = not tested)        TONE: to be assessed   PROLAPSE: to be assessed   TODAY'S TREATMENT:                                                                                                                               DATE:   02/26/24 NuStep Level 5 x 5 mins- PT present to discuss status Standing hamstring stretch 3 x 30 sec Standing quad/hip flexor stretch 3 x 30 sec Seated piriformis stretch 3 x 30 sec Supine PPT x 20 PPT with march x 20 PPT  with alternating arm and leg x 20 Open book stretch x 10 each side holding 2-3 sec Quadruped cat/camel x 10 (added childs pose for further stretch as an option for home)  02/21/2024 NuStep Level 3 5 mins- PT present to discuss status 3way stability ball stretch x 8 each direction  Seated piriformis stretch 2 x 30 sec bilateral  Supine LTR x 8 each 5 sec hold Hooklying alt hand and knee press with purple ball x 10 bilateral  Supine bridge + purple ball squeeze 2 x 10 SL clamshell  2 x 10 bilateral with yellow loop Sit to stand 20# KN 2 x 10 Seated hinge with 10# DB x 10 Unilateral 10# DB shoulder height + march x 20 bilateral  Standing row & extension with green TB 2 x 10   02/18/2024 NuStep Level 3 5 mins- PT present to discuss status Seated hip IR/ER x 10 each direction Seated purple ball press 2 x 10 Hooklying PPT 2 x 10 Discussion of kidney infection and events that led up to it Hooklying alt hand and knee press with purple ball x 10 each direction  Hooklying TA activation + ball squeeze 2 x 10 Hooklying bridge x 10 SL clamshell  2 x 10 bilateral  Supine LTR x 10 each 5 sec hold Hooklying TA activation + hip abduction with yellow loop 2 x 10   01/29/2024 Examination completed, findings reviewed, pt educated on POC, HEP, and female pelvic floor anatomy, reasoning with pelvic floor assessment internally with pt consent. Pt motivated to participate in PT and agreeable to attempt recommendations.       PATIENT EDUCATION:  Education details: Pt was educated on relevant anatomy, exam findings, home exercise program, plan of care, expectations of PT and scar massage  Person educated: Patient Education method: Explanation,  Demonstration, Tactile cues, Verbal cues, and Handouts Education comprehension: verbalized understanding, returned demonstration, verbal cues required, tactile cues required, and needs further education  HOME EXERCISE PROGRAM: Access Code: [email or web address] URL: [email or web address] Date: 02/18/2024 Prepared by: Kristeen Sar  Exercises - Supine Bridge  - 1 x daily - 7 x weekly - 1-2 sets - 10 reps - Clamshell  - 1 x daily - 7 x weekly - 2 sets - 10 reps - Supine Lower Trunk Rotation  - 1 x daily - 7 x weekly - 1 sets - 10 reps - 5 hold - Supine Posterior Pelvic Tilt  - 1 x daily - 7 x weekly - 2 sets - 10 reps  Patient Education - Bowel Emptying Techniques - High-Fiber Diet to Support Pelvic Health - Bowel Emptying Techniques - Abdominal Massage for Constipation  ASSESSMENT:  CLINICAL IMPRESSION: Mylie had somewhat of a set back.  She had done some cleaning and was having increased pain today.  She admits that she didn't rest properly when she started hurting.  We added standing stretches for hamstrings and quads.  She was able to complete all stretches and core exercises with no increase in pain today.   Patient will benefit from skilled PT to address the below impairments and improve overall function.   OBJECTIVE IMPAIRMENTS: decreased activity tolerance, decreased balance, decreased endurance, decreased mobility, difficulty walking, decreased ROM, decreased strength, hypomobility, increased muscle spasms, obesity, and pain.   ACTIVITY LIMITATIONS: lifting, bending, standing, squatting, stairs, bed mobility, toileting, locomotion level, and caring for others  PARTICIPATION LIMITATIONS: community activity and occupation  PERSONAL FACTORS: Age, Fitness, and Time since onset of injury/illness/exacerbation are also affecting patient's functional outcome.   REHAB POTENTIAL: Good  CLINICAL DECISION MAKING: Evolving/moderate complexity  EVALUATION COMPLEXITY:  Moderate   GOALS: Goals reviewed with patient? Yes  SHORT TERM GOALS: Target date: 02/26/2024    Pt will be independent with HEP.   Baseline: Goal status: MET 02/26/24  2.  Patient will report max 3/10 back/ pelvic pain Baseline:  Goal status:  In Progress  3.  Patient will be I with healthy bowel PT recommendations Baseline:  Goal status: In Progress  LONG TERM GOALS: Target date: 07/29/2024   Pt will be I and consistent with her HEP and demonstrate all exercises correctly  Baseline:  Goal status: INITIAL  2.  Pt will have reduced low back pain to max 1/10 in order to be able to do functional activities such as bending, lifting, twisting and walking as needed to be able to take care of her family and participate in job duties.  Baseline:  Goal status: INITIAL  3.  Patient will be able to get off floor without help Baseline:  Goal status: INITIAL  4.  Patient will be able to sit as long as she wants without increased pain Baseline:  Goal status: INITIAL  5.  Patient will be able to carry her baby up stairs into her house Baseline:  Goal status: INITIAL  PLAN:  PT FREQUENCY: 1-2x/week  PT DURATION: 6 months  PLANNED INTERVENTIONS: 97110-Therapeutic exercises, 97530- Therapeutic activity, 97112- Neuromuscular re-education, 97535- Self Care, [id number]- Manual therapy, [phone]- Electrical stimulation (manual), [phone]- Ionotophoresis 4mg /ml Dexamethasone , 79439 (1-2 muscles), 20561 (3+ muscles)- Dry Needling, Patient/Family education, Taping, Joint mobilization, Joint manipulation, Spinal manipulation, Spinal mobilization, Scar mobilization, Cryotherapy, Moist heat, and Biofeedback  PLAN FOR NEXT SESSION: progress to unsupported core exercises; posture internal pelvic floor assessment, postpartum strengthening, treat low back pain, work on endurance, hip and core strengthening   Geraldina Parrott B. Morton Simson, PT 02/26/24 1:15 PM [facility] [address]  [address] [place], [place] [address] Phone # [phone] Fax [phone]    "

## 2024-02-28 ENCOUNTER — Ambulatory Visit: Payer: MEDICAID | Admitting: Physical Therapy

## 2024-02-28 ENCOUNTER — Encounter: Payer: Self-pay | Admitting: Physical Therapy

## 2024-02-28 DIAGNOSIS — G8929 Other chronic pain: Secondary | ICD-10-CM

## 2024-02-28 DIAGNOSIS — M6281 Muscle weakness (generalized): Secondary | ICD-10-CM

## 2024-02-28 DIAGNOSIS — M5441 Lumbago with sciatica, right side: Secondary | ICD-10-CM | POA: Diagnosis not present

## 2024-02-28 DIAGNOSIS — R262 Difficulty in walking, not elsewhere classified: Secondary | ICD-10-CM

## 2024-02-28 NOTE — Therapy (Signed)
 " OUTPATIENT PHYSICAL THERAPY FEMALE PELVIC TREATMENT   Patient Name: Melissa Gilmore MRN: [id number] DOB:03/19/1995, 29 y.o., female Today's Date: 02/28/2024  END OF SESSION:  PT End of Session - 02/28/24 1319     Visit Number 5    Date for Recertification  07/29/24    Authorization Type [employer] authorized 10 visits 02/12/24-07/28/24 auth#[id number]    Authorization - Visit Number 5    Authorization - Number of Visits 10    PT Start Time 1231    PT Stop Time 1314    PT Time Calculation (min) 43 min    Activity Tolerance Patient tolerated treatment well    Behavior During Therapy [facility] for tasks assessed/performed             Past Medical History:  Diagnosis Date   Acute cholecystitis with chronic cholecystitis 07/16/2021   Acute lumbar back pain 03/18/2022   Anxiety    Childhood asthma    Depression    Major Depression disorder   Diabetes mellitus without complication [facility])    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HCC)    Dyslipidemia 07/17/2021   Headache    Migraines   History of abnormal uterine bleeding 07/17/2021   Schizophrenia (HCC)    Syncope 03/23/2022   Urticaria    Past Surgical History:  Procedure Laterality Date   CESAREAN SECTION N/A 08/05/2023   Procedure: CESAREAN DELIVERY;  Surgeon: Jayne Vonn DEL, MD;  Location: [facility] ORS;  Service: Obstetrics;  Laterality: N/A;   CHOLECYSTECTOMY N/A 07/17/2021   Procedure: SINGLE SITE LAPAROSCOPIC CHOLECYSTECTOMY WITH INTRAOPERATIVE CHOLANGIOGRAM; LIVER BIOPSY;  Surgeon: Sheldon Standing, MD;  Location: [facility] ORS;  Service: General;  Laterality: N/A;   IR EPIDUROGRAPHY  03/21/2022   LUMBAR LAMINECTOMY/ DECOMPRESSION WITH MET-RX Right 06/06/2022   Procedure: Right Lumbar three-four Minimally Invasive Surgery Laminectomy/Discectomy;  Surgeon: Cheryle Debby LABOR, MD;  Location: [facility] OR;  Service: Neurosurgery;  Laterality: Right;   WISDOM TOOTH EXTRACTION     Patient Active Problem List   Diagnosis Date Noted    Encounter for surveillance of injectable contraceptive 02/19/2024   Severe sepsis (HCC) 02/08/2024   Pyelonephritis 02/05/2024   Status post primary low transverse cesarean section 08/05/2023   Gestational hypertension 08/03/2023   Uterine contractions during pregnancy 08/02/2023   Elevated blood pressure affecting pregnancy in third trimester, antepartum 08/02/2023   Nausea and vomiting during pregnancy 08/02/2023   Diarrhea during pregnancy 08/02/2023   Type 2 diabetes mellitus during pregnancy 08/02/2023   GBS (group B Streptococcus carrier), +RV culture, currently pregnant 07/23/2023   BMI 40.0-44.9, adult (HCC) 04/09/2023   Abnormal antenatal AFP screen 03/25/2023   Pre-existing type 2 diabetes mellitus in pregnancy 03/25/2023   Family history of spina bifida 02/27/2023   Supervision of high risk pregnancy, antepartum 02/22/2023   Lumbar radiculopathy 06/06/2022   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/2021   Schizophrenia (HCC) 07/17/2021   Depression 07/17/2021   Type 2 diabetes mellitus with complication, with long-term current use of insulin  (HCC) 07/17/2021    PCP: Tanda Bleacher, MD  REFERRING PROVIDER: Jeralyn Crutch, MD  REFERRING DIAG: R10.20,G89.29 (ICD-10-CM) - Chronic pelvic pain in female M79.10 (ICD-10-CM) - Myalgia  THERAPY DIAG:  Chronic right-sided low back pain with right-sided sciatica  Muscle weakness (generalized)  Difficulty in walking, not elsewhere classified  Rationale for Evaluation and Treatment: Rehabilitation  ONSET DATE: 3-4 months ago  SUBJECTIVE:        Patient reports increased thigh soreness today. She responded well to last treatment session  and verbalized relief.    From Eval: Patient reports that she has had pelvic pain that started 3-4 months ago. Hurts when she sits, when she is standing and holding baby on hip. She is 6 months PP. Still has back pain. Pain can be up to a 10/10, wraps from lumbar area to the front.  Intercourse used to be painful early postpartum.  Had a back injury and did PT during pregnancy. Feels tired.  Had a C section after 4 days of labor                                                                                                                                                                                     SUBJECTIVE STATEMENT: FUNCTIONAL LIMITATIONS: sitting is hard, carrying baby, cleaning- kneeling, bending forward, cannot get up from the floor  PERTINENT HISTORY:  Medications for current condition: advil  Surgeries: back surgery- patient does not remember exactly April 2024 Other:  Sexual abuse: No  PAIN:  Are you having pain? Yes NPRS scale: up to 10/10 Pain location: External  Pain type: aching Pain description: constant   Aggravating factors: sitting, getting up from sitting, kneeling Relieving factors: advil  sometimes  PRECAUTIONS: None  RED FLAGS: None   WEIGHT BEARING RESTRICTIONS: No  FALLS:  Has patient fallen in last 6 months? No  OCCUPATION: stay at home mom  ACTIVITY LEVEL : active with baby  PLOF: Independent  PATIENT GOALS: to not have back pain and pelvic pain   BOWEL MOVEMENT: constipated sometimes   URINATION: Pain with urination: No Fully empty bladder: Yes:                                           Post-void dribble: No Stream: Strong Urgency: Yes sometimes Frequency:during the day no                                                        Nocturia: No   Leakage: none Pads/briefs: No  INTERCOURSE: no issues   PREGNANCY: Vaginal deliveries 0 Tearing No Episiotomy No C-section deliveries 1 Currently pregnant No  PROLAPSE: None   OBJECTIVE:  Note: Objective measures were completed at Evaluation unless otherwise noted.  DIAGNOSTIC FINDINGS:  Post-void residual: Voiding Cystourethrogram (VCUG):  Ultrasound:   PATIENT SURVEYS:    PFIQ-7: 150 UIQ-7 48 CRAIG -7 33 POPIQ-7  67   COGNITION:  Overall cognitive status: Within functional limits for tasks assessed     SENSATION: Light touch: Appears intact  LUMBAR SPECIAL TESTS:  Straight leg raise test: Positive for weakness  FUNCTIONAL TESTS:   Single leg stance:pelvic sway bilat  Rt:  Lt: Sit-up test: has to use arms to lift Squat: Bed mobility: aggravating   GAIT: Assistive device utilized: None Comments: antalgic  POSTURE: rounded shoulders and forward head, scoliosis   LUMBARAROM/PROM:  A/PROM A/PROM  Eval (% available)  Flexion 70 irritating  Extension 70  Right lateral flexion 70  Left lateral flexion 70  Right rotation 70  Left rotation 70   (Blank rows = not tested)  LOWER EXTREMITY ROM: full but irritating on right    LOWER EXTREMITY FFU:apojuzmjo hips 4-/5  PALPATION:  General: tight and tender C section scar  Pelvic Alignment: uneven  Abdominal: C section scar  Diastasis: Yes:   Distortion: No  Breathing: upper chest Scar tissue: Yes:   Active Straight Leg Raise: painful on left                 External Perineal Exam: to be assessed                              Internal Pelvic Floor: to be assessed   Patient confirms identification and approves PT to assess internal pelvic floor and treatment Yes All internal or external pelvic floor assessments and/or treatments are completed with proper hand hygiene and gloves hands. If needed gloves are changed with hand hygiene during patient care time.  PELVIC MMT:   MMT eval  Vaginal   Internal Anal Sphincter   External Anal Sphincter   Puborectalis   (Blank rows = not tested)        TONE: to be assessed   PROLAPSE: to be assessed   TODAY'S TREATMENT:                                                                                                                              DATE:   02/28/24 NuStep Level 4 x 5 mins- PT present to discuss status Standing hinge x 10 Standing RDL with 20# KB 2 x 8 Sit to  stand with 20# KB 2 x 8 Standing march with shoulder extension with green TB x 20 Standing row with green TB 2 x 10 Squat to clean with 8# DB x 8 bilateral  Pallof press with green TB 2 x 10 bilateral  Unilateral 20# KB farmers carry x 2 laps    02/26/24 NuStep Level 5 x 5 mins- PT present to discuss status Standing hamstring stretch 3 x 30 sec Standing quad/hip flexor stretch 3 x 30 sec Seated piriformis stretch 3 x 30 sec Supine PPT x 20 PPT with march x 20 PPT  with alternating arm and leg x 20 Open book stretch x 10 each side holding 2-3 sec  Quadruped cat/camel x 10 (added childs pose for further stretch as an option for home)  02/21/2024 NuStep Level 3 5 mins- PT present to discuss status 3way stability ball stretch x 8 each direction  Seated piriformis stretch 2 x 30 sec bilateral  Supine LTR x 8 each 5 sec hold Hooklying alt hand and knee press with purple ball x 10 bilateral  Supine bridge + purple ball squeeze 2 x 10 SL clamshell  2 x 10 bilateral with yellow loop Sit to stand 20# KN 2 x 10 Seated hinge with 10# DB x 10 Unilateral 10# DB shoulder height + march x 20 bilateral  Standing row & extension with green TB 2 x 10   02/18/2024 NuStep Level 3 5 mins- PT present to discuss status Seated hip IR/ER x 10 each direction Seated purple ball press 2 x 10 Hooklying PPT 2 x 10 Discussion of kidney infection and events that led up to it Hooklying alt hand and knee press with purple ball x 10 each direction  Hooklying TA activation + ball squeeze 2 x 10 Hooklying bridge x 10 SL clamshell  2 x 10 bilateral  Supine LTR x 10 each 5 sec hold Hooklying TA activation + hip abduction with yellow loop 2 x 10     PATIENT EDUCATION:  Education details: Pt was educated on relevant anatomy, exam findings, home exercise program, plan of care, expectations of PT and scar massage  Person educated: Patient Education method: Explanation, Demonstration, Tactile cues, Verbal cues,  and Handouts Education comprehension: verbalized understanding, returned demonstration, verbal cues required, tactile cues required, and needs further education  HOME EXERCISE PROGRAM: Access Code: [email or web address] URL: [email or web address] Date: 02/18/2024 Prepared by: Kristeen Sar  Exercises - Supine Bridge  - 1 x daily - 7 x weekly - 1-2 sets - 10 reps - Clamshell  - 1 x daily - 7 x weekly - 2 sets - 10 reps - Supine Lower Trunk Rotation  - 1 x daily - 7 x weekly - 1 sets - 10 reps - 5 hold - Supine Posterior Pelvic Tilt  - 1 x daily - 7 x weekly - 2 sets - 10 reps  Patient Education - Bowel Emptying Techniques - High-Fiber Diet to Support Pelvic Health - Bowel Emptying Techniques - Abdominal Massage for Constipation  ASSESSMENT:  CLINICAL IMPRESSION: Jannell verbalized increased leg soreness today after doing a lot of cleaning at home. Educated patient on different postures she can perform when cleaning her tub shower. She normally has increased back pain for a couple days after cleaning her shower. She wanted to focus on lifting today. She required max verbal and visual cues for proper hinging technique. She was challenged with weight used today. Overall, she is progressing well with skilled therapy. Patient will benefit from skilled PT to address the below impairments and improve overall function.    OBJECTIVE IMPAIRMENTS: decreased activity tolerance, decreased balance, decreased endurance, decreased mobility, difficulty walking, decreased ROM, decreased strength, hypomobility, increased muscle spasms, obesity, and pain.   ACTIVITY LIMITATIONS: lifting, bending, standing, squatting, stairs, bed mobility, toileting, locomotion level, and caring for others  PARTICIPATION LIMITATIONS: community activity and occupation  PERSONAL FACTORS: Age, Fitness, and Time since onset of injury/illness/exacerbation are also affecting patient's functional outcome.   REHAB POTENTIAL:  Good  CLINICAL DECISION MAKING: Evolving/moderate complexity  EVALUATION COMPLEXITY: Moderate   GOALS: Goals reviewed with patient? Yes  SHORT TERM GOALS: Target date: 02/26/2024    Pt will be independent with HEP.  Baseline: Goal status: MET 02/26/24  2.  Patient will report max 3/10 back/ pelvic pain Baseline:  Goal status: In Progress  3.  Patient will be I with healthy bowel PT recommendations Baseline:  Goal status: In Progress  LONG TERM GOALS: Target date: 07/29/2024   Pt will be I and consistent with her HEP and demonstrate all exercises correctly  Baseline:  Goal status: INITIAL  2.  Pt will have reduced low back pain to max 1/10 in order to be able to do functional activities such as bending, lifting, twisting and walking as needed to be able to take care of her family and participate in job duties.  Baseline:  Goal status: INITIAL  3.  Patient will be able to get off floor without help Baseline:  Goal status: INITIAL  4.  Patient will be able to sit as long as she wants without increased pain Baseline:  Goal status: INITIAL  5.  Patient will be able to carry her baby up stairs into her house Baseline:  Goal status: INITIAL  PLAN:  PT FREQUENCY: 1-2x/week  PT DURATION: 6 months  PLANNED INTERVENTIONS: 97110-Therapeutic exercises, 97530- Therapeutic activity, 97112- Neuromuscular re-education, 97535- Self Care, 02859- Manual therapy, [phone]- Electrical stimulation (manual), [phone]- Ionotophoresis 4mg /ml Dexamethasone , 79439 (1-2 muscles), 20561 (3+ muscles)- Dry Needling, Patient/Family education, Taping, Joint mobilization, Joint manipulation, Spinal manipulation, Spinal mobilization, Scar mobilization, Cryotherapy, Moist heat, and Biofeedback  PLAN FOR NEXT SESSION: hinging;progress to unsupported core exercises; posture internal pelvic floor assessment, postpartum strengthening, treat low back pain, work on endurance, hip and core  strengthening   Kristeen Sar, PT, DPT 02/28/24 1:20 PM [facility] [address] [place], [place] [address] Phone # [phone] Fax [phone]    "

## 2024-03-04 ENCOUNTER — Ambulatory Visit: Payer: MEDICAID | Admitting: Physical Therapy

## 2024-03-05 ENCOUNTER — Inpatient Hospital Stay: Payer: Self-pay | Admitting: Family Medicine

## 2024-03-06 ENCOUNTER — Ambulatory Visit: Payer: MEDICAID | Admitting: Physical Therapy

## 2024-03-07 ENCOUNTER — Ambulatory Visit: Payer: Self-pay | Admitting: Pharmacist

## 2024-03-10 ENCOUNTER — Telehealth: Payer: Self-pay | Admitting: Pharmacist

## 2024-03-10 ENCOUNTER — Ambulatory Visit: Payer: MEDICAID | Admitting: Pharmacist

## 2024-03-10 ENCOUNTER — Telehealth: Payer: Self-pay | Admitting: Family Medicine

## 2024-03-10 NOTE — Telephone Encounter (Signed)
 Error

## 2024-03-10 NOTE — Telephone Encounter (Signed)
 Contacted pt resch appt!

## 2024-03-11 ENCOUNTER — Ambulatory Visit: Payer: MEDICAID | Admitting: Physical Therapy

## 2024-03-12 NOTE — Therapy (Signed)
 " OUTPATIENT PHYSICAL THERAPY FEMALE PELVIC TREATMENT   Patient Name: Melissa Gilmore MRN: [id number] DOB:1995/06/28, 28 y.o., female Today's Date: 03/13/2024  END OF SESSION:  PT End of Session - 03/13/24 0909     Visit Number 6    Date for Recertification  07/29/24    Authorization Type [employer] authorized 10 visits 02/12/24-07/28/24 auth#[id number]    Authorization - Visit Number 6    Authorization - Number of Visits 10    PT Start Time 0834    PT Stop Time 0927    PT Time Calculation (min) 53 min              Past Medical History:  Diagnosis Date   Acute cholecystitis with chronic cholecystitis 07/16/2021   Acute lumbar back pain 03/18/2022   Anxiety    Childhood asthma    Depression    Major Depression disorder   Diabetes mellitus without complication [facility])    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HCC)    Dyslipidemia 07/17/2021   Headache    Migraines   History of abnormal uterine bleeding 07/17/2021   Schizophrenia (HCC)    Syncope 03/23/2022   Urticaria    Past Surgical History:  Procedure Laterality Date   CESAREAN SECTION N/A 08/05/2023   Procedure: CESAREAN DELIVERY;  Surgeon: Jayne Vonn DEL, MD;  Location: [facility] LD ORS;  Service: Obstetrics;  Laterality: N/A;   CHOLECYSTECTOMY N/A 07/17/2021   Procedure: SINGLE SITE LAPAROSCOPIC CHOLECYSTECTOMY WITH INTRAOPERATIVE CHOLANGIOGRAM; LIVER BIOPSY;  Surgeon: Sheldon Standing, MD;  Location: [facility] ORS;  Service: General;  Laterality: N/A;   IR EPIDUROGRAPHY  03/21/2022   LUMBAR LAMINECTOMY/ DECOMPRESSION WITH MET-RX Right 06/06/2022   Procedure: Right Lumbar three-four Minimally Invasive Surgery Laminectomy/Discectomy;  Surgeon: Cheryle Debby LABOR, MD;  Location: [facility] OR;  Service: Neurosurgery;  Laterality: Right;   WISDOM TOOTH EXTRACTION     Patient Active Problem List   Diagnosis Date Noted   Encounter for surveillance of injectable contraceptive 02/19/2024   Severe sepsis (HCC) 02/08/2024   Pyelonephritis  02/05/2024   Status post primary low transverse cesarean section 08/05/2023   Gestational hypertension 08/03/2023   Uterine contractions during pregnancy 08/02/2023   Elevated blood pressure affecting pregnancy in third trimester, antepartum 08/02/2023   Nausea and vomiting during pregnancy 08/02/2023   Diarrhea during pregnancy 08/02/2023   Type 2 diabetes mellitus during pregnancy 08/02/2023   GBS (group B Streptococcus carrier), +RV culture, currently pregnant 07/23/2023   BMI 40.0-44.9, adult (HCC) 04/09/2023   Abnormal antenatal AFP screen 03/25/2023   Pre-existing type 2 diabetes mellitus in pregnancy 03/25/2023   Family history of spina bifida 02/27/2023   Supervision of high risk pregnancy, antepartum 02/22/2023   Lumbar radiculopathy 06/06/2022   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/2021   Schizophrenia (HCC) 07/17/2021   Depression 07/17/2021   Type 2 diabetes mellitus with complication, with long-term current use of insulin  (HCC) 07/17/2021    PCP: Tanda Bleacher, MD  REFERRING PROVIDER: Jeralyn Crutch, MD  REFERRING DIAG: R10.20,G89.29 (ICD-10-CM) - Chronic pelvic pain in female M79.10 (ICD-10-CM) - Myalgia  THERAPY DIAG:  Chronic right-sided low back pain with right-sided sciatica  Muscle weakness (generalized)  Difficulty in walking, not elsewhere classified  Abnormal posture  Cramp and spasm  Acute midline low back pain with right-sided sciatica  Other disturbances of skin sensation  Rationale for Evaluation and Treatment: Rehabilitation  ONSET DATE: 3-4 months ago  SUBJECTIVE:    Patient reports that she has been feeling ok, sleeping near a heater and  it has felt good. Oh her back, kidneys are feeling better after a course of ATB.  She feels ok today, no pain today.  She is 7 months PP. Has some left hip with prolonged sitting after 30 mins.  Cannot hold pee, has had some leaking when she stands up from sitting Has had a lot of  accidents Has had some pain with intercourse Has not not had a chance to do anything about her C sections scar Not breastfeeding but still producing milk. Has an appt on 2/10 with her Dr         Last visit    Patient reports increased thigh soreness today. She responded well to last treatment session and verbalized relief.    From Eval: Patient reports that she has had pelvic pain that started 3-4 months ago. Hurts when she sits, when she is standing and holding baby on hip. She is 6 months PP. Still has back pain. Pain can be up to a 10/10, wraps from lumbar area to the front. Intercourse used to be painful early postpartum.  Had a back injury and did PT during pregnancy. Feels tired.  Had a C section after 4 days of labor                                                                                                                                                                                     SUBJECTIVE STATEMENT: FUNCTIONAL LIMITATIONS: sitting is hard, carrying baby, cleaning- kneeling, bending forward, cannot get up from the floor  PERTINENT HISTORY:  Medications for current condition: advil  Surgeries: back surgery- patient does not remember exactly April 2024 Other:  Sexual abuse: No  PAIN:  Are you having pain? Yes NPRS scale: up to 10/10 Pain location: External  Pain type: aching Pain description: constant   Aggravating factors: sitting, getting up from sitting, kneeling Relieving factors: advil  sometimes  PRECAUTIONS: None  RED FLAGS: None   WEIGHT BEARING RESTRICTIONS: No  FALLS:  Has patient fallen in last 6 months? No  OCCUPATION: stay at home mom  ACTIVITY LEVEL : active with baby  PLOF: Independent  PATIENT GOALS: to not have back pain and pelvic pain   BOWEL MOVEMENT: constipated sometimes   URINATION: Pain with urination: No Fully empty bladder: Yes:                                           Post-void dribble: No Stream:  Strong Urgency: Yes sometimes Frequency:during the day no  Nocturia: No   Leakage: none Pads/briefs: No  INTERCOURSE: no issues   PREGNANCY: Vaginal deliveries 0 Tearing No Episiotomy No C-section deliveries 1 Currently pregnant No  PROLAPSE: None   OBJECTIVE:  Note: Objective measures were completed at Evaluation unless otherwise noted.  DIAGNOSTIC FINDINGS:  Post-void residual: Voiding Cystourethrogram (VCUG):  Ultrasound:   PATIENT SURVEYS:    PFIQ-7: 150 UIQ-7 48 CRAIG -7 33 POPIQ-7 67   COGNITION: Overall cognitive status: Within functional limits for tasks assessed     SENSATION: Light touch: Appears intact  LUMBAR SPECIAL TESTS:  Straight leg raise test: Positive for weakness  FUNCTIONAL TESTS:   Single leg stance:pelvic sway bilat  Rt:  Lt: Sit-up test: has to use arms to lift Squat: Bed mobility: aggravating   GAIT: Assistive device utilized: None Comments: antalgic  POSTURE: rounded shoulders and forward head, scoliosis   LUMBARAROM/PROM:  A/PROM A/PROM  Eval (% available)  Flexion 70 irritating  Extension 70  Right lateral flexion 70  Left lateral flexion 70  Right rotation 70  Left rotation 70   (Blank rows = not tested)  LOWER EXTREMITY ROM: full but irritating on right    LOWER EXTREMITY FFU:apojuzmjo hips 4-/5  PALPATION:  General: tight and tender C section scar  Pelvic Alignment: uneven  Abdominal: C section scar  Diastasis: Yes:   Distortion: No  Breathing: upper chest Scar tissue: Yes:   Active Straight Leg Raise: painful on left                 External Perineal Exam:  dry tissues                             Internal Pelvic Floor: weakness and tenderness throughout 1st and second layer of muscles   Patient confirms identification and approves PT to assess internal pelvic floor and treatment Yes All internal or external pelvic floor assessments  and/or treatments are completed with proper hand hygiene and gloves hands. If needed gloves are changed with hand hygiene during patient care time.  PELVIC MMT:   MMT eval  Vaginal 2/5  Internal Anal Sphincter   External Anal Sphincter   Puborectalis   (Blank rows = not tested)        TONE: Average-high  PROLAPSE: None noticed in hooklying  TODAY'S TREATMENT:                                                                                                                              DATE:   03/13/2024 Internal pelvic floor assessment Education on C section scar massage, perineal massage, pelvic floor exercise- drops and contraction with sit to stand Samples of moisturizers given Sit to stand with exhale with pelvic floor contraction 10 reps Hip adduction with ball +exhale + horizontal abduction with thera band +pelvic floor lift 20 reps      02/28/24 NuStep Level 4 x 5 mins-  PT present to discuss status Standing hinge x 10 Standing RDL with 20# KB 2 x 8 Sit to stand with 20# KB 2 x 8 Standing march with shoulder extension with green TB x 20 Standing row with green TB 2 x 10 Squat to clean with 8# DB x 8 bilateral  Pallof press with green TB 2 x 10 bilateral  Unilateral 20# KB farmers carry x 2 laps    02/26/24 NuStep Level 5 x 5 mins- PT present to discuss status Standing hamstring stretch 3 x 30 sec Standing quad/hip flexor stretch 3 x 30 sec Seated piriformis stretch 3 x 30 sec Supine PPT x 20 PPT with march x 20 PPT  with alternating arm and leg x 20 Open book stretch x 10 each side holding 2-3 sec Quadruped cat/camel x 10 (added childs pose for further stretch as an option for home)  02/21/2024 NuStep Level 3 5 mins- PT present to discuss status 3way stability ball stretch x 8 each direction  Seated piriformis stretch 2 x 30 sec bilateral  Supine LTR x 8 each 5 sec hold Hooklying alt hand and knee press with purple ball x 10 bilateral  Supine bridge + purple  ball squeeze 2 x 10 SL clamshell  2 x 10 bilateral with yellow loop Sit to stand 20# KN 2 x 10 Seated hinge with 10# DB x 10 Unilateral 10# DB shoulder height + march x 20 bilateral  Standing row & extension with green TB 2 x 10   02/18/2024 NuStep Level 3 5 mins- PT present to discuss status Seated hip IR/ER x 10 each direction Seated purple ball press 2 x 10 Hooklying PPT 2 x 10 Discussion of kidney infection and events that led up to it Hooklying alt hand and knee press with purple ball x 10 each direction  Hooklying TA activation + ball squeeze 2 x 10 Hooklying bridge x 10 SL clamshell  2 x 10 bilateral  Supine LTR x 10 each 5 sec hold Hooklying TA activation + hip abduction with yellow loop 2 x 10     PATIENT EDUCATION:  Education details: Pt was educated on relevant anatomy, exam findings, home exercise program, plan of care, expectations of PT and scar massage  Person educated: Patient Education method: Explanation, Demonstration, Tactile cues, Verbal cues, and Handouts Education comprehension: verbalized understanding, returned demonstration, verbal cues required, tactile cues required, and needs further education  HOME EXERCISE PROGRAM: Access Code: [email or web address] URL: [email or web address] Date: 02/18/2024 Prepared by: Kristeen Sar  Exercises - Supine Bridge  - 1 x daily - 7 x weekly - 1-2 sets - 10 reps - Clamshell  - 1 x daily - 7 x weekly - 2 sets - 10 reps - Supine Lower Trunk Rotation  - 1 x daily - 7 x weekly - 1 sets - 10 reps - 5 hold - Supine Posterior Pelvic Tilt  - 1 x daily - 7 x weekly - 2 sets - 10 reps  Patient Education - Bowel Emptying Techniques - High-Fiber Diet to Support Pelvic Health - Bowel Emptying Techniques - Abdominal Massage for Constipation  ASSESSMENT:  CLINICAL IMPRESSION: Exam findings are notable for, abdominal weakness, pelvic floor muscle weakness and tightness and average to high tone in pelvic floor. External soft  tissues of pelvic floor appear dry. Patient demonstrates tenderness in C section scar. Patient with breath holding tendencies Signs and symptoms are most consistent with deep core coordination issues and pelvic floor muscle/core weakness)  Treatment consisted  of pt education on abdominal scar massage and perineal massage and exercises.Patient educated on the pelvic wand as well.   Patient's quality of life has been affected, patient is frustrated and will benefit from physical therapy to address deficits, reduce pain with intercourse and leaking with sit to stand  and improve quality of life.      OBJECTIVE IMPAIRMENTS: decreased activity tolerance, decreased balance, decreased endurance, decreased mobility, difficulty walking, decreased ROM, decreased strength, hypomobility, increased muscle spasms, obesity, and pain.   ACTIVITY LIMITATIONS: lifting, bending, standing, squatting, stairs, bed mobility, toileting, locomotion level, and caring for others  PARTICIPATION LIMITATIONS: community activity and occupation  PERSONAL FACTORS: Age, Fitness, and Time since onset of injury/illness/exacerbation are also affecting patient's functional outcome.   REHAB POTENTIAL: Good  CLINICAL DECISION MAKING: Evolving/moderate complexity  EVALUATION COMPLEXITY: Moderate   GOALS: Goals reviewed with patient? Yes  SHORT TERM GOALS: Target date: 02/26/2024    Pt will be independent with HEP.   Baseline: Goal status: MET 02/26/24  2.  Patient will report max 3/10 back/ pelvic pain Baseline:  Goal status: In Progress  3.  Patient will be I with healthy bowel PT recommendations Baseline:  Goal status: In Progress  LONG TERM GOALS: Target date: 07/29/2024   Pt will be I and consistent with her HEP and demonstrate all exercises correctly  Baseline:  Goal status: INITIAL  2.  Pt will have reduced low back pain to max 1/10 in order to be able to do functional activities such as bending, lifting,  twisting and walking as needed to be able to take care of her family and participate in job duties.  Baseline:  Goal status: INITIAL  3.  Patient will be able to get off floor without help Baseline:  Goal status: INITIAL  4.  Patient will be able to sit as long as she wants without increased pain Baseline:  Goal status: INITIAL  5.  Patient will be able to carry her baby up stairs into her house Baseline:  Goal status: INITIAL  PLAN:  PT FREQUENCY: 1-2x/week  PT DURATION: 6 months  PLANNED INTERVENTIONS: 97110-Therapeutic exercises, 97530- Therapeutic activity, 97112- Neuromuscular re-education, 97535- Self Care, 02859- Manual therapy, [phone]- Electrical stimulation (manual), [phone]- Ionotophoresis 4mg /ml Dexamethasone , 20560 (1-2 muscles), 20561 (3+ muscles)- Dry Needling, Patient/Family education, Taping, Joint mobilization, Joint manipulation, Spinal manipulation, Spinal mobilization, Scar mobilization, Cryotherapy, Moist heat, and Biofeedback  PLAN FOR NEXT SESSION: hinging;progress to unsupported core exercises; posture internal pelvic floor assessment, postpartum strengthening, treat low back pain, work on endurance, hip and core strengthening   Cori Florida, PT, DPT  [facility] [address], [address] [place], [place] [address] Phone # [phone] Fax [phone]   03/13/24   "

## 2024-03-13 ENCOUNTER — Encounter: Payer: Self-pay | Admitting: Physical Therapy

## 2024-03-13 ENCOUNTER — Ambulatory Visit: Payer: MEDICAID | Admitting: Physical Therapy

## 2024-03-13 DIAGNOSIS — R252 Cramp and spasm: Secondary | ICD-10-CM

## 2024-03-13 DIAGNOSIS — R293 Abnormal posture: Secondary | ICD-10-CM

## 2024-03-13 DIAGNOSIS — G8929 Other chronic pain: Secondary | ICD-10-CM

## 2024-03-13 DIAGNOSIS — R208 Other disturbances of skin sensation: Secondary | ICD-10-CM

## 2024-03-13 DIAGNOSIS — M5441 Lumbago with sciatica, right side: Secondary | ICD-10-CM

## 2024-03-13 DIAGNOSIS — M6281 Muscle weakness (generalized): Secondary | ICD-10-CM

## 2024-03-13 DIAGNOSIS — R262 Difficulty in walking, not elsewhere classified: Secondary | ICD-10-CM

## 2024-03-14 ENCOUNTER — Ambulatory Visit: Payer: MEDICAID | Admitting: Pharmacist

## 2024-03-14 ENCOUNTER — Encounter: Payer: Self-pay | Admitting: Pharmacist

## 2024-03-14 ENCOUNTER — Other Ambulatory Visit: Payer: Self-pay

## 2024-03-14 DIAGNOSIS — Z7985 Long-term (current) use of injectable non-insulin antidiabetic drugs: Secondary | ICD-10-CM

## 2024-03-14 DIAGNOSIS — Z794 Long term (current) use of insulin: Secondary | ICD-10-CM

## 2024-03-14 DIAGNOSIS — E1165 Type 2 diabetes mellitus with hyperglycemia: Secondary | ICD-10-CM

## 2024-03-14 MED ORDER — LANTUS SOLOSTAR 100 UNIT/ML ~~LOC~~ SOPN
30.0000 [IU] | PEN_INJECTOR | Freq: Every day | SUBCUTANEOUS | 2 refills | Status: AC
  Filled 2024-03-14: qty 9, 30d supply, fill #0

## 2024-03-14 NOTE — Progress Notes (Signed)
" ° ° °  S:     No chief complaint on file.  29 y.o. female who presents for diabetes evaluation, education, and management. PMH is significant for T2DM, obesity, depression. Patient was referred and last seen by Primary Care Provider, Dr. Tanda, on 10/05/23.  I saw her on 02/08/2024. I increased Ozempic  to 2 mg weekly and continued Lantus  26 units daily.   Since that appt, she ran out of Ozempic  a couple of weeks ago and has been unable to refill due to the snow. Still taking the Lantus  26 units once daily. Denies any NV, abdominal pain. Denies any changes in vision. Sugars have increased since being without Ozempic .   Family/Social History:  Fhx:  no pertinent positives  Tobacco: never smoker  Alcohol: none reported  Current diabetes medications include: Lantus  26 units daily, Ozempic  2 mg weekly (has not been able to take) Patient reports adherence to taking all medications as prescribed.  Insurance coverage: [employer]  Patient denies hypoglycemic events.  Patient denies nocturia (nighttime urination).  Patient reports rare symptoms of neuropathy (nerve pain). Patient denies visual changes. Patient reports self foot exams.   Patient reported dietary habits:  -Reports decrease in appetite since restarting the Ozempic .   Patient-reported CBG range: 140-250 mg/dL.  O:  Has not used CGM since our last appointment.  Lab Results  Component Value Date   HGBA1C 7.8 (H) 02/06/2024   There were no vitals filed for this visit.  Lipid Panel     Component Value Date/Time   CHOL 170 12/14/2022 1458   TRIG 116 12/14/2022 1458   HDL 41 12/14/2022 1458   CHOLHDL 4.1 12/14/2022 1458   LDLCALC 108 (H) 12/14/2022 1458    Clinical Atherosclerotic Cardiovascular Disease (ASCVD): No  The ASCVD Risk score (Arnett DK, et al., 2019) failed to calculate for the following reasons:   The 2019 ASCVD risk score is only valid for ages 48 to 58   * - Cholesterol units were assumed   Patient is  participating in a Managed Medicaid Plan:  Yes   A/P: Diabetes longstanding currently above goal given A1c. AGP report unavailable - I am coordinating with pharmacy to refill her sensors. We are also refilling Ozempic  and she will restart this. Patient is not currently hypoglycemic but is able to verbalize appropriate hypoglycemia management plan.  -Continue Ozempic  2 mg weekly.  -Increase Lantus  to 30 units daily.  -Patient educated on purpose, proper use, and potential adverse effects of Ozempic .  -Extensively discussed pathophysiology of diabetes, recommended lifestyle interventions, dietary effects on blood sugar control.  -Counseled on s/sx of and management of hypoglycemia.  -Next A1c anticipated 04/2024.   Written patient instructions provided. Patient verbalized understanding of treatment plan.  Total time in face to face counseling 30 minutes.    Follow-up:  Pharmacist in 1 month.  Melissa Gilmore, PharmD, Melissa Gilmore, CPP Clinical Pharmacist [facility] [phone]   "

## 2024-03-18 ENCOUNTER — Inpatient Hospital Stay: Payer: MEDICAID | Admitting: Family Medicine

## 2024-03-21 ENCOUNTER — Ambulatory Visit: Payer: MEDICAID | Admitting: Physical Therapy

## 2024-04-02 ENCOUNTER — Ambulatory Visit: Payer: MEDICAID | Admitting: Physical Therapy

## 2024-04-07 ENCOUNTER — Ambulatory Visit: Payer: MEDICAID | Admitting: Physical Therapy

## 2024-04-10 ENCOUNTER — Ambulatory Visit: Payer: MEDICAID | Admitting: Physical Therapy

## 2024-04-11 ENCOUNTER — Ambulatory Visit: Payer: Self-pay | Admitting: Pharmacist

## 2024-04-14 ENCOUNTER — Ambulatory Visit: Payer: MEDICAID | Admitting: Physical Therapy

## 2024-04-17 ENCOUNTER — Ambulatory Visit: Payer: MEDICAID | Admitting: Physical Therapy

## 2024-04-21 ENCOUNTER — Ambulatory Visit: Payer: MEDICAID | Admitting: Physical Therapy

## 2024-04-28 ENCOUNTER — Ambulatory Visit: Payer: MEDICAID | Admitting: Physical Therapy

## 2024-05-07 ENCOUNTER — Ambulatory Visit: Payer: MEDICAID | Admitting: Internal Medicine

## 2024-05-07 ENCOUNTER — Ambulatory Visit: Payer: Self-pay

## 2024-05-12 IMAGING — CT CT ABD-PELV W/ CM
2 of 4 series · 14 of 46 positions shown, 16 images · IV contrast (OMNIPAQUE 300)
Comparison: CT the abdomen and pelvis 07/16/2021.

CLINICAL DATA: 25-year-old female with history of right upper
quadrant abdominal pain, nausea and vomiting. Status post
cholecystectomy on 07/17/2021.

EXAM:
CT ABDOMEN AND PELVIS WITH CONTRAST
TECHNIQUE: Multidetector CT imaging of the abdomen and pelvis was performed
using the standard protocol following bolus administration of
intravenous contrast.

[Series 2: axial st · axial · 0.68mm/px · z∈[+983,+1438]mm · 11 of 101 slices shown, 13 images]
[im 5/101  soft-tissue]
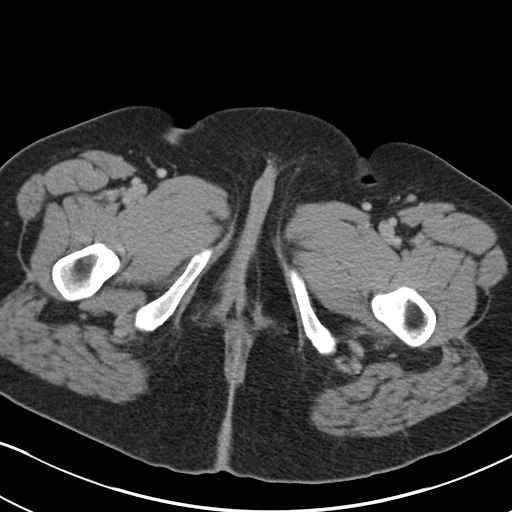
[im 5/101  bone]
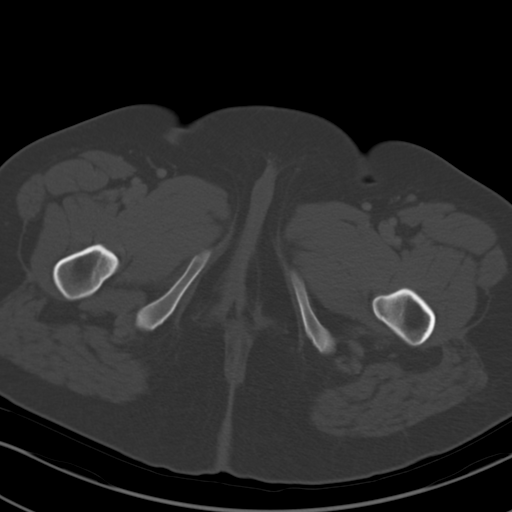
[im 14/101  soft-tissue]
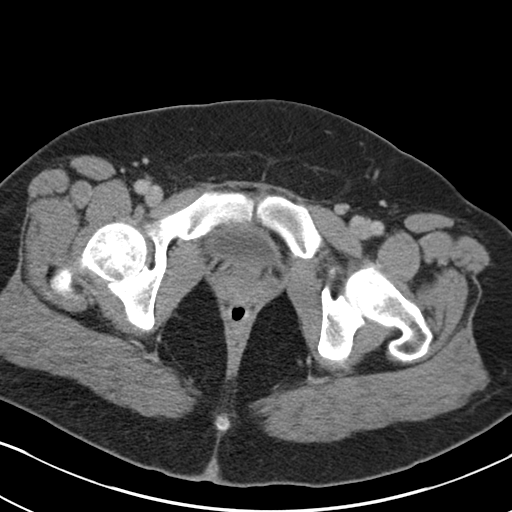
[im 23/101  soft-tissue]
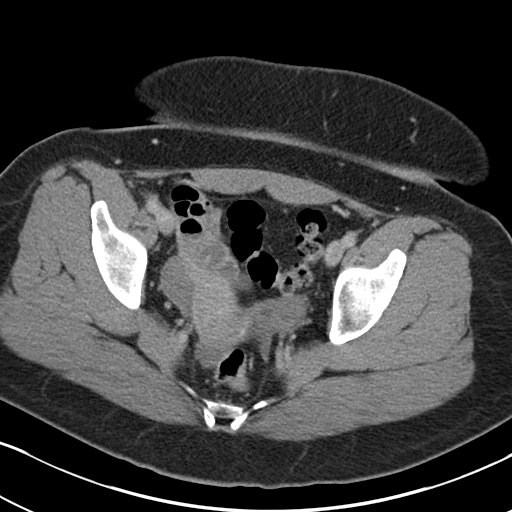
[im 32/101  soft-tissue]
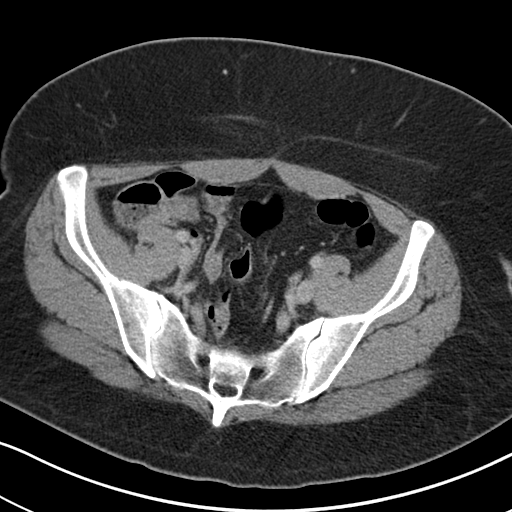
[im 41/101  soft-tissue]
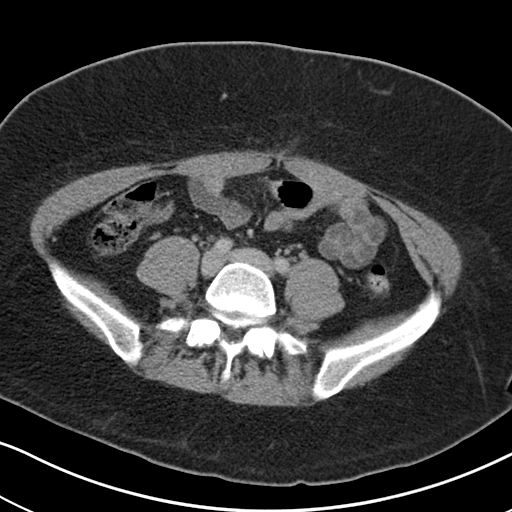
[im 51/101  soft-tissue]
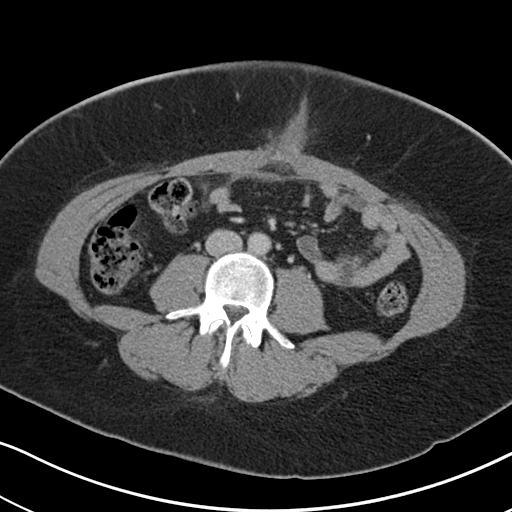
[im 60/101  soft-tissue]
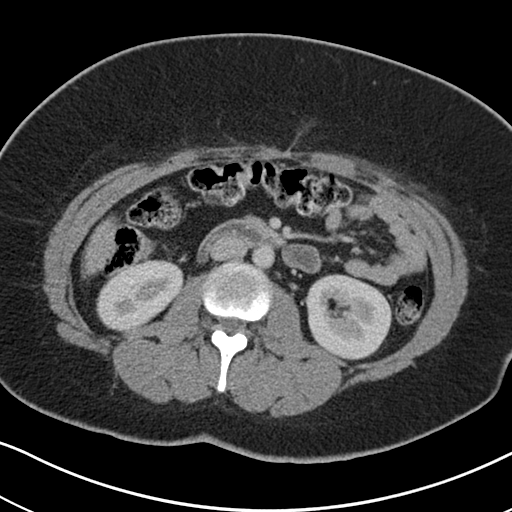
[im 69/101  soft-tissue]
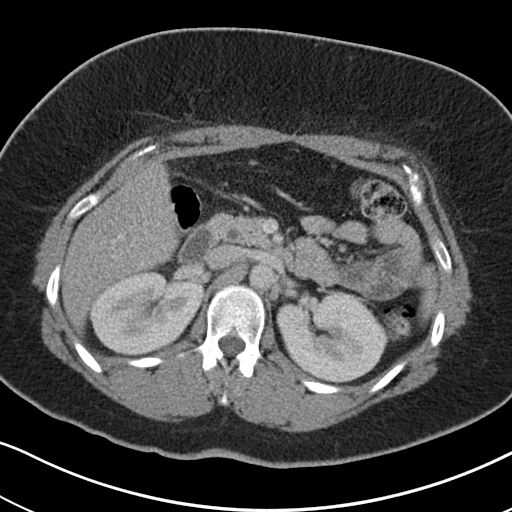
[im 78/101  soft-tissue]
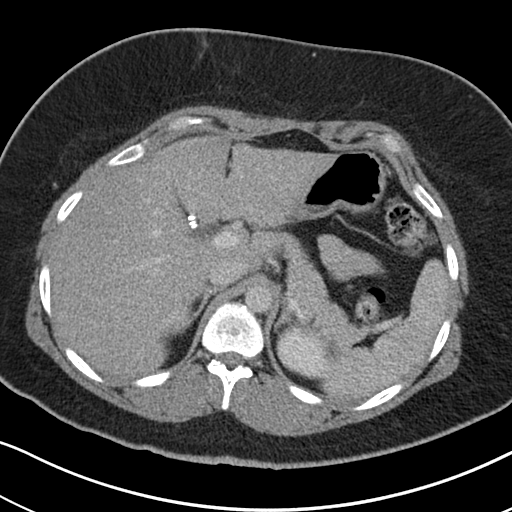
[im 78/101  bone]
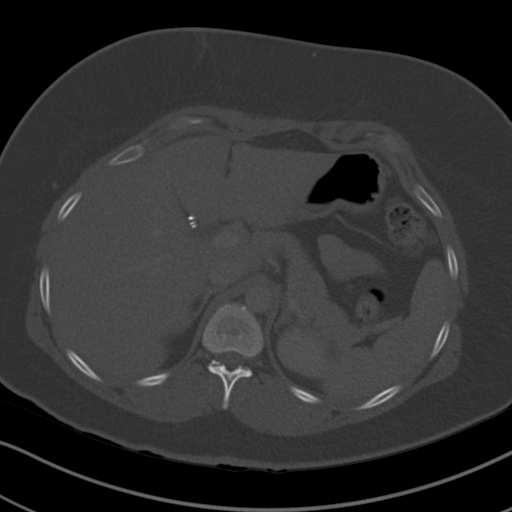
[im 87/101  soft-tissue]
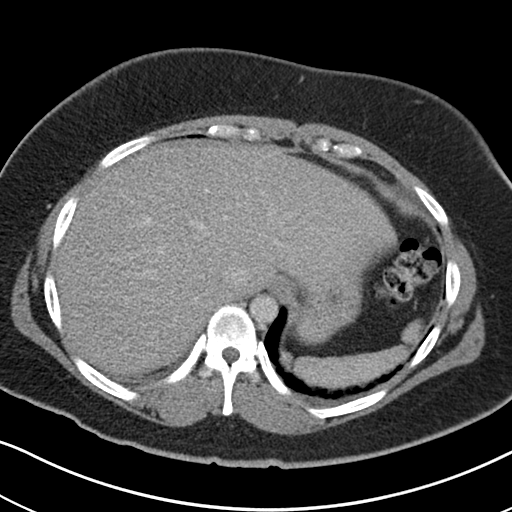
[im 96/101  soft-tissue]
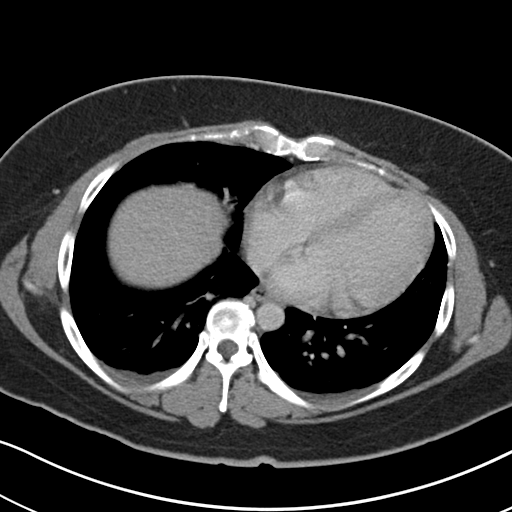

[Series 4: coronal st · coronal · 0.78mm/px · 3 of 151 slices shown]
[im 51/151  soft-tissue]
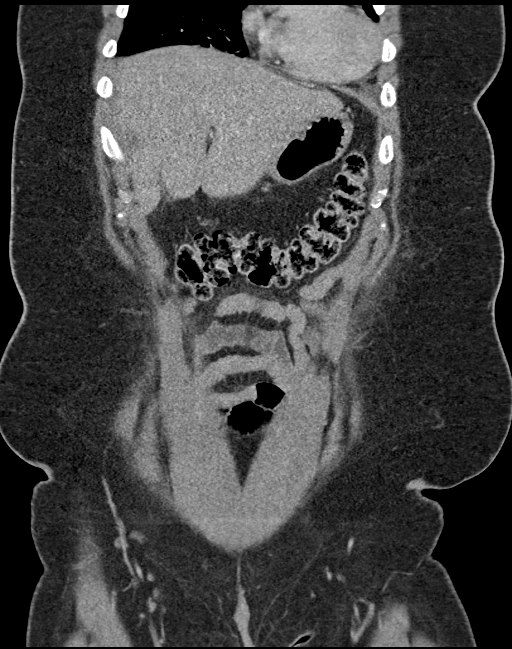
[im 67/151  soft-tissue]
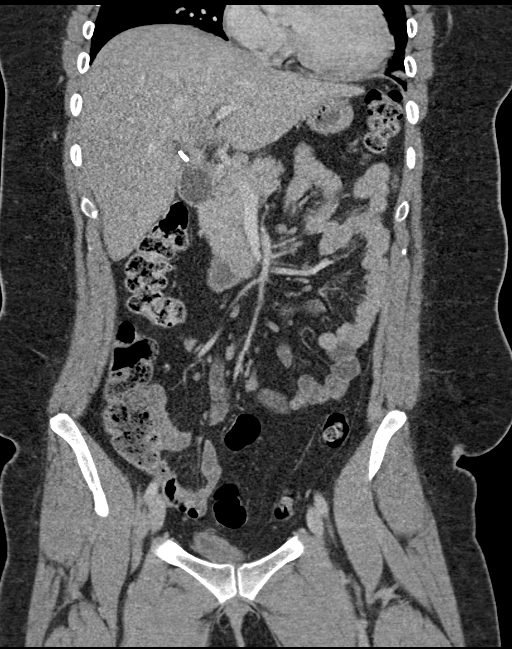
[im 84/151  soft-tissue]
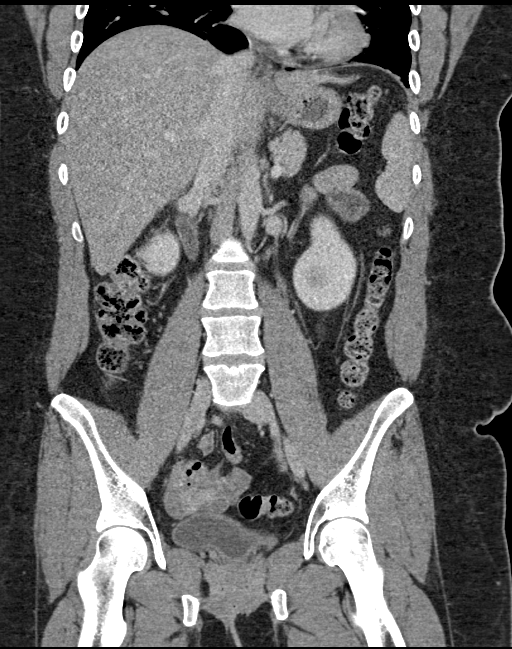

[14 of 46 positions shown; findings below may reference images not displayed]

RADIATION DOSE REDUCTION: This exam was performed according to the
departmental dose-optimization program which includes automated
exposure control, adjustment of the mA and/or kV according to
patient size and/or use of iterative reconstruction technique.

CONTRAST:  100mL OMNIPAQUE IOHEXOL 300 MG/ML  SOLN
FINDINGS: Lower chest: Trace bilateral pleural effusions lying dependently.
Patchy areas of ground-glass attenuation and ill-defined
consolidative opacities along with septal thickening noted in the
lung bases.

Hepatobiliary: There is a scalloped appearance adjacent to segments
4 A and 4B in the liver, the appearance of which is suggestive of a
trace amount of subcapsular fluid which is low-attenuation,
potentially small subcapsular seromas. The largest of these measures
up to 3.1 x 0.7 cm (axial image 18 of series 2). No other suspicious
hepatic lesions. No intra or extrahepatic biliary ductal dilatation.
Status post cholecystectomy. Trace amount of soft tissue stranding
in the fat within the gallbladder fossa, not unexpected given the
recent surgery. No fluid collection in the gallbladder fossa.

Pancreas: No pancreatic mass. No pancreatic ductal dilatation. No
pancreatic or peripancreatic fluid collections or inflammatory
changes.

Spleen: Unremarkable.

Adrenals/Urinary Tract: Bilateral kidneys and adrenal glands are
normal in appearance. No hydroureteronephrosis. Urinary bladder is
normal in appearance.

Stomach/Bowel: The appearance of the stomach is normal. There is no
pathologic dilatation of small bowel or colon. Normal appendix.

Vascular/Lymphatic: No significant atherosclerotic disease, aneurysm
or dissection noted in the abdominal or pelvic vasculature. No
lymphadenopathy noted in the abdomen or pelvis.

Reproductive: Uterus and ovaries are unremarkable in appearance.

Other: Trace volume of free fluid in the cul-de-sac, likely
physiologic in this young female patient. No larger volume of
ascites. No pneumoperitoneum.

Musculoskeletal: Small amount of soft tissue thickening and haziness
in the adjacent subcutaneous fat in and around the umbilicus,
presumably related to recent laparoscopy port access. No
well-defined fluid collection in this region to suggest
postoperative abscess. There are no aggressive appearing lytic or
blastic lesions noted in the visualized portions of the skeleton.
IMPRESSION: 1. Postoperative findings of recent cholecystectomy. No unexpected
postoperative fluid collection in the gallbladder fossa. However,
there does appear to be a small amount of postoperative fluid in the
subcapsular aspect of segments 4A and 4B of the liver. This may
represent some small postoperative subcapsular seromas, however, the
sterility of these collections can not be ascertained on today's
examination and clinical correlation for signs and symptoms of
infection is recommended, as the possibility of early subcapsular
abscesses is not excluded.
2. Trace bilateral pleural effusions lying dependently. There also
patchy opacities in the lung bases, which likely largely reflect
resolving postoperative atelectasis, although mild aspiration
pneumonitis could have a similar appearance.
3. Trace volume of free fluid in the cul-de-sac, presumably
physiologic in this young female patient.
